# Patient Record
Sex: Male | Born: 1975
Health system: Southern US, Community
[De-identification: ages and names within clinical notes are randomized; demographics above are authoritative.]

## PROBLEM LIST (undated history)

## (undated) ENCOUNTER — Emergency Department (HOSPITAL_COMMUNITY): Admission: EM | Payer: Self-pay | Source: Home / Self Care

## (undated) DIAGNOSIS — J111 Influenza due to unidentified influenza virus with other respiratory manifestations: Secondary | ICD-10-CM

## (undated) DIAGNOSIS — I5022 Chronic systolic (congestive) heart failure: Secondary | ICD-10-CM

## (undated) DIAGNOSIS — I509 Heart failure, unspecified: Secondary | ICD-10-CM

## (undated) DIAGNOSIS — B20 Human immunodeficiency virus [HIV] disease: Secondary | ICD-10-CM

## (undated) DIAGNOSIS — E119 Type 2 diabetes mellitus without complications: Secondary | ICD-10-CM

## (undated) DIAGNOSIS — I10 Essential (primary) hypertension: Secondary | ICD-10-CM

## (undated) HISTORY — PX: ANKLE SURGERY: SHX546

---

## 1999-05-13 ENCOUNTER — Emergency Department (HOSPITAL_COMMUNITY): Admission: EM | Admit: 1999-05-13 | Discharge: 1999-05-13 | Payer: Self-pay | Admitting: Emergency Medicine

## 1999-05-14 ENCOUNTER — Encounter: Payer: Self-pay | Admitting: Emergency Medicine

## 2013-12-06 ENCOUNTER — Emergency Department (HOSPITAL_COMMUNITY): Payer: Self-pay

## 2013-12-06 ENCOUNTER — Encounter (HOSPITAL_COMMUNITY): Payer: Self-pay | Admitting: Emergency Medicine

## 2013-12-06 ENCOUNTER — Inpatient Hospital Stay (HOSPITAL_COMMUNITY)
Admission: EM | Admit: 2013-12-06 | Discharge: 2013-12-10 | DRG: 292 | Disposition: A | Discharge status: Home / Self Care | Payer: Self-pay | Attending: Family Medicine | Admitting: Family Medicine

## 2013-12-06 DIAGNOSIS — E1169 Type 2 diabetes mellitus with other specified complication: Secondary | ICD-10-CM

## 2013-12-06 DIAGNOSIS — R0902 Hypoxemia: Secondary | ICD-10-CM | POA: Diagnosis present

## 2013-12-06 DIAGNOSIS — I5023 Acute on chronic systolic (congestive) heart failure: Principal | ICD-10-CM

## 2013-12-06 DIAGNOSIS — Z21 Asymptomatic human immunodeficiency virus [HIV] infection status: Secondary | ICD-10-CM | POA: Diagnosis present

## 2013-12-06 DIAGNOSIS — Z5900 Homelessness unspecified: Secondary | ICD-10-CM

## 2013-12-06 DIAGNOSIS — IMO0001 Reserved for inherently not codable concepts without codable children: Secondary | ICD-10-CM | POA: Diagnosis present

## 2013-12-06 DIAGNOSIS — Z7982 Long term (current) use of aspirin: Secondary | ICD-10-CM

## 2013-12-06 DIAGNOSIS — J40 Bronchitis, not specified as acute or chronic: Secondary | ICD-10-CM | POA: Diagnosis present

## 2013-12-06 DIAGNOSIS — Z8679 Personal history of other diseases of the circulatory system: Secondary | ICD-10-CM

## 2013-12-06 DIAGNOSIS — E1165 Type 2 diabetes mellitus with hyperglycemia: Secondary | ICD-10-CM

## 2013-12-06 DIAGNOSIS — I517 Cardiomegaly: Secondary | ICD-10-CM | POA: Diagnosis present

## 2013-12-06 DIAGNOSIS — B20 Human immunodeficiency virus [HIV] disease: Secondary | ICD-10-CM | POA: Diagnosis present

## 2013-12-06 DIAGNOSIS — J984 Other disorders of lung: Secondary | ICD-10-CM | POA: Diagnosis present

## 2013-12-06 DIAGNOSIS — J189 Pneumonia, unspecified organism: Secondary | ICD-10-CM | POA: Diagnosis present

## 2013-12-06 DIAGNOSIS — R0603 Acute respiratory distress: Secondary | ICD-10-CM | POA: Diagnosis present

## 2013-12-06 DIAGNOSIS — I10 Essential (primary) hypertension: Secondary | ICD-10-CM | POA: Diagnosis present

## 2013-12-06 DIAGNOSIS — E119 Type 2 diabetes mellitus without complications: Secondary | ICD-10-CM | POA: Diagnosis present

## 2013-12-06 DIAGNOSIS — F1210 Cannabis abuse, uncomplicated: Secondary | ICD-10-CM | POA: Diagnosis present

## 2013-12-06 DIAGNOSIS — F17200 Nicotine dependence, unspecified, uncomplicated: Secondary | ICD-10-CM | POA: Diagnosis present

## 2013-12-06 DIAGNOSIS — I428 Other cardiomyopathies: Secondary | ICD-10-CM | POA: Diagnosis present

## 2013-12-06 DIAGNOSIS — I509 Heart failure, unspecified: Secondary | ICD-10-CM | POA: Diagnosis present

## 2013-12-06 HISTORY — DX: Essential (primary) hypertension: I10

## 2013-12-06 HISTORY — DX: Heart failure, unspecified: I50.9

## 2013-12-06 HISTORY — DX: Type 2 diabetes mellitus without complications: E11.9

## 2013-12-06 HISTORY — DX: Human immunodeficiency virus (HIV) disease: B20

## 2013-12-06 HISTORY — DX: Chronic systolic (congestive) heart failure: I50.22

## 2013-12-06 LAB — BLOOD GAS, ARTERIAL
ACID-BASE EXCESS: 0.6 mmol/L (ref 0.0–2.0)
Bicarbonate: 23.7 mEq/L (ref 20.0–24.0)
Drawn by: 40662
O2 Saturation: 99.6 %
PCO2 ART: 31.2 mmHg — AB (ref 35.0–45.0)
PO2 ART: 165 mmHg — AB (ref 80.0–100.0)
Patient temperature: 98.6
TCO2: 24.6 mmol/L (ref 0–100)
pH, Arterial: 7.492 — ABNORMAL HIGH (ref 7.350–7.450)

## 2013-12-06 LAB — GLUCOSE, CAPILLARY
Glucose-Capillary: 279 mg/dL — ABNORMAL HIGH (ref 70–99)
Glucose-Capillary: 170 mg/dL — ABNORMAL HIGH (ref 70–99)

## 2013-12-06 LAB — URINALYSIS, ROUTINE W REFLEX MICROSCOPIC
Bilirubin Urine: NEGATIVE
Hgb urine dipstick: NEGATIVE
Ketones, ur: 15 mg/dL — AB
Leukocytes, UA: NEGATIVE
Nitrite: NEGATIVE
SPECIFIC GRAVITY, URINE: 1.022 (ref 1.005–1.030)
Urobilinogen, UA: 1 mg/dL (ref 0.0–1.0)
pH: 7.5 (ref 5.0–8.0)

## 2013-12-06 LAB — TROPONIN I: Troponin I: <0.3 ng/mL (ref <0.30)

## 2013-12-06 LAB — COMPREHENSIVE METABOLIC PANEL
ALT: 47 U/L (ref 0–53)
AST: 48 U/L — ABNORMAL HIGH (ref 0–37)
Albumin: 3.3 g/dL — ABNORMAL LOW (ref 3.5–5.2)
Alkaline Phosphatase: 82 U/L (ref 39–117)
Anion gap: 14 (ref 5–15)
BUN: 7 mg/dL (ref 6–23)
CALCIUM: 8.7 mg/dL (ref 8.4–10.5)
CO2: 26 meq/L (ref 19–32)
Chloride: 96 mEq/L (ref 96–112)
Creatinine, Ser: 0.82 mg/dL (ref 0.50–1.35)
GFR calc non Af Amer: >90 mL/min (ref >90)
GLUCOSE: 202 mg/dL — AB (ref 70–99)
Potassium: 4.3 mEq/L (ref 3.7–5.3)
SODIUM: 136 meq/L — AB (ref 137–147)
Total Bilirubin: 0.5 mg/dL (ref 0.3–1.2)
Total Protein: 7.9 g/dL (ref 6.0–8.3)

## 2013-12-06 LAB — CBC WITH DIFFERENTIAL/PLATELET
Basophils Absolute: 0 10*3/uL (ref 0.0–0.1)
Basophils Relative: 0 % (ref 0–1)
EOS PCT: 0 % (ref 0–5)
Eosinophils Absolute: 0 10*3/uL (ref 0.0–0.7)
HEMATOCRIT: 38.7 % — AB (ref 39.0–52.0)
Hemoglobin: 13.5 g/dL (ref 13.0–17.0)
LYMPHS PCT: 13 % (ref 12–46)
Lymphs Abs: 0.7 10*3/uL (ref 0.7–4.0)
MCH: 33 pg (ref 26.0–34.0)
MCHC: 34.9 g/dL (ref 30.0–36.0)
MCV: 94.6 fL (ref 78.0–100.0)
MONO ABS: 0.6 10*3/uL (ref 0.1–1.0)
Monocytes Relative: 10 % (ref 3–12)
Neutro Abs: 4.4 10*3/uL (ref 1.7–7.7)
Neutrophils Relative %: 77 % (ref 43–77)
PLATELETS: 134 10*3/uL — AB (ref 150–400)
RBC: 4.09 MIL/uL — ABNORMAL LOW (ref 4.22–5.81)
RDW: 12.5 % (ref 11.5–15.5)
WBC: 5.7 10*3/uL (ref 4.0–10.5)

## 2013-12-06 LAB — URINE MICROSCOPIC-ADD ON

## 2013-12-06 LAB — INFLUENZA PANEL BY PCR (TYPE A & B)
H1N1 flu by pcr: NOT DETECTED
INFLAPCR: NEGATIVE
INFLBPCR: NEGATIVE

## 2013-12-06 LAB — PRO B NATRIURETIC PEPTIDE: Pro B Natriuretic peptide (BNP): 1289 pg/mL — ABNORMAL HIGH (ref 0–125)

## 2013-12-06 LAB — STREP PNEUMONIAE URINARY ANTIGEN: STREP PNEUMO URINARY ANTIGEN: NEGATIVE

## 2013-12-06 LAB — APTT: aPTT: 31 seconds (ref 24–37)

## 2013-12-06 LAB — I-STAT TROPONIN, ED: TROPONIN I, POC: 0.02 ng/mL (ref 0.00–0.08)

## 2013-12-06 LAB — I-STAT CG4 LACTIC ACID, ED: Lactic Acid, Venous: 1.95 mmol/L (ref 0.5–2.2)

## 2013-12-06 LAB — EXPECTORATED SPUTUM ASSESSMENT W GRAM STAIN, RFLX TO RESP C

## 2013-12-06 LAB — MAGNESIUM: MAGNESIUM: 1.3 mg/dL — AB (ref 1.5–2.5)

## 2013-12-06 LAB — PROTIME-INR
INR: 1.05 (ref 0.00–1.49)
Prothrombin Time: 13.7 seconds (ref 11.6–15.2)

## 2013-12-06 MED ORDER — INSULIN ASPART 100 UNIT/ML ~~LOC~~ SOLN
0.0000 [IU] | Freq: Three times a day (TID) | SUBCUTANEOUS | Status: DC
  Administered 2013-12-06: 8 [IU] via SUBCUTANEOUS
  Administered 2013-12-07 (×2): 5 [IU] via SUBCUTANEOUS
  Administered 2013-12-07: 8 [IU] via SUBCUTANEOUS
  Administered 2013-12-08: 5 [IU] via SUBCUTANEOUS
  Administered 2013-12-08: 8 [IU] via SUBCUTANEOUS
  Administered 2013-12-08: 5 [IU] via SUBCUTANEOUS
  Administered 2013-12-09: 3 [IU] via SUBCUTANEOUS
  Administered 2013-12-09: 5 [IU] via SUBCUTANEOUS
  Administered 2013-12-09: 11 [IU] via SUBCUTANEOUS
  Administered 2013-12-10: 3 [IU] via SUBCUTANEOUS
  Administered 2013-12-10: 5 [IU] via SUBCUTANEOUS

## 2013-12-06 MED ORDER — GUAIFENESIN ER 600 MG PO TB12
1200.0000 mg | ORAL_TABLET | Freq: Two times a day (BID) | ORAL | Status: DC
  Administered 2013-12-06 – 2013-12-10 (×8): 1200 mg via ORAL
  Filled 2013-12-06 (×10): qty 2

## 2013-12-06 MED ORDER — SODIUM CHLORIDE 0.9 % IV SOLN
INTRAVENOUS | Status: AC
  Administered 2013-12-06: 13:00:00 via INTRAVENOUS
  Administered 2013-12-07: 1000 mL via INTRAVENOUS

## 2013-12-06 MED ORDER — SODIUM CHLORIDE 0.9 % IV SOLN: Freq: Once | INTRAVENOUS | Status: DC

## 2013-12-06 MED ORDER — SODIUM CHLORIDE 0.9 % IV SOLN
Freq: Once | INTRAVENOUS | Status: AC
  Administered 2013-12-06: 12:00:00 via INTRAVENOUS

## 2013-12-06 MED ORDER — DEXTROSE 5 % IV SOLN
1.0000 g | Freq: Once | INTRAVENOUS | Status: AC
  Administered 2013-12-06: 1 g via INTRAVENOUS
  Filled 2013-12-06: qty 10

## 2013-12-06 MED ORDER — DEXTROSE 5 % IV SOLN
500.0000 mg | INTRAVENOUS | Status: DC
  Administered 2013-12-07: 500 mg via INTRAVENOUS
  Filled 2013-12-06: qty 500

## 2013-12-06 MED ORDER — LABETALOL HCL 5 MG/ML IV SOLN
20.0000 mg | Freq: Once | INTRAVENOUS | Status: AC
  Administered 2013-12-06: 20 mg via INTRAVENOUS
  Filled 2013-12-06: qty 4

## 2013-12-06 MED ORDER — HYDROCHLOROTHIAZIDE 25 MG PO TABS
25.0000 mg | ORAL_TABLET | Freq: Every day | ORAL | Status: DC
  Administered 2013-12-06: 25 mg via ORAL
  Filled 2013-12-06 (×2): qty 1

## 2013-12-06 MED ORDER — DEXTROSE 5 % IV SOLN
500.0000 mg | Freq: Once | INTRAVENOUS | Status: AC
  Administered 2013-12-06: 500 mg via INTRAVENOUS
  Filled 2013-12-06: qty 500

## 2013-12-06 MED ORDER — SODIUM CHLORIDE 0.9 % IV BOLUS (SEPSIS): 500.0000 mL | Freq: Once | INTRAVENOUS | Status: DC

## 2013-12-06 MED ORDER — IPRATROPIUM-ALBUTEROL 0.5-2.5 (3) MG/3ML IN SOLN: 3.0000 mL | Freq: Four times a day (QID) | RESPIRATORY_TRACT | Status: DC

## 2013-12-06 MED ORDER — DEXTROSE 5 % IV SOLN
1.0000 g | INTRAVENOUS | Status: DC
  Administered 2013-12-07: 1 g via INTRAVENOUS
  Filled 2013-12-06: qty 10

## 2013-12-06 MED ORDER — CETYLPYRIDINIUM CHLORIDE 0.05 % MT LIQD
7.0000 mL | Freq: Two times a day (BID) | OROMUCOSAL | Status: DC
  Administered 2013-12-06 – 2013-12-09 (×6): 7 mL via OROMUCOSAL

## 2013-12-06 MED ORDER — ACETAMINOPHEN 325 MG PO TABS: 650.0000 mg | ORAL_TABLET | Freq: Four times a day (QID) | ORAL | Status: DC | PRN

## 2013-12-06 MED ORDER — FAMOTIDINE 20 MG PO TABS
20.0000 mg | ORAL_TABLET | Freq: Every day | ORAL | Status: DC
  Administered 2013-12-06 – 2013-12-10 (×5): 20 mg via ORAL
  Filled 2013-12-06 (×5): qty 1

## 2013-12-06 MED ORDER — ONDANSETRON HCL 4 MG/2ML IJ SOLN: 4.0000 mg | Freq: Four times a day (QID) | INTRAMUSCULAR | Status: DC | PRN

## 2013-12-06 MED ORDER — GI COCKTAIL ~~LOC~~
30.0000 mL | Freq: Three times a day (TID) | ORAL | Status: DC | PRN
  Filled 2013-12-06: qty 30

## 2013-12-06 MED ORDER — IPRATROPIUM-ALBUTEROL 0.5-2.5 (3) MG/3ML IN SOLN
3.0000 mL | RESPIRATORY_TRACT | Status: DC | PRN
  Administered 2013-12-06: 3 mL via RESPIRATORY_TRACT
  Filled 2013-12-06: qty 3

## 2013-12-06 MED ORDER — HYDRALAZINE HCL 20 MG/ML IJ SOLN
10.0000 mg | Freq: Four times a day (QID) | INTRAMUSCULAR | Status: DC | PRN
  Administered 2013-12-06: 10 mg via INTRAVENOUS
  Filled 2013-12-06: qty 1

## 2013-12-06 MED ORDER — SULFAMETHOXAZOLE-TRIMETHOPRIM 400-80 MG/5ML IV SOLN
480.0000 mg | Freq: Three times a day (TID) | INTRAVENOUS | Status: DC
  Administered 2013-12-06 – 2013-12-07 (×3): 480 mg via INTRAVENOUS
  Filled 2013-12-06 (×7): qty 30

## 2013-12-06 MED ORDER — ENOXAPARIN SODIUM 40 MG/0.4ML ~~LOC~~ SOLN
40.0000 mg | SUBCUTANEOUS | Status: DC
  Administered 2013-12-06 – 2013-12-09 (×4): 40 mg via SUBCUTANEOUS
  Filled 2013-12-06 (×5): qty 0.4

## 2013-12-06 MED ORDER — MAGNESIUM SULFATE 4000MG/100ML IJ SOLN
4.0000 g | Freq: Once | INTRAMUSCULAR | Status: AC
  Administered 2013-12-06: 4 g via INTRAVENOUS
  Filled 2013-12-06: qty 100

## 2013-12-06 MED ORDER — SODIUM CHLORIDE 0.9 % IV BOLUS (SEPSIS)
500.0000 mL | Freq: Once | INTRAVENOUS | Status: AC
  Administered 2013-12-06: 500 mL via INTRAVENOUS

## 2013-12-06 MED ORDER — METOPROLOL TARTRATE 25 MG PO TABS
25.0000 mg | ORAL_TABLET | Freq: Two times a day (BID) | ORAL | Status: DC
  Administered 2013-12-06 – 2013-12-09 (×6): 25 mg via ORAL
  Filled 2013-12-06 (×7): qty 1

## 2013-12-06 MED ORDER — PREDNISONE 20 MG PO TABS
40.0000 mg | ORAL_TABLET | Freq: Two times a day (BID) | ORAL | Status: DC
  Administered 2013-12-06 – 2013-12-07 (×3): 40 mg via ORAL
  Filled 2013-12-06 (×6): qty 2

## 2013-12-06 NOTE — Progress Notes (Signed)
9935 In no acute distress. Dozing . Denied SOB

## 2013-12-06 NOTE — ED Provider Notes (Signed)
CSN: [id number]     Arrival date & time 12/06/13  1751 History   First MD Initiated Contact with Patient 12/06/13 [phone]     Chief Complaint  Patient presents with  . Cough  . Chest Pain      HPI  Patient presents for evaluation of difficulty breathing. Has a history that he recently moved here from [place]. He is homeless. Has a friend that accompanies him here. Staying in a shelter. He's had a cough productive of some bloody sputum for 4-5 days. More short of breath today. States he always has "a little bit" of edema in his legs. History of being HIV positive. States he's never been treated or medicated for this. Also history of hypertension and denies any current medications for this as well.   Past Medical History  Diagnosis Date  . HIV (human immunodeficiency virus infection)   . Diabetes mellitus without complication   . Hypertension   . Chronic systolic heart failure   . DM2 (diabetes mellitus, type 2)     a. EF 15-20%, grade II DD, LA mild/mod dilated   Past Surgical History  Procedure Laterality Date  . Ankle surgery Right    Family History  Problem Relation Age of Onset  . Other Father    History  Substance Use Topics  . Smoking status: Current Every Day Smoker -- 0.25 packs/day    Types: Cigarettes  . Smokeless tobacco: Not on file  . Alcohol Use: Yes     Comment: socially about 12 pack a weekend    Review of Systems  Constitutional: Negative for fever, chills, diaphoresis, appetite change and fatigue.  HENT: Negative for mouth sores, sore throat and trouble swallowing.   Eyes: Negative for visual disturbance.  Respiratory: Positive for cough and shortness of breath. Negative for chest tightness and wheezing.        Hemoptysis  Cardiovascular: Positive for leg swelling. Negative for chest pain.  Gastrointestinal: Negative for nausea, vomiting, abdominal pain, diarrhea and abdominal distention.  Endocrine: Negative for polydipsia, polyphagia and polyuria.   Genitourinary: Negative for dysuria, frequency and hematuria.  Musculoskeletal: Negative for gait problem.  Skin: Negative for color change, pallor and rash.  Neurological: Negative for dizziness, syncope, light-headedness and headaches.  Hematological: Does not bruise/bleed easily.  Psychiatric/Behavioral: Negative for behavioral problems and confusion.      Allergies  Review of patient's allergies indicates no known allergies.  Home Medications   Prior to Admission medications   Medication Sig Start Date End Date Taking? Authorizing Provider  aspirin 325 MG tablet Take 650 mg by mouth every 6 (six) hours as needed for mild pain.   Yes Historical Provider, MD  carvedilol (COREG) 6.25 MG tablet Take 1 tablet (6.25 mg total) by mouth 2 (two) times daily with a meal. 12/10/13   Velvet Bathe, MD  dolutegravir (TIVICAY) 50 MG tablet Take 1 tablet (50 mg total) by mouth daily. 12/10/13   Velvet Bathe, MD  emtricitabine-tenofovir (TRUVADA) 200-300 MG per tablet Take 1 tablet by mouth daily. 12/10/13   Velvet Bathe, MD  hydrALAZINE (APRESOLINE) 25 MG tablet Take 1 tablet (25 mg total) by mouth every 8 (eight) hours. 12/10/13   Velvet Bathe, MD  isosorbide mononitrate (IMDUR) 30 MG 24 hr tablet Take 1 tablet (30 mg total) by mouth daily. 12/10/13   Velvet Bathe, MD  lisinopril (PRINIVIL,ZESTRIL) 10 MG tablet Take 1 tablet (10 mg total) by mouth daily. 12/10/13   Velvet Bathe, MD  metFORMIN (GLUCOPHAGE) 500  MG tablet Take 1 tablet (500 mg total) by mouth 2 (two) times daily with a meal. 12/10/13   Velvet Bathe, MD   BP 140/94  Pulse 90  Temp(Src) 98.2 F (36.8 C) (Oral)  Resp 18  Ht _0  (1.651 m)  Wt 197 lb 1.6 oz (89.404 kg)  BMI 32.80 kg/m2  SpO2 100% Physical Exam  Constitutional:  Is tachypneic. He appears ill. He is awake alert and mentating well.  Cardiovascular:  Sinus tachycardia.  Pulmonary/Chest:  Use rhonchorous breath sounds. Slight tachypnea. Crackles diffusely to the right lung  from the scapula and inferiorly.    ED Course  Procedures (including critical care time) Labs Review Labs Reviewed  CBC WITH DIFFERENTIAL - Abnormal; Notable for the following:    RBC 4.09 (*)    HCT 38.7 (*)    Platelets 134 (*)    All other components within normal limits  COMPREHENSIVE METABOLIC PANEL - Abnormal; Notable for the following:    Sodium 136 (*)    Glucose, Bld 202 (*)    Albumin 3.3 (*)    AST 48 (*)    All other components within normal limits  PRO B NATRIURETIC PEPTIDE - Abnormal; Notable for the following:    Pro B Natriuretic peptide (BNP) 1289.0 (*)    All other components within normal limits  URINALYSIS, ROUTINE W REFLEX MICROSCOPIC - Abnormal; Notable for the following:    Glucose, UA >1000 (*)    Ketones, ur 15 (*)    Protein, ur >300 (*)    All other components within normal limits  MAGNESIUM - Abnormal; Notable for the following:    Magnesium 1.3 (*)    All other components within normal limits  HIV-1 RNA ULTRAQUANT REFLEX TO GENTYP+ - Abnormal; Notable for the following:    HIV 1 RNA Quant 10387 (*)    HIV1 RNA Quant, Log 4.02 (*)    All other components within normal limits  T-HELPER CELLS (CD4) COUNT - Abnormal; Notable for the following:    CD4 T Cell Abs 270 (*)    CD4 % Helper T Cell 29 (*)    All other components within normal limits  HEMOGLOBIN A1C - Abnormal; Notable for the following:    Hemoglobin A1C 9.7 (*)    Mean Plasma Glucose 232 (*)    All other components within normal limits  GLUCOSE, CAPILLARY - Abnormal; Notable for the following:    Glucose-Capillary 279 (*)    All other components within normal limits  BLOOD GAS, ARTERIAL - Abnormal; Notable for the following:    pH, Arterial 7.492 (*)    pCO2 arterial 31.2 (*)    pO2, Arterial 165.0 (*)    All other components within normal limits  COMPREHENSIVE METABOLIC PANEL - Abnormal; Notable for the following:    Sodium 127 (*)    Chloride 90 (*)    Glucose, Bld 266 (*)     Albumin 2.9 (*)    All other components within normal limits  CBC WITH DIFFERENTIAL - Abnormal; Notable for the following:    RBC 4.13 (*)    HCT 38.9 (*)    Platelets 131 (*)    Neutrophils Relative % 80 (*)    All other components within normal limits  GLUCOSE, CAPILLARY - Abnormal; Notable for the following:    Glucose-Capillary 170 (*)    All other components within normal limits  GLUCOSE, CAPILLARY - Abnormal; Notable for the following:    Glucose-Capillary 233 (*)  All other components within normal limits  GLUCOSE, CAPILLARY - Abnormal; Notable for the following:    Glucose-Capillary 212 (*)    All other components within normal limits  GLUCOSE, CAPILLARY - Abnormal; Notable for the following:    Glucose-Capillary 261 (*)    All other components within normal limits  BASIC METABOLIC PANEL - Abnormal; Notable for the following:    Sodium 130 (*)    Chloride 95 (*)    Glucose, Bld 214 (*)    GFR calc non Af Amer 84 (*)    All other components within normal limits  GLUCOSE, CAPILLARY - Abnormal; Notable for the following:    Glucose-Capillary 369 (*)    All other components within normal limits  GLUCOSE, CAPILLARY - Abnormal; Notable for the following:    Glucose-Capillary 248 (*)    All other components within normal limits  GLUCOSE, CAPILLARY - Abnormal; Notable for the following:    Glucose-Capillary 231 (*)    All other components within normal limits  BASIC METABOLIC PANEL - Abnormal; Notable for the following:    Sodium 132 (*)    Glucose, Bld 229 (*)    GFR calc non Af Amer 88 (*)    All other components within normal limits  GLUCOSE, CAPILLARY - Abnormal; Notable for the following:    Glucose-Capillary 296 (*)    All other components within normal limits  CBC - Abnormal; Notable for the following:    RBC 4.11 (*)    HCT 38.8 (*)    Platelets 122 (*)    All other components within normal limits  GLUCOSE, CAPILLARY - Abnormal; Notable for the following:     Glucose-Capillary 187 (*)    All other components within normal limits  GLUCOSE, CAPILLARY - Abnormal; Notable for the following:    Glucose-Capillary 198 (*)    All other components within normal limits  GLUCOSE, CAPILLARY - Abnormal; Notable for the following:    Glucose-Capillary 235 (*)    All other components within normal limits  GLUCOSE, CAPILLARY - Abnormal; Notable for the following:    Glucose-Capillary 318 (*)    All other components within normal limits  BASIC METABOLIC PANEL - Abnormal; Notable for the following:    Sodium 134 (*)    Glucose, Bld 233 (*)    GFR calc non Af Amer 87 (*)    All other components within normal limits  GLUCOSE, CAPILLARY - Abnormal; Notable for the following:    Glucose-Capillary 288 (*)    All other components within normal limits  GLUCOSE, CAPILLARY - Abnormal; Notable for the following:    Glucose-Capillary 210 (*)    All other components within normal limits  GLUCOSE, CAPILLARY - Abnormal; Notable for the following:    Glucose-Capillary 198 (*)    All other components within normal limits  CULTURE, BLOOD (ROUTINE X 2)  CULTURE, BLOOD (ROUTINE X 2)  URINE CULTURE  CULTURE, EXPECTORATED SPUTUM-ASSESSMENT  GRAM STAIN  PNEUMOCYSTIS JIROVECI SMEAR BY DFA  INFLUENZA PANEL BY PCR (TYPE A & B, H1N1)  LEGIONELLA ANTIGEN, URINE  STREP PNEUMONIAE URINARY ANTIGEN  PROTIME-INR  APTT  TROPONIN I  TROPONIN I  TROPONIN I  URINE MICROSCOPIC-ADD ON  MAGNESIUM  RPR  HEPATITIS PANEL, ACUTE  HLA B*5701  HIV-1 INTEGRASE GENOTYPE  HLA B*5701  I-STAT CG4 LACTIC ACID, ED  I-STAT TROPOININ, ED    Imaging Review No results found.   EKG Interpretation   Date/Time:  Saturday December 06 2013 08:35:59 EDT  Ventricular Rate:  104 PR Interval:  157 QRS Duration: 110 QT Interval:  408 QTC Calculation: 537 R Axis:   -12 Text Interpretation:  Sinus tachycardia Ventricular premature complex LVH  with secondary repolarization abnormality Prolonged  QT interval Confirmed  by Jeneen Rinks  MD, [place] ([id number]) on 12/06/2013 9:12:29 AM      MDM   Final diagnoses:  Acute respiratory distress  CAP (community acquired pneumonia)  Malignant hypertension  HIV (human immunodeficiency virus infection)  Type 2 diabetes mellitus with hyperglycemia  Type 2 diabetes mellitus without complication  H/O CHF  Homeless  Cardiomegaly  Type 2 diabetes mellitus with other specified complication  Acute on chronic systolic heart failure  Cardiomegaly: per cxr 12/06/13    X-rays subtle but present right lower lobe infiltrate. On exam he has crackles at the right base. Tachypneic but only a mild hypoxemia at 92-93% on room air. WBC 5.7. Elevated BNP at 1289. But no CHF on chest x-ray.  Plan will be inpatient treatment antibiotics for a require pneumonia.   Tanna Furry, MD 12/10/13 [phone]

## 2013-12-06 NOTE — ED Notes (Signed)
Attempted report 

## 2013-12-06 NOTE — Progress Notes (Signed)
ABG ordered in ED was not collected. Original ABG order cancelled, then ABG re-ordered when pt arrived to floor per protocol. RT will continue to monitor.

## 2013-12-06 NOTE — Progress Notes (Signed)
ANTIBIOTIC CONSULT NOTE - INITIAL  Pharmacy Consult for Septra Indication: Pneumocystis pneumonia  No Known Allergies  Patient Measurements: Height: 5\' 5"  (165.1 cm) Weight: 210 lb (95.255 kg) IBW/kg (Calculated) : 61.5   Vital Signs: Temp: 98.6 F (37 C) (08/29 0839) BP: 149/121 mmHg (08/29 1330) Pulse Rate: 100 (08/29 1330) Intake/Output from previous day:   Intake/Output from this shift:    Labs:  Recent Labs  12/06/13 0920  WBC 5.7  HGB 13.5  PLT 134*  CREATININE 0.82   Estimated Creatinine Clearance: 132.1 ml/min (by C-G formula based on Cr of 0.82). No results found for this basename: VANCOTROUGH, VANCOPEAK, VANCORANDOM, GENTTROUGH, GENTPEAK, GENTRANDOM, TOBRATROUGH, TOBRAPEAK, TOBRARND, AMIKACINPEAK, AMIKACINTROU, AMIKACIN,  in the last 72 hours   Microbiology: No results found for this or any previous visit (from the past 720 hour(s)).  Medical History: Past Medical History  Diagnosis Date  . HIV (human immunodeficiency virus infection)   . Diabetes mellitus without complication   . Hypertension   . CHF (congestive heart failure)   . DM2 (diabetes mellitus, type 2) 12/06/2013  . H/O CHF 12/06/2013    Medications:  Aspirin prn Assessment: 38 year old man with a history of HIV not currently on HAART to start on Septra IV for possible pneumocystis pneumonia.  Renal function is normal.  Goal of Therapy:  treat pneumonia  Plan:  Septra 480mg  (5mg /kg based on trimethoprim component) IV q8h Follow clinical progress and ability to take oral meds  Mickeal Skinner 12/06/2013,1:44 PM

## 2013-12-06 NOTE — ED Notes (Addendum)
Pt via EMS.  Just moved here from [place] and is living in a shelter.   C/o sob on exertion, productive cough with sputum.  Hypertensive 210/110, hr 104.  CBG 214.  Pt has not taken any of his medications x 3 months.

## 2013-12-06 NOTE — H&P (Signed)
[facility] History and Physical  Lelan Cush ZOX:[id number] DOB: October 15, 1975 DOA: 12/06/2013  Referring physician: Dr. Rolland Porter PCP: No primary provider on file.   Chief Complaint: Cough/shortness of breath  HPI: Shaun Brennan is a 38 y.o. male  With a history of HIV diagnosed approximately 10 years ago not been on any medication, diabetes mellitus, hypertension, history of CHF who states has been out of his medications for approximately a month recently moved from [place] to [place] one day prior to admission currently homeless, and presented to the emergency room with a one-month history of a productive cough occasionally clear sometimes yellowish and one episode of bloody, shortness of breath, chest pain associated with hacking cough. Patient stated that his symptoms worsened and subsequently presented to the ED. Patient denies any fevers, no chills, no abdominal pain, no diarrhea, no constipation, no dysuria, no melena, no hematemesis, no hematochezia. Patient does endorse congestion, nausea, generalized weakness. Patient denies any emesis. Patient was seen in the emergency room, comprehensive metabolic profile done at a sodium of 136 glucose of 202 albumin of 3.3 AST of 48 otherwise was within normal limits. Pro BNP was elevated at 1289. Lactic acid level is within normal limits at 1.95. Point-of-care cardiac marker was negative. CBC obtained had a platelet of 134 otherwise was unremarkable. Chest x-ray which was done, showed cardiomegaly with mild right base infiltrate which cannot be excluded. Patient was given a dose of IV Rocephin and IV azithromycin in the emergency room. We were called to admit the patient for further evaluation and management.   Review of Systems: As per history of present illness otherwise negative. Constitutional:  No weight loss, night sweats, Fevers, chills, fatigue.  HEENT:  No headaches, Difficulty swallowing,Tooth/dental problems,Sore throat,    No sneezing, itching, ear ache, nasal congestion, post nasal drip,  Cardio-vascular:  No chest pain, Orthopnea, PND, swelling in lower extremities, anasarca, dizziness, palpitations  GI:  No heartburn, indigestion, abdominal pain, nausea, vomiting, diarrhea, change in bowel habits, loss of appetite  Resp:  No shortness of breath with exertion or at rest. No excess mucus, no productive cough, No non-productive cough, No coughing up of blood.No change in color of mucus.No wheezing.No chest wall deformity  Skin:  no rash or lesions.  GU:  no dysuria, change in color of urine, no urgency or frequency. No flank pain.  Musculoskeletal:  No joint pain or swelling. No decreased range of motion. No back pain.  Psych:  No change in mood or affect. No depression or anxiety. No memory loss.   Past Medical History  Diagnosis Date  . HIV (human immunodeficiency virus infection)   . Diabetes mellitus without complication   . Hypertension   . CHF (congestive heart failure)   . DM2 (diabetes mellitus, type 2) 12/06/2013  . H/O CHF 12/06/2013   Past Surgical History  Procedure Laterality Date  . Ankle surgery Right    Social History:  reports that he has been smoking Cigarettes.  He has been smoking about 0.25 packs per day. He does not have any smokeless tobacco history on file. He reports that he drinks alcohol. He reports that he uses illicit drugs (Marijuana).  No Known Allergies  History reviewed. No pertinent family history.   Prior to Admission medications   Medication Sig Start Date End Date Taking? Authorizing Provider  aspirin 325 MG tablet Take 650 mg by mouth every 6 (six) hours as needed for mild pain.   Yes Historical Provider, MD   Physical  Exam: Filed Vitals:   12/06/13 1240 12/06/13 1245 12/06/13 1250 12/06/13 1255  BP: 159/111 165/115 165/112 161/109  Pulse: 93 96 94 96  Temp:      Resp: 18 22 18 16   SpO2: 97% 94% 96% 97%    Wt Readings from Last 3 Encounters:  No  data found for Wt    General:  Well-developed well-nourished in no acute cardiopulmonary distress. Speaking in full sentences. However noted to desat into the 80s on ambulation on room. Eyes: PERRLA, EOMI, normal lids, irises & conjunctiva ENT: grossly normal hearing, lips & tongue Neck: no LAD, masses or thyromegaly Cardiovascular: Tachycardia, no m/r/g. Trace bilateral LE edema right greater than left Telemetry: Normal sinus rhythm Respiratory: Some coarse breath sounds in the right base otherwise clear. No wheezing. Normal respiratory effort. Abdomen: soft, ntnd, +bs, no rebound, no guarding. Skin: no rash or induration seen on limited exam Musculoskeletal: 5/5 BUE strength, 4/5 BLE  Psychiatric: grossly normal mood and affect, speech fluent and appropriate Neurologic: Alert and oriented x3. Cranial nerves II through XII are grossly intact. Sensation is intact. Visual fields are intact. No focal deficits.           Labs on Admission:  Basic Metabolic Panel:  Recent Labs Lab 12/06/13 0920  NA 136*  K 4.3  CL 96  CO2 26  GLUCOSE 202*  BUN 7  CREATININE 0.82  CALCIUM 8.7   Liver Function Tests:  Recent Labs Lab 12/06/13 0920  AST 48*  ALT 47  ALKPHOS 82  BILITOT 0.5  PROT 7.9  ALBUMIN 3.3*   No results found for this basename: LIPASE, AMYLASE,  in the last 168 hours No results found for this basename: AMMONIA,  in the last 168 hours CBC:  Recent Labs Lab 12/06/13 0920  WBC 5.7  NEUTROABS 4.4  HGB 13.5  HCT 38.7*  MCV 94.6  PLT 134*   Cardiac Enzymes: No results found for this basename: CKTOTAL, CKMB, CKMBINDEX, TROPONINI,  in the last 168 hours  BNP (last 3 results)  Recent Labs  12/06/13 0920  PROBNP 1289.0*   CBG: No results found for this basename: GLUCAP,  in the last 168 hours  Radiological Exams on Admission: Dg Chest 2 View  12/06/2013   CLINICAL DATA:  Cough and chest pain.  EXAM: CHEST  2 VIEW  COMPARISON:  None.  FINDINGS:  Mediastinum and hilar structures are normal. Cardiomegaly. Pulmonary vascularity is normal. Mild right base infiltrate cannot be excluded. No pleural effusion or pneumothorax.  IMPRESSION: 1. Mild right base infiltrate cannot be excluded. 2. Cardiomegaly, no evidence of congestive heart failure.   Electronically Signed   By: Maisie Fus  Register   On: 12/06/2013 10:42    EKG: Independently reviewed. Sinus tachycardia. LVH. Nonspecific ST-T wave abnormalities.  Assessment/Plan Principal Problem:   Acute respiratory distress Active Problems:   CAP (community acquired pneumonia)   Malignant hypertension   DM2 (diabetes mellitus, type 2)   HIV (human immunodeficiency virus infection)   H/O CHF   Cardiomegaly: per cxr 12/06/13  #1 acute respiratory distress with hypoxia/ CAP  Likely secondary to community-acquired pneumonia noted on chest x-ray. Patient is immunocompromised with a history of HIV over the past 10 years and not on any current medications. Patient noted to be hypoxic with sats in the 80s on room air. Also concern for atypical infections. Will admit the patient to telemetry. Check a sputum Gram stain and culture. Check a urine Legionella antigen. Check a urine pneumococcus  antigen. Check a PCP DFA. Check for influenza PCR. Check blood cultures x2. Check ABG. Will place on oxygen, Mucinex, IV Rocephin, IV azithromycin, incentive spirometry, nebulizer treatments. Will also place on prednisone 40 mg twice a day and IV Bactrim. ID consultation. Follow.  #2 malignant hypertension Patient states was on antihypertensive medications of atenolol however can't remember the dose or any other medications. Patient states he ran out of medications over the past month we'll place patient on Lopressor 25 mg twice daily. We'll also place on HCTZ 25 mg daily. IV hydralazine as needed.  #3 type 2 diabetes Check a hemoglobin A1c. Place on sliding scale insulin for now. Patient did state was on metformin in the  past.  #4 history of CHF/cardiomegaly Will check a 2-D echo.  #5 history of HIV x10 years per patient Check a CD4 count. Check a viral load. Will consult with ID for further evaluation and management.  #6 prophylaxis Pepcid for GI prophylaxis. Lovenox for DVT prophylaxis.   Code Status: Full DVT Prophylaxis: Lovenox Family Communication: Updated patient no family present. Disposition Plan: Admit to telemetry.  Time spent: 70 mins  [facility] MD [facility] Pager [phone]  **Disclaimer: This note may have been dictated with voice recognition software. Similar sounding words can inadvertently be transcribed and this note may contain transcription errors which may not have been corrected upon publication of note.**

## 2013-12-07 ENCOUNTER — Inpatient Hospital Stay (HOSPITAL_COMMUNITY): Payer: Self-pay

## 2013-12-07 ENCOUNTER — Encounter (HOSPITAL_COMMUNITY): Payer: Self-pay | Admitting: Radiology

## 2013-12-07 DIAGNOSIS — R05 Cough: Secondary | ICD-10-CM

## 2013-12-07 DIAGNOSIS — IMO0001 Reserved for inherently not codable concepts without codable children: Secondary | ICD-10-CM

## 2013-12-07 DIAGNOSIS — R0989 Other specified symptoms and signs involving the circulatory and respiratory systems: Secondary | ICD-10-CM

## 2013-12-07 DIAGNOSIS — I10 Essential (primary) hypertension: Secondary | ICD-10-CM

## 2013-12-07 DIAGNOSIS — I509 Heart failure, unspecified: Secondary | ICD-10-CM

## 2013-12-07 DIAGNOSIS — R059 Cough, unspecified: Secondary | ICD-10-CM

## 2013-12-07 DIAGNOSIS — R0902 Hypoxemia: Secondary | ICD-10-CM

## 2013-12-07 DIAGNOSIS — J9600 Acute respiratory failure, unspecified whether with hypoxia or hypercapnia: Secondary | ICD-10-CM

## 2013-12-07 DIAGNOSIS — E1165 Type 2 diabetes mellitus with hyperglycemia: Secondary | ICD-10-CM

## 2013-12-07 DIAGNOSIS — R0609 Other forms of dyspnea: Secondary | ICD-10-CM

## 2013-12-07 LAB — COMPREHENSIVE METABOLIC PANEL
ALK PHOS: 78 U/L (ref 39–117)
ALT: 35 U/L (ref 0–53)
AST: 35 U/L (ref 0–37)
Albumin: 2.9 g/dL — ABNORMAL LOW (ref 3.5–5.2)
Anion gap: 15 (ref 5–15)
BUN: 9 mg/dL (ref 6–23)
CO2: 22 meq/L (ref 19–32)
Calcium: 8.8 mg/dL (ref 8.4–10.5)
Chloride: 90 mEq/L — ABNORMAL LOW (ref 96–112)
Creatinine, Ser: 0.86 mg/dL (ref 0.50–1.35)
GFR calc Af Amer: >90 mL/min (ref >90)
GFR calc non Af Amer: >90 mL/min (ref >90)
GLUCOSE: 266 mg/dL — AB (ref 70–99)
POTASSIUM: 4.4 meq/L (ref 3.7–5.3)
SODIUM: 127 meq/L — AB (ref 137–147)
TOTAL PROTEIN: 7.9 g/dL (ref 6.0–8.3)
Total Bilirubin: 0.4 mg/dL (ref 0.3–1.2)

## 2013-12-07 LAB — LEGIONELLA ANTIGEN, URINE: LEGIONELLA ANTIGEN, URINE: NEGATIVE

## 2013-12-07 LAB — GLUCOSE, CAPILLARY
GLUCOSE-CAPILLARY: 212 mg/dL — AB (ref 70–99)
GLUCOSE-CAPILLARY: 261 mg/dL — AB (ref 70–99)
Glucose-Capillary: 233 mg/dL — ABNORMAL HIGH (ref 70–99)
Glucose-Capillary: 369 mg/dL — ABNORMAL HIGH (ref 70–99)

## 2013-12-07 LAB — CBC WITH DIFFERENTIAL/PLATELET
BASOS PCT: 0 % (ref 0–1)
Basophils Absolute: 0 10*3/uL (ref 0.0–0.1)
EOS ABS: 0 10*3/uL (ref 0.0–0.7)
EOS PCT: 0 % (ref 0–5)
HCT: 38.9 % — ABNORMAL LOW (ref 39.0–52.0)
HEMOGLOBIN: 13.6 g/dL (ref 13.0–17.0)
LYMPHS ABS: 0.8 10*3/uL (ref 0.7–4.0)
Lymphocytes Relative: 14 % (ref 12–46)
MCH: 32.9 pg (ref 26.0–34.0)
MCHC: 35 g/dL (ref 30.0–36.0)
MCV: 94.2 fL (ref 78.0–100.0)
MONO ABS: 0.4 10*3/uL (ref 0.1–1.0)
MONOS PCT: 7 % (ref 3–12)
NEUTROS PCT: 80 % — AB (ref 43–77)
Neutro Abs: 4.7 10*3/uL (ref 1.7–7.7)
Platelets: 131 10*3/uL — ABNORMAL LOW (ref 150–400)
RBC: 4.13 MIL/uL — ABNORMAL LOW (ref 4.22–5.81)
RDW: 12.6 % (ref 11.5–15.5)
WBC: 5.8 10*3/uL (ref 4.0–10.5)

## 2013-12-07 LAB — URINE CULTURE

## 2013-12-07 LAB — TROPONIN I

## 2013-12-07 LAB — MAGNESIUM: Magnesium: 1.9 mg/dL (ref 1.5–2.5)

## 2013-12-07 LAB — HEMOGLOBIN A1C
Hgb A1c MFr Bld: 9.7 % — ABNORMAL HIGH (ref <5.7)
MEAN PLASMA GLUCOSE: 232 mg/dL — AB (ref <117)

## 2013-12-07 MED ORDER — IOHEXOL 350 MG/ML SOLN
80.0000 mL | Freq: Once | INTRAVENOUS | Status: AC | PRN
  Administered 2013-12-07: 80 mL via INTRAVENOUS

## 2013-12-07 MED ORDER — SODIUM CHLORIDE 0.9 % IV BOLUS (SEPSIS)
500.0000 mL | Freq: Once | INTRAVENOUS | Status: AC
  Administered 2013-12-07: 500 mL via INTRAVENOUS

## 2013-12-07 MED ORDER — DOLUTEGRAVIR SODIUM 50 MG PO TABS
50.0000 mg | ORAL_TABLET | Freq: Every day | ORAL | Status: DC
  Administered 2013-12-07 – 2013-12-10 (×4): 50 mg via ORAL
  Filled 2013-12-07 (×4): qty 1

## 2013-12-07 MED ORDER — EMTRICITABINE-TENOFOVIR DF 200-300 MG PO TABS
1.0000 | ORAL_TABLET | Freq: Every day | ORAL | Status: DC
  Administered 2013-12-07 – 2013-12-10 (×4): 1 via ORAL
  Filled 2013-12-07 (×4): qty 1

## 2013-12-07 NOTE — Progress Notes (Signed)
Patient has stat CT Angio chest ordered at 0935- Spoke with RN at 1010- Patient needs new IV access minimum 20g in the forearm, above the wrist. RN acknowledges, and will notify us when placed.  Patient ok to have clear liquids only until after CT completed

## 2013-12-07 NOTE — Progress Notes (Signed)
[facility] PROGRESS NOTE  Shaun Brennan XTA:[id number] DOB: 1976/03/22 DOA: 12/06/2013 PCP: No primary provider on file.  Assessment/Plan: #1 Community acquired pneumonia, bronchitis -mildly hypoxic -continue Rocephin/Zirthomax -continue bactrim IV, FU PCP DFA -mucinex, nebs -ID to see  -FU sputum Cx -urine legionella and Pneumo Ag negative   #2 malignant hypertension  -improved, continue metoprolol and hydralazine PRN  #3 DM 2/uncontrolled AiC 9.7, SSI for now, oral meds at DC  #4 history of CHF/cardiomegaly  -FU 2-D echo.   #5 history of HIV x10 years per patient  -reports never being on hAART -FU CD4 count and viral load -FU with ID  #6 Homelessness -CSW consult   DVT prooph: Lovenox   Code Status:Full Code Family Communication: none at bedside Disposition Plan: homeless, CSW consult  Consultants:  ID  HPI/Subjective: Still coughing a lot, congestion, chest discomfort  Objective: Filed Vitals:   12/07/13 0957  BP: 131/86  Pulse: 94  Temp:   Resp:     Intake/Output Summary (Last 24 hours) at 12/07/13 1053 Last data filed at 12/07/13 0832  Gross per 24 hour  Intake    924 ml  Output   2625 ml  Net  -1701 ml   Filed Weights   12/06/13 1313 12/06/13 1453 12/07/13 0428  Weight: 95.255 kg (210 lb) 91.264 kg (201 lb 3.2 oz) 90.674 kg (199 lb 14.4 oz)    Exam:   General:  AAOx3, ill appearing  Cardiovascular: S1S2/RRR  Respiratory: scattered ronchi  Abdomen: soft, Nt, SB present  Musculoskeletal:some chronic R ankle swelling  Data Reviewed: Basic Metabolic Panel:  Recent Labs Lab 12/06/13 0920 12/06/13 1405 12/07/13 0445  NA 136*  --  127*  K 4.3  --  4.4  CL 96  --  90*  CO2 26  --  22  GLUCOSE 202*  --  266*  BUN 7  --  9  CREATININE 0.82  --  0.86  CALCIUM 8.7  --  8.8  MG  --  1.3* 1.9   Liver Function Tests:  Recent Labs Lab 12/06/13 0920 12/07/13 0445  AST 48* 35  ALT 47 35  ALKPHOS 82 78  BILITOT 0.5  0.4  PROT 7.9 7.9  ALBUMIN 3.3* 2.9*   No results found for this basename: LIPASE, AMYLASE,  in the last 168 hours No results found for this basename: AMMONIA,  in the last 168 hours CBC:  Recent Labs Lab 12/06/13 0920 12/07/13 0445  WBC 5.7 5.8  NEUTROABS 4.4 4.7  HGB 13.5 13.6  HCT 38.7* 38.9*  MCV 94.6 94.2  PLT 134* 131*   Cardiac Enzymes:  Recent Labs Lab 12/06/13 1615 12/06/13 2200 12/07/13 0414  TROPONINI <0.30 <0.30 <0.30   BNP (last 3 results)  Recent Labs  12/06/13 0920  PROBNP 1289.0*   CBG:  Recent Labs Lab 12/06/13 1439 12/06/13 2226 12/07/13 0610  GLUCAP 279* 170* 233*    Recent Results (from the past 240 hour(s))  CULTURE, EXPECTORATED SPUTUM-ASSESSMENT     Status: None   Collection Time    12/06/13 10:35 PM      Result Value Ref Range Status   Specimen Description SPUTUM   Final   Special Requests NONE   Final   Sputum evaluation     Final   Value: MICROSCOPIC FINDINGS SUGGEST THAT THIS SPECIMEN IS NOT REPRESENTATIVE OF LOWER RESPIRATORY SECRETIONS. PLEASE RECOLLECT.     Results Called toNorton Pastel [phone] WilderK   Report Status 12/06/2013 FINAL  Final     Studies: Dg Chest 2 View  12/06/2013   CLINICAL DATA:  Cough and chest pain.  EXAM: CHEST  2 VIEW  COMPARISON:  None.  FINDINGS: Mediastinum and hilar structures are normal. Cardiomegaly. Pulmonary vascularity is normal. Mild right base infiltrate cannot be excluded. No pleural effusion or pneumothorax.  IMPRESSION: 1. Mild right base infiltrate cannot be excluded. 2. Cardiomegaly, no evidence of congestive heart failure.   Electronically Signed   By: Maisie Fus  Register   On: 12/06/2013 10:42    Scheduled Meds: . antiseptic oral rinse  7 mL Mouth Rinse BID  . azithromycin  500 mg Intravenous Q24H  . cefTRIAXone (ROCEPHIN)  IV  1 g Intravenous Q24H  . enoxaparin (LOVENOX) injection  40 mg Subcutaneous Q24H  . famotidine  20 mg Oral Daily  . guaiFENesin  1,200 mg Oral BID  .  insulin aspart  0-15 Units Subcutaneous TID WC  . metoprolol tartrate  25 mg Oral BID  . predniSONE  40 mg Oral BID WC  . sodium chloride  500 mL Intravenous Once  . sulfamethoxazole-trimethoprim  480 mg of trimethoprim Intravenous Q8H   Continuous Infusions: . sodium chloride 1,000 mL (12/07/13 0805)   Antibiotics Given (last 72 hours)   Date/Time Action Medication Dose Rate   12/06/13 1718 Given   sulfamethoxazole-trimethoprim (BACTRIM) 480 mg of trimethoprim in dextrose 5 % 500 mL IVPB 480 mg of trimethoprim 353.3 mL/hr   12/07/13 0100 Given   sulfamethoxazole-trimethoprim (BACTRIM) 480 mg of trimethoprim in dextrose 5 % 500 mL IVPB 480 mg of trimethoprim 353.3 mL/hr   12/07/13 0946 Given  [non on floor at scheduled time]   sulfamethoxazole-trimethoprim (BACTRIM) 480 mg of trimethoprim in dextrose 5 % 500 mL IVPB 480 mg of trimethoprim 353.3 mL/hr      Principal Problem:   Acute respiratory distress Active Problems:   CAP (community acquired pneumonia)   Malignant hypertension   DM2 (diabetes mellitus, type 2)   HIV (human immunodeficiency virus infection)   H/O CHF   Cardiomegaly: per cxr 12/06/13    Time spent:    [facility]  [facility] Pager [phone]. If 7PM-7AM, please contact night-coverage at [email or web address], password [facility] 12/07/2013, 10:53 AM  LOS: 1 day

## 2013-12-07 NOTE — Progress Notes (Signed)
Patient's RN called to advise patient has peripheral IV for CT @ 1230  Placed job in transport stat

## 2013-12-07 NOTE — Evaluation (Signed)
Occupational Therapy Evaluation Patient Details Name: Shaun Brennan MRN: [id number] DOB: 04/17/1975 Today's Date: 12/07/2013    History of Present Illness  (Pt. admitted with PNA. PMH:HTN, HIV 0 meds, DM, CHF.)   Clinical Impression   Pt. Is currently homeless and was I with mobility and with ADLs prior to admit. Pt. Is able to perform ADLs and mobility without A. Pt. Was having allover body tremors when he first stood and states this is normal for him. Pt. Tremors lasted a few seconds and then pt. Able to perform mobility tasks.Pt. Vision and B UE are WFL.  Pt. Does not want and does not require further skilled OT services.      Follow Up Recommendations  No OT follow up    Equipment Recommendations  None recommended by OT    Recommendations for Other Services       Precautions / Restrictions Precautions Precautions: None Restrictions Weight Bearing Restrictions: No      Mobility Bed Mobility Overal bed mobility: Independent                Transfers Overall transfer level: Independent                    Balance                                            ADL Overall ADL's : At baseline                                       General ADL Comments:  (no deficits.)     Vision                     Perception     Praxis      Pertinent Vitals/Pain Pain Assessment: No/denies pain     Hand Dominance     Extremity/Trunk Assessment Upper Extremity Assessment Upper Extremity Assessment: Overall WFL for tasks assessed           Communication Communication Communication: No difficulties   Cognition Arousal/Alertness: Awake/alert Behavior During Therapy: WFL for tasks assessed/performed Overall Cognitive Status: Within Functional Limits for tasks assessed                     General Comments       Exercises       Shoulder Instructions      Home Living Family/patient expects to be  discharged to:: Shelter/Homeless                                        Prior Functioning/Environment Level of Independence: Independent             OT Diagnosis:     OT Problem List:     OT Treatment/Interventions:      OT Goals(Current goals can be found in the care plan section) Acute Rehab OT Goals Patient Stated Goal:  (get out of hospital.)  OT Frequency:     Barriers to D/C:            Co-evaluation              End of Session  Activity Tolerance: Patient tolerated treatment well Patient left: in bed;with call bell/phone within reach   Time: 1035-1102 OT Time Calculation (min): 27 min Charges:  OT General Charges $OT Visit: 1 Procedure OT Evaluation $Initial OT Evaluation Tier I: 1 Procedure OT Treatments $Self Care/Home Management : 8-22 mins G-Codes:    Prisila Dlouhy Dec 11, 2013, 10:58 AM

## 2013-12-07 NOTE — Evaluation (Signed)
Physical Therapy Evaluation Patient Details Name: Shaun Brennan MRN: [id number] DOB: [birth year] Today's Date: 12/07/2013   History of Present Illness  With a history of HIV diagnosed approximately 10 years ago not been on any medication, diabetes mellitus, hypertension, history of CHF who states has been out of his medications for approximately a month recently moved from [place] to [place] one day prior to admission currently homeless, and presented to the emergency room with a one-month history of a productive cough occasionally clear sometimes yellowish and one episode of bloody, shortness of breath, chest pain associated with hacking cough.  Clinical Impression  Pt adm due to above. Evaluation limited due to transport arrival to take pt to CT scan. Pt able to ambulate in room, but required min guard (A) due to antalgic and unsteady gt. Pt c/o pain in rt ankle and reports he had surgery in ankle back in December. Will plan to follow pt per POC and maximize functional mobility prior to returning to [employer]. Plan to address gt stability with cane at next visit.     Follow Up Recommendations No PT follow up;Supervision - Intermittent    Equipment Recommendations  Cane    Recommendations for Other Services       Precautions / Restrictions Precautions Precaution Comments: droplet  Restrictions Weight Bearing Restrictions: No      Mobility  Bed Mobility Overal bed mobility: Independent                Transfers Overall transfer level: Needs assistance Equipment used: None Transfers: Sit to/from Stand Sit to Stand: Supervision         General transfer comment: ataxic like movement with inital standing; supervision for safety; no LOB noted  Ambulation/Gait Ambulation/Gait assistance: Min guard Ambulation Distance (Feet): 20 Feet Assistive device: 1 person hand held assist Gait Pattern/deviations: Step-through pattern;Decreased dorsiflexion - right;Decreased  stride length;Antalgic;Wide base of support Gait velocity: decreased Gait velocity interpretation: Below normal speed for age/gender General Gait Details: pt with antalgic gt due to chronic Rt ankle pain; is unsteady with gt and required HHA for safety and to balance; will recommend use of cane or RW to steady gt  Stairs            Wheelchair Mobility    Modified Rankin (Stroke Patients Only)       Balance Overall balance assessment: Needs assistance Sitting-balance support: Feet supported Sitting balance-Leahy Scale: Good     Standing balance support: During functional activity;No upper extremity supported Standing balance-Leahy Scale: Fair Standing balance comment: +sway; ataxic like; no LOB noted; denied any dizziness                             Pertinent Vitals/Pain Pain Assessment: 0-10 Pain Score: 8  Pain Location: Rt ankle Pain Descriptors / Indicators: Constant;Numbness;Throbbing Pain Intervention(s): Limited activity within patient's tolerance;Premedicated before session;Repositioned;Monitored during session    Home Living Family/patient expects to be discharged to:: Shelter/Homeless                 Additional Comments: staying at [facility] [place]     Prior Function Level of Independence: Independent               Hand Dominance        Extremity/Trunk Assessment   Upper Extremity Assessment: Defer to OT evaluation           Lower Extremity Assessment: RLE deficits/detail RLE Deficits / Details:  Rt ankle edema noted; c/o Rt ankle surgery in December     Cervical / Trunk Assessment: Normal  Communication   Communication: No difficulties  Cognition Arousal/Alertness: Awake/alert Behavior During Therapy: Anxious Overall Cognitive Status: Within Functional Limits for tasks assessed                      General Comments General comments (skin integrity, edema, etc.): pt plans [facility]; agreeable to attempt ambulation with cane    Exercises        Assessment/Plan    PT Assessment Patient needs continued PT services  PT Diagnosis Abnormality of gait;Acute pain;Generalized weakness   PT Problem List Decreased range of motion;Decreased activity tolerance;Decreased balance;Decreased mobility;Pain;Decreased knowledge of use of DME  PT Treatment Interventions DME instruction;Gait training;Functional mobility training;Therapeutic activities;Therapeutic exercise;Balance training;Neuromuscular re-education;Patient/family education   PT Goals (Current goals can be found in the Care Plan section) Acute Rehab PT Goals Patient Stated Goal: to get this test done  PT Goal Formulation: With patient Time For Goal Achievement: 12/10/13 Potential to Achieve Goals: Good    Frequency Min 3X/week   Barriers to discharge Decreased caregiver support      Co-evaluation               End of Session Equipment Utilized During Treatment: Gait belt Activity Tolerance: Patient limited by pain Patient left: in bed;with call bell/phone within reach;Other (comment) (transport in room for CT scan) Nurse Communication: Mobility status         Time: 1610-9604 PT Time Calculation (min): 14 min   Charges:   PT Evaluation $Initial PT Evaluation Tier I: 1 Procedure PT Treatments $Gait Training: 8-22 mins   PT G CodesDonell Sievert, [place]  [phone] 12/07/2013, 2:31 PM

## 2013-12-07 NOTE — Progress Notes (Signed)
Utilization Review Completed.   Kimya Mccahill, RN, BSN Nurse Case Manager  

## 2013-12-07 NOTE — Progress Notes (Signed)
Upgraded job to "0" status and paged [place] with transport to expedite exam

## 2013-12-07 NOTE — Consult Note (Addendum)
[facility]    Date of Admission:  12/06/2013  Date of Consult:  12/07/2013  Reason for Consult: HIV, ? CAP, PCP Referring Physician: Dr. Grandville Silos   HPI: [name] is an 38 y.o. male  With history of HIV diagnosed approximately 10 years ago not been on any medication, diabetes mellitus, hypertension, history of CHF who states has been out of his medications for approximately a month recently moved from [place] to [place] one day prior to admission currently homeless, and presented to the emergency room with a one-month history of a productive cough occasionally clear sometimes yellowish and one episode of bloody, shortness of breath, chest pain associated with hacking cough. Patient stated that his symptoms worsened and subsequently presented to the ED. Patient denies any fevers, no chills  BNp was 1289 in ED> CXR read as "cannot exclude right basilar infiltrate." He has been admitted to [facility] and started on therapy for CAP and possible PCP.  Patient states he last saw HIV provider a year ago and has never been on meds. He is currently living in a shelter. He has no job x > 1-2 years and should qualify for HIV housing assistance. He denies substance abuse.    Past Medical History  Diagnosis Date  . HIV (human immunodeficiency virus infection)   . Diabetes mellitus without complication   . Hypertension   . CHF (congestive heart failure)   . DM2 (diabetes mellitus, type 2) 12/06/2013  . H/O CHF 12/06/2013    Past Surgical History  Procedure Laterality Date  . Ankle surgery Right   ergies:   No Known Allergies   Medications: I have reviewed patients current medications as documented in Epic Anti-infectives   Start     Dose/Rate Route Frequency Ordered Stop   12/07/13 1300  azithromycin (ZITHROMAX) 500 mg in dextrose 5 % 250 mL IVPB     500 mg 250 mL/hr over 60 Minutes Intravenous Every 24 hours 12/06/13 1439 12/14/13 1259   12/07/13 1200   cefTRIAXone (ROCEPHIN) 1 g in dextrose 5 % 50 mL IVPB     1 g 100 mL/hr over 30 Minutes Intravenous Every 24 hours 12/06/13 1439 12/14/13 1159   12/06/13 1600  sulfamethoxazole-trimethoprim (BACTRIM) 480 mg of trimethoprim in dextrose 5 % 500 mL IVPB     480 mg of trimethoprim 353.3 mL/hr over 90 Minutes Intravenous Every 8 hours 12/06/13 1439     12/06/13 1115  azithromycin (ZITHROMAX) 500 mg in dextrose 5 % 250 mL IVPB     500 mg 250 mL/hr over 60 Minutes Intravenous  Once 12/06/13 1105 12/06/13 1411   12/06/13 1115  cefTRIAXone (ROCEPHIN) 1 g in dextrose 5 % 50 mL IVPB     1 g 100 mL/hr over 30 Minutes Intravenous  Once 12/06/13 1105 12/06/13 1257      Social History:  reports that he has been smoking Cigarettes.  He has been smoking about 0.25 packs per day. He does not have any smokeless tobacco history on file. He reports that he drinks alcohol. He reports that he uses illicit drugs (Marijuana).  Family History  Problem Relation Age of Onset  . Other Father     As in HPI and primary teams notes otherwise 12 point review of systems is negative  Blood pressure 131/86, pulse 94, temperature 98 F (36.7 C), temperature source Oral, resp. rate 18, height _0  (1.651 m), weight 199 lb 14.4 oz (90.674 kg), SpO2 96.00%. General: Alert  and awake, oriented x3, anxious HEENT: anicteric sclera, EOMI, oropharynx clear and without exudate CVS regular rate, normal r,  no murmur rubs or gallops Chest: fairly clear to auscultation bilaterally, no wheezing, rales or rhonchi Abdomen: soft nontender, nondistended, normal bowel sounds, Extremities: no  clubbing or edema noted bilaterally Skin: no rashes Neuro: nonfocal, strength and sensation intact   Results for orders placed during the hospital encounter of 12/06/13 (from the past 48 hour(s))  CBC WITH DIFFERENTIAL     Status: Abnormal   Collection Time    12/06/13  9:20 AM      Result Value Ref Range   WBC 5.7  4.0 - 10.5 K/uL   RBC  4.09 (*) 4.22 - 5.81 MIL/uL   Hemoglobin 13.5  13.0 - 17.0 g/dL   HCT 38.7 (*) 39.0 - 52.0 %   MCV 94.6  78.0 - 100.0 fL   MCH 33.0  26.0 - 34.0 pg   MCHC 34.9  30.0 - 36.0 g/dL   RDW 12.5  11.5 - 15.5 %   Platelets 134 (*) 150 - 400 K/uL   Neutrophils Relative % 77  43 - 77 %   Neutro Abs 4.4  1.7 - 7.7 K/uL   Lymphocytes Relative 13  12 - 46 %   Lymphs Abs 0.7  0.7 - 4.0 K/uL   Monocytes Relative 10  3 - 12 %   Monocytes Absolute 0.6  0.1 - 1.0 K/uL   Eosinophils Relative 0  0 - 5 %   Eosinophils Absolute 0.0  0.0 - 0.7 K/uL   Basophils Relative 0  0 - 1 %   Basophils Absolute 0.0  0.0 - 0.1 K/uL  COMPREHENSIVE METABOLIC PANEL     Status: Abnormal   Collection Time    12/06/13  9:20 AM      Result Value Ref Range   Sodium 136 (*) 137 - 147 mEq/L   Potassium 4.3  3.7 - 5.3 mEq/L   Chloride 96  96 - 112 mEq/L   CO2 26  19 - 32 mEq/L   Glucose, Bld 202 (*) 70 - 99 mg/dL   BUN 7  6 - 23 mg/dL   Creatinine, Ser 0.82  0.50 - 1.35 mg/dL   Calcium 8.7  8.4 - 10.5 mg/dL   Total Protein 7.9  6.0 - 8.3 g/dL   Albumin 3.3 (*) 3.5 - 5.2 g/dL   AST 48 (*) 0 - 37 U/L   ALT 47  0 - 53 U/L   Alkaline Phosphatase 82  39 - 117 U/L   Total Bilirubin 0.5  0.3 - 1.2 mg/dL   GFR calc non Af Amer >90  >90 mL/min   GFR calc Af Amer >90  >90 mL/min   Comment: (NOTE)     The eGFR has been calculated using the CKD EPI equation.     This calculation has not been validated in all clinical situations.     eGFR's persistently <90 mL/min signify possible Chronic Kidney     Disease.   Anion gap 14  5 - 15  PRO B NATRIURETIC PEPTIDE     Status: Abnormal   Collection Time    12/06/13  9:20 AM      Result Value Ref Range   Pro B Natriuretic peptide (BNP) 1289.0 (*) 0 - 125 pg/mL  Randolm Idol, ED     Status: None   Collection Time    12/06/13  9:43 AM  Result Value Ref Range   Troponin i, poc 0.02  0.00 - 0.08 ng/mL   Comment 3            Comment: Due to the release kinetics of cTnI,      a negative result within the first hours     of the onset of symptoms does not rule out     myocardial infarction with certainty.     If myocardial infarction is still suspected,     repeat the test at appropriate intervals.  I-STAT CG4 LACTIC ACID, ED     Status: None   Collection Time    12/06/13  9:45 AM      Result Value Ref Range   Lactic Acid, Venous 1.95  0.5 - 2.2 mmol/L  URINALYSIS, ROUTINE W REFLEX MICROSCOPIC     Status: Abnormal   Collection Time    12/06/13 12:10 PM      Result Value Ref Range   Color, Urine YELLOW  YELLOW   APPearance CLEAR  CLEAR   Specific Gravity, Urine 1.022  1.005 - 1.030   pH 7.5  5.0 - 8.0   Glucose, UA >1000 (*) NEGATIVE mg/dL   Hgb urine dipstick NEGATIVE  NEGATIVE   Bilirubin Urine NEGATIVE  NEGATIVE   Ketones, ur 15 (*) NEGATIVE mg/dL   Protein, ur >300 (*) NEGATIVE mg/dL   Urobilinogen, UA 1.0  0.0 - 1.0 mg/dL   Nitrite NEGATIVE  NEGATIVE   Leukocytes, UA NEGATIVE  NEGATIVE  STREP PNEUMONIAE URINARY ANTIGEN     Status: None   Collection Time    12/06/13 12:10 PM      Result Value Ref Range   Strep Pneumo Urinary Antigen NEGATIVE  NEGATIVE   Comment:            Infection due to S. pneumoniae     cannot be absolutely ruled out     since the antigen present     may be below the detection limit     of the test.  URINE MICROSCOPIC-ADD ON     Status: None   Collection Time    12/06/13 12:10 PM      Result Value Ref Range   Squamous Epithelial / LPF RARE  RARE   WBC, UA 0-2  <3 WBC/hpf  MAGNESIUM     Status: Abnormal   Collection Time    12/06/13  2:05 PM      Result Value Ref Range   Magnesium 1.3 (*) 1.5 - 2.5 mg/dL  PROTIME-INR     Status: None   Collection Time    12/06/13  2:05 PM      Result Value Ref Range   Prothrombin Time 13.7  11.6 - 15.2 seconds   INR 1.05  0.00 - 1.49  APTT     Status: None   Collection Time    12/06/13  2:05 PM      Result Value Ref Range   aPTT 31  24 - 37 seconds  GLUCOSE, CAPILLARY      Status: Abnormal   Collection Time    12/06/13  2:39 PM      Result Value Ref Range   Glucose-Capillary 279 (*) 70 - 99 mg/dL   Comment 1 Documented in Chart     Comment 2 Notify RN    INFLUENZA PANEL BY PCR (TYPE A & B, H1N1)     Status: None   Collection Time    12/06/13  2:48 PM  Result Value Ref Range   Influenza A By PCR NEGATIVE  NEGATIVE   Influenza B By PCR NEGATIVE  NEGATIVE   H1N1 flu by pcr NOT DETECTED  NOT DETECTED   Comment:            The Xpert Flu assay (FDA approved for     nasal aspirates or washes and     nasopharyngeal swab specimens), is     intended as an aid in the diagnosis of     influenza and should not be used as     a sole basis for treatment.  TROPONIN I     Status: None   Collection Time    12/06/13  4:15 PM      Result Value Ref Range   Troponin I <0.30  <0.30 ng/mL   Comment:            Due to the release kinetics of cTnI,     a negative result within the first hours     of the onset of symptoms does not rule out     myocardial infarction with certainty.     If myocardial infarction is still suspected,     repeat the test at appropriate intervals.  HEMOGLOBIN A1C     Status: Abnormal   Collection Time    12/06/13  4:15 PM      Result Value Ref Range   Hemoglobin A1C 9.7 (*) <5.7 %   Comment: (NOTE)                                                                               According to the ADA Clinical Practice Recommendations for 2011, when     HbA1c is used as a screening test:      >=6.5%   Diagnostic of Diabetes Mellitus               (if abnormal result is confirmed)     5.7-6.4%   Increased risk of developing Diabetes Mellitus     References:Diagnosis and Classification of Diabetes Mellitus,Diabetes     DHRC,1638,45(XMIWO 1):S62-S69 and Standards of Medical Care in             Diabetes - 2011,Diabetes Care,2011,34 (Suppl 1):S11-S61.   Mean Plasma Glucose 232 (*) <117 mg/dL   Comment: Performed at [facility], ARTERIAL     Status: Abnormal   Collection Time    12/06/13  4:15 PM      Result Value Ref Range   Delivery systems NASAL CANNULA     pH, Arterial 7.492 (*) 7.350 - 7.450   pCO2 arterial 31.2 (*) 35.0 - 45.0 mmHg   pO2, Arterial 165.0 (*) 80.0 - 100.0 mmHg   Bicarbonate 23.7  20.0 - 24.0 mEq/L   TCO2 24.6  0 - 100 mmol/L   Acid-Base Excess 0.6  0.0 - 2.0 mmol/L   O2 Saturation 99.6     Patient temperature 98.6     Collection site RIGHT RADIAL     Drawn by [id number]     Sample type ARTERIAL DRAW     Allens test (pass/fail) PASS  PASS  TROPONIN I  Status: None   Collection Time    12/06/13 10:00 PM      Result Value Ref Range   Troponin I <0.30  <0.30 ng/mL   Comment:            Due to the release kinetics of cTnI,     a negative result within the first hours     of the onset of symptoms does not rule out     myocardial infarction with certainty.     If myocardial infarction is still suspected,     repeat the test at appropriate intervals.  GLUCOSE, CAPILLARY     Status: Abnormal   Collection Time    12/06/13 10:26 PM      Result Value Ref Range   Glucose-Capillary 170 (*) 70 - 99 mg/dL  CULTURE, EXPECTORATED SPUTUM-ASSESSMENT     Status: None   Collection Time    12/06/13 10:35 PM      Result Value Ref Range   Specimen Description SPUTUM     Special Requests NONE     Sputum evaluation       Value: MICROSCOPIC FINDINGS SUGGEST THAT THIS SPECIMEN IS NOT REPRESENTATIVE OF LOWER RESPIRATORY SECRETIONS. PLEASE RECOLLECT.     Results Called to: Mack [place] [id number] [place]   Report Status 12/06/2013 FINAL    TROPONIN I     Status: None   Collection Time    12/07/13  4:14 AM      Result Value Ref Range   Troponin I <0.30  <0.30 ng/mL   Comment:            Due to the release kinetics of cTnI,     a negative result within the first hours     of the onset of symptoms does not rule out     myocardial infarction with certainty.     If myocardial infarction is still  suspected,     repeat the test at appropriate intervals.  COMPREHENSIVE METABOLIC PANEL     Status: Abnormal   Collection Time    12/07/13  4:45 AM      Result Value Ref Range   Sodium 127 (*) 137 - 147 mEq/L   Comment: DELTA CHECK NOTED     SLIGHT HEMOLYSIS   Potassium 4.4  3.7 - 5.3 mEq/L   Comment: HEMOLYSIS AT THIS LEVEL MAY AFFECT RESULT   Chloride 90 (*) 96 - 112 mEq/L   CO2 22  19 - 32 mEq/L   Glucose, Bld 266 (*) 70 - 99 mg/dL   BUN 9  6 - 23 mg/dL   Creatinine, Ser 0.86  0.50 - 1.35 mg/dL   Calcium 8.8  8.4 - 10.5 mg/dL   Total Protein 7.9  6.0 - 8.3 g/dL   Albumin 2.9 (*) 3.5 - 5.2 g/dL   AST 35  0 - 37 U/L   Comment: HEMOLYSIS AT THIS LEVEL MAY AFFECT RESULT   ALT 35  0 - 53 U/L   Comment: HEMOLYSIS AT THIS LEVEL MAY AFFECT RESULT   Alkaline Phosphatase 78  39 - 117 U/L   Total Bilirubin 0.4  0.3 - 1.2 mg/dL   GFR calc non Af Amer >90  >90 mL/min   GFR calc Af Amer >90  >90 mL/min   Comment: (NOTE)     The eGFR has been calculated using the CKD EPI equation.     This calculation has not been validated in all clinical situations.     eGFR's  persistently <90 mL/min signify possible Chronic Kidney     Disease.   Anion gap 15  5 - 15  CBC WITH DIFFERENTIAL     Status: Abnormal   Collection Time    12/07/13  4:45 AM      Result Value Ref Range   WBC 5.8  4.0 - 10.5 K/uL   RBC 4.13 (*) 4.22 - 5.81 MIL/uL   Hemoglobin 13.6  13.0 - 17.0 g/dL   HCT 38.9 (*) 39.0 - 52.0 %   MCV 94.2  78.0 - 100.0 fL   MCH 32.9  26.0 - 34.0 pg   MCHC 35.0  30.0 - 36.0 g/dL   RDW 12.6  11.5 - 15.5 %   Platelets 131 (*) 150 - 400 K/uL   Neutrophils Relative % 80 (*) 43 - 77 %   Neutro Abs 4.7  1.7 - 7.7 K/uL   Lymphocytes Relative 14  12 - 46 %   Lymphs Abs 0.8  0.7 - 4.0 K/uL   Monocytes Relative 7  3 - 12 %   Monocytes Absolute 0.4  0.1 - 1.0 K/uL   Eosinophils Relative 0  0 - 5 %   Eosinophils Absolute 0.0  0.0 - 0.7 K/uL   Basophils Relative 0  0 - 1 %   Basophils Absolute 0.0   0.0 - 0.1 K/uL  MAGNESIUM     Status: None   Collection Time    12/07/13  4:45 AM      Result Value Ref Range   Magnesium 1.9  1.5 - 2.5 mg/dL  GLUCOSE, CAPILLARY     Status: Abnormal   Collection Time    12/07/13  6:10 AM      Result Value Ref Range   Glucose-Capillary 233 (*) 70 - 99 mg/dL   _0 (sdes,specrequest,cult,reptstatus)   ) Recent Results (from the past 720 hour(s))  CULTURE, EXPECTORATED SPUTUM-ASSESSMENT     Status: None   Collection Time    12/06/13 10:35 PM      Result Value Ref Range Status   Specimen Description SPUTUM   Final   Special Requests NONE   Final   Sputum evaluation     Final   Value: MICROSCOPIC FINDINGS SUGGEST THAT THIS SPECIMEN IS NOT REPRESENTATIVE OF LOWER RESPIRATORY SECRETIONS. PLEASE RECOLLECT.     Results Called to: Mack [place] [id number] [place]   Report Status 12/06/2013 FINAL   Final     Impression/Recommendation  Principal Problem:   Acute respiratory distress Active Problems:   CAP (community acquired pneumonia)   Malignant hypertension   DM2 (diabetes mellitus, type 2)   HIV (human immunodeficiency virus infection)   H/O CHF   Cardiomegaly: per cxr 12/06/13   Brennen Corbit is a 38 y.o. male with  Cough, dyspnea, productive with one episode of blood tinged sputum, CXR without clear cut PNA and currently on CAP and PCP abx  #1 Cough, hypoxia, Dyspnea: I am not convinced he has pneumonia. I will get a CT Angiogram to ensure no pulmonary embolism, and this will better elucidate lungs. I will stop his HCTZ to minimize nephrotoxic risk of contrast nephropathy and give bolus of NS  #2 HIV: --HIV RNA, genotype, and will add HIV integrase genotype --After further discussion patient would prefer low side effects and simplicity of dosing so will go with Tivicay and Truvada  --he will need ADAP application and would consider urgent application given that he was hospitalized . However we have to be judicious in  use of this  "emergency application" would not ever use EFV with this man who says he "almost committed suicide when I found out I had HIV.  He states he as seen at clinic "[facility]". I am thinking perhaps he was seen by [facility] ID at [facility] Dept. With no insurance he would not have been seen by [facility] ID  We should get records from clinic in [place] with re to vaccines etc if possible   I spent greater than 60 minutes with the patient including greater than 50% of time in face to face counsel of the patient and in coordination of their care.   Dr Linus Salmons will be back tomorrow.   12/07/2013, 11:17 AM   Thank you so much for this interesting consult  [place] [facility] [place] [phone] (pager) [phone] (office) 12/07/2013, 11:17 AM  Rhina Brackett [place] 12/07/2013, 11:17 AM    PATIENTS CT CHEST SHOWED NO PULMONARY EMBOLISM OR PULMONARY INFILTRATES THEREFORE THIS IS NOT CAP OR PCP I AM DCING ABX  HE HAS MASSIVE CARDIOMEGALY I SUSPECT HIS SSX ARE DUE TO CHF  HE DOES NEED TREATMENT OF HIS HIV, WITH ADAP AND HELP WITH HOUSING

## 2013-12-08 DIAGNOSIS — I517 Cardiomegaly: Secondary | ICD-10-CM

## 2013-12-08 DIAGNOSIS — E1169 Type 2 diabetes mellitus with other specified complication: Secondary | ICD-10-CM

## 2013-12-08 DIAGNOSIS — I428 Other cardiomyopathies: Secondary | ICD-10-CM

## 2013-12-08 LAB — BASIC METABOLIC PANEL WITH GFR
Anion gap: 12 (ref 5–15)
BUN: 14 mg/dL (ref 6–23)
CO2: 23 meq/L (ref 19–32)
Calcium: 8.9 mg/dL (ref 8.4–10.5)
Chloride: 95 meq/L — ABNORMAL LOW (ref 96–112)
Creatinine, Ser: 1.11 mg/dL (ref 0.50–1.35)
GFR calc Af Amer: >90 mL/min (ref >90)
GFR calc non Af Amer: 84 mL/min — ABNORMAL LOW (ref >90)
Glucose, Bld: 214 mg/dL — ABNORMAL HIGH (ref 70–99)
Potassium: 4.8 meq/L (ref 3.7–5.3)
Sodium: 130 meq/L — ABNORMAL LOW (ref 137–147)

## 2013-12-08 LAB — GLUCOSE, CAPILLARY
Glucose-Capillary: 248 mg/dL — ABNORMAL HIGH (ref 70–99)
Glucose-Capillary: 231 mg/dL — ABNORMAL HIGH (ref 70–99)
Glucose-Capillary: 296 mg/dL — ABNORMAL HIGH (ref 70–99)
Glucose-Capillary: 187 mg/dL — ABNORMAL HIGH (ref 70–99)

## 2013-12-08 LAB — HIV-1 RNA ULTRAQUANT REFLEX TO GENTYP+
HIV 1 RNA Quant: 10387 copies/mL — ABNORMAL HIGH (ref <20)
HIV-1 RNA Quant, Log: 4.02 {Log} — ABNORMAL HIGH (ref <1.30)

## 2013-12-08 LAB — HEPATITIS PANEL, ACUTE
HCV Ab: NEGATIVE
Hep A IgM: NONREACTIVE
Hep B C IgM: NONREACTIVE
Hepatitis B Surface Ag: NEGATIVE

## 2013-12-08 LAB — SYPHILIS: RPR W/REFLEX TO RPR TITER AND TREPONEMAL ANTIBODIES, TRADITIONAL SCREENING AND DIAGNOSIS ALGORITHM

## 2013-12-08 LAB — T-HELPER CELLS (CD4) COUNT (NOT AT ARMC)
CD4 T CELL ABS: 270 /uL — AB (ref 400–2700)
CD4 T CELL HELPER: 29 % — AB (ref 33–55)

## 2013-12-08 MED ORDER — AMLODIPINE BESYLATE 10 MG PO TABS
10.0000 mg | ORAL_TABLET | Freq: Every day | ORAL | Status: DC
  Administered 2013-12-08 – 2013-12-09 (×2): 10 mg via ORAL
  Filled 2013-12-08 (×2): qty 1

## 2013-12-08 MED ORDER — PREDNISONE 20 MG PO TABS
20.0000 mg | ORAL_TABLET | Freq: Two times a day (BID) | ORAL | Status: AC
  Administered 2013-12-08 – 2013-12-09 (×3): 20 mg via ORAL
  Filled 2013-12-08 (×3): qty 1

## 2013-12-08 NOTE — Progress Notes (Signed)
[facility]  Date of Admission:  12/06/2013  Antibiotics: tivicay and truvada  Subjective: No complaints  Objective: Temp:  [98.1 F (36.7 C)-98.8 F (37.1 C)] 98.2 F (36.8 C) (08/31 0450) Pulse Rate:  [86-100] 100 (08/31 0450) Resp:  [18] 18 (08/31 0450) BP: (125-159)/(77-110) 125/77 mmHg (08/31 0944) SpO2:  [100 %] 100 % (08/31 0944) Weight:  [200 lb 11.2 oz (91.037 kg)] 200 lb 11.2 oz (91.037 kg) (08/31 0450)  General: awake, alert, nad Skin: no rashes Lungs: CTA B Cor: RRR Abdomen: soft, nt, nd Ext: no edema  Lab Results Lab Results  Component Value Date   WBC 5.8 12/07/2013   HGB 13.6 12/07/2013   HCT 38.9* 12/07/2013   MCV 94.2 12/07/2013   PLT 131* 12/07/2013    Lab Results  Component Value Date   CREATININE 1.11 12/08/2013   BUN 14 12/08/2013   NA 130* 12/08/2013   K 4.8 12/08/2013   CL 95* 12/08/2013   CO2 23 12/08/2013    Lab Results  Component Value Date   ALT 35 12/07/2013   AST 35 12/07/2013   ALKPHOS 78 12/07/2013   BILITOT 0.4 12/07/2013      Microbiology: Recent Results (from the past 240 hour(s))  CULTURE, BLOOD (ROUTINE X 2)     Status: None   Collection Time    12/06/13  9:20 AM      Result Value Ref Range Status   Specimen Description BLOOD RIGHT ARM   Final   Special Requests BOTTLES DRAWN AEROBIC AND ANAEROBIC   Final   Culture  Setup Time     Final   Value: 12/06/2013 18:14     Performed at [facility]   Culture     Final   Value:        BLOOD CULTURE RECEIVED NO GROWTH TO DATE CULTURE WILL BE HELD FOR 5 DAYS BEFORE ISSUING A FINAL NEGATIVE REPORT     Performed at [facility]   Report Status PENDING   Incomplete  CULTURE, BLOOD (ROUTINE X 2)     Status: None   Collection Time    12/06/13  9:30 AM      Result Value Ref Range Status   Specimen Description BLOOD LEFT HAND   Final   Special Requests BOTTLES DRAWN AEROBIC AND ANAEROBIC 5CC   Final   Culture  Setup Time     Final   Value: 12/06/2013 18:14     Performed at [facility]   Culture     Final   Value:        BLOOD CULTURE RECEIVED NO GROWTH TO DATE CULTURE WILL BE HELD FOR 5 DAYS BEFORE ISSUING A FINAL NEGATIVE REPORT     Performed at [facility]   Report Status PENDING   Incomplete  URINE CULTURE     Status: None   Collection Time    12/06/13 12:58 PM      Result Value Ref Range Status   Specimen Description URINE, RANDOM   Final   Special Requests NONE   Final   Culture  Setup Time     Final   Value: 12/06/2013 22:03     Performed at [facility] Count     Final   Value: 1,000 COLONIES/ML     Performed at [facility]   Culture     Final   Value: INSIGNIFICANT GROWTH  Performed at [facility]   Report Status 12/07/2013 FINAL   Final  CULTURE, EXPECTORATED SPUTUM-ASSESSMENT     Status: None   Collection Time    12/06/13 10:35 PM      Result Value Ref Range Status   Specimen Description SPUTUM   Final   Special Requests NONE   Final   Sputum evaluation     Final   Value: MICROSCOPIC FINDINGS SUGGEST THAT THIS SPECIMEN IS NOT REPRESENTATIVE OF LOWER RESPIRATORY SECRETIONS. PLEASE RECOLLECT.     Results Called toNorton Pastel [id number]   Report Status 12/06/2013 FINAL   Final    Studies/Results: Ct Angio Chest Pe W/cm &/or Wo Cm  12/07/2013   CLINICAL DATA:  Cough.  Hypoxia.  EXAM: CT ANGIOGRAPHY CHEST WITH CONTRAST  TECHNIQUE: Multidetector CT imaging of the chest was performed using the standard protocol during bolus administration of intravenous contrast. Multiplanar CT image reconstructions and MIPs were obtained to evaluate the vascular anatomy.  CONTRAST:  80mL OMNIPAQUE IOHEXOL 350 MG/ML SOLN  COMPARISON:  Chest x-ray 12/28/2013.  FINDINGS: Pulmonary arteries are normal. No pulmonary embolus. Thoracic aorta normal caliber. Severe cardiomegaly. Mild pericardial thickening. Tiny pericardial effusion cannot be excluded. Coronary  artery disease.  Shotty mediastinal lymph nodes. Small sliding hiatal hernia. Small amount of fluid noted in the distal thoracic esophagus. Gastroesophageal reflux may be present.  Large airways are patent. No focal pulmonary alveolar infiltrates are noted. No pleural effusion or pneumothorax. Multiple tiny subcentimeter nodules are noted throughout both lungs, the majority of which are calcified. These findings are consistent with prior granulomas disease.  Visualized upper abdominal viscera unremarkable.  Thyroid normal size. No significant axillary adenopathy. No acute bony abnormality.  Review of the MIP images confirms the above findings.  IMPRESSION: 1. No evidence of pulmonary embolus. 2. Severe cardiomegaly. Tiny pericardial effusion cannot be excluded. Coronary artery disease. 3. Evidence of prior granulomatous disease. 4. Tiny sliding hiatal hernia. Fluid in the distal thoracic esophagus. Gastroesophageal reflux could present this fashion.   Electronically Signed   By: Maisie Fus  Register   On: 12/07/2013 14:03    Assessment/Plan: 1) HIV - CD4 270 with vl of 10,387.  Just started on appropriate therapy.  Will need a 30 day supply at discharge and need to get ADAP/drug assistance program.  He also will need [facility] case management due to unstable housing.    2) cardiomyopathy - echo pending.    Dr. Ninetta Lights here tomorrow.   Staci Righter, MD [facility] [email or web address] [phone] pager   [phone] cell 12/08/2013, 1:10 PM

## 2013-12-08 NOTE — Progress Notes (Addendum)
[facility] PROGRESS NOTE  Shaun Brennan ZOX:[id number] DOB: 05/17/1975 DOA: 12/06/2013 PCP: No primary provider on file.  Assessment/Plan: #1 Bronchitis, cough, hypoxia -was on Rocephin/Zirthomax and bactrim IV-all stopped per ID based on CT  -FU PCP DFA -mucinex, nebs, supportive care -cut down prednisone -Sputum Cx c/w oral flora, urine legionella and Pneumo Ag negative   #2 malignant hypertension  -improved, but still up, add amlodipine, continue metoprolol and hydralazine PRN  #3 DM 2/uncontrolled AiC 9.7, SSI for now, -also on prednisone for 1 - oral meds at DC  #4 history of CHF/cardiomegaly  -FU 2-D echo.   #5 history of HIV x10 years per patient  -reports never being on hAART -FU CD4 count, viral load 10K -started on HAART, needs ADAP  #6 Homelessness -CSW consulted   DVT prooph: Lovenox   Code Status:Full Code Family Communication: none at bedside Disposition Plan: homeless, CSW consult  Consultants:  ID  HPI/Subjective: Still coughing and with congestion, chest discomfort, but improving  Objective: Filed Vitals:   12/08/13 0450  BP: 149/110  Pulse: 100  Temp: 98.2 F (36.8 C)  Resp: 18    Intake/Output Summary (Last 24 hours) at 12/08/13 0936 Last data filed at 12/08/13 0743  Gross per 24 hour  Intake   1280 ml  Output   3600 ml  Net  -2320 ml   Filed Weights   12/06/13 1453 12/07/13 0428 12/08/13 0450  Weight: 91.264 kg (201 lb 3.2 oz) 90.674 kg (199 lb 14.4 oz) 91.037 kg (200 lb 11.2 oz)    Exam:   General:  AAOx3, ill appearing  Cardiovascular: S1S2/RRR  Respiratory: scattered ronchi  Abdomen: soft, Nt, SB present  Musculoskeletal:some chronic R ankle swelling  Data Reviewed: Basic Metabolic Panel:  Recent Labs Lab 12/06/13 0920 12/06/13 1405 12/07/13 0445 12/08/13 0713  NA 136*  --  127* 130*  K 4.3  --  4.4 4.8  CL 96  --  90* 95*  CO2 26  --  22 23  GLUCOSE 202*  --  266* 214*  BUN 7  --  9 14   CREATININE 0.82  --  0.86 1.11  CALCIUM 8.7  --  8.8 8.9  MG  --  1.3* 1.9  --    Liver Function Tests:  Recent Labs Lab 12/06/13 0920 12/07/13 0445  AST 48* 35  ALT 47 35  ALKPHOS 82 78  BILITOT 0.5 0.4  PROT 7.9 7.9  ALBUMIN 3.3* 2.9*   No results found for this basename: LIPASE, AMYLASE,  in the last 168 hours No results found for this basename: AMMONIA,  in the last 168 hours CBC:  Recent Labs Lab 12/06/13 0920 12/07/13 0445  WBC 5.7 5.8  NEUTROABS 4.4 4.7  HGB 13.5 13.6  HCT 38.7* 38.9*  MCV 94.6 94.2  PLT 134* 131*   Cardiac Enzymes:  Recent Labs Lab 12/06/13 1615 12/06/13 2200 12/07/13 0414  TROPONINI <0.30 <0.30 <0.30   BNP (last 3 results)  Recent Labs  12/06/13 0920  PROBNP 1289.0*   CBG:  Recent Labs Lab 12/07/13 0610 12/07/13 1130 12/07/13 1631 12/07/13 2041 12/08/13 0554  GLUCAP 233* 212* 261* 369* 248*    Recent Results (from the past 240 hour(s))  CULTURE, BLOOD (ROUTINE X 2)     Status: None   Collection Time    12/06/13  9:20 AM      Result Value Ref Range Status   Specimen Description BLOOD RIGHT ARM   Final   Special  Requests BOTTLES DRAWN AEROBIC AND ANAEROBIC   Final   Culture  Setup Time     Final   Value: 12/06/2013 18:14     Performed at [facility]   Culture     Final   Value:        BLOOD CULTURE RECEIVED NO GROWTH TO DATE CULTURE WILL BE HELD FOR 5 DAYS BEFORE ISSUING A FINAL NEGATIVE REPORT     Performed at [facility]   Report Status PENDING   Incomplete  CULTURE, BLOOD (ROUTINE X 2)     Status: None   Collection Time    12/06/13  9:30 AM      Result Value Ref Range Status   Specimen Description BLOOD LEFT HAND   Final   Special Requests BOTTLES DRAWN AEROBIC AND ANAEROBIC 5CC   Final   Culture  Setup Time     Final   Value: 12/06/2013 18:14     Performed at [facility]   Culture     Final   Value:        BLOOD CULTURE RECEIVED NO GROWTH TO DATE CULTURE WILL BE HELD FOR 5  DAYS BEFORE ISSUING A FINAL NEGATIVE REPORT     Performed at [facility]   Report Status PENDING   Incomplete  URINE CULTURE     Status: None   Collection Time    12/06/13 12:58 PM      Result Value Ref Range Status   Specimen Description URINE, RANDOM   Final   Special Requests NONE   Final   Culture  Setup Time     Final   Value: 12/06/2013 22:03     Performed at [facility] Count     Final   Value: 1,000 COLONIES/ML     Performed at [facility]   Culture     Final   Value: INSIGNIFICANT GROWTH     Performed at [facility]   Report Status 12/07/2013 FINAL   Final  CULTURE, EXPECTORATED SPUTUM-ASSESSMENT     Status: None   Collection Time    12/06/13 10:35 PM      Result Value Ref Range Status   Specimen Description SPUTUM   Final   Special Requests NONE   Final   Sputum evaluation     Final   Value: MICROSCOPIC FINDINGS SUGGEST THAT THIS SPECIMEN IS NOT REPRESENTATIVE OF LOWER RESPIRATORY SECRETIONS. PLEASE RECOLLECT.     Results Called toNorton Pastel [id number] WilderK   Report Status 12/06/2013 FINAL   Final     Studies: Dg Chest 2 View  12/06/2013   CLINICAL DATA:  Cough and chest pain.  EXAM: CHEST  2 VIEW  COMPARISON:  None.  FINDINGS: Mediastinum and hilar structures are normal. Cardiomegaly. Pulmonary vascularity is normal. Mild right base infiltrate cannot be excluded. No pleural effusion or pneumothorax.  IMPRESSION: 1. Mild right base infiltrate cannot be excluded. 2. Cardiomegaly, no evidence of congestive heart failure.   Electronically Signed   By: Maisie Fus  Register   On: 12/06/2013 10:42   Ct Angio Chest Pe W/cm &/or Wo Cm  12/07/2013   CLINICAL DATA:  Cough.  Hypoxia.  EXAM: CT ANGIOGRAPHY CHEST WITH CONTRAST  TECHNIQUE: Multidetector CT imaging of the chest was performed using the standard protocol during bolus administration of intravenous contrast. Multiplanar CT image reconstructions and MIPs were obtained to  evaluate the vascular anatomy.  CONTRAST:  62mL OMNIPAQUE  IOHEXOL 350 MG/ML SOLN  COMPARISON:  Chest x-ray 12/28/2013.  FINDINGS: Pulmonary arteries are normal. No pulmonary embolus. Thoracic aorta normal caliber. Severe cardiomegaly. Mild pericardial thickening. Tiny pericardial effusion cannot be excluded. Coronary artery disease.  Shotty mediastinal lymph nodes. Small sliding hiatal hernia. Small amount of fluid noted in the distal thoracic esophagus. Gastroesophageal reflux may be present.  Large airways are patent. No focal pulmonary alveolar infiltrates are noted. No pleural effusion or pneumothorax. Multiple tiny subcentimeter nodules are noted throughout both lungs, the majority of which are calcified. These findings are consistent with prior granulomas disease.  Visualized upper abdominal viscera unremarkable.  Thyroid normal size. No significant axillary adenopathy. No acute bony abnormality.  Review of the MIP images confirms the above findings.  IMPRESSION: 1. No evidence of pulmonary embolus. 2. Severe cardiomegaly. Tiny pericardial effusion cannot be excluded. Coronary artery disease. 3. Evidence of prior granulomatous disease. 4. Tiny sliding hiatal hernia. Fluid in the distal thoracic esophagus. Gastroesophageal reflux could present this fashion.   Electronically Signed   By: Thomas  Register   On: 12/07/2013 14:03    Scheduled Meds: . antiseptic oral rinse  7 mL Mouth Rinse BID  . dolutegravir  50 mg Oral Daily  . emtricitabine-tenofovir  1 tablet Oral Daily  . enoxaparin (LOVENOX) injection  40 mg Subcutaneous Q24H  . famotidine  20 mg Oral Daily  . guaiFENesin  1,200 mg Oral BID  . insulin aspart  0-15 Units Subcutaneous TID WC  . metoprolol tartrate  25 mg Oral BID  . predniSONE  20 mg Oral BID WC   Continuous Infusions:   Antibiotics Given (last 72 hours)   Date/Time Action Medication Dose Rate   12/06/13 1718 Given   sulfamethoxazole-trimethoprim (BACTRIM) 480 mg of  trimethoprim in dextrose 5 % 500 mL IVPB 480 mg of trimethoprim 353.3 mL/hr   12/07/13 0100 Given   sulfamethoxazole-trimethoprim (BACTRIM) 480 mg of trimethoprim in dextrose 5 % 500 mL IVPB 480 mg of trimethoprim 353.3 mL/hr   12/07/13 0946 Given  [non on floor at scheduled time]   sulfamethoxazole-trimethoprim (BACTRIM) 480 mg of trimethoprim in dextrose 5 % 500 mL IVPB 480 mg of trimethoprim 353.3 mL/hr   12/07/13 1501 Given   cefTRIAXone (ROCEPHIN) 1 g in dextrose 5 % 50 mL IVPB 1 g 100 mL/hr   12/07/13 1633 Given   azithromycin (ZITHROMAX) 500 mg in dextrose 5 % 250 mL IVPB 500 mg 250 mL/hr   12/07/13 2045 Given   dolutegravir (TIVICAY) tablet 50 mg 50 mg    12/07/13 2045 Given   emtricitabine-tenofovir (TRUVADA) 200-300 MG per tablet 1 tablet 1 tablet       Principal Problem:   Acute respiratory distress Active Problems:   CAP (community acquired pneumonia)   Malignant hypertension   DM2 (diabetes mellitus, type 2)   HIV (human immunodeficiency virus infection)   H/O CHF   Cardiomegaly: per cxr 12/06/13    Time spent: <MEASUREMWynelle ClevRenae FicklelWiregrass MediMarland MarKentuckyGrand Strand [id number]<MEASUREMENWynelle ClevRenae FicklelGreene CountMarland [id number]<MEASUREMENWynelle ClevRenae FicklelMile Bluff Medical Marland MarKentuckySt. David'S [id number] M<MEASUREMENWynelle ClevRenae FicklelSelect Specialty Hospital Marland MarKentuckySurgery Center [id number]<MEASUREMENWynelle ClevRenae FicklelSurgery CentMarland [id number] Ma<MEASUREMENWynelle ClevRenae FicklelMercy Walworth Hospital & MediMarland [id number]<MEASUREMENWynelle ClevRenae FicklelCraiMarland MarKentuckySelect Speciality [id number] M<MEASUREMENWynelle ClevRenae FicklelRumforMarland MarKentuckyMs [id number] New <MEASUREMENWynelle ClevRenae FicklelEvans MemoriaMarland MarKentuckyMission H0011060 Thom<MEASUREMENWynelle ClevRenae FicklelStat SpecialtMarland MarKentuckyFlorence [id number] <MEASUREMENWynelle ClevRenae FickMarland001<MEASUREMENWynelle ClevRenae FicklelKindred HospitalMarland MarKentuckyLake [id number] Wo<MEASUREMENWynelle ClevRenae FicklelSpaulding Rehabilitation HospitaMarland [id number] Studeb<MEASUREMENWynelle ClevRenae FicklelArlington DMarland [id number] H<MEASUREMENWynelle ClevRenae FicklelRevision Advanced Surgery Marland MarKentuckyNorthern New Jersey Center For [id number] Ri<MEASUREMENWynelle ClevRenae FicklelValley Endoscopy Marland MarKentuckyMercy Medica0011094<MEASUREMENWynelle ClevRenae FicklelKindred HosMarland [id number] W. T<MEASUREMENWynelle ClevRenae FicklelEllis Hospital Bellevue Woman'S Care CenteMarland MarKentuckyProvidence Little Company Of Mary [id number] <MEASUREMENWynelle ClevRenae FicklelBlount MemoriaMarland MarKentuckyMarian Regional Medical C0011041 W. Be<MEASUREMENWynelle ClevRenae FicklelThomas Eye Surgery Marland MarKentuckyCornerstone [id number] South <MEASUREMENWynelle ClevRenae FicklelOrlando SurMarland MarKentuckyMercy [id number]<MEASUREMENWynelle ClevRenae FicklelWest Las Vegas Surgery Center LLC Dba Valley View SurgMarland [id number] East South<MEASUREMENWynelle ClevRenae FicklelThe Surgery Center At Self Memorial HoMarland MarKentuckyRus001101 Alton Drivey HospitalA  [facility] Pager [phone]. If 7PM-7AM, please contact night-coverage at [email or web address], password [email or web address] 12/08/2013, 9:36 AM  LOS: 2 days

## 2013-12-08 NOTE — Progress Notes (Signed)
  Echocardiogram 2D Echocardiogram has been performed.  Shaun Brennan 12/08/2013, 9:07 AM

## 2013-12-08 NOTE — Care Management Note (Addendum)
Page 2 of 4   12/10/2013     8:11:25 PM CARE MANAGEMENT NOTE 12/10/2013  Patient:  Shaun Brennan, Shaun Brennan   Account Number:  [id number]  Date Initiated:  12/08/2013  Documentation initiated by:  Donato Schultz  Subjective/Objective Assessment:   Acute Respiratory distress: CAP v Bronchitis.     Action/Plan:   CM to follow for disposition needs   Anticipated DC Date:  12/11/2013   Anticipated DC Plan:  HOME/SELF CARE  In-house referral  Clinical Social Worker  Systems developer  PCP / Management consultant      DC Planning Services  CM consult  CM consult  Follow-up appt scheduled  GCCN / P4HM (established/new)  HF Clinic  Indigent Health Clinic  MATCH Program  Medication Assistance  Other      [facility] Choice  NA   Choice offered to / List presented to:     DME arranged  [facility]      DME agency  [facility]        Status of service:  Completed, signed off Medicare Important Message given?   (If response is "NO", the following Medicare IM given date fields will be blank) Date Medicare IM given:   Medicare IM given by:   Date Additional Medicare IM given:   Additional Medicare IM given by:    Discharge Disposition:  HOME/SELF CARE  Per UR Regulation:  Reviewed for med. necessity/level of care/duration of stay  If discussed at Long Length of Stay Meetings, dates discussed:   12/11/2013    Comments:  Donato Schultz RN, BSN, MSHL, CCM  Nurse - Case Manager,  (Unit [facility])  [phone]  12/10/2013 IM:  N/a - patient is self pay Social:  Patient from [place] and plans to move to [place].  States he has friends in [place] he can live with/Shelter.  Hx/o has not been taking any medications in over one year and all the past meds patient has been on previously came from a pharmacy on [address], [place], [place] or through the ER Department. Patient states he needs to get connected with HIV Clinic in [place] and will need medication assistance. DME:  Gilmer Mor  provided prior to discharge  by [facility] / indigent services Self Pay status:  [name] Financial Advisor:  spoke with patient at bedside to iinitiate MCD application.  Hx/o previously applied and was denied; patient did not appeal thus new application has to start over again. [facility]  [address] [place], [place] [address]  [phone]  Fax [phone] Contact:  [name] CM spoke via TC  with bridge counselor [name], Social Worker, [facility] [employer] [phone] who provided bedside visit, discussed services, left business card.  [name] has initiated ADAP Program paper work which has to be mailed in and takes 3 weeks for processing. [name] indicated patient must be actively engaged and has concerns he will not stay engaged.   CM addressed this concern with patient and encouraged compliance with taking calls, returning calls, keeping appt's in order to receive needed services. Patient overwhelmed with multiple insturctions; CM has added instructionsl for clarity on [facility] and provided patient with folder to stay more organized. CM instructed to save this sheet  which has all appointments and contact names / numbers. CM made referral to Chaplain/Beverly as patient requested to see chaplain again Med Match Letter with override for specialty meds and co-pay override by [name] Disposition:  Hotel arrangements establised via [employer] IP SW, [name]  (See SW note)  Follow-up Information   Follow up with [name], NP On 12/17/2013. (At 1:45pm -- in the [facility]--Please bring all medications code 0050)   Specialty: Nurse Practitioner   Contact information:   [address] [place] [address] [phone]     Follow up with [facility] . (Patient to call contact [name] to request initial assessment appointment; SW Bridge for housing and Medication Assistance)   Contact information:   [address] [place], [place]  [address] [phone]     Please follow up. [name] MATCH Letter to cover 2 specialty meds at the [facility] )   Contact information:   [facility] [address] [place], [place]    Follow up with [facility] . (Patient to set up today - Walk in appointment to establish Primary Care appointment and medication asssitance program )   Contact information:   [address] [place] [address] [phone]    Follow up with [name], MD On 01/13/2014. (Infectious Disease MD appointment 01/13/2014 at 2:00 pm )   Specialty: Infectious Diseases   Contact information:   [address] [place] [address] [phone]     Please follow up. (Very Important to maintain communications with [name], take calls, return calls, keep appointments  in order for her to provide assistance to you.  You must stay actively engaged with her. [name] provided you with her business card. )   Contact information:   [name], Social Worker [facility] [phone]     Donato Schultz RN, BSN, MSHL, CCM  Nurse - Case Manager,  (Unit [place])  [phone]  12/08/2013 Medication Review: HIV - hx/o CD4 270 with vl of 10,387.  Just started on appropriate therapy.  Will need a 30 day supply at discharge and need to get ADAP/drug assistance program.  He also will need [facility] case management due to unstable housing. CM contacted [facility]/[name] [phone] and confirmed service initiation can be started via the IP ID Consult. [facility] will also provide  bridge counselor, housing assistance (Patient currenly living in shelter with his friend) CM confirmed [facility] will provide services to uninsured and also connect to the [employer] [facility] through the [employer] [place] Dispo Plan:  In progress     Luverta Korte RN, BSN, MSHL, CCM  Nurse - Case Manager,  (Unit [place])  [phone]  12/08/2013 IM:  N/a - patient is self pay Social:   Patient from [place] and plans to move to [place].  States he has friends in [place] he can live with. Patient states he has not been taking any medications in over one year and all the past meds he has been on previously came from a pharmacy on [address], [place], [place] or through the ER Department. Patient states he needs to get contacted with HIV Clinic in [place] and will need medication assistance.  CM Research: [facility]  [address] [place], [place] [address]  [phone]  Fax [phone]  [facility]  [address] [place], [place] [address]  [phone]  Fax [phone]  Disposition Plan:  In Progress

## 2013-12-08 NOTE — Progress Notes (Addendum)
Inpatient Diabetes Program Recommendations  AACE/ADA: New Consensus Statement on Inpatient Glycemic Control (2013)  Target Ranges:  Prepandial:   less than 140 mg/dL      Peak postprandial:   less than 180 mg/dL (1-2 hours)      Critically ill patients:  140 - 180 mg/dL   Reason for Visit: Diabetes Coordinator Consult regarding diabetes  Diabetes history: Type 2 Outpatient Diabetes medications: None in the last 1 1/2 month Current orders for Inpatient glycemic control: Novolog moderate correction tid  Results for Shaun Brennan, Shaun Brennan (MRN [id number]) as of 12/08/2013 10:53  Ref. Range 12/07/2013 06:10 12/07/2013 11:30 12/07/2013 16:31 12/07/2013 20:41 12/08/2013 05:54  Glucose-Capillary Latest Range: 70-99 mg/dL 735 (H) 329 (H) 924 (H) 369 (H) 248 (H)   Note:  Assessed patient at bedside.  As I talked with patient, he seemed to become impatient/restless with my questions.  Seemed to be frustrated in trying to remember past treatments, "I don't know all the names of the medicines I took-- I just took them."  Shortened visit and tried to keep questions at a minimum.  Patient has been on insulin in the past "for a short time" -- ? Name or type, but took every morning with breakfast and at bedtime.  Wasn't cloudy-- so suspect it was either Lantus or Levemir.  Most recent medication for diabetes was Metformin-- but hasn't taken for at least 1 1/2 month.  "I just want to get set up with a doctor and take care of these medical problems so I don't have to go through all this again.  I'll do and take whatever I need to for the diabetes.  Of course, I would rather not take insulin but will do it if I have too."  Gave patient information regarding a generic meter from [employer].  Instructed patient in accessing the diabetes videos on the Patient Education Channel.  Note Care Manager to see and address discharge planning issues-- medications, shelter, food sources, etc.  At discharge, request MD consider at  least restarting patient on Metformin.  Not sure regarding whether or not prednisone will be continued at discharge.  If steroids to continue, MD may want to consider either some very low dose 70/30 BID at breakfast and supper, or very low dose NPH BID morning and HS at least temporarily-- but may be too risky if patient does not have a consistent source of food.  MD will need to weigh potential benefits of insulin against risk of hypoglycemia. Thank you.  Nekeya Briski S. Shaun Lincoln, RN, CNS, CDE Inpatient Diabetes Program, team pager [phone]  Addendum:  Patient states he would like to see the "walking doctor" (PT) again.  Has decided that a walking cane would be beneficial after all.

## 2013-12-09 ENCOUNTER — Encounter (HOSPITAL_COMMUNITY): Payer: Self-pay | Admitting: Anesthesiology

## 2013-12-09 DIAGNOSIS — I5023 Acute on chronic systolic (congestive) heart failure: Principal | ICD-10-CM

## 2013-12-09 LAB — GLUCOSE, CAPILLARY
GLUCOSE-CAPILLARY: 198 mg/dL — AB (ref 70–99)
GLUCOSE-CAPILLARY: 288 mg/dL — AB (ref 70–99)
Glucose-Capillary: 235 mg/dL — ABNORMAL HIGH (ref 70–99)
Glucose-Capillary: 318 mg/dL — ABNORMAL HIGH (ref 70–99)

## 2013-12-09 LAB — BASIC METABOLIC PANEL
ANION GAP: 11 (ref 5–15)
BUN: 19 mg/dL (ref 6–23)
CHLORIDE: 96 meq/L (ref 96–112)
CO2: 25 mEq/L (ref 19–32)
CREATININE: 1.07 mg/dL (ref 0.50–1.35)
Calcium: 9.3 mg/dL (ref 8.4–10.5)
GFR calc non Af Amer: 88 mL/min — ABNORMAL LOW (ref >90)
Glucose, Bld: 229 mg/dL — ABNORMAL HIGH (ref 70–99)
POTASSIUM: 5 meq/L (ref 3.7–5.3)
Sodium: 132 mEq/L — ABNORMAL LOW (ref 137–147)

## 2013-12-09 LAB — CBC
HCT: 38.8 % — ABNORMAL LOW (ref 39.0–52.0)
Hemoglobin: 13.5 g/dL (ref 13.0–17.0)
MCH: 32.8 pg (ref 26.0–34.0)
MCHC: 34.8 g/dL (ref 30.0–36.0)
MCV: 94.4 fL (ref 78.0–100.0)
Platelets: 122 10*3/uL — ABNORMAL LOW (ref 150–400)
RBC: 4.11 MIL/uL — AB (ref 4.22–5.81)
RDW: 12.7 % (ref 11.5–15.5)
WBC: 6.8 10*3/uL (ref 4.0–10.5)

## 2013-12-09 MED ORDER — LISINOPRIL 10 MG PO TABS
10.0000 mg | ORAL_TABLET | Freq: Every day | ORAL | Status: DC
  Administered 2013-12-09 – 2013-12-10 (×2): 10 mg via ORAL
  Filled 2013-12-09 (×2): qty 1

## 2013-12-09 MED ORDER — HYDRALAZINE HCL 25 MG PO TABS
25.0000 mg | ORAL_TABLET | Freq: Three times a day (TID) | ORAL | Status: DC
  Administered 2013-12-09 – 2013-12-10 (×4): 25 mg via ORAL
  Filled 2013-12-09 (×6): qty 1

## 2013-12-09 MED ORDER — ISOSORBIDE MONONITRATE ER 30 MG PO TB24
30.0000 mg | ORAL_TABLET | Freq: Every day | ORAL | Status: DC
  Administered 2013-12-09 – 2013-12-10 (×2): 30 mg via ORAL
  Filled 2013-12-09 (×2): qty 1

## 2013-12-09 MED ORDER — CARVEDILOL 6.25 MG PO TABS
6.2500 mg | ORAL_TABLET | Freq: Two times a day (BID) | ORAL | Status: DC
  Administered 2013-12-09 – 2013-12-10 (×2): 6.25 mg via ORAL
  Filled 2013-12-09 (×4): qty 1

## 2013-12-09 NOTE — Progress Notes (Addendum)
Heart Failure Navigator Consult Note  Presentation: Shaun Brennan is a 38 y.o. male with a history of HIV diagnosed approximately 10 years ago not been on any medication, diabetes mellitus, hypertension, history of CHF who states has been out of his medications for approximately a month recently moved from [place] to [place] one day prior to admission currently homeless, and presented to the emergency room with a one-month history of a productive cough occasionally clear sometimes yellowish and one episode of bloody, shortness of breath, chest pain associated with hacking cough. Patient stated that his symptoms worsened and subsequently presented to the ED. Patient denies any fevers, no chills, no abdominal pain, no diarrhea, no constipation, no dysuria, no melena, no hematemesis, no hematochezia. Patient does endorse congestion, nausea, generalized weakness. Patient denies any emesis.  Patient was seen in the emergency room, comprehensive metabolic profile done at a sodium of 136 glucose of 202 albumin of 3.3 AST of 48 otherwise was within normal limits. Pro BNP was elevated at 1289. Lactic acid level is within normal limits at 1.95. Point-of-care cardiac marker was negative. CBC obtained had a platelet of 134 otherwise was unremarkable. Chest x-ray which was done, showed cardiomegaly with mild right base infiltrate which cannot be excluded. Patient was given a dose of IV Rocephin and IV azithromycin in the emergency room.  We were called to admit the patient for further evaluation and management.    Past Medical History  Diagnosis Date  . HIV (human immunodeficiency virus infection)   . Diabetes mellitus without complication   . Hypertension   . Chronic systolic heart failure   . DM2 (diabetes mellitus, type 2)     a. EF 15-20%, grade II DD, LA mild/mod dilated    History   Social History  . Marital Status: Single    Spouse Name: N/A    Number of Children: N/A  . Years of Education:  N/A   Social History Main Topics  . Smoking status: Current Every Day Smoker -- 0.25 packs/day    Types: Cigarettes  . Smokeless tobacco: None  . Alcohol Use: Yes     Comment: socially about 12 pack a weekend  . Drug Use: Yes    Special: Marijuana  . Sexual Activity: Yes   Other Topics Concern  . None   Social History Narrative   Currently homeless    ECHO:Study Conclusions--12/08/13  - Left ventricle: The cavity size was severely dilated. There was mild concentric hypertrophy. Systolic function was severely reduced. The estimated ejection fraction was in the range of 15-20%. Wall motion was normal; there were no regional wall motion abnormalities. Features are consistent with a pseudonormal left ventricular filling pattern, with concomitant abnormal relaxation and increased filling pressure (grade 2 diastolic dysfunction). Doppler parameters are consistent with elevated ventricular end-diastolic filling pressure. - Aortic valve: Trileaflet; normal thickness leaflets. There was no regurgitation. - Aortic root: The aortic root was normal in size. - Mitral valve: There was no regurgitation. - Left atrium: The atrium was mildly to moderately dilated. - Right ventricle: Systolic function was normal. - Right atrium: The atrium was normal in size. - Tricuspid valve: There was no regurgitation. - Pulmonary arteries: Systolic pressure can&'t be assessed on current study. - Pericardium, extracardiac: There was no pericardial effusion.  Impressions:  - Severely dilated left ventricle with severely decreased systolic function and grade 2 diastolic dysfunction with elevated filling pressures. Normal right ventricualr size and function.  Transthoracic echocardiography. M-mode, complete 2D, spectral Doppler, and color  Doppler. Birthdate: Patient birthdate: January 06, 1976. Age: Patient is 38 yr old. Sex: Gender: male. BMI: 33.3 kg/m^2. Blood pressure: 149/110 Patient  status: Inpatient. Study date: Study date: 12/08/2013. Study time: 08:18 AM. Location: Bedside.   BNP    Component Value Date/Time   PROBNP 1289.0* 12/06/2013 0920    Education Assessment and Provision:  Detailed education and instructions provided on heart failure disease management including the following:  Signs and symptoms of Heart Failure When to call the physician Importance of daily weights Low sodium diet Fluid restriction Medication management Anticipated future follow-up appointments  Patient education given on each of the above topics.  Patient acknowledges understanding and acceptance of all instructions.  Mr. Tribby and I spent some time discussing his HF diagnosis.  He is relocating to [place] from [place].  His partner is searching for housing as they are currently living in shelter.  He has not been taking medications secondary to finances.  He also admits to not weighing daily and does not own a scale. He says that he did realize the association between his weights and his HF.  He seems to now understand --I reinforced the importance of daily weights, HF symptoms and when to call the physician.  He would like for me to hold his scale until he can find stable housing secondary to the fact that he is concerned about someone stealing it at the shelter.  He mentions concerns regarding a low sodium diet because they are now forced to eat "whatever they can get."  I asked him to make better choices as it is possible.   Education Materials:  "Living Better With Heart Failure" Booklet, Daily Weight Tracker Tool .   High Risk Criteria for Readmission and/or Poor Patient Outcomes:  (Recommend Follow-up with [facility])--yes outpatient    EF <30%-  Yes-15-20% and Grade 2 dias dys.  2 or more admissions in 6 months- No--recently relocated  Difficult social situation- Yes--currently homeless with no income  Demonstrates medication noncompliance- yes  secondary to finances    Barriers of Care:  Knowledge, compliance, finances  Discharge Planning:   Plans to discharge to shelter until he can find other housing--He will benefit from outpatient SW, ongoing education and compliance reinforcement.

## 2013-12-09 NOTE — Progress Notes (Addendum)
[facility] PROGRESS NOTE  Shaun Brennan ZOX:[id number] DOB: 1976/02/24 DOA: 12/06/2013 PCP: No primary provider on file.  Brief Narrative: Shaun Brennan is a 38 y.o. male with a history of HIV diagnosed approximately 10 years ago not been on any medication, diabetes mellitus, hypertension, history of CHF who states has been out of his medications for approximately a month recently moved from [place] to [place] [employer] one day prior to admission currently homeless, and presented to the emergency room with a one-month history of a productive cough occasionally clear sometimes yellowish and one episode of bloody, shortness of breath, chest pain associated with hacking cough. Patient stated that his symptoms worsened and subsequently presented to the ED. -Treated for Bronchitis, hypoxia and found to have EF of 15%  -Cards and ID following  Assessment/Plan: #1 Bronchitis, cough, hypoxia -was on Rocephin/Zirthomax and bactrim IV-all stopped per ID based on CT , FU PCP DFA -mucinex, nebs, supportive care -improved weaned off O2 -wean off prednisone -Sputum Cx c/w oral flora, urine legionella and Pneumo Ag negative   #2 malignant hypertension  -improved, but still up, continue metoprolol, add lisinopril  -hydralazine PRN  #3 DM 2/uncontrolled - AiC 9.7, SSI for now, -also on prednisone for 1, last dose completed - oral meds at DC  #4 history of CHF/cardiomegaly  -2-D echo with EF of 15-20% and grade 2DD -volume status even, add lisinopril -most likely NICM, he denies having any workup namely stress test or cath ever, known CHF, was admitted at [facility] in past will request records -will ask Cards to eval, needs to be plugged in to CHF service too -add on BNP  #5 history of HIV x10 years per patient  -reports never being on hAART -CD4 count 270, viral load 10K -started on HAART, needs ADAP, case management notified  #6 Homelessness -CSW consulted   DVT prooph: Lovenox   Code  Status:Full Code Family Communication: none at bedside Disposition Plan: homeless, CSW consult  Consultants:  ID  HPI/Subjective: Cough, congestion and breathing better  Objective: Filed Vitals:   12/09/13 0545  BP: 146/105  Pulse: 87  Temp: 97.9 F (36.6 C)  Resp: 18    Intake/Output Summary (Last 24 hours) at 12/09/13 0944 Last data filed at 12/09/13 0851  Gross per 24 hour  Intake    960 ml  Output   1875 ml  Net   -915 ml   Filed Weights   12/07/13 0428 12/08/13 0450 12/09/13 0545  Weight: 90.674 kg (199 lb 14.4 oz) 91.037 kg (200 lb 11.2 oz) 90.1 kg (198 lb 10.2 oz)    Exam:   General:  AAOx3, ill appearing  Cardiovascular: S1S2/RRR  Respiratory: lungs clear today  Abdomen: soft, Nt, SB present  Musculoskeletal:some chronic R ankle swelling  Data Reviewed: Basic Metabolic Panel:  Recent Labs Lab 12/06/13 0920 12/06/13 1405 12/07/13 0445 12/08/13 0713 12/09/13 0409  NA 136*  --  127* 130* 132*  K 4.3  --  4.4 4.8 5.0  CL 96  --  90* 95* 96  CO2 26  --  GLUCOSE 202*  --  266* 214* 229*  BUN 7  --  CREATININE 0.82  --  0.86 1.11 1.07  CALCIUM 8.7  --  8.8 8.9 9.3  MG  --  1.3* 1.9  --   --    Liver Function Tests:  Recent Labs Lab 12/06/13 0920 12/07/13 0445  AST 48* 35  ALT 47 35  ALKPHOS 82 78  BILITOT 0.5 0.4  PROT 7.9 7.9  ALBUMIN 3.3* 2.9*   No results found for this basename: LIPASE, AMYLASE,  in the last 168 hours No results found for this basename: AMMONIA,  in the last 168 hours CBC:  Recent Labs Lab 12/06/13 0920 12/07/13 0445 12/09/13 0409  WBC 5.7 5.8 6.8  NEUTROABS 4.4 4.7  --   HGB 13.5 13.6 13.5  HCT 38.7* 38.9* 38.8*  MCV 94.6 94.2 94.4  PLT 134* 131* 122*   Cardiac Enzymes:  Recent Labs Lab 12/06/13 1615 12/06/13 2200 12/07/13 0414  TROPONINI <0.30 <0.30 <0.30   BNP (last 3 results)  Recent Labs  12/06/13 0920  PROBNP 1289.0*   CBG:  Recent Labs Lab 12/08/13 0554  12/08/13 1105 12/08/13 1630 12/08/13 2101 12/09/13 0556  GLUCAP 248* 231* 296* 187* 198*    Recent Results (from the past 240 hour(s))  CULTURE, BLOOD (ROUTINE X 2)     Status: None   Collection Time    12/06/13  9:20 AM      Result Value Ref Range Status   Specimen Description BLOOD RIGHT ARM   Final   Special Requests BOTTLES DRAWN AEROBIC AND ANAEROBIC   Final   Culture  Setup Time     Final   Value: 12/06/2013 18:14     Performed at [facility]   Culture     Final   Value:        BLOOD CULTURE RECEIVED NO GROWTH TO DATE CULTURE WILL BE HELD FOR 5 DAYS BEFORE ISSUING A FINAL NEGATIVE REPORT     Performed at [facility]   Report Status PENDING   Incomplete  CULTURE, BLOOD (ROUTINE X 2)     Status: None   Collection Time    12/06/13  9:30 AM      Result Value Ref Range Status   Specimen Description BLOOD LEFT HAND   Final   Special Requests BOTTLES DRAWN AEROBIC AND ANAEROBIC 5CC   Final   Culture  Setup Time     Final   Value: 12/06/2013 18:14     Performed at [facility]   Culture     Final   Value:        BLOOD CULTURE RECEIVED NO GROWTH TO DATE CULTURE WILL BE HELD FOR 5 DAYS BEFORE ISSUING A FINAL NEGATIVE REPORT     Performed at [facility]   Report Status PENDING   Incomplete  URINE CULTURE     Status: None   Collection Time    12/06/13 12:58 PM      Result Value Ref Range Status   Specimen Description URINE, RANDOM   Final   Special Requests NONE   Final   Culture  Setup Time     Final   Value: 12/06/2013 22:03     Performed at [facility] Count     Final   Value: 1,000 COLONIES/ML     Performed at [facility]   Culture     Final   Value: INSIGNIFICANT GROWTH     Performed at [facility]   Report Status 12/07/2013 FINAL   Final  CULTURE, EXPECTORATED SPUTUM-ASSESSMENT     Status: None   Collection Time    12/06/13 10:35 PM      Result Value Ref Range Status   Specimen  Description SPUTUM   Final   Special Requests NONE   Final  Sputum evaluation     Final   Value: MICROSCOPIC FINDINGS SUGGEST THAT THIS SPECIMEN IS NOT REPRESENTATIVE OF LOWER RESPIRATORY SECRETIONS. PLEASE RECOLLECT.     Results Called toNorton Pastel [id number] WilderK   Report Status 12/06/2013 FINAL   Final     Studies: Ct Angio Chest Pe W/cm &/or Wo Cm  12/07/2013   CLINICAL DATA:  Cough.  Hypoxia.  EXAM: CT ANGIOGRAPHY CHEST WITH CONTRAST  TECHNIQUE: Multidetector CT imaging of the chest was performed using the standard protocol during bolus administration of intravenous contrast. Multiplanar CT image reconstructions and MIPs were obtained to evaluate the vascular anatomy.  CONTRAST:  80mL OMNIPAQUE IOHEXOL 350 MG/ML SOLN  COMPARISON:  Chest x-ray 12/28/2013.  FINDINGS: Pulmonary arteries are normal. No pulmonary embolus. Thoracic aorta normal caliber. Severe cardiomegaly. Mild pericardial thickening. Tiny pericardial effusion cannot be excluded. Coronary artery disease.  Shotty mediastinal lymph nodes. Small sliding hiatal hernia. Small amount of fluid noted in the distal thoracic esophagus. Gastroesophageal reflux may be present.  Large airways are patent. No focal pulmonary alveolar infiltrates are noted. No pleural effusion or pneumothorax. Multiple tiny subcentimeter nodules are noted throughout both lungs, the majority of which are calcified. These findings are consistent with prior granulomas disease.  Visualized upper abdominal viscera unremarkable.  Thyroid normal size. No significant axillary adenopathy. No acute bony abnormality.  Review of the MIP images confirms the above findings.  IMPRESSION: 1. No evidence of pulmonary embolus. 2. Severe cardiomegaly. Tiny pericardial effusion cannot be excluded. Coronary artery disease. 3. Evidence of prior granulomatous disease. 4. Tiny sliding hiatal hernia. Fluid in the distal thoracic esophagus. Gastroesophageal reflux could present this  fashion.   Electronically Signed   By: Maisie Fus  Register   On: 12/07/2013 14:03    Scheduled Meds: . amLODipine  10 mg Oral Daily  . antiseptic oral rinse  7 mL Mouth Rinse BID  . dolutegravir  50 mg Oral Daily  . emtricitabine-tenofovir  1 tablet Oral Daily  . enoxaparin (LOVENOX) injection  40 mg Subcutaneous Q24H  . famotidine  20 mg Oral Daily  . guaiFENesin  1,200 mg Oral BID  . insulin aspart  0-15 Units Subcutaneous TID WC  . metoprolol tartrate  25 mg Oral BID   Continuous Infusions:   Antibiotics Given (last 72 hours)   Date/Time Action Medication Dose Rate   12/06/13 1718 Given   sulfamethoxazole-trimethoprim (BACTRIM) 480 mg of trimethoprim in dextrose 5 % 500 mL IVPB 480 mg of trimethoprim 353.3 mL/hr   12/07/13 0100 Given   sulfamethoxazole-trimethoprim (BACTRIM) 480 mg of trimethoprim in dextrose 5 % 500 mL IVPB 480 mg of trimethoprim 353.3 mL/hr   12/07/13 0946 Given  [non on floor at scheduled time]   sulfamethoxazole-trimethoprim (BACTRIM) 480 mg of trimethoprim in dextrose 5 % 500 mL IVPB 480 mg of trimethoprim 353.3 mL/hr   12/07/13 1501 Given   cefTRIAXone (ROCEPHIN) 1 g in dextrose 5 % 50 mL IVPB 1 g 100 mL/hr   12/07/13 1633 Given   azithromycin (ZITHROMAX) 500 mg in dextrose 5 % 250 mL IVPB 500 mg 250 mL/hr   12/07/13 2045 Given   dolutegravir (TIVICAY) tablet 50 mg 50 mg    12/07/13 2045 Given   emtricitabine-tenofovir (TRUVADA) 200-300 MG per tablet 1 tablet 1 tablet    12/08/13 0943 Given   dolutegravir (TIVICAY) tablet 50 mg 50 mg    12/08/13 0943 Given   emtricitabine-tenofovir (TRUVADA) 200-300 MG per tablet 1 tablet 1 tablet  Principal Problem:   Acute respiratory distress Active Problems:   CAP (community acquired pneumonia)   Malignant hypertension   DM2 (diabetes mellitus, type 2)   HIV (human immunodeficiency virus infection)   H/O CHF   Cardiomegaly: per cxr 12/06/13    Time spent:    [facility]  [facility] Pager [phone]. If 7PM-7AM, please contact night-coverage at [email or web address], password [email or web address] 12/09/2013, 9:44 AM  LOS: 3 days

## 2013-12-09 NOTE — Consult Note (Signed)
Advanced Heart Failure Team Consult Note  Referring Physician: Dr. Jomarie Longs Primary Physician: N/A Primary Cardiologist:  N/A  Reason for Consultation: A/C HF  HPI:    Mr. Applegate is a 38 yo male with a history of HIV, DM2, HTN and chronic systolic heart failure. He is homeless and reports just moving here from [place]. Of note he has been out of his medications for about 3 months and he has never been treated for HIV.   Prior to admission reports that he he has had SOB, productive clear cough and LE edema that is worse than normal for about 1 month. Pertinent labs on admission were pro-BNP 1289, creatinine 0.82, Hgb 13.5, negative Troponin, >1000 on urinalysis with >300 protein, CD4 270, HIV 1 RNA Quant 10387, Mag 1.3, Hgb A1c 9.3. Hypoxic on admission with sats in the 80s. SBP 210/110  Negative Hep B surface antigen, HCV AB and RPR non-reactive.   ECHO: EF 15-20%, grade II DD, mild/mod LA, RV normal  Review of Systems: [y] = yes,  = no   General: Weight gain ; Weight loss ; Anorexia ; Fatigue [ Y]; Fever ; Chills ; Weakness   Cardiac: Chest pain/pressure [ N]; Resting SOB ; Exertional SOB [ Y]; Orthopnea ; Pedal Edema [Y ]; Palpitations ; Syncope ; Presyncope ; Paroxysmal nocturnal dyspnea[ ]   Pulmonary: Cough [Y ]; Wheezing[ ] ; Hemoptysis[ ] ; Sputum ; Snoring   GI: Vomiting[ ] ; Dysphagia[ ] ; Melena[ ] ; Hematochezia ; Heartburn[ ] ; Abdominal pain ; Constipation ; Diarrhea ; BRBPR   GU: Hematuria[ ] ; Dysuria ; Nocturia[ ]   Vascular: Pain in legs with walking ; Pain in feet with lying flat ; Non-healing sores ; Stroke ; TIA ; Slurred speech ;  Neuro: Headaches[ ] ; Vertigo[ ] ; Seizures[ ] ; Paresthesias[ ] ;Blurred vision ; Diplopia ; Vision changes   Ortho/Skin: Arthritis [ Y]; Joint pain ; Muscle pain ; Joint swelling ; Back Pain ; Rash   Psych: Depression[ ] ; Anxiety[ ]   Heme: Bleeding  problems ; Clotting disorders ; Anemia   Endocrine: Diabetes ; Thyroid dysfunction[ ]   Home Medications Prior to Admission medications   Medication Sig Start Date End Date Taking? Authorizing Provider  aspirin 325 MG tablet Take 650 mg by mouth every 6 (six) hours as needed for mild pain.   Yes Historical Provider, MD    Past Medical History: Past Medical History  Diagnosis Date  . HIV (human immunodeficiency virus infection)   . Diabetes mellitus without complication   . Hypertension   . Chronic systolic heart failure   . DM2 (diabetes mellitus, type 2)     a. EF 15-20%, grade II DD, LA mild/mod dilated    Past Surgical History: Past Surgical History  Procedure Laterality Date  . Ankle surgery Right     Family History: Family History  Problem Relation Age of Onset  . Other Father     Social History: History   Social History  . Marital Status: Single    Spouse Name: N/A    Number of Children: N/A  . Years of Education: N/A   Social History Main Topics  . Smoking status: Current Every Day Smoker -- 0.25 packs/day    Types: Cigarettes  . Smokeless tobacco: None  . Alcohol Use: Yes  Comment: socially about 12 pack a weekend  . Drug Use: Yes    Special: Marijuana  . Sexual Activity: Yes   Other Topics Concern  . None   Social History Narrative   Currently homeless    Allergies:  No Known Allergies  Objective:    Vital Signs:   Temp:  [97.8 F (36.6 C)-98 F (36.7 C)] 97.9 F (36.6 C) (09/01 0545) Pulse Rate:  [87-102] 87 (09/01 0545) Resp:  [18] 18 (09/01 0545) BP: (146-154)/(90-107) 146/105 mmHg (09/01 0545) SpO2:  [98 %-100 %] 100 % (09/01 0545) Weight:  [198 lb 10.2 oz (90.1 kg)] 198 lb 10.2 oz (90.1 kg) (09/01 0545) Last BM Date: 12/07/13  Weight change: Filed Weights   12/07/13 0428 12/08/13 0450 12/09/13 0545  Weight: 199 lb 14.4 oz (90.674 kg) 200 lb 11.2 oz (91.037 kg) 198 lb 10.2 oz (90.1 kg)     Intake/Output:   Intake/Output Summary (Last 24 hours) at 12/09/13 1339 Last data filed at 12/09/13 1322  Gross per 24 hour  Intake   1400 ml  Output   2325 ml  Net   -925 ml     Physical Exam: General:  Well appearing. No resp difficulty, sitting on side of bed HEENT: normal Neck: supple. JVP 7. Carotids 2+ bilat; no bruits. No lymphadenopathy or thryomegaly appreciated. Cor: PMI nondisplaced. Regular rate & rhythm. No rubs, gallops or murmurs. Lungs: clear Abdomen: soft, nontender, nondistended. No hepatosplenomegaly. No bruits or masses. Good bowel sounds. Extremities: no cyanosis, clubbing, rash, edema Neuro: alert & orientedx3, cranial nerves grossly intact. moves all 4 extremities w/o difficulty. Affect pleasant  Telemetry:   Labs: Basic Metabolic Panel:  Recent Labs Lab 12/06/13 0920 12/06/13 1405 12/07/13 0445 12/08/13 0713 12/09/13 0409  NA 136*  --  127* 130* 132*  K 4.3  --  4.4 4.8 5.0  CL 96  --  90* 95* 96  CO2 26  --  GLUCOSE 202*  --  266* 214* 229*  BUN 7  --  CREATININE 0.82  --  0.86 1.11 1.07  CALCIUM 8.7  --  8.8 8.9 9.3  MG  --  1.3* 1.9  --   --     Liver Function Tests:  Recent Labs Lab 12/06/13 0920 12/07/13 0445  AST 48* 35  ALT 47 35  ALKPHOS 82 78  BILITOT 0.5 0.4  PROT 7.9 7.9  ALBUMIN 3.3* 2.9*   No results found for this basename: LIPASE, AMYLASE,  in the last 168 hours No results found for this basename: AMMONIA,  in the last 168 hours  CBC:  Recent Labs Lab 12/06/13 0920 12/07/13 0445 12/09/13 0409  WBC 5.7 5.8 6.8  NEUTROABS 4.4 4.7  --   HGB 13.5 13.6 13.5  HCT 38.7* 38.9* 38.8*  MCV 94.6 94.2 94.4  PLT 134* 131* 122*    Cardiac Enzymes:  Recent Labs Lab 12/06/13 1615 12/06/13 2200 12/07/13 0414  TROPONINI <0.30 <0.30 <0.30    BNP: BNP (last 3 results)  Recent Labs  12/06/13 0920  PROBNP 1289.0*    CBG:  Recent Labs Lab 12/08/13 1105 12/08/13 1630  12/08/13 2101 12/09/13 0556 12/09/13 1057  GLUCAP 231* 296* 187* 198* 235*    Coagulation Studies:  Recent Labs  12/06/13 1405  LABPROT 13.7  INR 1.05    Other results: EKG: ST 104 biatrial enlargement  Imaging: Ct Angio Chest Pe W/cm &/or Wo Cm  12/07/2013  CLINICAL DATA:  Cough.  Hypoxia.  EXAM: CT ANGIOGRAPHY CHEST WITH CONTRAST  TECHNIQUE: Multidetector CT imaging of the chest was performed using the standard protocol during bolus administration of intravenous contrast. Multiplanar CT image reconstructions and MIPs were obtained to evaluate the vascular anatomy.  CONTRAST:  80mL OMNIPAQUE IOHEXOL 350 MG/ML SOLN  COMPARISON:  Chest x-ray 12/28/2013.  FINDINGS: Pulmonary arteries are normal. No pulmonary embolus. Thoracic aorta normal caliber. Severe cardiomegaly. Mild pericardial thickening. Tiny pericardial effusion cannot be excluded. Coronary artery disease.  Shotty mediastinal lymph nodes. Small sliding hiatal hernia. Small amount of fluid noted in the distal thoracic esophagus. Gastroesophageal reflux may be present.  Large airways are patent. No focal pulmonary alveolar infiltrates are noted. No pleural effusion or pneumothorax. Multiple tiny subcentimeter nodules are noted throughout both lungs, the majority of which are calcified. These findings are consistent with prior granulomas disease.  Visualized upper abdominal viscera unremarkable.  Thyroid normal size. No significant axillary adenopathy. No acute bony abnormality.  Review of the MIP images confirms the above findings.  IMPRESSION: 1. No evidence of pulmonary embolus. 2. Severe cardiomegaly. Tiny pericardial effusion cannot be excluded. Coronary artery disease. 3. Evidence of prior granulomatous disease. 4. Tiny sliding hiatal hernia. Fluid in the distal thoracic esophagus. Gastroesophageal reflux could present this fashion.   Electronically Signed   By: Maisie Fus  Register   On: 12/07/2013 14:03     Medications:      Current Medications: . antiseptic oral rinse  7 mL Mouth Rinse BID  . carvedilol  6.25 mg Oral BID WC  . dolutegravir  50 mg Oral Daily  . emtricitabine-tenofovir  1 tablet Oral Daily  . enoxaparin (LOVENOX) injection  40 mg Subcutaneous Q24H  . famotidine  20 mg Oral Daily  . guaiFENesin  1,200 mg Oral BID  . hydrALAZINE  25 mg Oral 3 times per day  . insulin aspart  0-15 Units Subcutaneous TID WC  . isosorbide mononitrate  30 mg Oral Daily  . lisinopril  10 mg Oral Daily    Infusions:     Assessment:   1) A/C Systolic HF - EF 78-29% 2) HTN 3) HIV - CD4 count 270, never been treated 4) DM2 5) Social issues, homeless 6) Hypoxia  Plan/Discussion:    Mr. Dreibelbis is a very pleasant 38 yo male with a history of chronic systolic HF (never had a cath), HTN, HIV and DM2.   He presented to the hospital with A/C HF after being out of his medications for a couple of months. His volume status is stable. Before going home would provide him with lasix 40 mg PRN for weight gain more than 3 lbs in a day or 5 lbs in week and will provide a scale.  Would switch from metoprolol to coreg 6.25 mg BID. Stop norvasc and start hydralazine 25 mg TID with Imdur 30 mg daily. Will not start spiro with K+ being 5.0. Continue lisinopril 10 mg daily. Will follow up in the HF clinic and will provide his HF medications. Will set up for CSW to meet on OP side.    Hopefully home tomorrow.  Length of Stay: 3 [name] NP-C 12/09/2013, 1:39 PM  Advanced Heart Failure Team Pager [phone] (M-F; 7a - 4p)  Please contact [facility] for night-coverage after hours (4p -7a ) and weekends on [email or web address]  Patient seen and examined with Ulla Potash, NP. We discussed all aspects of the encounter. I agree with the assessment and plan as  stated above.   Suspect NICM due to HTN +/- HIV. Currently well compensated. Has been unable to get meds. Will switch metoprolol to carvedilol.  Continue lisinopril  (dose limited by hyperK+). Stop amlodipine. Switch to hydralazine and nitrates.   HF team will follow. Hopefully we can get him home tomorrow.   Addysin Porco,MD 1:41 PM

## 2013-12-09 NOTE — Progress Notes (Signed)
Pt requested chaplain services because of major life changes. Pt is homeless and has not been taking care of himself. Pt felt overwhelmed and depressed. Pt has a friend who is trying to locate housing while he is in the hospital. Chaplain provided a listening ear and prayer and spoke with family friend.  Cindie Crumbly, Chaplain  12/09/13 1300  Clinical Encounter Type  Visited With Patient  Visit Type Social support;Spiritual support  Referral From Nurse  Consult/Referral To Chaplain  Spiritual Encounters  Spiritual Needs Prayer;Emotional  Stress Factors  Patient Stress Factors Financial concerns;Health changes;Major life changes  Family Stress Factors None identified

## 2013-12-09 NOTE — Progress Notes (Signed)
INFECTIOUS DISEASE PROGRESS NOTE  ID: Shaun Brennan is a 38 y.o. male with  Principal Problem:   Acute respiratory distress Active Problems:   CAP (community acquired pneumonia)   Malignant hypertension   DM2 (diabetes mellitus, type 2)   HIV (human immunodeficiency virus infection)   H/O CHF   Cardiomegaly: per cxr 12/06/13  Subjective: Without complaints, no SOB  Abtx:  Anti-infectives   Start     Dose/Rate Route Frequency Ordered Stop   12/07/13 2100  dolutegravir (TIVICAY) tablet 50 mg     50 mg Oral Daily 12/07/13 2008     12/07/13 2100  emtricitabine-tenofovir (TRUVADA) 200-300 MG per tablet 1 tablet     1 tablet Oral Daily 12/07/13 2008     12/07/13 1300  azithromycin (ZITHROMAX) 500 mg in dextrose 5 % 250 mL IVPB  Status:  Discontinued     500 mg 250 mL/hr over 60 Minutes Intravenous Every 24 hours 12/06/13 1439 12/07/13 1738   12/07/13 1200  cefTRIAXone (ROCEPHIN) 1 g in dextrose 5 % 50 mL IVPB  Status:  Discontinued     1 g 100 mL/hr over 30 Minutes Intravenous Every 24 hours 12/06/13 1439 12/07/13 1738   12/06/13 1600  sulfamethoxazole-trimethoprim (BACTRIM) 480 mg of trimethoprim in dextrose 5 % 500 mL IVPB  Status:  Discontinued     480 mg of trimethoprim 353.3 mL/hr over 90 Minutes Intravenous Every 8 hours 12/06/13 1439 12/07/13 1738   12/06/13 1115  azithromycin (ZITHROMAX) 500 mg in dextrose 5 % 250 mL IVPB     500 mg 250 mL/hr over 60 Minutes Intravenous  Once 12/06/13 1105 12/06/13 1411   12/06/13 1115  cefTRIAXone (ROCEPHIN) 1 g in dextrose 5 % 50 mL IVPB     1 g 100 mL/hr over 30 Minutes Intravenous  Once 12/06/13 1105 12/06/13 1257      Medications:  Scheduled: . antiseptic oral rinse  7 mL Mouth Rinse BID  . carvedilol  6.25 mg Oral BID WC  . dolutegravir  50 mg Oral Daily  . emtricitabine-tenofovir  1 tablet Oral Daily  . enoxaparin (LOVENOX) injection  40 mg Subcutaneous Q24H  . famotidine  20 mg Oral Daily  . guaiFENesin  1,200 mg Oral  BID  . hydrALAZINE  25 mg Oral 3 times per day  . insulin aspart  0-15 Units Subcutaneous TID WC  . isosorbide mononitrate  30 mg Oral Daily  . lisinopril  10 mg Oral Daily    Objective: Vital signs in last 24 hours: Temp:  [97.8 F (36.6 C)-98 F (36.7 C)] 97.9 F (36.6 C) (09/01 0545) Pulse Rate:  [87-102] 87 (09/01 0545) Resp:  [18] 18 (09/01 0545) BP: (146-154)/(90-107) 146/105 mmHg (09/01 0545) SpO2:  [98 %-100 %] 100 % (09/01 0545) Weight:  [90.1 kg (198 lb 10.2 oz)] 90.1 kg (198 lb 10.2 oz) (09/01 0545)   General appearance: alert, cooperative and no distress Throat: normal findings: oropharynx pink & moist without lesions or evidence of thrush Resp: clear to auscultation bilaterally Cardio: regular rate and rhythm GI: normal findings: bowel sounds normal and soft, non-tender Extremities: edema none  Lab Results  Recent Labs  12/07/13 0445 12/08/13 0713 12/09/13 0409  WBC 5.8  --  6.8  HGB 13.6  --  13.5  HCT 38.9*  --  38.8*  NA 127* 130* 132*  K 4.4 4.8 5.0  CL 90* 95* 96  CO2 $Re'22 23 25  'qXg$ BUN $R'9 14 19  'Dm$ CREATININE 0.86  1.11 1.07   Liver Panel  Recent Labs  12/07/13 0445  PROT 7.9  ALBUMIN 2.9*  AST 35  ALT 35  ALKPHOS 78  BILITOT 0.4   Sedimentation Rate No results found for this basename: ESRSEDRATE,  in the last 72 hours C-Reactive Protein No results found for this basename: CRP,  in the last 72 hours  Microbiology: Recent Results (from the past 240 hour(s))  CULTURE, BLOOD (ROUTINE X 2)     Status: None   Collection Time    12/06/13  9:20 AM      Result Value Ref Range Status   Specimen Description BLOOD RIGHT ARM   Final   Special Requests BOTTLES DRAWN AEROBIC AND ANAEROBIC 10ML   Final   Culture  Setup Time     Final   Value: 12/06/2013 18:14     Performed at [facility]   Culture     Final   Value:        BLOOD CULTURE RECEIVED NO GROWTH TO DATE CULTURE WILL BE HELD FOR 5 DAYS BEFORE ISSUING A FINAL NEGATIVE REPORT      Performed at [facility]   Report Status PENDING   Incomplete  CULTURE, BLOOD (ROUTINE X 2)     Status: None   Collection Time    12/06/13  9:30 AM      Result Value Ref Range Status   Specimen Description BLOOD LEFT HAND   Final   Special Requests BOTTLES DRAWN AEROBIC AND ANAEROBIC 5CC   Final   Culture  Setup Time     Final   Value: 12/06/2013 18:14     Performed at [facility]   Culture     Final   Value:        BLOOD CULTURE RECEIVED NO GROWTH TO DATE CULTURE WILL BE HELD FOR 5 DAYS BEFORE ISSUING A FINAL NEGATIVE REPORT     Performed at [facility]   Report Status PENDING   Incomplete  URINE CULTURE     Status: None   Collection Time    12/06/13 12:58 PM      Result Value Ref Range Status   Specimen Description URINE, RANDOM   Final   Special Requests NONE   Final   Culture  Setup Time     Final   Value: 12/06/2013 22:03     Performed at [facility]     Final   Value: 1,000 COLONIES/ML     Performed at [facility]   Culture     Final   Value: INSIGNIFICANT GROWTH     Performed at [facility]   Report Status 12/07/2013 FINAL   Final  CULTURE, EXPECTORATED SPUTUM-ASSESSMENT     Status: None   Collection Time    12/06/13 10:35 PM      Result Value Ref Range Status   Specimen Description SPUTUM   Final   Special Requests NONE   Final   Sputum evaluation     Final   Value: MICROSCOPIC FINDINGS SUGGEST THAT THIS SPECIMEN IS NOT REPRESENTATIVE OF LOWER RESPIRATORY SECRETIONS. PLEASE RECOLLECT.     Results Called toMack Hook [id number] [place]   Report Status 12/06/2013 FINAL   Final    Studies/Results: Ct Angio Chest Pe W/cm &/or Wo Cm  12/07/2013   CLINICAL DATA:  Cough.  Hypoxia.  EXAM: CT ANGIOGRAPHY CHEST WITH CONTRAST  TECHNIQUE: Multidetector CT imaging of the chest  was performed using the standard protocol during bolus administration of intravenous contrast. Multiplanar CT image reconstructions  and MIPs were obtained to evaluate the vascular anatomy.  CONTRAST:  33mL OMNIPAQUE IOHEXOL 350 MG/ML SOLN  COMPARISON:  Chest x-ray 12/28/2013.  FINDINGS: Pulmonary arteries are normal. No pulmonary embolus. Thoracic aorta normal caliber. Severe cardiomegaly. Mild pericardial thickening. Tiny pericardial effusion cannot be excluded. Coronary artery disease.  Shotty mediastinal lymph nodes. Small sliding hiatal hernia. Small amount of fluid noted in the distal thoracic esophagus. Gastroesophageal reflux may be present.  Large airways are patent. No focal pulmonary alveolar infiltrates are noted. No pleural effusion or pneumothorax. Multiple tiny subcentimeter nodules are noted throughout both lungs, the majority of which are calcified. These findings are consistent with prior granulomas disease.  Visualized upper abdominal viscera unremarkable.  Thyroid normal size. No significant axillary adenopathy. No acute bony abnormality.  Review of the MIP images confirms the above findings.  IMPRESSION: 1. No evidence of pulmonary embolus. 2. Severe cardiomegaly. Tiny pericardial effusion cannot be excluded. Coronary artery disease. 3. Evidence of prior granulomatous disease. 4. Tiny sliding hiatal hernia. Fluid in the distal thoracic esophagus. Gastroesophageal reflux could present this fashion.   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 Similar sounding words can inadvertently be transcribed and this note may contain transcription errors which may not have been corrected upon publication of note.**

## 2013-12-10 LAB — GLUCOSE, CAPILLARY
GLUCOSE-CAPILLARY: 210 mg/dL — AB (ref 70–99)
Glucose-Capillary: 198 mg/dL — ABNORMAL HIGH (ref 70–99)

## 2013-12-10 LAB — BASIC METABOLIC PANEL
Anion gap: 9 (ref 5–15)
BUN: 19 mg/dL (ref 6–23)
CO2: 27 meq/L (ref 19–32)
Calcium: 9.4 mg/dL (ref 8.4–10.5)
Chloride: 98 mEq/L (ref 96–112)
Creatinine, Ser: 1.08 mg/dL (ref 0.50–1.35)
GFR calc Af Amer: >90 mL/min (ref >90)
GFR, EST NON AFRICAN AMERICAN: 87 mL/min — AB (ref >90)
GLUCOSE: 233 mg/dL — AB (ref 70–99)
POTASSIUM: 4.8 meq/L (ref 3.7–5.3)
SODIUM: 134 meq/L — AB (ref 137–147)

## 2013-12-10 MED ORDER — CARVEDILOL 6.25 MG PO TABS: 6.2500 mg | ORAL_TABLET | Freq: Two times a day (BID) | ORAL | Status: DC

## 2013-12-10 MED ORDER — DOLUTEGRAVIR SODIUM 50 MG PO TABS: 50.0000 mg | ORAL_TABLET | Freq: Every day | ORAL | Status: DC

## 2013-12-10 MED ORDER — HYDRALAZINE HCL 25 MG PO TABS: 25.0000 mg | ORAL_TABLET | Freq: Three times a day (TID) | ORAL | Status: DC

## 2013-12-10 MED ORDER — ISOSORBIDE MONONITRATE ER 30 MG PO TB24: 30.0000 mg | ORAL_TABLET | Freq: Every day | ORAL | Status: DC

## 2013-12-10 MED ORDER — LISINOPRIL 10 MG PO TABS: 10.0000 mg | ORAL_TABLET | Freq: Every day | ORAL | Status: DC

## 2013-12-10 MED ORDER — CARVEDILOL 6.25 MG PO TABS
9.3750 mg | ORAL_TABLET | Freq: Two times a day (BID) | ORAL | Status: DC
  Filled 2013-12-10 (×2): qty 1

## 2013-12-10 MED ORDER — EMTRICITABINE-TENOFOVIR DF 200-300 MG PO TABS: 1.0000 | ORAL_TABLET | Freq: Every day | ORAL | Status: DC

## 2013-12-10 MED ORDER — METFORMIN HCL 500 MG PO TABS: 500.0000 mg | ORAL_TABLET | Freq: Two times a day (BID) | ORAL | Status: DC

## 2013-12-10 NOTE — Progress Notes (Signed)
UR completed Gregrey Bloyd K. Danaiya Steadman, RN, BSN, MSHL, CCM  12/10/2013 8:16 PM

## 2013-12-10 NOTE — Discharge Summary (Addendum)
Physician Discharge Summary  Gil Ingwersen DSK:[id number] DOB: [birth year] DOA: 12/06/2013  PCP: No primary provider on file.  Admit date: 12/06/2013 Discharge date: 12/10/2013  Time spent: > 35 minutes  Recommendations for Outpatient Follow-up:  1. Continue to encourage compliance with medications.  Discharge Diagnoses:  Please see list below.   Discharge Condition: stable  Diet recommendation: heart healthy/diabetic diet.  Filed Weights   12/08/13 0450 12/09/13 0545 12/10/13 0715  Weight: 91.037 kg (200 lb 11.2 oz) 90.1 kg (198 lb 10.2 oz) 89.404 kg (197 lb 1.6 oz)    History of present illness:  From original HPI: Shaun Brennan is a 38 y.o. male  With a history of HIV diagnosed approximately 10 years ago not been on any medication, diabetes mellitus, hypertension, history of CHF who states has been out of his medications for approximately a month recently moved from [place] to [place] one day prior to admission currently homeless, and presented to the emergency room with a one-month history of a productive cough occasionally clear sometimes yellowish and one episode of bloody, shortness of breath, chest pain associated with hacking cough. Patient stated that his symptoms worsened and subsequently presented to the ED   Hospital Course:  1) Acute systolic HF - Cardiology consulted and patient currently compensated. 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 Currently well compensated. Has been unable to get meds. Will switch metoprolol to carvedilol. Continue lisinopril (dose limited by hyperK+). Stop amlodipine. Switch to hydralazine and nitrates.   2) HIV - ID following patient to f/u with ID after discharge - Will provide scripts for antiviral medications on d/c - ID checked HLA to see if he can be changed to triumeq  3) malignant htn - Please see d/c antihypertensive regimen listed below.  4) homelessness - d/c team and Education officer, museum. Pt will  have a Education officer, museum for assistance on d/c  5) DM - Discharge on metformin - Pt can f/u with pcp to decide whether or not to change his regimen. - Pt to continue diabetic diet.  Procedures:  Echo: Ef of 20-30%  Consultations:  CArdiology  ID  Discharge Exam: Filed Vitals:   12/10/13 1009  BP: 140/94  Pulse: 90  Temp: 98.2 F (36.8 C)  Resp:     General: Pt in nad, alert and awake Cardiovascular: rrr, no mrg Respiratory: cta bl, no wheezes  Discharge Instructions You were cared for by a hospitalist during your hospital stay. If you have any questions about your discharge medications or the care you received while you were in the hospital after you are discharged, you can call the unit and asked to speak with the hospitalist on call if the hospitalist that took care of you is not available. Once you are discharged, your primary care physician will handle any further medical issues. Please note that NO REFILLS for any discharge medications will be authorized once you are discharged, as it is imperative that you return to your primary care physician (or establish a relationship with a primary care physician if you do not have one) for your aftercare needs so that they can reassess your need for medications and monitor your lab values.      Discharge Instructions   Call MD for:  difficulty breathing, headache or visual disturbances    Complete by:  As directed      Call MD for:  persistant nausea and vomiting    Complete by:  As directed  Call MD for:  temperature >100.4    Complete by:  As directed      Diet - low sodium heart healthy    Complete by:  As directed      Discharge instructions    Complete by:  As directed   Please be sure to schedule appointment to follow up with your cardiologist and you infectious disease doctor.     Increase activity slowly    Complete by:  As directed             Medication List    STOP taking these medications       aspirin  325 MG tablet      TAKE these medications       carvedilol 6.25 MG tablet  Commonly known as:  COREG  Take 1 tablet (6.25 mg total) by mouth 2 (two) times daily with a meal.     dolutegravir 50 MG tablet  Commonly known as:  TIVICAY  Take 1 tablet (50 mg total) by mouth daily.     emtricitabine-tenofovir 200-300 MG per tablet  Commonly known as:  TRUVADA  Take 1 tablet by mouth daily.     hydrALAZINE 25 MG tablet  Commonly known as:  APRESOLINE  Take 1 tablet (25 mg total) by mouth every 8 (eight) hours.     isosorbide mononitrate 30 MG 24 hr tablet  Commonly known as:  IMDUR  Take 1 tablet (30 mg total) by mouth daily.     lisinopril 10 MG tablet  Commonly known as:  PRINIVIL,ZESTRIL  Take 1 tablet (10 mg total) by mouth daily.     metFORMIN 500 MG tablet  Commonly known as:  GLUCOPHAGE  Take 1 tablet (500 mg total) by mouth 2 (two) times daily with a meal.       No Known Allergies Follow-up Information   Follow up with Rande Brunt, NP On 12/17/2013. (At 1:45pm -- in the Advanced [facility] Failure Clinic--Please bring all medications code [id number])    Specialty:  Nurse Practitioner   Contact information:   [address] [place] [address] [phone]       Follow up with [facility] . (Patient to call contact [name] to request initial assessment appointment; [facility] for housing and Medication Assistance)    Contact information:   [address]  [place], [place]  [address] [phone]      Please follow up. Gers[facility] M[facility] MATCH Letter to cover 2 specialty meds at the [facility] )    Contact information:   [place]  [address] [place], [place]       Follow up with [facility]    . (Patient to set up today - Walk in appointment to establish Primary Care appointment and medication asssitance program )    Contact information:   [place]  [address] [phone]       The results of significant diagnostics from this hospitalization (including imaging, microbiology, ancillary and laboratory) are listed below for reference.    Significant Diagnostic Studies: Dg Chest 2 View  12/06/2013   CLINICAL DATA:  Cough and chest pain.  EXAM: CHEST  2 VIEW  COMPARISON:  None.  FINDINGS: Mediastinum and hilar structures are normal. Cardiomegaly. Pulmonary vascularity is normal. Mild right base infiltrate cannot be excluded. No pleural effusion or pneumothorax.  IMPRESSION: 1. Mild right base infiltrate cannot be excluded. 2. Cardiomegaly,  no evidence of congestive heart failure.   Electronically Signed   By: [place]   On: 12/06/2013 10:42   Ct Angio Chest Pe W/cm &/or Wo Cm  12/07/2013   CLINICAL DATA:  Cough.  Hypoxia.  EXAM: CT ANGIOGRAPHY CHEST WITH CONTRAST  TECHNIQUE: Multidetector CT imaging of the chest was performed using the standard protocol during bolus administration of intravenous contrast. Multiplanar CT image reconstructions and MIPs were obtained to evaluate the vascular anatomy.  CONTRAST:  21mL OMNIPAQUE IOHEXOL 350 MG/ML SOLN  COMPARISON:  Chest x-ray 12/28/2013.  FINDINGS: Pulmonary arteries are normal. No pulmonary embolus. Thoracic aorta normal caliber. Severe cardiomegaly. Mild pericardial thickening. Tiny pericardial effusion cannot be excluded. Coronary artery disease.  Shotty mediastinal lymph nodes. Small sliding hiatal hernia. Small amount of fluid noted in the distal thoracic esophagus. Gastroesophageal reflux may be present.  Large airways are patent. No focal pulmonary alveolar infiltrates are noted. No pleural effusion or pneumothorax. Multiple tiny subcentimeter nodules are noted throughout both lungs, the majority of which are calcified. These findings are consistent with prior granulomas disease.  Visualized upper abdominal viscera unremarkable.  Thyroid normal size. No significant axillary adenopathy. No  acute bony abnormality.  Review of the MIP images confirms the above findings.  IMPRESSION: 1. No evidence of pulmonary embolus. 2. Severe cardiomegaly. Tiny pericardial effusion cannot be excluded. Coronary artery disease. 3. Evidence of prior granulomatous disease. 4. Tiny sliding hiatal hernia. Fluid in the distal thoracic esophagus. Gastroesophageal reflux could present this fashion.   Electronically Signed   By: Marcello Moores  Register   On: 12/07/2013 14:03    Microbiology: Recent Results (from the past 240 hour(s))  CULTURE, BLOOD (ROUTINE X 2)     Status: None   Collection Time    12/06/13  9:20 AM      Result Value Ref Range Status   Specimen Description BLOOD RIGHT ARM   Final   Special Requests BOTTLES DRAWN AEROBIC AND ANAEROBIC 10ML   Final   Culture  Setup Time     Final   Value: 12/06/2013 18:14     Performed at [facility]   Culture     Final   Value:        BLOOD CULTURE RECEIVED NO GROWTH TO DATE CULTURE WILL BE HELD FOR 5 DAYS BEFORE ISSUING A FINAL NEGATIVE REPORT     Performed at [facility]   Report Status PENDING   Incomplete  CULTURE, BLOOD (ROUTINE X 2)     Status: None   Collection Time    12/06/13  9:30 AM      Result Value Ref Range Status   Specimen Description BLOOD LEFT HAND   Final   Special Requests BOTTLES DRAWN AEROBIC AND ANAEROBIC 5CC   Final   Culture  Setup Time     Final   Value: 12/06/2013 18:14     Performed at [facility]   Culture     Final   Value:        BLOOD CULTURE RECEIVED NO GROWTH TO DATE CULTURE WILL BE HELD FOR 5 DAYS BEFORE ISSUING A FINAL NEGATIVE REPORT     Performed at [facility]   Report Status PENDING   Incomplete  URINE CULTURE     Status: None   Collection Time    12/06/13 12:58 PM      Result Value Ref Range Status   Specimen Description URINE, RANDOM   Final   Special Requests NONE  Final   Culture  Setup Time     Final   Value: 12/06/2013 22:03     Performed at [facility]     Final   Value: 1,000 COLONIES/ML     Performed at [facility]   Culture     Final   Value: INSIGNIFICANT GROWTH     Performed at [facility]   Report Status 12/07/2013 FINAL   Final  CULTURE, EXPECTORATED SPUTUM-ASSESSMENT     Status: None   Collection Time    12/06/13 10:35 PM      Result Value Ref Range Status   Specimen Description SPUTUM   Final   Special Requests NONE   Final   Sputum evaluation     Final   Value: MICROSCOPIC FINDINGS SUGGEST THAT THIS SPECIMEN IS NOT REPRESENTATIVE OF LOWER RESPIRATORY SECRETIONS. PLEASE RECOLLECT.     Results Called toMack Hook [id number] [place]   Report Status 12/06/2013 FINAL   Final     Labs: Basic Metabolic Panel:  Recent Labs Lab 12/06/13 0920 12/06/13 1405 12/07/13 0445 12/08/13 0713 12/09/13 0409 12/10/13 0729  NA 136*  --  127* 130* 132* 134*  K 4.3  --  4.4 4.8 5.0 4.8  CL 96  --  90* 95* 96 98  CO2 26  --  $R'22 23 25 27  'Dy$ GLUCOSE 202*  --  266* 214* 229* 233*  BUN 7  --  $R'9 14 19 19  'LG$ CREATININE 0.82  --  0.86 1.11 1.07 1.08  CALCIUM 8.7  --  8.8 8.9 9.3 9.4  MG  --  1.3* 1.9  --   --   --    Liver Function Tests:  Recent Labs Lab 12/06/13 0920 12/07/13 0445  AST 48* 35  ALT 47 35  ALKPHOS 82 78  BILITOT 0.5 0.4  PROT 7.9 7.9  ALBUMIN 3.3* 2.9*   No results found for this basename: LIPASE, AMYLASE,  in the last 168 hours No results found for this basename: AMMONIA,  in the last 168 hours CBC:  Recent Labs Lab 12/06/13 0920 12/07/13 0445 12/09/13 0409  WBC 5.7 5.8 6.8  NEUTROABS 4.4 4.7  --   HGB 13.5 13.6 13.5  HCT 38.7* 38.9* 38.8*  MCV 94.6 94.2 94.4  PLT 134* 131* 122*   Cardiac Enzymes:  Recent Labs Lab 12/06/13 1615 12/06/13 2200 12/07/13 0414  TROPONINI <0.30 <0.30 <0.30   BNP: BNP (last 3 results)  Recent Labs  12/06/13 0920  PROBNP 1289.0*   CBG:  Recent Labs Lab 12/09/13 1057 12/09/13 1647 12/09/13 2123 12/10/13 0658  12/10/13 1233  GLUCAP 235* 318* 288* 210* 198*       Signed:  Velvet Bathe  [facility] 12/10/2013, 2:02 PM

## 2013-12-10 NOTE — Progress Notes (Addendum)
Advanced Heart Failure Rounding Note  Referring Physician: Dr. Jomarie Longs  Primary Physician: N/A  Primary Cardiologist: N/A  Reason for Consultation: A/C HF  Subjective:    Shaun Brennan is a 38 yo male with a history of HIV, DM2, HTN and chronic systolic heart failure. He is homeless and reports just moving here from [place]. Of note he has been out of his medications for about 3 months and he has never been treated for HIV.  Prior to admission reports that he he has had SOB, productive clear cough and LE edema that is worse than normal for about 1 month. Pertinent labs on admission were pro-BNP 1289, creatinine 0.82, Hgb 13.5, negative Troponin, >1000 on urinalysis with >300 protein, CD4 270, HIV 1 RNA Quant 10387, Mag 1.3, Hgb A1c 9.3. Hypoxic on admission with sats in the 80s. SBP 210/110   Negative Hep B surface antigen, HCV AB and RPR non-reactive.   ECHO: EF 15-20%, grade II DD, mild/mod LA, RV normal  Stable overnight. SBP 130-140s. Denies SOB, orthopnea or SOB. Weight stable. 24 hr I/O -1.9 liters.   Objective:   Weight Range:  Vital Signs:   Temp:  [97.8 F (36.6 C)-98.3 F (36.8 C)] 98.2 F (36.8 C) (09/02 1009) Pulse Rate:  [88-91] 90 (09/02 1009) Resp:  [18-20] 18 (09/02 0715) BP: (130-149)/(76-95) 140/94 mmHg (09/02 1009) SpO2:  [98 %-100 %] 100 % (09/02 1009) Weight:  [197 lb 1.6 oz (89.404 kg)] 197 lb 1.6 oz (89.404 kg) (09/02 0715) Last BM Date: 12/09/13  Weight change: Filed Weights   12/08/13 0450 12/09/13 0545 12/10/13 0715  Weight: 200 lb 11.2 oz (91.037 kg) 198 lb 10.2 oz (90.1 kg) 197 lb 1.6 oz (89.404 kg)    Intake/Output:   Intake/Output Summary (Last 24 hours) at 12/10/13 1119 Last data filed at 12/10/13 1000  Gross per 24 hour  Intake    720 ml  Output   2950 ml  Net  -2230 ml     Physical Exam: General: Well appearing. No resp difficulty, sitting on side of bed  HEENT: normal  Neck: supple. JVP 7. Carotids 2+ bilat; no bruits. No  lymphadenopathy or thryomegaly appreciated.  Cor: PMI nondisplaced. Regular rate & rhythm. No rubs, gallops or murmurs.  Lungs: clear  Abdomen: soft, nontender, nondistended. No hepatosplenomegaly. No bruits or masses. Good bowel sounds.  Extremities: no cyanosis, clubbing, rash, edema  Neuro: alert & orientedx3, cranial nerves grossly intact. moves all 4 extremities w/o difficulty. Affect pleasant  Labs: Basic Metabolic Panel:  Recent Labs Lab 12/06/13 0920 12/06/13 1405 12/07/13 0445 12/08/13 0713 12/09/13 0409 12/10/13 0729  NA 136*  --  127* 130* 132* 134*  K 4.3  --  4.4 4.8 5.0 4.8  CL 96  --  90* 95* 96 98  CO2 26  --  22 23 25 27   GLUCOSE 202*  --  266* 214* 229* 233*  BUN 7  --  9 14 19 19   CREATININE 0.82  --  0.86 1.11 1.07 1.08  CALCIUM 8.7  --  8.8 8.9 9.3 9.4  MG  --  1.3* 1.9  --   --   --     Liver Function Tests:  Recent Labs Lab 12/06/13 0920 12/07/13 0445  AST 48* 35  ALT 47 35  ALKPHOS 82 78  BILITOT 0.5 0.4  PROT 7.9 7.9  ALBUMIN 3.3* 2.9*   No results found for this basename: LIPASE, AMYLASE,  in the last 168 hours No  results found for this basename: AMMONIA,  in the last 168 hours  CBC:  Recent Labs Lab 12/06/13 0920 12/07/13 0445 12/09/13 0409  WBC 5.7 5.8 6.8  NEUTROABS 4.4 4.7  --   HGB 13.5 13.6 13.5  HCT 38.7* 38.9* 38.8*  MCV 94.6 94.2 94.4  PLT 134* 131* 122*    Cardiac Enzymes:  Recent Labs Lab 12/06/13 1615 12/06/13 2200 12/07/13 0414  TROPONINI <0.30 <0.30 <0.30    BNP: BNP (last 3 results)  Recent Labs  12/06/13 0920  PROBNP 1289.0*     Other results:  EKG:   Imaging:  No results found.   Medications:     Scheduled Medications: . antiseptic oral rinse  7 mL Mouth Rinse BID  . carvedilol  6.25 mg Oral BID WC  . dolutegravir  50 mg Oral Daily  . emtricitabine-tenofovir  1 tablet Oral Daily  . enoxaparin (LOVENOX) injection  40 mg Subcutaneous Q24H  . famotidine  20 mg Oral Daily  .  guaiFENesin  1,200 mg Oral BID  . hydrALAZINE  25 mg Oral 3 times per day  . insulin aspart  0-15 Units Subcutaneous TID WC  . isosorbide mononitrate  30 mg Oral Daily  . lisinopril  10 mg Oral Daily     Infusions:     PRN Medications:  acetaminophen, gi cocktail, hydrALAZINE, ipratropium-albuterol, ondansetron   Assessment:   1) A/C Systolic HF  - EF 20-25%  2) HTN  3) HIV  - CD4 count 270, never been treated  4) DM2  5) Social issues, homeless  6) Hypoxia   Plan/Discussion:    Shaun Brennan is a very pleasant 38 yo male with a history of chronic systolic HF (never had a cath), HTN, HIV and DM2.  Presented in A/C HF. SBP remains 130-140s. Will increase coreg to 9.375 mg BID. Continue hydralazine 25 mg TID, Imdur 30 mg daily and lisinopril 10 mg daly. Will get medications for HF from HF fund. Will send home with lasix 40 mg on Mondays and Fridays.   Will set up for CSW on OP side to help with finding PCP and Medicaid.   Home today with follow up in HF clinic next week.   Length of Stay: 4 [name] NP-C 12/10/2013, 11:19 AM  Advanced Heart Failure Team Pager [phone] (M-F; 7a - 4p)  Please contact [facility] for night-coverage after hours (4p -7a ) and weekends on [email or web address]  Patient seen and examined with Ulla Potash, NP. We discussed all aspects of the encounter. I agree with the assessment and plan as stated above.   He is much improved. Agree with above medical regimen. Will attempt to follow closely in HF Clinic. Extensive amount of time spent on medication education.   Sharif Rendell,MD 11:35 AM

## 2013-12-10 NOTE — Progress Notes (Signed)
INFECTIOUS DISEASE PROGRESS NOTE  ID: Shaun Brennan is a 38 y.o. male with  Principal Problem:   Acute respiratory distress Active Problems:   CAP (community acquired pneumonia)   Malignant hypertension   DM2 (diabetes mellitus, type 2)   HIV (human immunodeficiency virus infection)   H/O CHF   Cardiomegaly: per cxr 12/06/13   Acute on chronic systolic heart failure  Subjective: Without complaints  Abtx:  Anti-infectives   Start     Dose/Rate Brennan Frequency Ordered Stop   12/10/13 0000  dolutegravir (TIVICAY) 50 MG tablet     50 mg Oral Daily 12/10/13 0941     12/10/13 0000  emtricitabine-tenofovir (TRUVADA) 200-300 MG per tablet     1 tablet Oral Daily 12/10/13 0941     12/07/13 2100  dolutegravir (TIVICAY) tablet 50 mg     50 mg Oral Daily 12/07/13 2008     12/07/13 2100  emtricitabine-tenofovir (TRUVADA) 200-300 MG per tablet 1 tablet     1 tablet Oral Daily 12/07/13 2008     12/07/13 1300  azithromycin (ZITHROMAX) 500 mg in dextrose 5 % 250 mL IVPB  Status:  Discontinued     500 mg 250 mL/hr over 60 Minutes Intravenous Every 24 hours 12/06/13 1439 12/07/13 1738   12/07/13 1200  cefTRIAXone (ROCEPHIN) 1 g in dextrose 5 % 50 mL IVPB  Status:  Discontinued     1 g 100 mL/hr over 30 Minutes Intravenous Every 24 hours 12/06/13 1439 12/07/13 1738   12/06/13 1600  sulfamethoxazole-trimethoprim (BACTRIM) 480 mg of trimethoprim in dextrose 5 % 500 mL IVPB  Status:  Discontinued     480 mg of trimethoprim 353.3 mL/hr over 90 Minutes Intravenous Every 8 hours 12/06/13 1439 12/07/13 1738   12/06/13 1115  azithromycin (ZITHROMAX) 500 mg in dextrose 5 % 250 mL IVPB     500 mg 250 mL/hr over 60 Minutes Intravenous  Once 12/06/13 1105 12/06/13 1411   12/06/13 1115  cefTRIAXone (ROCEPHIN) 1 g in dextrose 5 % 50 mL IVPB     1 g 100 mL/hr over 30 Minutes Intravenous  Once 12/06/13 1105 12/06/13 1257      Medications:  Scheduled: . antiseptic oral rinse  7 mL Mouth Rinse BID  .  carvedilol  9.375 mg Oral BID WC  . dolutegravir  50 mg Oral Daily  . emtricitabine-tenofovir  1 tablet Oral Daily  . enoxaparin (LOVENOX) injection  40 mg Subcutaneous Q24H  . famotidine  20 mg Oral Daily  . guaiFENesin  1,200 mg Oral BID  . hydrALAZINE  25 mg Oral 3 times per day  . insulin aspart  0-15 Units Subcutaneous TID WC  . isosorbide mononitrate  30 mg Oral Daily  . lisinopril  10 mg Oral Daily    Objective: Vital signs in last 24 hours: Temp:  [97.8 F (36.6 C)-98.3 F (36.8 C)] 98.2 F (36.8 C) (09/02 1009) Pulse Rate:  [88-91] 90 (09/02 1009) Resp:  [18-20] 18 (09/02 0715) BP: (130-149)/(76-95) 140/94 mmHg (09/02 1009) SpO2:  [98 %-100 %] 100 % (09/02 1009) Weight:  [89.404 kg (197 lb 1.6 oz)] 89.404 kg (197 lb 1.6 oz) (09/02 0715)   General appearance: alert, cooperative and no distress  Lab Results  Recent Labs  12/09/13 0409 12/10/13 0729  WBC 6.8  --   HGB 13.5  --   HCT 38.8*  --   NA 132* 134*  K 5.0 4.8  CL 96 98  CO2 25 27  BUN 19 19  CREATININE 1.07 1.08   Liver Panel No results found for this basename: PROT, ALBUMIN, AST, ALT, ALKPHOS, BILITOT, BILIDIR, IBILI,  in the last 72 hours Sedimentation Rate No results found for this basename: ESRSEDRATE,  in the last 72 hours C-Reactive Protein No results found for this basename: CRP,  in the last 72 hours  Microbiology: Recent Results (from the past 240 hour(s))  CULTURE, BLOOD (ROUTINE X 2)     Status: None   Collection Time    12/06/13  9:20 AM      Result Value Ref Range Status   Specimen Description BLOOD RIGHT ARM   Final   Special Requests BOTTLES DRAWN AEROBIC AND ANAEROBIC 10ML   Final   Culture  Setup Time     Final   Value: 12/06/2013 18:14     Performed at [facility]   Culture     Final   Value:        BLOOD CULTURE RECEIVED NO GROWTH TO DATE CULTURE WILL BE HELD FOR 5 DAYS BEFORE ISSUING A FINAL NEGATIVE REPORT     Performed at [facility]   Report Status  PENDING   Incomplete  CULTURE, BLOOD (ROUTINE X 2)     Status: None   Collection Time    12/06/13  9:30 AM      Result Value Ref Range Status   Specimen Description BLOOD LEFT HAND   Final   Special Requests BOTTLES DRAWN AEROBIC AND ANAEROBIC 5CC   Final   Culture  Setup Time     Final   Value: 12/06/2013 18:14     Performed at [facility]   Culture     Final   Value:        BLOOD CULTURE RECEIVED NO GROWTH TO DATE CULTURE WILL BE HELD FOR 5 DAYS BEFORE ISSUING A FINAL NEGATIVE REPORT     Performed at [facility]   Report Status PENDING   Incomplete  URINE CULTURE     Status: None   Collection Time    12/06/13 12:58 PM      Result Value Ref Range Status   Specimen Description URINE, RANDOM   Final   Special Requests NONE   Final   Culture  Setup Time     Final   Value: 12/06/2013 22:03     Performed at [place]     Final   Value: 1,000 COLONIES/ML     Performed at [facility]   Culture     Final   Value: INSIGNIFICANT GROWTH     Performed at [facility]   Report Status 12/07/2013 FINAL   Final  CULTURE, EXPECTORATED SPUTUM-ASSESSMENT     Status: None   Collection Time    12/06/13 10:35 PM      Result Value Ref Range Status   Specimen Description SPUTUM   Final   Special Requests NONE   Final   Sputum evaluation     Final   Value: MICROSCOPIC FINDINGS SUGGEST THAT THIS SPECIMEN IS NOT REPRESENTATIVE OF LOWER RESPIRATORY SECRETIONS. PLEASE RECOLLECT.     Results Called toMack Hook [id number] [address] [facility]   Report Status 12/06/2013 FINAL   Final    Studies/Results: No results found.   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  Will get his first 30 days of ART at [facility]  pharmacy.  He is given ID clinic appt with me on 01-13-14 at 2pm.  HLA pending         Bobby Rumpf Infectious Diseases (pager) [phone] [email or web address] 12/10/2013, 2:16  PM  LOS: 4 days

## 2013-12-12 ENCOUNTER — Encounter: Payer: Self-pay | Admitting: Internal Medicine

## 2013-12-12 ENCOUNTER — Ambulatory Visit: Payer: Self-pay | Attending: Internal Medicine | Admitting: Internal Medicine

## 2013-12-12 VITALS — BP 117/82 | HR 87 | Temp 98.7°F | Resp 16 | Ht 65.0 in | Wt 198.0 lb

## 2013-12-12 DIAGNOSIS — I10 Essential (primary) hypertension: Secondary | ICD-10-CM | POA: Insufficient documentation

## 2013-12-12 DIAGNOSIS — IMO0001 Reserved for inherently not codable concepts without codable children: Secondary | ICD-10-CM | POA: Insufficient documentation

## 2013-12-12 DIAGNOSIS — J189 Pneumonia, unspecified organism: Secondary | ICD-10-CM

## 2013-12-12 DIAGNOSIS — E139 Other specified diabetes mellitus without complications: Secondary | ICD-10-CM

## 2013-12-12 DIAGNOSIS — F17200 Nicotine dependence, unspecified, uncomplicated: Secondary | ICD-10-CM

## 2013-12-12 DIAGNOSIS — E089 Diabetes mellitus due to underlying condition without complications: Secondary | ICD-10-CM | POA: Insufficient documentation

## 2013-12-12 DIAGNOSIS — I509 Heart failure, unspecified: Secondary | ICD-10-CM | POA: Insufficient documentation

## 2013-12-12 DIAGNOSIS — Z139 Encounter for screening, unspecified: Secondary | ICD-10-CM

## 2013-12-12 DIAGNOSIS — Z21 Asymptomatic human immunodeficiency virus [HIV] infection status: Secondary | ICD-10-CM

## 2013-12-12 DIAGNOSIS — Z79899 Other long term (current) drug therapy: Secondary | ICD-10-CM | POA: Insufficient documentation

## 2013-12-12 DIAGNOSIS — I5023 Acute on chronic systolic (congestive) heart failure: Secondary | ICD-10-CM

## 2013-12-12 DIAGNOSIS — B20 Human immunodeficiency virus [HIV] disease: Secondary | ICD-10-CM | POA: Insufficient documentation

## 2013-12-12 DIAGNOSIS — E119 Type 2 diabetes mellitus without complications: Secondary | ICD-10-CM | POA: Insufficient documentation

## 2013-12-12 DIAGNOSIS — Z8679 Personal history of other diseases of the circulatory system: Secondary | ICD-10-CM

## 2013-12-12 DIAGNOSIS — Z23 Encounter for immunization: Secondary | ICD-10-CM

## 2013-12-12 LAB — CULTURE, BLOOD (ROUTINE X 2)
Culture: NO GROWTH
Culture: NO GROWTH

## 2013-12-12 LAB — HLA B*5701: HLA B 5701: NEGATIVE

## 2013-12-12 MED ORDER — FREESTYLE SYSTEM KIT: 1.0000 | PACK | Freq: Three times a day (TID) | Status: DC

## 2013-12-12 NOTE — Progress Notes (Signed)
Pt here per HFU- Chronic systolic failure Pt is compliant with taking medications daily Requesting flu vaccine Denies sob or swelling,chest pain

## 2013-12-12 NOTE — Patient Instructions (Signed)
DASH Eating Plan °DASH stands for "Dietary Approaches to Stop Hypertension." The DASH eating plan is a healthy eating plan that has been shown to reduce high blood pressure (hypertension). Additional health benefits may include reducing the risk of type 2 diabetes mellitus, heart disease, and stroke. The DASH eating plan may also help with weight loss. °WHAT DO I NEED TO KNOW ABOUT THE DASH EATING PLAN? °For the DASH eating plan, you will follow these general guidelines: °· Choose foods with a percent daily value for sodium of less than 5% (as listed on the food label). °· Use salt-free seasonings or herbs instead of table salt or sea salt. °· Check with your health care provider or pharmacist before using salt substitutes. °· Eat lower-sodium products, often labeled as "lower sodium" or "no salt added." °· Eat fresh foods. °· Eat more vegetables, fruits, and low-fat dairy products. °· Choose whole grains. Look for the word "whole" as the first word in the ingredient list. °· Choose fish and skinless chicken or turkey more often than red meat. Limit fish, poultry, and meat to 6 oz (170 g) each day. °· Limit sweets, desserts, sugars, and sugary drinks. °· Choose heart-healthy fats. °· Limit cheese to 1 oz (28 g) per day. °· Eat more home-cooked food and less restaurant, buffet, and fast food. °· Limit fried foods. °· Cook foods using methods other than frying. °· Limit canned vegetables. If you do use them, rinse them well to decrease the sodium. °· When eating at a restaurant, ask that your food be prepared with less salt, or no salt if possible. °WHAT FOODS CAN I EAT? °Seek help from a dietitian for individual calorie needs. °Grains °Whole grain or whole wheat bread. Brown rice. Whole grain or whole wheat pasta. Quinoa, bulgur, and whole grain cereals. Low-sodium cereals. Corn or whole wheat flour tortillas. Whole grain cornbread. Whole grain crackers. Low-sodium crackers. °Vegetables °Fresh or frozen vegetables  (raw, steamed, roasted, or grilled). Low-sodium or reduced-sodium tomato and vegetable juices. Low-sodium or reduced-sodium tomato sauce and paste. Low-sodium or reduced-sodium canned vegetables.  °Fruits °All fresh, canned (in natural juice), or frozen fruits. °Meat and Other Protein Products °Ground beef (85% or leaner), grass-fed beef, or beef trimmed of fat. Skinless chicken or turkey. Ground chicken or turkey. Pork trimmed of fat. All fish and seafood. Eggs. Dried beans, peas, or lentils. Unsalted nuts and seeds. Unsalted canned beans. °Dairy °Low-fat dairy products, such as skim or 1% milk, 2% or reduced-fat cheeses, low-fat ricotta or cottage cheese, or plain low-fat yogurt. Low-sodium or reduced-sodium cheeses. °Fats and Oils °Tub margarines without trans fats. Light or reduced-fat mayonnaise and salad dressings (reduced sodium). Avocado. Safflower, olive, or canola oils. Natural peanut or almond butter. °Other °Unsalted popcorn and pretzels. °The items listed above may not be a complete list of recommended foods or beverages. Contact your dietitian for more options. °WHAT FOODS ARE NOT RECOMMENDED? °Grains °White bread. White pasta. White rice. Refined cornbread. Bagels and croissants. Crackers that contain trans fat. °Vegetables °Creamed or fried vegetables. Vegetables in a cheese sauce. Regular canned vegetables. Regular canned tomato sauce and paste. Regular tomato and vegetable juices. °Fruits °Dried fruits. Canned fruit in light or heavy syrup. Fruit juice. °Meat and Other Protein Products °Fatty cuts of meat. Ribs, chicken wings, bacon, sausage, bologna, salami, chitterlings, fatback, hot dogs, bratwurst, and packaged luncheon meats. Salted nuts and seeds. Canned beans with salt. °Dairy °Whole or 2% milk, cream, half-and-half, and cream cheese. Whole-fat or sweetened yogurt. Full-fat   cheeses or blue cheese. Nondairy creamers and whipped toppings. Processed cheese, cheese spreads, or cheese  curds. °Condiments °Onion and garlic salt, seasoned salt, table salt, and sea salt. Canned and packaged gravies. Worcestershire sauce. Tartar sauce. Barbecue sauce. Teriyaki sauce. Soy sauce, including reduced sodium. Steak sauce. Fish sauce. Oyster sauce. Cocktail sauce. Horseradish. Ketchup and mustard. Meat flavorings and tenderizers. Bouillon cubes. Hot sauce. Tabasco sauce. Marinades. Taco seasonings. Relishes. °Fats and Oils °Butter, stick margarine, lard, shortening, ghee, and bacon fat. Coconut, palm kernel, or palm oils. Regular salad dressings. °Other °Pickles and olives. Salted popcorn and pretzels. °The items listed above may not be a complete list of foods and beverages to avoid. Contact your dietitian for more information. °WHERE CAN I FIND MORE INFORMATION? °National Heart, Lung, and Blood Institute: www.nhlbi.nih.gov/health/health-topics/topics/dash/ °Document Released: 03/16/2011 Document Revised: 08/11/2013 Document Reviewed: 01/29/2013 °ExitCare® Patient Information ©2015 [employer]. This information is not intended to replace advice given to you by your health care provider. Make sure you discuss any questions you have with your health care provider. ° °

## 2013-12-12 NOTE — Progress Notes (Signed)
Patient Demographics  Shaun Brennan, is a 38 y.o. male  QQP:[id number]  ZTI:[id number]  DOB - [birth year]  CC:  Chief Complaint  Patient presents with  . Hospitalization Follow-up  . Congestive Heart Failure  . Pneumonia       HPI: Shaun Brennan is a 38 y.o. male here today to establish medical care.History of HIV for the last 10 years was not on any medications history of diabetes hypertension history of CHF, patient recently hospitalized her with symptoms of productive cough shortness of breath chest and, EMR reviewed her patient was diagnosed with acute on chronic CHF, cardiology on board, his medications were optimized was started on Coreg lisinopril hydralazine and nitrates, for HIV he was and started on antiretroviral medications, history of diabetes as per patient he used to be on oral hypoglycemics and  patient was discharged on metformin, he had echocardiogram done which reported 20-30% EF, patient symptomatically improved and was advised to follow with cardiology heart failure clinic as well as infectious disease. Currently patient denies any acute symptoms. Patient has been compliant in taking his medications his blood sugar is slightly elevated as per patient he is already eaten today. Patient has No headache, No chest pain, No abdominal pain - No Nausea, No new weakness tingling or numbness, No Cough - SOB.  No Known Allergies Past Medical History  Diagnosis Date  . HIV (human immunodeficiency virus infection)   . Diabetes mellitus without complication   . Hypertension   . Chronic systolic heart failure   . DM2 (diabetes mellitus, type 2)     a. EF 15-20%, grade II DD, LA mild/mod dilated   Current Outpatient Prescriptions on File Prior to Visit  Medication Sig Dispense Refill  . carvedilol (COREG) 6.25 MG tablet Take 1 tablet (6.25 mg total) by mouth 2 (two) times daily with a meal.  60 tablet  0  . dolutegravir (TIVICAY) 50 MG tablet Take 1 tablet (50 mg total)  by mouth daily.  30 tablet  0  . emtricitabine-tenofovir (TRUVADA) 200-300 MG per tablet Take 1 tablet by mouth daily.  30 tablet  0  . hydrALAZINE (APRESOLINE) 25 MG tablet Take 1 tablet (25 mg total) by mouth every 8 (eight) hours.  90 tablet  0  . isosorbide mononitrate (IMDUR) 30 MG 24 hr tablet Take 1 tablet (30 mg total) by mouth daily.  30 tablet  0  . lisinopril (PRINIVIL,ZESTRIL) 10 MG tablet Take 1 tablet (10 mg total) by mouth daily.  30 tablet  0  . metFORMIN (GLUCOPHAGE) 500 MG tablet Take 1 tablet (500 mg total) by mouth 2 (two) times daily with a meal.  60 tablet  0   No current facility-administered medications on file prior to visit.   Family History  Problem Relation Age of Onset  . Other Father   . Diabetes Mother   . Stroke Paternal Grandfather    History   Social History  . Marital Status: Single    Spouse Name: N/A    Number of Children: N/A  . Years of Education: N/A   Occupational History  . Not on file.   Social History Main Topics  . Smoking status: Current Every Day Smoker -- 0.25 packs/day    Types: Cigarettes  . Smokeless tobacco: Not on file     Comment: pt admits to cutting down to 5 cigarettes/day  . Alcohol Use: Yes     Comment: socially about 12 pack a weekend  . Drug  Use: Yes    Special: Marijuana  . Sexual Activity: Yes   Other Topics Concern  . Not on file   Social History Narrative   Currently homeless    Review of Systems: Constitutional: Negative for fever, chills, diaphoresis, activity change, appetite change and fatigue. HENT: Negative for ear pain, nosebleeds, congestion, facial swelling, rhinorrhea, neck pain, neck stiffness and ear discharge.  Eyes: Negative for pain, discharge, redness, itching and visual disturbance. Respiratory: Negative for cough, choking, chest tightness, shortness of breath, wheezing and stridor.  Cardiovascular: Negative for chest pain, palpitations and leg swelling. Gastrointestinal: Negative for  abdominal distention. Genitourinary: Negative for dysuria, urgency, frequency, hematuria, flank pain, decreased urine volume, difficulty urinating and dyspareunia.  Musculoskeletal: Negative for back pain, joint swelling, arthralgia and gait problem. Neurological: Negative for dizziness, tremors, seizures, syncope, facial asymmetry, speech difficulty, weakness, light-headedness, numbness and headaches.  Hematological: Negative for adenopathy. Does not bruise/bleed easily. Psychiatric/Behavioral: Negative for hallucinations, behavioral problems, confusion, dysphoric mood, decreased concentration and agitation.    Objective:   Filed Vitals:   12/12/13 1003  BP: 117/82  Pulse: 87  Temp: 98.7 F (37.1 C)  Resp: 16    Physical Exam: Constitutional: Patient appears well-developed and well-nourished. No distress. HENT: Normocephalic, atraumatic, External right and left ear normal. Oropharynx is clear and moist.  Eyes: Conjunctivae and EOM are normal. PERRLA, no scleral icterus. Neck: Normal ROM. Neck supple. No JVD. No tracheal deviation. No thyromegaly. CVS: RRR, S1/S2 +, no murmurs, no gallops, no carotid bruit.  Pulmonary: Effort and breath sounds normal, no stridor, rhonchi, wheezes, rales.  Abdominal: Soft. BS +, no distension, tenderness, rebound or guarding.  Musculoskeletal: Normal range of motion. No edema and no tenderness. 1+ pedal edema.  Neuro: Alert. Normal reflexes, muscle tone coordination. No cranial nerve deficit. Skin: Skin is warm and dry. No rash noted. Not diaphoretic. No erythema. No pallor. Psychiatric: Normal mood and affect. Behavior, judgment, thought content normal.  Lab Results  Component Value Date   WBC 6.8 12/09/2013   HGB 13.5 12/09/2013   HCT 38.8* 12/09/2013   MCV 94.4 12/09/2013   PLT 122* 12/09/2013   Lab Results  Component Value Date   CREATININE 1.08 12/10/2013   BUN 19 12/10/2013   NA 134* 12/10/2013   K 4.8 12/10/2013   CL 98 12/10/2013   CO2 27 12/10/2013      Lab Results  Component Value Date   HGBA1C 9.7* 12/06/2013   Lipid Panel  No results found for this basename: chol, trig, hdl, cholhdl, vldl, ldlcalc       Assessment and plan:   1. HIV (human immunodeficiency virus infection) Currently patient is on antiretroviral medication and is scheduled to follow with ID.   3.H/O CHF/ Acute on chronic systolic heart failure Currently patient is on Coreg imdur ACE inhibitor Lasix and is scheduled to follow with cardiology  4. CAP (community acquired pneumonia) During hospitalization treated with IV antibiotics.  5. Diabetes mellitus due to underlying condition without complications Patient has recently been started on metformin, advise for diabetes planning, prescribed glucometer to check blood sugar at home and keep the fingerstick log, will repeat A1c in 3 months. - glucose monitoring kit (FREESTYLE) monitoring kit; 1 each by Does not apply route 4 (four) times daily - after meals and at bedtime. 1 month Diabetic Testing Supplies for QAC-QHS accuchecks.  Dispense: 1 each; Refill: 1 - Lipid panel; Future  6. Smoking Advised patient to quit smoking.  7. Screening Will check  baseline blood work. - TSH; Future - Vit D  25 hydroxy (rtn osteoporosis monitoring); Future   Return in about 3 months (around 03/13/2014) for diabetes, hypertension,CHF.  Lorayne Marek, MD

## 2013-12-13 LAB — HLA B*5701: HLA B 5701: NEGATIVE

## 2013-12-16 LAB — HIV-1 INTEGRASE GENOTYPE: Date Viral Load Collected: NO GROWTH

## 2013-12-16 NOTE — Progress Notes (Addendum)
Patient ID: Shaun Brennan, male   DOB: 08/07/1975, 38 y.o.   MRN: [id number] PCP: Dr. Annitta Needs [facility]) Primary Cardiologist: Dr. Haroldine Laws  HPI:  Shaun Brennan is a 38 yo male with a history of HIV, DM2, HTN, tobacco abuse and chronic systolic heart failure.   Negative Hep B surface antigen and negative HCV AB; RPR non-reactive.   ECHO (12/08/2013): EF 15-20%, grade II DD, mild/mod LA, RV normal  [facility] Follow up: Doing well since leaving hospital. Is currently living in a hotel [facility]) through [employer]. Has been taking coreg incorrectly was taking 3.125 mg BID instead of 9.375 mg BID. Denies SOB, CP, PND or orthopnea. Can't weigh currently d/t not having scale. Brought all medications to appointment. Can walk about 2-3 blocks before stopping.    ROS: All systems negative except as listed in HPI, PMH and Problem List.  SH:  History   Social History  . Marital Status: Single    Spouse Name: N/A    Number of Children: N/A  . Years of Education: N/A   Occupational History  . Not on file.   Social History Main Topics  . Smoking status: Current Every Day Smoker -- 0.25 packs/day    Types: Cigarettes  . Smokeless tobacco: Not on file     Comment: pt admits to cutting down to 5 cigarettes/day  . Alcohol Use: Yes     Comment: socially about 12 pack a weekend  . Drug Use: Yes    Special: Marijuana  . Sexual Activity: Yes   Other Topics Concern  . Not on file   Social History Narrative   Currently homeless    FH:  Family History  Problem Relation Age of Onset  . Other Father   . Diabetes Mother   . Stroke Paternal Grandfather     Past Medical History  Diagnosis Date  . HIV (human immunodeficiency virus infection)   . Diabetes mellitus without complication   . Hypertension   . Chronic systolic heart failure     a. EF 15-20%, grade II DD, LA mild/mod dilated  . DM2 (diabetes mellitus, type 2)     Current  Outpatient Prescriptions  Medication Sig Dispense Refill  . carvedilol (COREG) 6.25 MG tablet Take 3.125 mg by mouth 2 (two) times daily with a meal.      . dolutegravir (TIVICAY) 50 MG tablet Take 1 tablet (50 mg total) by mouth daily.  30 tablet  0  . emtricitabine-tenofovir (TRUVADA) 200-300 MG per tablet Take 1 tablet by mouth daily.  30 tablet  0  . furosemide (LASIX) 40 MG tablet Take 40 mg by mouth. Take only on Mondays and Fridays      . glucose monitoring kit (FREESTYLE) monitoring kit 1 each by Does not apply route 4 (four) times daily - after meals and at bedtime. 1 month Diabetic Testing Supplies for QAC-QHS accuchecks.  1 each  1  . hydrALAZINE (APRESOLINE) 25 MG tablet Take 1 tablet (25 mg total) by mouth every 8 (eight) hours.  90 tablet  0  . isosorbide mononitrate (IMDUR) 30 MG 24 hr tablet Take 1 tablet (30 mg total) by mouth daily.  30 tablet  0  . lisinopril (PRINIVIL,ZESTRIL) 10 MG tablet Take 1 tablet (10 mg total) by mouth daily.  30 tablet  0  . metFORMIN (GLUCOPHAGE) 500 MG tablet Take 1 tablet (500 mg total) by mouth 2 (two) times daily with a meal.  60  tablet  0   No current facility-administered medications for this encounter.    Filed Vitals:   12/17/13 1334  BP: 122/80  Pulse: 99  Weight: 200 lb 6.4 oz (90.901 kg)  SpO2: 98%    PHYSICAL EXAM:  General:  Well appearing. No resp difficulty HEENT: normal Neck: supple. JVP flat. Carotids 2+ bilaterally; no bruits. No lymphadenopathy or thryomegaly appreciated. Cor: PMI normal. Regular rate & rhythm. No rubs, gallops or murmurs. Lungs: clear Abdomen: soft, nontender, nondistended. No hepatosplenomegaly. No bruits or masses. Good bowel sounds. Extremities: no cyanosis, clubbing, rash, edema Neuro: alert & orientedx3, cranial nerves grossly intact. Moves all 4 extremities w/o difficulty. Affect pleasant.   ASSESSMENT & PLAN:  1) Chronic systolic HF: NICM?, EF 54-36%, grade II DD (11/2013) - Recently  admitted to the hospital for A/C HF. Patient currently is homeless and recently moved here from [place]. - NYHA II symptoms and volume status stable. Will continue lasix 40 mg on Mondays and Fridays. Will provide patient with scale and have him start tracking weight. If starts trending up may need to change lasix to daily. Check BMET today. - Will increase coreg to 6.25 mg BID.  - Continue lisinopril 10 mg daily.  - Continue hydralazine 25 mg TID and Imdur 30 mg daily. - Will hold off on Spironolactone currently until we get K+. - End of November/early December will need to repeat ECHO and if EF remains less than 35% will need EP referral for ICD. QRS narrow, no CRT-D  - Discussed the importance of the HF clinic and the need to weigh daily, follow a low sodium diet, drink less than 2L a day and follow up in the clinic.  2) HTN - Stable. As above will increase lisinopril and coreg. 3) HIV - Following with ID clinic 4) Social Issues - Met with our social worker today to try and help with resources. He is in the process of getting Medicaid. Has a CSW with ID who will contact him tomorrow and have asked her to contact our CSW so resources are not duplicated.  5) Tobacco abuse: - Reports smoking less but continues to smoke about 2-5 cigarettes a day. Encouraged to try and abstain completely.  6) Alcohol abuse: - Discussed trying to abstain completely. He has cut down and is now drinking only about 6 pack per week.   Follow up 2 weeks Shaun Brennan 1:53 PM

## 2013-12-17 ENCOUNTER — Ambulatory Visit: Payer: Self-pay | Attending: Internal Medicine

## 2013-12-17 ENCOUNTER — Encounter: Payer: Self-pay | Admitting: Licensed Clinical Social Worker

## 2013-12-17 ENCOUNTER — Encounter (HOSPITAL_COMMUNITY): Payer: Self-pay

## 2013-12-17 ENCOUNTER — Ambulatory Visit (HOSPITAL_COMMUNITY)
Admit: 2013-12-17 | Discharge: 2013-12-17 | Disposition: A | Discharge status: Home / Self Care | Payer: Self-pay | Source: Ambulatory Visit | Attending: Cardiology | Admitting: Cardiology

## 2013-12-17 VITALS — BP 122/80 | HR 99 | Wt 200.4 lb

## 2013-12-17 DIAGNOSIS — Z1329 Encounter for screening for other suspected endocrine disorder: Secondary | ICD-10-CM | POA: Insufficient documentation

## 2013-12-17 DIAGNOSIS — E089 Diabetes mellitus due to underlying condition without complications: Secondary | ICD-10-CM

## 2013-12-17 DIAGNOSIS — Z1389 Encounter for screening for other disorder: Secondary | ICD-10-CM | POA: Insufficient documentation

## 2013-12-17 DIAGNOSIS — E139 Other specified diabetes mellitus without complications: Secondary | ICD-10-CM | POA: Insufficient documentation

## 2013-12-17 DIAGNOSIS — I10 Essential (primary) hypertension: Secondary | ICD-10-CM | POA: Insufficient documentation

## 2013-12-17 DIAGNOSIS — F17200 Nicotine dependence, unspecified, uncomplicated: Secondary | ICD-10-CM | POA: Insufficient documentation

## 2013-12-17 DIAGNOSIS — Z79899 Other long term (current) drug therapy: Secondary | ICD-10-CM | POA: Insufficient documentation

## 2013-12-17 DIAGNOSIS — B20 Human immunodeficiency virus [HIV] disease: Secondary | ICD-10-CM | POA: Insufficient documentation

## 2013-12-17 DIAGNOSIS — Z139 Encounter for screening, unspecified: Secondary | ICD-10-CM

## 2013-12-17 DIAGNOSIS — R7989 Other specified abnormal findings of blood chemistry: Secondary | ICD-10-CM

## 2013-12-17 DIAGNOSIS — F1010 Alcohol abuse, uncomplicated: Secondary | ICD-10-CM | POA: Insufficient documentation

## 2013-12-17 DIAGNOSIS — Z21 Asymptomatic human immunodeficiency virus [HIV] infection status: Secondary | ICD-10-CM

## 2013-12-17 DIAGNOSIS — I5022 Chronic systolic (congestive) heart failure: Secondary | ICD-10-CM | POA: Insufficient documentation

## 2013-12-17 LAB — BASIC METABOLIC PANEL
ANION GAP: 14 (ref 5–15)
BUN: 12 mg/dL (ref 6–23)
CALCIUM: 9.5 mg/dL (ref 8.4–10.5)
CO2: 24 meq/L (ref 19–32)
Chloride: 96 mEq/L (ref 96–112)
Creatinine, Ser: 0.87 mg/dL (ref 0.50–1.35)
GFR calc non Af Amer: >90 mL/min (ref >90)
Glucose, Bld: 248 mg/dL — ABNORMAL HIGH (ref 70–99)
Potassium: 4.3 mEq/L (ref 3.7–5.3)
SODIUM: 134 meq/L — AB (ref 137–147)

## 2013-12-17 MED ORDER — CARVEDILOL 6.25 MG PO TABS: 6.2500 mg | ORAL_TABLET | Freq: Two times a day (BID) | ORAL | Status: DC

## 2013-12-17 NOTE — Progress Notes (Signed)
CSW referred to assist patient with resources as he recently moved here from [place] and is homeless. Patient is currently residing in a hotel [facility]) with his partner Broadus John. He reports that they met in July/August and moved here just prior to his hospitalization. Patient has no income and no insurance. He was referred to [facility] and has a Therapist, sports and SW visiting him and assisting with community referrals. He is unsure their names or numbers but reports they plan to visit 2x weekly. Patient reports he previously had a disability application but not sure if it was ever processed. He last worked in 2010 and reports "I couldn't get a job since". He reports that the SW will make a home visit tomorrow and will have her call this CSW to coordinate care needs. Patient was seen at [facility] and wellness for primary care but unsure if an application for the [facility] card was taken. CSW will follow up post phone contact with community social worker already involved to determine further intervention. Raquel Sarna, [place]

## 2013-12-17 NOTE — Patient Instructions (Signed)
Doing great.  Will increase your coreg to 6.25 mg (1 tablet) in the morning and 6.25 mg (1 tablet) in the evening.  Follow up in 2 weeks.  Do the following things EVERYDAY: 1) Weigh yourself in the morning before breakfast. Write it down and keep it in a log. 2) Take your medicines as prescribed 3) Eat low salt foods-Limit salt (sodium) to 2000 mg per day.  4) Stay as active as you can everyday 5) Limit all fluids for the day to less than 2 liters 6)

## 2013-12-18 ENCOUNTER — Telehealth: Payer: Self-pay | Admitting: Emergency Medicine

## 2013-12-18 LAB — LIPID PANEL
Cholesterol: 184 mg/dL (ref 0–200)
HDL: 58 mg/dL (ref >39)
LDL CALC: 113 mg/dL — AB (ref 0–99)
Total CHOL/HDL Ratio: 3.2 Ratio
Triglycerides: 65 mg/dL (ref <150)
VLDL: 13 mg/dL (ref 0–40)

## 2013-12-18 LAB — HIV-1 GENOTYPR PLUS

## 2013-12-18 LAB — VITAMIN D 25 HYDROXY (VIT D DEFICIENCY, FRACTURES): Vit D, 25-Hydroxy: 33 ng/mL (ref 30–89)

## 2013-12-18 LAB — TSH: TSH: 0.913 u[IU]/mL (ref 0.350–4.500)

## 2013-12-18 NOTE — Telephone Encounter (Signed)
Message copied by Darlis Loan on Thu Dec 18, 2013  2:30 PM ------      Message from: Doris Cheadle      Created: Thu Dec 18, 2013  9:43 AM       Call and let the Patient know that blood work is normal.       ------

## 2013-12-18 NOTE — Telephone Encounter (Signed)
Left message for pt to return call for lab results.

## 2013-12-20 ENCOUNTER — Emergency Department (HOSPITAL_COMMUNITY): Payer: Self-pay

## 2013-12-20 ENCOUNTER — Encounter (HOSPITAL_COMMUNITY): Payer: Self-pay | Admitting: Emergency Medicine

## 2013-12-20 ENCOUNTER — Emergency Department (HOSPITAL_COMMUNITY)
Admission: EM | Admit: 2013-12-20 | Discharge: 2013-12-23 | Disposition: A | Discharge status: Home / Self Care | Payer: Self-pay | Attending: Emergency Medicine | Admitting: Emergency Medicine

## 2013-12-20 DIAGNOSIS — Z79899 Other long term (current) drug therapy: Secondary | ICD-10-CM | POA: Insufficient documentation

## 2013-12-20 DIAGNOSIS — E119 Type 2 diabetes mellitus without complications: Secondary | ICD-10-CM | POA: Insufficient documentation

## 2013-12-20 DIAGNOSIS — Z21 Asymptomatic human immunodeficiency virus [HIV] infection status: Secondary | ICD-10-CM | POA: Insufficient documentation

## 2013-12-20 DIAGNOSIS — G479 Sleep disorder, unspecified: Secondary | ICD-10-CM | POA: Insufficient documentation

## 2013-12-20 DIAGNOSIS — Z5900 Homelessness unspecified: Secondary | ICD-10-CM | POA: Insufficient documentation

## 2013-12-20 DIAGNOSIS — F329 Major depressive disorder, single episode, unspecified: Secondary | ICD-10-CM | POA: Insufficient documentation

## 2013-12-20 DIAGNOSIS — I10 Essential (primary) hypertension: Secondary | ICD-10-CM | POA: Insufficient documentation

## 2013-12-20 DIAGNOSIS — I5022 Chronic systolic (congestive) heart failure: Secondary | ICD-10-CM | POA: Insufficient documentation

## 2013-12-20 DIAGNOSIS — F411 Generalized anxiety disorder: Secondary | ICD-10-CM | POA: Insufficient documentation

## 2013-12-20 DIAGNOSIS — F32A Depression, unspecified: Secondary | ICD-10-CM

## 2013-12-20 DIAGNOSIS — F17200 Nicotine dependence, unspecified, uncomplicated: Secondary | ICD-10-CM | POA: Insufficient documentation

## 2013-12-20 DIAGNOSIS — F3289 Other specified depressive episodes: Secondary | ICD-10-CM | POA: Insufficient documentation

## 2013-12-20 DIAGNOSIS — R45851 Suicidal ideations: Secondary | ICD-10-CM | POA: Insufficient documentation

## 2013-12-20 LAB — RAPID URINE DRUG SCREEN, HOSP PERFORMED
Amphetamines: NOT DETECTED
BENZODIAZEPINES: NOT DETECTED
Barbiturates: NOT DETECTED
COCAINE: NOT DETECTED
Opiates: NOT DETECTED
Tetrahydrocannabinol: POSITIVE — AB

## 2013-12-20 LAB — CBC WITH DIFFERENTIAL/PLATELET
BASOS ABS: 0 10*3/uL (ref 0.0–0.1)
Basophils Relative: 0 % (ref 0–1)
Eosinophils Absolute: 0.1 10*3/uL (ref 0.0–0.7)
Eosinophils Relative: 1 % (ref 0–5)
HCT: 40.4 % (ref 39.0–52.0)
HEMOGLOBIN: 14.2 g/dL (ref 13.0–17.0)
Lymphocytes Relative: 35 % (ref 12–46)
Lymphs Abs: 2.1 10*3/uL (ref 0.7–4.0)
MCH: 32.9 pg (ref 26.0–34.0)
MCHC: 35.1 g/dL (ref 30.0–36.0)
MCV: 93.7 fL (ref 78.0–100.0)
Monocytes Absolute: 0.6 10*3/uL (ref 0.1–1.0)
Monocytes Relative: 10 % (ref 3–12)
NEUTROS ABS: 3.3 10*3/uL (ref 1.7–7.7)
NEUTROS PCT: 54 % (ref 43–77)
PLATELETS: 175 10*3/uL (ref 150–400)
RBC: 4.31 MIL/uL (ref 4.22–5.81)
RDW: 13.1 % (ref 11.5–15.5)
WBC: 6 10*3/uL (ref 4.0–10.5)

## 2013-12-20 LAB — URINALYSIS, ROUTINE W REFLEX MICROSCOPIC
BILIRUBIN URINE: NEGATIVE
GLUCOSE, UA: 500 mg/dL — AB
Hgb urine dipstick: NEGATIVE
KETONES UR: NEGATIVE mg/dL
Leukocytes, UA: NEGATIVE
NITRITE: NEGATIVE
Protein, ur: 30 mg/dL — AB
Specific Gravity, Urine: 1.018 (ref 1.005–1.030)
Urobilinogen, UA: 0.2 mg/dL (ref 0.0–1.0)
pH: 5 (ref 5.0–8.0)

## 2013-12-20 LAB — COMPREHENSIVE METABOLIC PANEL
ALK PHOS: 80 U/L (ref 39–117)
ALT: 49 U/L (ref 0–53)
AST: 43 U/L — ABNORMAL HIGH (ref 0–37)
Albumin: 3.7 g/dL (ref 3.5–5.2)
Anion gap: 15 (ref 5–15)
BILIRUBIN TOTAL: 0.2 mg/dL — AB (ref 0.3–1.2)
BUN: 12 mg/dL (ref 6–23)
CHLORIDE: 95 meq/L — AB (ref 96–112)
CO2: 23 mEq/L (ref 19–32)
Calcium: 9.5 mg/dL (ref 8.4–10.5)
Creatinine, Ser: 1.23 mg/dL (ref 0.50–1.35)
GFR calc Af Amer: 86 mL/min — ABNORMAL LOW (ref >90)
GFR calc non Af Amer: 74 mL/min — ABNORMAL LOW (ref >90)
Glucose, Bld: 168 mg/dL — ABNORMAL HIGH (ref 70–99)
Potassium: 4.3 mEq/L (ref 3.7–5.3)
SODIUM: 133 meq/L — AB (ref 137–147)
Total Protein: 8.5 g/dL — ABNORMAL HIGH (ref 6.0–8.3)

## 2013-12-20 LAB — ACETAMINOPHEN LEVEL: Acetaminophen (Tylenol), Serum: <15 ug/mL (ref 10–30)

## 2013-12-20 LAB — SALICYLATE LEVEL

## 2013-12-20 LAB — URINE MICROSCOPIC-ADD ON

## 2013-12-20 LAB — CBG MONITORING, ED: Glucose-Capillary: 185 mg/dL — ABNORMAL HIGH (ref 70–99)

## 2013-12-20 LAB — ETHANOL: Alcohol, Ethyl (B): 165 mg/dL — ABNORMAL HIGH (ref 0–11)

## 2013-12-20 LAB — PRO B NATRIURETIC PEPTIDE: Pro B Natriuretic peptide (BNP): 70.9 pg/mL (ref 0–125)

## 2013-12-20 MED ORDER — DOLUTEGRAVIR SODIUM 50 MG PO TABS
50.0000 mg | ORAL_TABLET | Freq: Every day | ORAL | Status: DC
  Administered 2013-12-20 – 2013-12-23 (×4): 50 mg via ORAL
  Filled 2013-12-20 (×4): qty 1

## 2013-12-20 MED ORDER — LORAZEPAM 1 MG PO TABS
1.0000 mg | ORAL_TABLET | Freq: Once | ORAL | Status: AC
  Administered 2013-12-20: 1 mg via ORAL
  Filled 2013-12-20: qty 1

## 2013-12-20 MED ORDER — ONDANSETRON HCL 4 MG PO TABS: 4.0000 mg | ORAL_TABLET | Freq: Three times a day (TID) | ORAL | Status: DC | PRN

## 2013-12-20 MED ORDER — METFORMIN HCL 500 MG PO TABS
500.0000 mg | ORAL_TABLET | Freq: Two times a day (BID) | ORAL | Status: DC
  Administered 2013-12-20 – 2013-12-23 (×6): 500 mg via ORAL
  Filled 2013-12-20 (×6): qty 1

## 2013-12-20 MED ORDER — LISINOPRIL 10 MG PO TABS
10.0000 mg | ORAL_TABLET | Freq: Every day | ORAL | Status: DC
  Administered 2013-12-20 – 2013-12-23 (×4): 10 mg via ORAL
  Filled 2013-12-20 (×4): qty 1

## 2013-12-20 MED ORDER — ISOSORBIDE MONONITRATE ER 30 MG PO TB24
30.0000 mg | ORAL_TABLET | Freq: Every day | ORAL | Status: DC
  Administered 2013-12-20 – 2013-12-23 (×4): 30 mg via ORAL
  Filled 2013-12-20 (×4): qty 1

## 2013-12-20 MED ORDER — HYDRALAZINE HCL 25 MG PO TABS
25.0000 mg | ORAL_TABLET | Freq: Three times a day (TID) | ORAL | Status: DC
  Administered 2013-12-20 – 2013-12-23 (×10): 25 mg via ORAL
  Filled 2013-12-20 (×12): qty 1

## 2013-12-20 MED ORDER — INSULIN ASPART 100 UNIT/ML ~~LOC~~ SOLN
0.0000 [IU] | Freq: Three times a day (TID) | SUBCUTANEOUS | Status: DC
  Administered 2013-12-20: 2 [IU] via SUBCUTANEOUS
  Administered 2013-12-21: 3 [IU] via SUBCUTANEOUS
  Administered 2013-12-21: 2 [IU] via SUBCUTANEOUS
  Administered 2013-12-21 – 2013-12-22 (×2): 3 [IU] via SUBCUTANEOUS
  Administered 2013-12-22: 5 [IU] via SUBCUTANEOUS
  Administered 2013-12-22: 2 [IU] via SUBCUTANEOUS
  Administered 2013-12-23: 3 [IU] via SUBCUTANEOUS
  Administered 2013-12-23: 2 [IU] via SUBCUTANEOUS
  Filled 2013-12-20 (×8): qty 1

## 2013-12-20 MED ORDER — CARVEDILOL 6.25 MG PO TABS
6.2500 mg | ORAL_TABLET | Freq: Two times a day (BID) | ORAL | Status: DC
  Administered 2013-12-20 – 2013-12-23 (×6): 6.25 mg via ORAL
  Filled 2013-12-20 (×9): qty 1

## 2013-12-20 MED ORDER — ACETAMINOPHEN 325 MG PO TABS
650.0000 mg | ORAL_TABLET | ORAL | Status: DC | PRN
  Administered 2013-12-21 – 2013-12-22 (×2): 650 mg via ORAL
  Filled 2013-12-20 (×3): qty 2

## 2013-12-20 MED ORDER — EMTRICITABINE-TENOFOVIR DF 200-300 MG PO TABS
1.0000 | ORAL_TABLET | Freq: Every day | ORAL | Status: DC
  Administered 2013-12-20 – 2013-12-23 (×4): 1 via ORAL
  Filled 2013-12-20 (×4): qty 1

## 2013-12-20 MED ORDER — FUROSEMIDE 20 MG PO TABS: 40.0000 mg | ORAL_TABLET | ORAL | Status: DC

## 2013-12-20 NOTE — ED Notes (Signed)
Faxed IVC paperwork 

## 2013-12-20 NOTE — ED Notes (Signed)
Pt c/o suicidal ideation x 2 days. Pt got upset that his phone is not working. Pt reports, "I could just sit in the middle of the street and get hit by car." "If you would just let me leave, I shouldn't be here bothering you guys."

## 2013-12-20 NOTE — ED Notes (Signed)
Pt has "religous" necklace.  Security offered to place necklace in safe.  Pt declined.  Pt wanded by security.

## 2013-12-20 NOTE — Progress Notes (Signed)
The following facilities have been contacted regarding bed availability for inptx:    [facility]- per [place] currently at capacity but will call if anything should change  [facility]- per Baird Lyons at [facility]- per [facility] available, referral faxed  [facility]- per Misty Stanley a few adult beds available, referral faxed [facility]- per Dorathy D[place] at capacity  [facility]- per [facility] beds available, referral faxed [facility]- per Bradd Burner can fax, referral faxed  [facility]- per Joyce Gross can fax for review, referral faxed  [facility]- referral faxed for review [facility]- per Rosey Bath at [employer] [facility]- per [place] at [facility]- per [place] at capacity and already have "an extensive wait list"  [facility]- per [place] at [facility]- per [place] at capacity  [facility]- per Dannielle Huh at [facility] [facility]- per [place], may have a few d/c's and can fax, referral faxed  [facility]- per Porfirio Mylar no longer accept referrals  [facility]- per Dahlia Client at capacity  [facility]- per De Hollings[place] at capacity  [facility]- per Victorino Dike at [employer]- per [facility] requested referral be faxed, referral faxed     Tomi Bamberger Disposition [facility]

## 2013-12-20 NOTE — BH Assessment (Signed)
[facility] Assessment Progress Note   Called, gained clinical information from EDP Rancour @ 1335, as a tele assessment requested for the pt.  Tele assessment scheduled with this clinician for 1345.  Casimer Lanius, MS, [facility] Licensed Professional Counselor Triage Specialist

## 2013-12-20 NOTE — ED Notes (Signed)
Pt. Medicated per orders to help calm pt. Down.  Emotional support given.  Pt. Very tearful and crying. Sitter at the bedside.

## 2013-12-20 NOTE — ED Notes (Signed)
Pt given happy meal.  Pt continues to get up and states he is leaving.  Security called and are at bedside.  EDP Rancour advised pt that he cannot leave until he is evaluated by psych.

## 2013-12-20 NOTE — ED Notes (Signed)
Chest x-ray completed, ECG completed.  Pt. Woke up to take his medications.  Resting comfortablly. Vitals wnl

## 2013-12-20 NOTE — ED Notes (Signed)
This RN asked ED tech in triage to stand by while I go get scrubs to have pt change into. When I came back pt walked out.

## 2013-12-20 NOTE — BH Assessment (Signed)
Tele Assessment Note   Shaun Brennan is an 38 y.o. male that was assessed this day via tele assessment.  Pt presents to [facility] reporting SI with plan to sit in the road in traffic or overdose on medications.  Pt reported he has several stressors in his life stating, "I am African American, homosexual, I have HIV, Diabetes, I am a piece of shit.  I am done.  I am fucking done."  Pt sobbing uncontrollably and only able to answer a few assessment questions.  He stated he has tried to commit suicide in the past "years ago" by overdosing on medications.  Pt would not elaborate on this, just stated he did it because "I found out my life was over."  Pt stated he had a break up last night and is homeless.  He is from [place].  He was just admitted recently to [facility] for medical issues.  Pt denies HI or psychosis.  Pt stated he has no support, that his mother is in [place].  Pt did admit to having a court date that has passed for open container in [place], [place], where pt is from.  He recently came [facility] to [facility].  Pt oriented x 4, had depressed, sad, mood, fair eye contact, logical/coherent thought processes, pressured speech.  Pt began asking this clinician and pt's nurse in room to "please kill me!  Please kill me!"  No other information or history could be gathered on the pt at this time.  Called EDP Rancour who stated will order medication to calm the patient as he is inconsolable currently.  Consulted with EDP Rancour and Dahlia Byes, NP @ [phone] and pt accepted to [facility] for inpatient psychiatric hospitalization.  However, no beds at [facility] currently, so TTS to seek placement elsewhere.  Updated ED and TTS staff.  Axis I: 296.33 Major Depressive Disorder, Recurrent, Severe Axis II: Deferred Axis III:  Past Medical History  Diagnosis Date  . HIV (human immunodeficiency virus infection)   . Diabetes mellitus without complication   . Hypertension   . Chronic systolic heart failure     a. EF  15-20%, grade II DD, LA mild/mod dilated  . DM2 (diabetes mellitus, type 2)    Axis IV: economic problems, housing problems, other psychosocial or environmental problems, problems related to legal system/crime, problems related to social environment, problems with access to health care services and problems with primary support group Axis V: 11-20 some danger of hurting self or others possible OR occasionally fails to maintain minimal personal hygiene OR gross impairment in communication  Past Medical History:  Past Medical History  Diagnosis Date  . HIV (human immunodeficiency virus infection)   . Diabetes mellitus without complication   . Hypertension   . Chronic systolic heart failure     a. EF 15-20%, grade II DD, LA mild/mod dilated  . DM2 (diabetes mellitus, type 2)     Past Surgical History  Procedure Laterality Date  . Ankle surgery Right     Family History:  Family History  Problem Relation Age of Onset  . Other Father   . Diabetes Mother   . Stroke Paternal Grandfather     Social History:  reports that he has been smoking Cigarettes.  He has been smoking about 0.25 packs per day. He does not have any smokeless tobacco history on file. He reports that he drinks alcohol. He reports that he uses illicit drugs (Marijuana).  Additional Social History:  Alcohol / Drug Use  Pain Medications: see med list Prescriptions: see med list Over the Counter: see med list History of alcohol / drug use?:  (UTA) Longest period of sobriety (when/how long):  (UTA) Negative Consequences of Use:  (UTA) Withdrawal Symptoms:  (na)  CIWA: CIWA-Ar BP: 122/59 mmHg Pulse Rate: 96 COWS:    PATIENT STRENGTHS: (choose at least two) Capable of independent living General fund of knowledge  Allergies: No Known Allergies  Home Medications:  (Not in a hospital admission)  OB/GYN Status:  No LMP for male patient.  General Assessment Data Location of Assessment: [facility] ED Is this a Tele or  Face-to-Face Assessment?: Tele Assessment Is this an Initial Assessment or a Re-assessment for this encounter?: Initial Assessment Living Arrangements: Other (Comment) (Pt is homeless) Can pt return to current living arrangement?: Yes Admission Status: Involuntary Is patient capable of signing voluntary admission?: Yes Transfer from: Acute Hospital Referral Source: Self/Family/Friend     [facility] Crisis Care Plan Living Arrangements: Other (Comment) (Pt is homeless) Name of Psychiatrist: UTA Name of Therapist: UTA  Education Status Is patient currently in school?: No  Risk to self with the past 6 months Suicidal Ideation: Yes-Currently Present Suicidal Intent: Yes-Currently Present Is patient at risk for suicide?: Yes Suicidal Plan?: Yes-Currently Present Specify Current Suicidal Plan: to overdose or sit in traffic Access to Means: Yes Specify Access to Suicidal Means: can access medications and walk into traffic What has been your use of drugs/alcohol within the last 12 months?: UTA Previous Attempts/Gestures: Yes How many times?: 1 ("years ago" - pt overdosed on pills by report) Other Self Harm Risks: pt denies Triggers for Past Attempts: Other (Comment) ("Because my life was over") Intentional Self Injurious Behavior:  (UTA) Family Suicide History: No Recent stressful life event(s): Conflict (Comment);Loss (Comment);Financial Problems;Legal Issues;Recent negative physical changes;Turmoil (Comment);Other (Comment) (Recent move, breakup, medical, legal, no support, SI w/plan) Persecutory voices/beliefs?:  (UTA) Depression: Yes Depression Symptoms: Despondent;Tearfulness;Feeling worthless/self pity Substance abuse history and/or treatment for substance abuse?:  (UTA) Suicide prevention information given to non-admitted patients: Not applicable  Risk to Others within the past 6 months Homicidal Ideation: No Thoughts of Harm to Others: No Current Homicidal Intent: No Current  Homicidal Plan: No Access to Homicidal Means: No Identified Victim: na - pt denies History of harm to others?:  (UTA) Assessment of Violence: None Noted Violent Behavior Description: pt cooperative during assessment Does patient have access to weapons?: No Criminal Charges Pending?: Yes Describe Pending Criminal Charges: Open Container Does patient have a court date: Yes Court Date:  (Reports he missed his court date)  Psychosis Hallucinations: None noted Delusions: None noted  Mental Status Report Appear/Hygiene: Disheveled Eye Contact: Fair Motor Activity: Freedom of movement;Unremarkable Speech: Logical/coherent;Pressured Level of Consciousness: Alert;Drowsy Mood: Depressed;Despair;Sad;Worthless, low self-esteem Affect: Appropriate to circumstance Anxiety Level: Moderate Thought Processes: Coherent;Relevant Judgement: Impaired Orientation: Person;Place;Time;Situation Obsessive Compulsive Thoughts/Behaviors: Unable to Assess  Cognitive Functioning Concentration: Unable to Assess Memory: Unable to Assess IQ: Average Insight: Poor Impulse Control: Unable to Assess Appetite:  (UTA) Weight Loss:  (Unknown) Weight Gain:  (Unknown) Sleep: Unable to Assess Vegetative Symptoms: Unable to Assess  ADLScreening [facility] Assessment Services) Patient's cognitive ability adequate to safely complete daily activities?: Yes Patient able to express need for assistance with ADLs?: Yes Independently performs ADLs?: Yes (appropriate for developmental age)  Prior Inpatient Therapy Prior Inpatient Therapy:  (Unknown) Prior Therapy Dates: unk Prior Therapy Facilty/Provider(s): unk Reason for Treatment: unk  Prior Outpatient Therapy Prior Outpatient Therapy:  (Unknown) Prior Therapy Dates: unk  Prior Therapy Facilty/Provider(s): unk Reason for Treatment: unk  ADL Screening (condition at time of admission) Patient's cognitive ability adequate to safely complete daily activities?:  Yes Is the patient deaf or have difficulty hearing?: No Does the patient have difficulty seeing, even when wearing glasses/contacts?: No Does the patient have difficulty concentrating, remembering, or making decisions?: No Patient able to express need for assistance with ADLs?: Yes Does the patient have difficulty dressing or bathing?: No Independently performs ADLs?: Yes (appropriate for developmental age) Does the patient have difficulty walking or climbing stairs?: No  Home Assistive Devices/Equipment Home Assistive Devices/Equipment: None        Consults Spiritual Care Consult Needed: No Social Work Consult Needed: No Merchant navy officer (For Healthcare) Does patient have an advance directive?:  (UTA)    Additional Information 1:1 In Past 12 Months?: No CIRT Risk: No Elopement Risk: No Does patient have medical clearance?: Yes     Disposition:  Disposition Initial Assessment Completed for this Encounter: Yes Disposition of Patient: Referred to;Inpatient treatment program Type of inpatient treatment program: Adult  Casimer Lanius, MS, [facility] Licensed Professional Counselor Triage Specialist  12/20/2013 2:49 PM

## 2013-12-20 NOTE — ED Provider Notes (Signed)
CSN: [id number]     Arrival date & time 12/20/13  1052 History   First MD Initiated Contact with Patient 12/20/13 1206     Chief Complaint  Patient presents with  . Suicidal     (Consider location/radiation/quality/duration/timing/severity/associated sxs/prior Treatment) HPI Comments: Patient presents with suicidal ideation for the past several days. He states this is going on for several months but became worse today. He does not take any medications for depression. He wants to sit in the street and get run over. He denies any homicidal thoughts or hallucinations. He was discharged from hospital 10 days ago after being admitted for CHF and possible pneumonia. States compliance with medications. Denies any chest pain or shortness of breath. Denies any cough. He is now back on his HIV medications.  The history is provided by the patient.    Past Medical History  Diagnosis Date  . HIV (human immunodeficiency virus infection)   . Diabetes mellitus without complication   . Hypertension   . Chronic systolic heart failure     a. EF 15-20%, grade II DD, LA mild/mod dilated  . DM2 (diabetes mellitus, type 2)    Past Surgical History  Procedure Laterality Date  . Ankle surgery Right    Family History  Problem Relation Age of Onset  . Other Father   . Diabetes Mother   . Stroke Paternal Grandfather    History  Substance Use Topics  . Smoking status: Current Every Day Smoker -- 0.25 packs/day    Types: Cigarettes  . Smokeless tobacco: Not on file     Comment: about 5 cigarettes everyday  . Alcohol Use: Yes     Comment: socially about 6 pack per week     Review of Systems  Constitutional: Negative for fever, activity change and appetite change.  HENT: Negative for congestion, hearing loss and rhinorrhea.   Eyes: Negative for visual disturbance.  Respiratory: Negative for cough, chest tightness and shortness of breath.   Cardiovascular: Negative for chest pain.  Gastrointestinal:  Negative for nausea, vomiting and abdominal pain.  Genitourinary: Negative for dysuria, urgency and hematuria.  Musculoskeletal: Negative for arthralgias and myalgias.  Skin: Negative for rash.  Neurological: Negative for dizziness, weakness and headaches.  Psychiatric/Behavioral: Positive for suicidal ideas, sleep disturbance and self-injury. The patient is nervous/anxious.   A complete 10 system review of systems was obtained and all systems are negative except as noted in the HPI and PMH.      Allergies  Review of patient's allergies indicates no known allergies.  Home Medications   Prior to Admission medications   Medication Sig Start Date End Date Taking? Authorizing Provider  carvedilol (COREG) 6.25 MG tablet Take 1 tablet (6.25 mg total) by mouth 2 (two) times daily with a meal. 12/17/13   Rande Brunt, NP  dolutegravir (TIVICAY) 50 MG tablet Take 1 tablet (50 mg total) by mouth daily. 12/10/13   Velvet Bathe, MD  emtricitabine-tenofovir (TRUVADA) 200-300 MG per tablet Take 1 tablet by mouth daily. 12/10/13   Velvet Bathe, MD  furosemide (LASIX) 40 MG tablet Take 40 mg by mouth. Take only on Mondays and Fridays    Historical Provider, MD  glucose monitoring kit (FREESTYLE) monitoring kit 1 each by Does not apply route 4 (four) times daily - after meals and at bedtime. 1 month Diabetic Testing Supplies for QAC-QHS accuchecks. 12/12/13   Lorayne Marek, MD  hydrALAZINE (APRESOLINE) 25 MG tablet Take 1 tablet (25 mg total) by mouth every  8 (eight) hours. 12/10/13   Velvet Bathe, MD  isosorbide mononitrate (IMDUR) 30 MG 24 hr tablet Take 1 tablet (30 mg total) by mouth daily. 12/10/13   Velvet Bathe, MD  lisinopril (PRINIVIL,ZESTRIL) 10 MG tablet Take 1 tablet (10 mg total) by mouth daily. 12/10/13   Velvet Bathe, MD  metFORMIN (GLUCOPHAGE) 500 MG tablet Take 1 tablet (500 mg total) by mouth 2 (two) times daily with a meal. 12/10/13   Velvet Bathe, MD   BP 153/79  Pulse 93  Temp(Src) 97.8 F (36.6  C) (Oral)  Resp 18  Ht _0  (1.651 m)  Wt 200 lb (90.719 kg)  BMI 33.28 kg/m2  SpO2 100% Physical Exam  Nursing note and vitals reviewed. Constitutional: He is oriented to person, place, and time. He appears well-developed and well-nourished. No distress.  Tearful and anxious  HENT:  Head: Normocephalic and atraumatic.  Mouth/Throat: Oropharynx is clear and moist. No oropharyngeal exudate.  Eyes: Conjunctivae and EOM are normal. Pupils are equal, round, and reactive to light.  Neck: Normal range of motion. Neck supple.  No meningismus.  Cardiovascular: Normal rate, regular rhythm, normal heart sounds and intact distal pulses.   No murmur heard. Pulmonary/Chest: Effort normal and breath sounds normal. No respiratory distress.  Abdominal: Soft. There is no tenderness. There is no rebound and no guarding.  Musculoskeletal: Normal range of motion. He exhibits no edema and no tenderness.  Neurological: He is alert and oriented to person, place, and time. No cranial nerve deficit. He exhibits normal muscle tone. Coordination normal.  No ataxia on finger to nose bilaterally. No pronator drift. 5/5 strength throughout. CN 2-12 intact. Negative Romberg. Equal grip strength. Sensation intact. Gait is normal.   Skin: Skin is warm.  Psychiatric: He has a normal mood and affect. His behavior is normal.    ED Course  Procedures (including critical care time) Labs Review Labs Reviewed  COMPREHENSIVE METABOLIC PANEL - Abnormal; Notable for the following:    Sodium 133 (*)    Chloride 95 (*)    Glucose, Bld 168 (*)    Total Protein 8.5 (*)    AST 43 (*)    Total Bilirubin 0.2 (*)    GFR calc non Af Amer 74 (*)    GFR calc Af Amer 86 (*)    All other components within normal limits  ETHANOL - Abnormal; Notable for the following:    Alcohol, Ethyl (B) 165 (*)    All other components within normal limits  URINE RAPID DRUG SCREEN (HOSP PERFORMED) - Abnormal; Notable for the following:     Tetrahydrocannabinol POSITIVE (*)    All other components within normal limits  URINALYSIS, ROUTINE W REFLEX MICROSCOPIC - Abnormal; Notable for the following:    Glucose, UA 500 (*)    Protein, ur 30 (*)    All other components within normal limits  CBC WITH DIFFERENTIAL  PRO B NATRIURETIC PEPTIDE  URINE MICROSCOPIC-ADD ON  ACETAMINOPHEN LEVEL  SALICYLATE LEVEL    Imaging Review Dg Chest 2 View  12/20/2013   CLINICAL DATA:  Suicidal.  EXAM: CHEST  2 VIEW  COMPARISON:  PE study 12/07/2013.  Chest radiograph 12/06/2013.  FINDINGS: Cardiomegaly appears similar to prior exam. Calcified right peritracheal lymph node unchanged. Calcified granuloma in the right lung apex and a calcified granuloma in the left lung apex are stable. Lung volumes slightly low. Pulmonary vascularity normal. No airspace disease, effusion, or pneumothorax. The bones are unremarkable.  IMPRESSION: Cardiomegaly.  No  acute superimposed abnormality identified.  Evidence of prior granulomatous disease with calcified apical pulmonary nodules and calcified right paratracheal lymph node.   Electronically Signed   By: Curlene Dolphin M.D.   On: 12/20/2013 16:32     EKG Interpretation None      MDM   Final diagnoses:  Suicidal ideation   Suicidal thoughts for the past 2 days with plans traffic. Patient states "maybe I should just leave you guys alone". IVC paperwork completed to prevent him leaving as he does appear to be a threat to himself.  Screening labs obtained. Labs appear to be at baseline. No apparent evidence of CHF exacerbation. CXR clear.  D/w TTS. Who will evaluate.  Holding orders placed.    Ezequiel Essex, MD 12/20/13 [phone]

## 2013-12-20 NOTE — ED Notes (Signed)
Pt out of the bed stating he's leaving.  This RN and Felipa Eth, RN at pt bedside.  Security called.  Sitter at bedside.  Terrence, RN able to talk with pt and get him back in bed.  [employer] at bedside.

## 2013-12-20 NOTE — Progress Notes (Signed)
Pt has been declined at [facility] per Leah d/t acuity.   Tomi Bamberger Disposition MHT

## 2013-12-20 NOTE — ED Notes (Signed)
Pt returned back to triage area, pt given scrubs and plan of care discussed with pt. Charge RN made aware. Security made aware.

## 2013-12-21 LAB — CBG MONITORING, ED
GLUCOSE-CAPILLARY: 224 mg/dL — AB (ref 70–99)
Glucose-Capillary: 185 mg/dL — ABNORMAL HIGH (ref 70–99)
Glucose-Capillary: 207 mg/dL — ABNORMAL HIGH (ref 70–99)

## 2013-12-21 NOTE — BH Assessment (Signed)
Binnie Rail, [facility], confirmed adult unit is at capacity. Contacted the following facilities for placement:   BED AVAILABLE, CLINICAL INFORMATION HAS BEEN RECEIVED:  [facility], per [facility], per [employer]   AT CAPACITY:  [facility], per [facility], per Earl Lites, per [facility], per [facility], per [facility], per [facility], per [facility] per [facility], per [facility], per [facility], per [facility], per [facility], per [employer]  [facility], per [facility], per [facility], per [facility]   MULTIPLE ATTEMPTS TO CONTACT WITH NO RESPONSE:  [facility]  [facility]   PT DECLINED: [facility]   [address] [place], [place], [facility]  Triage Specialist  [phone]

## 2013-12-21 NOTE — ED Notes (Signed)
PT speaking to his Child psychotherapist on the phone.
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
## 2013-12-21 NOTE — BH Assessment (Signed)
[facility] Assessment Progress Note   Pt seen this day for reassessment @ 1125 via tele assessment by this clinician.  Pt continues to endorse SI with plan to overdose or sit in traffic.  Pt denies HI or psychosis.  Pt began crying, stating that he wouldn't be allowed to go back to [facility] and he was worried about a place to live, but that it wasn't his fault.  CSW met with pt and encouraged him to call back to plead his case there.  Pt denies HI or psychosis.  Pt stated he slept most of the day and night after being given medication yesterday.  Pt tearful, had depressed mood, was oriented x 4, had logical/coherent thought processes, normal speech and good eye contact.  Pt is pending several inpatient facilities for stabilization.  TTS will continue to seek placement for the pt.  Updated ED and TTS staff.  Shaune Pascal, MS, [facility], [place] Licensed Professional Counselor Triage Specialist

## 2013-12-21 NOTE — ED Notes (Signed)
PT very upset d/t news received from social work.  Attempting to call his Child psychotherapist.

## 2013-12-21 NOTE — Progress Notes (Signed)
Follow-up calls have been placed to the following facilities regarding inptx bed availability: [facility]- per [name] at capacity [facility]- per Trey Paula have one bed available and going through referrals now [facility]- per Arline Asp, still have beds available, will call once review of referral complete [facility]- per Maralyn Sago at [employer]- per Anselm Jungling, still going through referrals [facility]- per [name] at capacity, not accepting referrals [facility]- per Tammy at capacity  The following facilities have also been called and are at capacity: [facility]- per Kathaleen Bury- per [facility]- per [facility]- per [facility]- per [facility]- per [facility]- per Myrene Buddy [facility]- per [facility]- no longer accepting outside referrals [facility]- per [facility]- per Asencion Islam Disposition MHT

## 2013-12-21 NOTE — ED Notes (Signed)
Called [facility] RE pt belongings.  They stated that they have locked pt belongings in storage and that pt has 90 days to claim belongings from Art therapist.  Pt notified.

## 2013-12-22 LAB — CBG MONITORING, ED
GLUCOSE-CAPILLARY: 164 mg/dL — AB (ref 70–99)
GLUCOSE-CAPILLARY: 265 mg/dL — AB (ref 70–99)
Glucose-Capillary: 237 mg/dL — ABNORMAL HIGH (ref 70–99)
Glucose-Capillary: 181 mg/dL — ABNORMAL HIGH (ref 70–99)

## 2013-12-22 MED ORDER — LORAZEPAM 2 MG/ML IJ SOLN: 1.0000 mg | Freq: Four times a day (QID) | INTRAMUSCULAR | Status: DC | PRN

## 2013-12-22 MED ORDER — FOLIC ACID 1 MG PO TABS
1.0000 mg | ORAL_TABLET | Freq: Every day | ORAL | Status: DC
  Administered 2013-12-22 – 2013-12-23 (×2): 1 mg via ORAL
  Filled 2013-12-22 (×2): qty 1

## 2013-12-22 MED ORDER — LORAZEPAM 1 MG PO TABS: 0.0000 mg | ORAL_TABLET | Freq: Two times a day (BID) | ORAL | Status: DC

## 2013-12-22 MED ORDER — LORAZEPAM 1 MG PO TABS: 0.0000 mg | ORAL_TABLET | Freq: Four times a day (QID) | ORAL | Status: DC

## 2013-12-22 MED ORDER — ADULT MULTIVITAMIN W/MINERALS CH
1.0000 | ORAL_TABLET | Freq: Every day | ORAL | Status: DC
  Administered 2013-12-22 – 2013-12-23 (×2): 1 via ORAL
  Filled 2013-12-22 (×2): qty 1

## 2013-12-22 MED ORDER — THIAMINE HCL 100 MG/ML IJ SOLN: 100.0000 mg | Freq: Every day | INTRAMUSCULAR | Status: DC

## 2013-12-22 MED ORDER — LORAZEPAM 1 MG PO TABS: 1.0000 mg | ORAL_TABLET | Freq: Four times a day (QID) | ORAL | Status: DC | PRN

## 2013-12-22 MED ORDER — VITAMIN B-1 100 MG PO TABS
100.0000 mg | ORAL_TABLET | Freq: Every day | ORAL | Status: DC
  Administered 2013-12-22 – 2013-12-23 (×2): 100 mg via ORAL
  Filled 2013-12-22 (×2): qty 1

## 2013-12-22 NOTE — Progress Notes (Signed)
  CARE MANAGEMENT ED NOTE 12/22/2013  Patient:  Shaun Brennan, Shaun Brennan   Account Number:  [id number]  Date Initiated:  12/22/2013  Documentation initiated by:  Ferdinand Cava  Subjective/Objective Assessment:   38 yo homeless male presenting to the ED with c/o SI     Subjective/Objective Assessment Detail:     Action/Plan:   Patient provided list of local shelters for discharge planning   Action/Plan Detail:   Anticipated DC Date:       Status Recommendation to Physician:   Result of Recommendation:  Agreed    DC Planning Services  CM consult  Other    Choice offered to / List presented to:            Status of service:  Completed, signed off  ED Comments:   ED Comments Detail:  This CM spoke with the patient regarding his ED visit. The patient explained that due to loss of recent housing he has wanted to hurt himself and he confirmed that if he could be assisted with housing he would not want to hurt himself. This CM then contacted [name], at [facility], Shari Prows bridge counselor with [facility] at [phone], and ED LCSW. It was confirmed that the patient is no longer eligible for [facility] and will have to look into local shelters upon discharge status. This CM communicated this information to the patient and he verbalized understanding and received list of local shelters. This Cm encouraged the patient to continue working with Tad Moore as a contact to assist with resources that he is eligible for. The patient has the contact information and verbalizes understanding. No further CM needs identified at this time.

## 2013-12-22 NOTE — BH Assessment (Signed)
[facility] Assessment Progress Note  At the request of Mordecai Rasmussen, LCSW, this writer referred pt to [employer].  At 17:40 they confirmed receipt of referral, but a decision is pending as of this writing.  Doylene Canning, MA Triage Specialist 12/22/2013 @ 17:51

## 2013-12-22 NOTE — Progress Notes (Signed)
Patient still reporting SI per RN at [facility].  Seeking inpatient at this time for SI.   Once patient is stabilized with inpatient psych, patient plans to DC to a shelter since he cannot return to [facility] at this time.   Johnsie Cancel NP and [facility] to review patient in need of inpatient and possible reassessment.  Patient placed under IVC at this time and cannot come to obs.  [facility] to review as well, but no beds available.   Called for review of Beds:   [facility]:  Patient does not meet criteria for inpatient at this time, denied.  [facility]:  Still reviewing referrals but reporting that they cannot find his referral. Will resend referral.   Ashley Jacobs, MSW, LCSW

## 2013-12-23 DIAGNOSIS — F332 Major depressive disorder, recurrent severe without psychotic features: Secondary | ICD-10-CM

## 2013-12-23 LAB — CBG MONITORING, ED
GLUCOSE-CAPILLARY: 243 mg/dL — AB (ref 70–99)
Glucose-Capillary: 153 mg/dL — ABNORMAL HIGH (ref 70–99)

## 2013-12-23 NOTE — Progress Notes (Signed)
Currently:  Patient was reviewed at [facility] for admission and [facility]. He was denied at [facility] and there are no beds at [facility]  Follow-up calls have been placed to the following facilities regarding inptx bed availability:  [facility]- per [name] at capacity  [facility]- Denied  [facility]- per Maralyn Sago at capacity  [facility]- per [name] at capacity, not accepting referrals  [facility]- per Tammy at capacity  The following facilities have also been called and are at capacity:  [facility]- per Kathaleen Bury- per [facility]- per [facility]- per [facility] Rain[facility]- per [facility]- per [facility]- per Myrene Buddy  [facility]- per [facility]- no longer accepting outside referrals  [facility]- per [facility]- per Dahlia Client   [facility] will review referral for  patient for possible admission.  Patient was also discussed in LOS meeting this morning with intent to have MD review patient again for disposition. CSW will speak with Tanna Savoy and NP Renata Caprice to have patient tele-assessed for disposition.  Mordecai Rasmussen, [place], MSW Clinical Social Worker: TTS Dispositions [phone]

## 2013-12-23 NOTE — Discharge Instructions (Signed)
Please follow up as per your referrals.

## 2013-12-23 NOTE — Consult Note (Signed)
Telepsych Consultation   Reason for Consult:  Suicidal Ideation Referring Physician:  EDP Shaun Brennan is an 38 y.o. male.  Assessment: AXIS I:  Major Depression, Recurrent severe AXIS II:  Deferred AXIS III:   Past Medical History  Diagnosis Date  . HIV (human immunodeficiency virus infection)   . Diabetes mellitus without complication   . Hypertension   . Chronic systolic heart failure     a. EF 15-20%, grade II DD, LA mild/mod dilated  . DM2 (diabetes mellitus, type 2)    AXIS IV:  economic problems, housing problems, occupational problems, other psychosocial or environmental problems, problems related to social environment, problems with access to health care services and problems with primary support group AXIS V:  61-70 mild symptoms  Plan:  No evidence of imminent risk to self or others at present.   Patient does not meet criteria for psychiatric inpatient admission. Supportive therapy provided about ongoing stressors. Refer to IOP. Discussed crisis plan, support from social network, calling 911, coming to the Emergency Department, and calling Suicide Hotline.  Subjective:   Shaun Brennan is a 38 y.o. male patient admitted with reports of suicidal ideation with plan to OD or sit in traffic. Pt affirmed these plans on 09/13. This NP spoke to pt on the 14th and he stated that he would "not be suicidal if I had a place to stay; I'm homeless". Today, during this telepsych assessment, pt denies suicidal thoughts or plan. Pt reports that he is depressed because he does not have a place to live. Pt will be given referrals for local shelter resources as well as resources for outpatient therapy and psychiatry.   HPI:  Pt presented to ED with plan to sit in traffic or to OD on his medications. Has longstanding hx of HIV, homelessness, CHF, and DM2.  HPI Elements:   Location:  Psychiatric. Quality:  Stable, improving. Severity:  Severe. Timing:  Constant. Duration:  Chronic x  years. Context:  Exacerbation of underlying chronic suicidality secondary to lack of proper housing as stated by pt.  Past Psychiatric History: Past Medical History  Diagnosis Date  . HIV (human immunodeficiency virus infection)   . Diabetes mellitus without complication   . Hypertension   . Chronic systolic heart failure     a. EF 15-20%, grade II DD, LA mild/mod dilated  . DM2 (diabetes mellitus, type 2)     reports that he has been smoking Cigarettes.  He has been smoking about 0.25 packs per day. He does not have any smokeless tobacco history on file. He reports that he drinks alcohol. He reports that he uses illicit drugs (Marijuana). Family History  Problem Relation Age of Onset  . Other Father   . Diabetes Mother   . Stroke Paternal Grandfather    Family History Substance Abuse:  (UTA) Family Supports: No Living Arrangements: Other (Comment) (Pt is homeless) Can pt return to current living arrangement?: Yes Allergies:  No Known Allergies  ACT Assessment Complete:  Yes:    Educational Status    Risk to Self: Risk to self with the past 6 months Suicidal Ideation: Yes-Currently Present Suicidal Intent: Yes-Currently Present Is patient at risk for suicide?: Yes Suicidal Plan?: Yes-Currently Present Specify Current Suicidal Plan: to overdose or sit in traffic Access to Means: Yes Specify Access to Suicidal Means: can access medications and walk into traffic What has been your use of drugs/alcohol within the last 12 months?: UTA Previous Attempts/Gestures: Yes How many times?: 1 ("years  ago" - pt overdosed on pills by report) Other Self Harm Risks: pt denies Triggers for Past Attempts: Other (Comment) ("Because my life was over") Intentional Self Injurious Behavior:  (UTA) Family Suicide History: No Recent stressful life event(s): Conflict (Comment);Loss (Comment);Financial Problems;Legal Issues;Recent negative physical changes;Turmoil (Comment);Other (Comment) (Recent  move, breakup, medical, legal, no support, SI w/plan) Persecutory voices/beliefs?:  (UTA) Depression: Yes Depression Symptoms: Despondent;Tearfulness;Feeling worthless/self pity Substance abuse history and/or treatment for substance abuse?: Yes Suicide prevention information given to non-admitted patients: Not applicable  Risk to Others: Risk to Others within the past 6 months Homicidal Ideation: No Thoughts of Harm to Others: No Current Homicidal Intent: No Current Homicidal Plan: No Access to Homicidal Means: No Identified Victim: na - pt denies History of harm to others?:  (UTA) Assessment of Violence: None Noted Violent Behavior Description: pt cooperative during assessment Does patient have access to weapons?: No Criminal Charges Pending?: Yes Describe Pending Criminal Charges: Open Container Does patient have a court date: Yes Court Date:  (Reports he missed his court date)  Abuse:    Prior Inpatient Therapy: Prior Inpatient Therapy Prior Inpatient Therapy:  (Unknown) Prior Therapy Dates: unk Prior Therapy Facilty/Provider(s): unk Reason for Treatment: unk  Prior Outpatient Therapy: Prior Outpatient Therapy Prior Outpatient Therapy:  (Unknown) Prior Therapy Dates: unk Prior Therapy Facilty/Provider(s): unk Reason for Treatment: unk  Additional Information: Additional Information 1:1 In Past 12 Months?: No CIRT Risk: No Elopement Risk: No Does patient have medical clearance?: Yes                  Objective: Blood pressure 129/82, pulse 73, temperature 98.9 F (37.2 C), temperature source Oral, resp. rate 18, height $RemoveBe'5\' 5"'UMwQPUTBQ$  (1.651 m), weight 90.719 kg (200 lb), SpO2 100.00%.Body mass index is 33.28 kg/(m^2). Results for orders placed during the hospital encounter of 12/20/13 (from the past 72 hour(s))  CBG MONITORING, ED     Status: Abnormal   Collection Time    12/21/13  7:03 AM      Result Value Ref Range   Glucose-Capillary 185 (*) 70 - 99 mg/dL  CBG  MONITORING, ED     Status: Abnormal   Collection Time    12/21/13  2:24 PM      Result Value Ref Range   Glucose-Capillary 224 (*) 70 - 99 mg/dL  CBG MONITORING, ED     Status: Abnormal   Collection Time    12/21/13  5:37 PM      Result Value Ref Range   Glucose-Capillary 207 (*) 70 - 99 mg/dL  CBG MONITORING, ED     Status: Abnormal   Collection Time    12/22/13  8:02 AM      Result Value Ref Range   Glucose-Capillary 237 (*) 70 - 99 mg/dL  CBG MONITORING, ED     Status: Abnormal   Collection Time    12/22/13 12:13 PM      Result Value Ref Range   Glucose-Capillary 164 (*) 70 - 99 mg/dL  CBG MONITORING, ED     Status: Abnormal   Collection Time    12/22/13  4:53 PM      Result Value Ref Range   Glucose-Capillary 265 (*) 70 - 99 mg/dL  CBG MONITORING, ED     Status: Abnormal   Collection Time    12/22/13 10:34 PM      Result Value Ref Range   Glucose-Capillary 181 (*) 70 - 99 mg/dL  CBG MONITORING, ED  Status: Abnormal   Collection Time    12/23/13  8:30 AM      Result Value Ref Range   Glucose-Capillary 243 (*) 70 - 99 mg/dL  CBG MONITORING, ED     Status: Abnormal   Collection Time    12/23/13  1:07 PM      Result Value Ref Range   Glucose-Capillary 153 (*) 70 - 99 mg/dL   Labs are reviewed and are pertinent for Glucose 153.   Current Facility-Administered Medications  Medication Dose Route Frequency Provider Last Rate Last Dose  . acetaminophen (TYLENOL) tablet 650 mg  650 mg Oral Q4H PRN Ezequiel Essex, MD   650 mg at 12/22/13 2101  . carvedilol (COREG) tablet 6.25 mg  6.25 mg Oral BID WC Ezequiel Essex, MD   6.25 mg at 12/23/13 0844  . dolutegravir (TIVICAY) tablet 50 mg  50 mg Oral Daily Ezequiel Essex, MD   50 mg at 12/23/13 1038  . emtricitabine-tenofovir (TRUVADA) 200-300 MG per tablet 1 tablet  1 tablet Oral Daily Ezequiel Essex, MD   1 tablet at 12/23/13 1038  . folic acid (FOLVITE) tablet 1 mg  1 mg Oral Daily Ezequiel Essex, MD   1 mg at 12/23/13  1038  . furosemide (LASIX) tablet 40 mg  40 mg Oral See admin instructions Ezequiel Essex, MD      . hydrALAZINE (APRESOLINE) tablet 25 mg  25 mg Oral 3 times per day Ezequiel Essex, MD   25 mg at 12/23/13 1445  . insulin aspart (novoLOG) injection 0-9 Units  0-9 Units Subcutaneous TID WC Ezequiel Essex, MD   2 Units at 12/23/13 1401  . isosorbide mononitrate (IMDUR) 24 hr tablet 30 mg  30 mg Oral Daily Ezequiel Essex, MD   30 mg at 12/23/13 1038  . lisinopril (PRINIVIL,ZESTRIL) tablet 10 mg  10 mg Oral Daily Ezequiel Essex, MD   10 mg at 12/23/13 1038  . metFORMIN (GLUCOPHAGE) tablet 500 mg  500 mg Oral BID WC Ezequiel Essex, MD   500 mg at 12/23/13 0844  . multivitamin with minerals tablet 1 tablet  1 tablet Oral Daily Ezequiel Essex, MD   1 tablet at 12/23/13 1038  . ondansetron (ZOFRAN) tablet 4 mg  4 mg Oral Q8H PRN Ezequiel Essex, MD      . thiamine (VITAMIN B-1) tablet 100 mg  100 mg Oral Daily Ezequiel Essex, MD   100 mg at 12/23/13 1038   Current Outpatient Prescriptions  Medication Sig Dispense Refill  . carvedilol (COREG) 6.25 MG tablet Take 1 tablet (6.25 mg total) by mouth 2 (two) times daily with a meal.  60 tablet  3  . dolutegravir (TIVICAY) 50 MG tablet Take 1 tablet (50 mg total) by mouth daily.  30 tablet  0  . emtricitabine-tenofovir (TRUVADA) 200-300 MG per tablet Take 1 tablet by mouth daily.  30 tablet  0  . furosemide (LASIX) 40 MG tablet Take 40 mg by mouth. Take only on Mondays and Fridays      . hydrALAZINE (APRESOLINE) 25 MG tablet Take 1 tablet (25 mg total) by mouth every 8 (eight) hours.  90 tablet  0  . isosorbide mononitrate (IMDUR) 30 MG 24 hr tablet Take 1 tablet (30 mg total) by mouth daily.  30 tablet  0  . lisinopril (PRINIVIL,ZESTRIL) 10 MG tablet Take 1 tablet (10 mg total) by mouth daily.  30 tablet  0  . metFORMIN (GLUCOPHAGE) 500 MG tablet Take 1 tablet (500 mg total)  by mouth 2 (two) times daily with a meal.  60 tablet  0  . glucose monitoring  kit (FREESTYLE) monitoring kit 1 each by Does not apply route 4 (four) times daily - after meals and at bedtime. 1 month Diabetic Testing Supplies for QAC-QHS accuchecks.  1 each  1    Psychiatric Specialty Exam:     Blood pressure 129/82, pulse 73, temperature 98.9 F (37.2 C), temperature source Oral, resp. rate 18, height $RemoveBe'5\' 5"'ImFfEvBpU$  (1.651 m), weight 90.719 kg (200 lb), SpO2 100.00%.Body mass index is 33.28 kg/(m^2).  General Appearance: Fairly Groomed  Engineer, water::  Good  Speech:  Clear and Coherent  Volume:  Normal  Mood:  Anxious and Depressed  Affect:  Appropriate  Thought Process:  Coherent  Orientation:  Full (Time, Place, and Person)  Thought Content:  WDL  Suicidal Thoughts:  No  Homicidal Thoughts:  No  Memory:  Immediate;   Fair Recent;   Fair Remote;   Fair  Judgement:  Fair  Insight:  Fair  Psychomotor Activity:  Normal  Concentration:  Good  Recall:  Fair  Akathisia:  No  Handed:  Right  AIMS (if indicated):     Assets:  Desire for Improvement Resilience  Sleep:      Treatment Plan Summary: -Continue home meds (no scripts necessary) -Rescind IVC Discharge home with outpatient resources for followup for shelters, psychiatry, and therapy.   *Case reviewed with Dr. Dwyane Dee  Disposition: See above  Benjamine Mola, FNP-BC 12/23/2013 5:09 PM

## 2013-12-23 NOTE — ED Notes (Signed)
PT HAS COMPLETED PSYCH ASSESSMENT. AWAIT DISPO

## 2013-12-23 NOTE — ED Notes (Signed)
IN TO TALK WITH PATIENT, ASKED HIM ABOUT HOW HE IS FEELING, THAT WE HAVE BEEN HEARING HIM LAUGHING AND TALKING WHILE WATCHING TELEVISION. HE STATES "I GUESS I AM FEELING SOME BETTER" "IF YALL ARE READY FOR ME TO GO THEN  I WILL GO".

## 2013-12-23 NOTE — ED Provider Notes (Signed)
38 year old male who presented is to go with reports of suicidal ideation. He has been seen and assessed by psychiatry and has reported that he is no longer suicidal. Psychiatric evaluator has asked me to recent and the IVC. I've seen and evaluated the patient and he is in agreement with plan and continues to voice and he is no longer suicidal.  I reviewed the psychiatric note.  IVC rescinded.    Hilario Quarry, MD 12/23/13 [phone]

## 2013-12-23 NOTE — ED Notes (Signed)
PATIENT CAN BE HEARD OUTSIDE HIS ROOM TALKING AND LAUGHING AT THE TELEVISION

## 2013-12-25 NOTE — Consult Note (Signed)
Case discussed, and agree with plan 

## 2013-12-31 ENCOUNTER — Ambulatory Visit (HOSPITAL_COMMUNITY)
Admission: RE | Admit: 2013-12-31 | Discharge: 2013-12-31 | Disposition: A | Discharge status: Home / Self Care | Payer: Self-pay | Source: Ambulatory Visit | Attending: Internal Medicine | Admitting: Internal Medicine

## 2013-12-31 ENCOUNTER — Encounter (HOSPITAL_COMMUNITY): Payer: Self-pay

## 2013-12-31 VITALS — BP 130/84 | HR 93 | Resp 18 | Wt 203.4 lb

## 2013-12-31 DIAGNOSIS — Z5900 Homelessness unspecified: Secondary | ICD-10-CM | POA: Insufficient documentation

## 2013-12-31 DIAGNOSIS — F17200 Nicotine dependence, unspecified, uncomplicated: Secondary | ICD-10-CM | POA: Insufficient documentation

## 2013-12-31 DIAGNOSIS — E119 Type 2 diabetes mellitus without complications: Secondary | ICD-10-CM | POA: Insufficient documentation

## 2013-12-31 DIAGNOSIS — I5022 Chronic systolic (congestive) heart failure: Secondary | ICD-10-CM | POA: Insufficient documentation

## 2013-12-31 DIAGNOSIS — I509 Heart failure, unspecified: Secondary | ICD-10-CM | POA: Insufficient documentation

## 2013-12-31 DIAGNOSIS — F1210 Cannabis abuse, uncomplicated: Secondary | ICD-10-CM | POA: Insufficient documentation

## 2013-12-31 DIAGNOSIS — B20 Human immunodeficiency virus [HIV] disease: Secondary | ICD-10-CM | POA: Insufficient documentation

## 2013-12-31 DIAGNOSIS — Z79899 Other long term (current) drug therapy: Secondary | ICD-10-CM | POA: Insufficient documentation

## 2013-12-31 DIAGNOSIS — F1010 Alcohol abuse, uncomplicated: Secondary | ICD-10-CM | POA: Insufficient documentation

## 2013-12-31 DIAGNOSIS — I10 Essential (primary) hypertension: Secondary | ICD-10-CM | POA: Insufficient documentation

## 2013-12-31 DIAGNOSIS — Z833 Family history of diabetes mellitus: Secondary | ICD-10-CM | POA: Insufficient documentation

## 2013-12-31 LAB — BASIC METABOLIC PANEL
ANION GAP: 13 (ref 5–15)
BUN: 12 mg/dL (ref 6–23)
CALCIUM: 9 mg/dL (ref 8.4–10.5)
CO2: 24 mEq/L (ref 19–32)
CREATININE: 1.04 mg/dL (ref 0.50–1.35)
Chloride: 100 mEq/L (ref 96–112)
Glucose, Bld: 204 mg/dL — ABNORMAL HIGH (ref 70–99)
Potassium: 4.8 mEq/L (ref 3.7–5.3)
Sodium: 137 mEq/L (ref 137–147)

## 2013-12-31 MED ORDER — LISINOPRIL 10 MG PO TABS: 10.0000 mg | ORAL_TABLET | Freq: Every day | ORAL | Status: DC

## 2013-12-31 MED ORDER — CARVEDILOL 6.25 MG PO TABS: 9.3750 mg | ORAL_TABLET | Freq: Two times a day (BID) | ORAL | Status: DC

## 2013-12-31 MED ORDER — ISOSORBIDE MONONITRATE ER 30 MG PO TB24: 30.0000 mg | ORAL_TABLET | Freq: Every day | ORAL | Status: DC

## 2013-12-31 MED ORDER — HYDRALAZINE HCL 25 MG PO TABS: 25.0000 mg | ORAL_TABLET | Freq: Three times a day (TID) | ORAL | Status: DC

## 2013-12-31 NOTE — Clinical Social Work Psychosocial (Addendum)
Clinical Social Work Department BRIEF PSYCHOSOCIAL ASSESSMENT 12/10/2013  Patient:  Shaun Brennan, Shaun Brennan     Account Number:  [id number]     Admit date:  12/06/2013  Clinical Social Worker:  Elam Dutch  Date/Time:  12/10/2013 02:45 PM  Referred by:  Physician  Date Referred:  12/08/2013 Referred for  Homelessness   Other Referral:   Interview type:  Other - See comment Other interview type:   Patient and friend Gearlean Alf    PSYCHOSOCIAL DATA Living Status:  FRIEND(S) Admitted from facility:   Level of care:   Primary support name:  Gearlean Alf   (c)  [phone] Primary support relationship to patient:  FRIEND Degree of support available:   Good support per patient    CURRENT CONCERNS Current Concerns  Other - See comment   Other Concerns:   Homelessness    SOCIAL WORK ASSESSMENT / PLAN 38 year old male- referred to CSW due to homelessness.  CSW met with patient and his friend Broadus John this afternoon. They stated that they are working on getting a place and currently plan to stay in a hotel until they can make some other arrangements. Prior to this- they were staying at the [facility]- but do not wish to return there. CSW arranged for [employer] taxi to transport patient and Broadus John to their hotel.  Patient noted that have many belongings at this hospital.  CSW is speaking with [place]- Director of Clinical Social Work to determine if it is possible to seek the [employer] for patient and Broadus John.  CSW discussed briefly with them and Broadus John left his cell phone number for follow up. Patient verbalized that he has been very upset lately about his homelessness; Broadus John stated that he feels patient needs to see a counselor. He is currently not set up with any mental health services   Assessment/plan status:  Psychosocial Support/Ongoing Assessment of Needs Other assessment/ plan:   Information/referral to community resources:   Out patient Psychiatric  support services provided to patient. Patient verbalized that he would follow up and make appointment to see a counselor once he got settled at the hotel.    PATIENT'S/FAMILY'S RESPONSE TO PLAN OF CARE: Patient was noted to be alert, oriented and anxious to leave today. He was cooperatie with this CSW and verbalized his appreciation for the services provided by the hospital, staff and this CSW. He stated that he doesn't like being homeless and would be thankful for any help possible.  CSW again discussed the [employer] and stated that follow up would be made once a determination had been made. Patient and Broadus John verbalized an understand of this.  No further CSW needs identified at this time. CSW signing off.

## 2013-12-31 NOTE — Progress Notes (Signed)
Patient ID: Shaun Brennan, male   DOB: [birth year], 38 y.o.   MRN: [id number]  PCP: Dr. Orpah Cobb [facility]) Primary Cardiologist: Dr. Gala Romney  HPI:  Shaun Brennan is a 38 yo male with a history of HIV, DM2, HTN, tobacco abuse and chronic systolic heart failure.   Negative Hep B surface antigen and negative HCV AB; RPR non-reactive.   ECHO (12/08/2013): EF 15-20%, grade II DD, mild/mod LA, RV normal  Follow up for Heart Failure: Last visit increased coreg to 6.25 mg BID which he tolerated. Patient was living in hotel through [employer] and got kicked out of hotel because he says the neighbors said they were too loud. Came to ED and said he was going to commit suicide. Remains completely homeless now. Taking medications as prescribed, but is out of his lasix he thinks he left at the hotel. Denies SOB, orthopnea, PND or CP. Walking with a cane. Able to walk about -2 blocks before stopping. Can't weigh because he does not have scale. Trying to follow a low salt diet but he is staying at shelters currently and is drinking less than 2L a day.  ROS: All systems negative except as listed in HPI, PMH and Problem List.  SH:  History   Social History  . Marital Status: Single    Spouse Name: N/A    Number of Children: N/A  . Years of Education: N/A   Occupational History  . Not on file.   Social History Main Topics  . Smoking status: Current Every Day Smoker -- 0.25 packs/day    Types: Cigarettes  . Smokeless tobacco: Not on file     Comment: about 10 cigarettes a week  . Alcohol Use: Yes     Comment: socially about 6 pack per week   . Drug Use: Yes    Special: Marijuana  . Sexual Activity: Yes   Other Topics Concern  . Not on file   Social History Narrative   Currently homeless    FH:  Family History  Problem Relation Age of Onset  . Other Father   . Diabetes Mother   . Stroke Paternal Grandfather     Past Medical History  Diagnosis Date  . HIV (human  immunodeficiency virus infection)   . Diabetes mellitus without complication   . Hypertension   . Chronic systolic heart failure     a. EF 15-20%, grade II DD, LA mild/mod dilated  . DM2 (diabetes mellitus, type 2)     Current Outpatient Prescriptions  Medication Sig Dispense Refill  . carvedilol (COREG) 6.25 MG tablet Take 1 tablet (6.25 mg total) by mouth 2 (two) times daily with a meal.  60 tablet  3  . dolutegravir (TIVICAY) 50 MG tablet Take 1 tablet (50 mg total) by mouth daily.  30 tablet  0  . emtricitabine-tenofovir (TRUVADA) 200-300 MG per tablet Take 1 tablet by mouth daily.  30 tablet  0  . furosemide (LASIX) 40 MG tablet Take 40 mg by mouth. Take only on Mondays and Fridays      . glucose monitoring kit (FREESTYLE) monitoring kit 1 each by Does not apply route 4 (four) times daily - after meals and at bedtime. 1 month Diabetic Testing Supplies for QAC-QHS accuchecks.  1 each  1  . hydrALAZINE (APRESOLINE) 25 MG tablet Take 1 tablet (25 mg total) by mouth every 8 (eight) hours.  90 tablet  0  . isosorbide mononitrate (IMDUR) 30 MG 24 hr tablet  Take 1 tablet (30 mg total) by mouth daily.  30 tablet  0  . lisinopril (PRINIVIL,ZESTRIL) 10 MG tablet Take 1 tablet (10 mg total) by mouth daily.  30 tablet  0  . metFORMIN (GLUCOPHAGE) 500 MG tablet Take 1 tablet (500 mg total) by mouth 2 (two) times daily with a meal.  60 tablet  0   No current facility-administered medications for this encounter.    Filed Vitals:   12/31/13 1437  BP: 130/84  Pulse: 93  Resp: 18  Weight: 203 lb 6 oz (92.25 kg)  SpO2: 100%    PHYSICAL EXAM: General:  Well appearing. No resp difficulty HEENT: normal Neck: supple. JVP flat. Carotids 2+ bilaterally; no bruits. No lymphadenopathy or thryomegaly appreciated. Cor: PMI normal. Regular rate & rhythm. No rubs, gallops or murmurs. Lungs: clear Abdomen: soft, nontender, nondistended. No hepatosplenomegaly. No bruits or masses. Good bowel  sounds. Extremities: no cyanosis, clubbing, rash, edema Neuro: alert & orientedx3, cranial nerves grossly intact. Moves all 4 extremities w/o difficulty. Affect pleasant.   ASSESSMENT & PLAN:  1) Chronic systolic HF: NICM?, EF 17-79%, grade II DD (11/2013) - NYHA II symptoms and volume status stable. Will continue lasix 40 mg on Mondays and Fridays. Check BMET and pro-BNP today.  - Will increase coreg to 9.375 mg BID. Instructed to call any dizziness or fatigue.  - Continue lisinopril 10 mg daily.  - Continue hydralazine 25 mg TID and Imdur 30 mg daily. - Next visit try to add Spironolactone if he continues to follow up.  - End of November/early December will need to repeat ECHO and if EF remains less than 35% will need EP referral for ICD. QRS narrow, no CRT-D  - Discussed the importance of the HF clinic and the need to weigh daily, follow a low sodium diet, drink less than 2L a day and follow up in the clinic.  2) HTN - Stable. As above will increase coreg for LV dysfunction.  3) HIV - Following with ID clinic 4) Social Issues - Was living in hotel with [place], however got kicked out and ended up at ED a week ago with thoughts of trying to commit suicide. He is now back to being homeless. Will see if CSW can help patient with any resources next visit.  5) Tobacco abuse: - Reports smoking about 10 cigarettes a week. Encouraged to try and abstain completely.  6) Alcohol abuse: - Discussed trying to abstain completely. He has cut down and is now drinking only about 6 pack per week.   Follow up 1 month [name] B NP-C 2:43 PM

## 2013-12-31 NOTE — Patient Instructions (Signed)
Increase your coreg to 9.375 mg (1 1/2 tablets) in the morning and 9.375 mg (1 1/2 tablets) in the evening.  Start taking your lasix 40 mg (1 tablet) again on Mondays and Fridays.  When you get close to running out of your HIV medications call your social worker.  Your diabetes medications come from [facility].  All other medications come from our Clinic.  Call any issues [phone]  Follow up in 1 month  Do the following things EVERYDAY: 1) Weigh yourself in the morning before breakfast. Write it down and keep it in a log. 2) Take your medicines as prescribed 3) Eat low salt foods-Limit salt (sodium) to 2000 mg per day.  4) Stay as active as you can everyday 5) Limit all fluids for the day to less than 2 liters 6)

## 2014-01-06 ENCOUNTER — Telehealth: Payer: Self-pay | Admitting: *Deleted

## 2014-01-06 ENCOUNTER — Telehealth (HOSPITAL_COMMUNITY): Payer: Self-pay | Admitting: Vascular Surgery

## 2014-01-06 MED ORDER — DOLUTEGRAVIR SODIUM 50 MG PO TABS: 50.0000 mg | ORAL_TABLET | Freq: Every day | ORAL | Status: DC

## 2014-01-06 MED ORDER — EMTRICITABINE-TENOFOVIR DF 200-300 MG PO TABS: 1.0000 | ORAL_TABLET | Freq: Every day | ORAL | Status: DC

## 2014-01-06 NOTE — Telephone Encounter (Signed)
Shaun Potash, NP spoke with patient and advised pt to contact ID clinic (Dr.Comers' office) for Truvada refills Contact information [phone], given to patient

## 2014-01-06 NOTE — Telephone Encounter (Signed)
Shaun Brennan Pt need a refill Truvada.[place] [facility] call pt he would like to talk to you asap.. Please advise

## 2014-01-06 NOTE — Telephone Encounter (Signed)
Patient called stating he was "almost out of that blue pill."  Patient filled out ADAP application, but it has not yet been approved.  Patient advised it can take a while for ADAP to be approved.  Rx has been sent to [facility] on [address] for when the patient's application is approved.  Andree Coss, RN

## 2014-01-13 ENCOUNTER — Other Ambulatory Visit: Payer: Self-pay | Admitting: Infectious Diseases

## 2014-01-13 ENCOUNTER — Encounter: Payer: Self-pay | Admitting: Infectious Diseases

## 2014-01-13 ENCOUNTER — Ambulatory Visit (INDEPENDENT_AMBULATORY_CARE_PROVIDER_SITE_OTHER): Payer: Self-pay | Admitting: Infectious Diseases

## 2014-01-13 ENCOUNTER — Other Ambulatory Visit: Payer: Self-pay | Admitting: *Deleted

## 2014-01-13 VITALS — BP 155/97 | HR 96 | Temp 98.2°F | Ht 65.0 in | Wt 198.0 lb

## 2014-01-13 DIAGNOSIS — I10 Essential (primary) hypertension: Secondary | ICD-10-CM

## 2014-01-13 DIAGNOSIS — Z23 Encounter for immunization: Secondary | ICD-10-CM

## 2014-01-13 DIAGNOSIS — B20 Human immunodeficiency virus [HIV] disease: Secondary | ICD-10-CM

## 2014-01-13 DIAGNOSIS — E118 Type 2 diabetes mellitus with unspecified complications: Secondary | ICD-10-CM

## 2014-01-13 DIAGNOSIS — Z21 Asymptomatic human immunodeficiency virus [HIV] infection status: Secondary | ICD-10-CM

## 2014-01-13 MED ORDER — DOLUTEGRAVIR SODIUM 50 MG PO TABS: 50.0000 mg | ORAL_TABLET | Freq: Every day | ORAL | Status: DC

## 2014-01-13 MED ORDER — EMTRICITABINE-TENOFOVIR DF 200-300 MG PO TABS: 1.0000 | ORAL_TABLET | Freq: Every day | ORAL | Status: DC

## 2014-01-13 NOTE — Assessment & Plan Note (Signed)
bp elevated today. Will f/u with CHF team.

## 2014-01-13 NOTE — Assessment & Plan Note (Addendum)
He has f/u at [facility].

## 2014-01-13 NOTE — Assessment & Plan Note (Addendum)
He has multiple needs today: Dental  ADAP Housing [employer]  Will work on getting these met for him. I have asked him to not take his HIV meds unless he has all of them.  He is being followed by [employer]  Will see him back in 6 weeks.  He gets PNVX. Condoms. Has gotten flu shot.

## 2014-01-13 NOTE — Telephone Encounter (Signed)
ADAP Application 

## 2014-01-13 NOTE — Progress Notes (Signed)
   Subjective:    Patient ID: Shaun Brennan, male    DOB: [birth year], 38 y.o.   MRN: [id number]  HPI 38 y.o. male with history of HIV diagnosed approximately 10 years ago not been on any medication (previoulsy followed in [place]), diabetes mellitus, hypertension, history of CHF who states has been out of his medications for approximately a month. He had never been on ART prior. He was homeless, and presented to the emergency room (8-29) with a one-month history of a productive cough occasionally yellowish, shortness of breath, chest pain associated with hacking cough. He was found to have EF 15-20% and was eval by CHF team. He was started on DTGV/TRV and obtained meds from the outpt pharmacy. He was d/c home on 9-2. He ran out of TRV 1 week ago and has been taking DTGV alone since then.  His breathing has been good since he left hospital. He has occas cough (states he has a little cold).  Has been to CHF clinic and has f/u clinic 10-23 with [facility]. Still working on housing. Has been going to shelters, getting food there.   HIV 1 RNA Quant (copies/mL)  Date Value  12/06/2013 10387*     CD4 T Cell Abs (/uL)  Date Value  12/06/2013 270*    Review of Systems  Constitutional: Negative for appetite change and unexpected weight change.  Respiratory: Negative for shortness of breath.   Gastrointestinal: Negative for diarrhea and constipation.  Genitourinary: Negative for difficulty urinating.       Objective:   Physical Exam  Constitutional: He appears well-developed and well-nourished.  HENT:  Mouth/Throat: No oropharyngeal exudate.  Eyes: EOM are normal. Pupils are equal, round, and reactive to light.  Neck: Neck supple.  Cardiovascular: Normal rate, regular rhythm and normal heart sounds.   Pulmonary/Chest: Effort normal and breath sounds normal.  Abdominal: Soft. Bowel sounds are normal. He exhibits no distension. There is no tenderness.  Musculoskeletal: He exhibits no edema.    Lymphadenopathy:    He has no cervical adenopathy.          Assessment & Plan:

## 2014-01-20 ENCOUNTER — Telehealth (HOSPITAL_COMMUNITY): Payer: Self-pay | Admitting: Anesthesiology

## 2014-01-23 NOTE — Telephone Encounter (Signed)
error 

## 2014-01-30 ENCOUNTER — Encounter (HOSPITAL_COMMUNITY): Payer: Self-pay

## 2014-02-04 ENCOUNTER — Other Ambulatory Visit: Payer: Self-pay | Admitting: Licensed Clinical Social Worker

## 2014-02-04 DIAGNOSIS — B20 Human immunodeficiency virus [HIV] disease: Secondary | ICD-10-CM

## 2014-02-04 MED ORDER — EMTRICITABINE-TENOFOVIR DF 200-300 MG PO TABS: 1.0000 | ORAL_TABLET | Freq: Every day | ORAL | Status: DC

## 2014-02-04 MED ORDER — DOLUTEGRAVIR SODIUM 50 MG PO TABS: 50.0000 mg | ORAL_TABLET | Freq: Every day | ORAL | Status: DC

## 2014-02-11 ENCOUNTER — Inpatient Hospital Stay (HOSPITAL_COMMUNITY): Admission: RE | Admit: 2014-02-11 | Payer: Self-pay | Source: Ambulatory Visit

## 2014-02-24 ENCOUNTER — Other Ambulatory Visit: Payer: Self-pay

## 2014-03-09 ENCOUNTER — Encounter: Payer: Self-pay | Admitting: Licensed Clinical Social Worker

## 2014-03-09 ENCOUNTER — Encounter (HOSPITAL_COMMUNITY): Payer: Self-pay

## 2014-03-09 ENCOUNTER — Ambulatory Visit (HOSPITAL_COMMUNITY)
Admission: RE | Admit: 2014-03-09 | Discharge: 2014-03-09 | Disposition: A | Discharge status: Home / Self Care | Payer: Self-pay | Source: Ambulatory Visit | Attending: Internal Medicine | Admitting: Internal Medicine

## 2014-03-09 VITALS — BP 170/120 | HR 99 | Wt 194.5 lb

## 2014-03-09 DIAGNOSIS — B20 Human immunodeficiency virus [HIV] disease: Secondary | ICD-10-CM

## 2014-03-09 DIAGNOSIS — F17210 Nicotine dependence, cigarettes, uncomplicated: Secondary | ICD-10-CM | POA: Insufficient documentation

## 2014-03-09 DIAGNOSIS — E119 Type 2 diabetes mellitus without complications: Secondary | ICD-10-CM | POA: Insufficient documentation

## 2014-03-09 DIAGNOSIS — F1010 Alcohol abuse, uncomplicated: Secondary | ICD-10-CM | POA: Insufficient documentation

## 2014-03-09 DIAGNOSIS — I10 Essential (primary) hypertension: Secondary | ICD-10-CM | POA: Insufficient documentation

## 2014-03-09 DIAGNOSIS — F17200 Nicotine dependence, unspecified, uncomplicated: Secondary | ICD-10-CM

## 2014-03-09 DIAGNOSIS — Z79899 Other long term (current) drug therapy: Secondary | ICD-10-CM | POA: Insufficient documentation

## 2014-03-09 DIAGNOSIS — Z720 Tobacco use: Secondary | ICD-10-CM | POA: Insufficient documentation

## 2014-03-09 DIAGNOSIS — Z21 Asymptomatic human immunodeficiency virus [HIV] infection status: Secondary | ICD-10-CM | POA: Insufficient documentation

## 2014-03-09 DIAGNOSIS — I5022 Chronic systolic (congestive) heart failure: Secondary | ICD-10-CM | POA: Insufficient documentation

## 2014-03-09 DIAGNOSIS — Z590 Homelessness: Secondary | ICD-10-CM | POA: Insufficient documentation

## 2014-03-09 LAB — PRO B NATRIURETIC PEPTIDE: Pro B Natriuretic peptide (BNP): 130.6 pg/mL — ABNORMAL HIGH (ref 0–125)

## 2014-03-09 LAB — BASIC METABOLIC PANEL
Anion gap: 13 (ref 5–15)
BUN: 10 mg/dL (ref 6–23)
CALCIUM: 9.6 mg/dL (ref 8.4–10.5)
CO2: 26 mEq/L (ref 19–32)
Chloride: 96 mEq/L (ref 96–112)
Creatinine, Ser: 0.85 mg/dL (ref 0.50–1.35)
GFR calc non Af Amer: >90 mL/min (ref >90)
GLUCOSE: 155 mg/dL — AB (ref 70–99)
POTASSIUM: 4.4 meq/L (ref 3.7–5.3)
Sodium: 135 mEq/L — ABNORMAL LOW (ref 137–147)

## 2014-03-09 MED ORDER — ISOSORBIDE MONONITRATE ER 30 MG PO TB24: 30.0000 mg | ORAL_TABLET | Freq: Every day | ORAL | Status: DC

## 2014-03-09 MED ORDER — HYDRALAZINE HCL 50 MG PO TABS: 50.0000 mg | ORAL_TABLET | Freq: Three times a day (TID) | ORAL | Status: DC

## 2014-03-09 MED ORDER — CARVEDILOL 12.5 MG PO TABS: 12.5000 mg | ORAL_TABLET | Freq: Two times a day (BID) | ORAL | Status: DC

## 2014-03-09 MED ORDER — LISINOPRIL 10 MG PO TABS: 10.0000 mg | ORAL_TABLET | Freq: Every day | ORAL | Status: DC

## 2014-03-09 NOTE — Progress Notes (Signed)
Patient ID: Shaun Brennan, male   DOB: 04/16/1975, 38 y.o.   MRN: [id number]  PCP: Dr. Annitta Needs [facility]) Primary Cardiologist: Dr. Haroldine Laws  HPI:  Mr. Vallee is a 38 yo male with a history of HIV, DM2, HTN, tobacco abuse and chronic systolic heart failure.   Negative Hep B surface antigen and negative HCV AB; RPR non-reactive.   ECHO (12/08/2013): EF 15-20%, grade II DD, mild/mod LA, RV normal  Follow up for Heart Failure: Last visit increased coreg to 9.375 mg BID, however patient did not increase and is only taking 6.25 mg BID. He has been out of Imdur for at least at a week. BP is up this morning and reports he just got in an argument with boyfriend. Denies headaches. Reports feeling good. Denies SOB, PND, orthopnea or CP. Walking with a cane and can walk about 2 blocks before having to stop d/t R foot pain and not SOB. Not weighing d/t he has no scale. Taking medications which he reports he took this morning. Missed a few appointments here. Trying to follow a low salt diet and drink less than 2L a day.  ROS: All systems negative except as listed in HPI, PMH and Problem List.  SH:  History   Social History  . Marital Status: Single    Spouse Name: N/A    Number of Children: N/A  . Years of Education: N/A   Occupational History  . Not on file.   Social History Main Topics  . Smoking status: Current Every Day Smoker -- 0.25 packs/day    Types: Cigarettes  . Smokeless tobacco: Never Used     Comment: about 10 cigarettes a week  . Alcohol Use: Yes     Comment: socially about 6 pack per week   . Drug Use: Yes    Special: Marijuana  . Sexual Activity: Yes     Comment: pt. given condoms   Other Topics Concern  . Not on file   Social History Narrative   Currently homeless    FH:  Family History  Problem Relation Age of Onset  . Other Father   . Diabetes Mother   . Stroke Paternal Grandfather     Past Medical History  Diagnosis Date  . HIV  (human immunodeficiency virus infection)   . Diabetes mellitus without complication   . Hypertension   . Chronic systolic heart failure     a. EF 15-20%, grade II DD, LA mild/mod dilated  . DM2 (diabetes mellitus, type 2)     Current Outpatient Prescriptions  Medication Sig Dispense Refill  . carvedilol (COREG) 6.25 MG tablet Take 1.5 tablets (9.375 mg total) by mouth 2 (two) times daily with a meal. 180 tablet 3  . dolutegravir (TIVICAY) 50 MG tablet Take 1 tablet (50 mg total) by mouth daily. 30 tablet 5  . emtricitabine-tenofovir (TRUVADA) 200-300 MG per tablet Take 1 tablet by mouth daily. 30 tablet 5  . furosemide (LASIX) 40 MG tablet Take 40 mg by mouth. Take only on Mondays and Fridays    . glucose monitoring kit (FREESTYLE) monitoring kit 1 each by Does not apply route 4 (four) times daily - after meals and at bedtime. 1 month Diabetic Testing Supplies for QAC-QHS accuchecks. 1 each 1  . hydrALAZINE (APRESOLINE) 25 MG tablet Take 1 tablet (25 mg total) by mouth every 8 (eight) hours. 90 tablet 3  . isosorbide mononitrate (IMDUR) 30 MG 24 hr tablet Take 1 tablet (30 mg total)  by mouth daily. 30 tablet 3  . lisinopril (PRINIVIL,ZESTRIL) 10 MG tablet Take 1 tablet (10 mg total) by mouth daily. 30 tablet 3  . metFORMIN (GLUCOPHAGE) 500 MG tablet Take 1 tablet (500 mg total) by mouth 2 (two) times daily with a meal. 60 tablet 0   No current facility-administered medications for this encounter.    Filed Vitals:   03/09/14 1042  BP: 170/120  Pulse: 99  Weight: 194 lb 8 oz (88.225 kg)  SpO2: 99%    PHYSICAL EXAM: General:  Well appearing. No resp difficulty HEENT: normal Neck: supple. JVP 7; Carotids 2+ bilaterally; no bruits. No lymphadenopathy or thryomegaly appreciated. Cor: PMI normal. Regular rate & rhythm. No rubs, gallops or murmurs. Lungs: clear Abdomen: soft, nontender, nondistended. No hepatosplenomegaly. No bruits or masses. Good bowel sounds. Extremities: no  cyanosis, clubbing, rash, edema Neuro: alert & orientedx3, cranial nerves grossly intact. Moves all 4 extremities w/o difficulty. Affect pleasant.   ASSESSMENT & PLAN:  1) Chronic systolic HF: NICM?, EF 84-78%, grade II DD (11/2013) - NYHA II symptoms and volume status stable. Will continue lasix 40 mg on Mondays and Fridays. Check BMET and pro-BNP today.  - Increase coreg to 12.5 mg twice a day. Placed new label on current bottle and then sent new refill to pharmacy.  - Increase hydralazine to 50 mg TID and continue Imdur 30 mg daily. Placed new label on bottle and sent new refill for both medications to pharmacy.  - Continue lisinopril 10 mg daily.  - Will need repeat ECHO end of December and if EF remains less than 35% will need EP referral for ICD. QRS narrow, no CRT-D. Will work on getting orange card and Medicaid.  - Discussed the importance of the HF clinic and the need to weigh daily, follow a low sodium diet, drink less than 2L a day and follow up in the clinic.  2) HTN - Elevated. As above increase hydralazine and coreg.  3) HIV - Following with ID clinic 4) Social Issues - Currently homeless and living underneath an abandoned house. CSW met with patient today and got him appt for tomorrow to see CHW to see if he can get Medicaid/Orange card. Will provide transportation to and from appt.  5) Tobacco abuse: - Reports smoking about 4-5 cigarettes a week. Encouraged to try and abstain completely.  6) Alcohol abuse: - Discussed trying to abstain completely. Drinks about a 12pack on the weekends.    F/U 2 weeks.  [name] B NP-C 10:48 AM

## 2014-03-09 NOTE — Progress Notes (Signed)
CSW referred to assist with orange card application. Patient has pending disability application although currently has no insurance coverage. Patient has been seen at [facility] for primary care although has not applied for the orange card. CSW contacted financial counseling at [facility] and scheudled appointment for December 28 which was the first available appointment. Patient was also instructed to attempt application during walk in application times of Tuesdays after 2pm and Thursdays starting at 8:30am. Patient will cancel appointment if able to complete application during walk in times. Patient verbalizes understanding and will follow up with CSW if further needs arise. Lasandra Beech, LCSW [phone]

## 2014-03-09 NOTE — Patient Instructions (Signed)
Doing well.  Increase your Carvedilol (coreg) to 12.5 mg (2 tabs) twice a day. Finish your current bottle and then when you start taking the new bottle you will then take 12.5 mg (1 tablet) twice a day.  Increase your hydralazine to 50 mg (2 tablets) three times a day. Finish your current bottle and then when you start taking the new bottler you will then take 50 mg (1 tablet) three times a day.  Pick up your lisinopril and Imdur.  Wait for taxi at 1:00 tomorrow.  Follow up in 2 weeks. Call any issues.  Do the following things EVERYDAY: 1) Weigh yourself in the morning before breakfast. Write it down and keep it in a log. 2) Take your medicines as prescribed 3) Eat low salt foods-Limit salt (sodium) to 2000 mg per day.  4) Stay as active as you can everyday 5) Limit all fluids for the day to less than 2 liters 6)

## 2014-03-11 ENCOUNTER — Ambulatory Visit: Payer: Self-pay | Admitting: Infectious Diseases

## 2014-03-25 ENCOUNTER — Encounter (HOSPITAL_COMMUNITY): Payer: Self-pay

## 2014-03-25 ENCOUNTER — Ambulatory Visit (HOSPITAL_COMMUNITY)
Admission: RE | Admit: 2014-03-25 | Discharge: 2014-03-25 | Disposition: A | Discharge status: Home / Self Care | Payer: MEDICAID | Source: Ambulatory Visit | Attending: Cardiology | Admitting: Cardiology

## 2014-03-25 ENCOUNTER — Other Ambulatory Visit: Payer: Self-pay | Admitting: Internal Medicine

## 2014-03-25 VITALS — BP 148/88 | HR 98 | Resp 18 | Wt 205.0 lb

## 2014-03-25 DIAGNOSIS — Z79899 Other long term (current) drug therapy: Secondary | ICD-10-CM | POA: Insufficient documentation

## 2014-03-25 DIAGNOSIS — Z720 Tobacco use: Secondary | ICD-10-CM

## 2014-03-25 DIAGNOSIS — Z590 Homelessness: Secondary | ICD-10-CM | POA: Insufficient documentation

## 2014-03-25 DIAGNOSIS — F17200 Nicotine dependence, unspecified, uncomplicated: Secondary | ICD-10-CM

## 2014-03-25 DIAGNOSIS — B20 Human immunodeficiency virus [HIV] disease: Secondary | ICD-10-CM

## 2014-03-25 DIAGNOSIS — I5022 Chronic systolic (congestive) heart failure: Secondary | ICD-10-CM | POA: Insufficient documentation

## 2014-03-25 DIAGNOSIS — F17210 Nicotine dependence, cigarettes, uncomplicated: Secondary | ICD-10-CM | POA: Insufficient documentation

## 2014-03-25 DIAGNOSIS — Z21 Asymptomatic human immunodeficiency virus [HIV] infection status: Secondary | ICD-10-CM | POA: Insufficient documentation

## 2014-03-25 DIAGNOSIS — I10 Essential (primary) hypertension: Secondary | ICD-10-CM

## 2014-03-25 MED ORDER — HYDRALAZINE HCL 50 MG PO TABS: 50.0000 mg | ORAL_TABLET | Freq: Three times a day (TID) | ORAL | Status: DC

## 2014-03-25 MED ORDER — LOSARTAN POTASSIUM 25 MG PO TABS: 25.0000 mg | ORAL_TABLET | Freq: Every day | ORAL | Status: DC

## 2014-03-25 MED ORDER — FUROSEMIDE 40 MG PO TABS: ORAL_TABLET | ORAL | Status: DC

## 2014-03-25 MED ORDER — METFORMIN HCL 500 MG PO TABS: 500.0000 mg | ORAL_TABLET | Freq: Two times a day (BID) | ORAL | Status: DC

## 2014-03-25 MED ORDER — ISOSORBIDE MONONITRATE ER 30 MG PO TB24: 30.0000 mg | ORAL_TABLET | Freq: Every day | ORAL | Status: DC

## 2014-03-25 MED ORDER — CARVEDILOL 12.5 MG PO TABS: 12.5000 mg | ORAL_TABLET | Freq: Two times a day (BID) | ORAL | Status: DC

## 2014-03-25 NOTE — Progress Notes (Signed)
Patient ID: Shaun Brennan, male   DOB: [birth year], 38 y.o.   MRN: [id number]  PCP: Dr. Orpah Cobb [facility]) Primary Cardiologist: Dr. Gala Romney  HPI:  Shaun Brennan is a 38 yo male with a history of HIV, DM2, HTN, tobacco abuse and chronic systolic heart failure.   Negative Hep B surface antigen and negative HCV AB; RPR non-reactive.   ECHO (12/08/2013): EF 15-20%, grade II DD, mild/mod LA, RV normal  Follow up for Heart Failure: Last visit increased coreg 12.5 mg BID which he tolerated and increased hydralazine to 50 mg TID. Sometimes forgets some medications during the day. Denies SOB, orthopnea, PND or CP. In the morning having dry hacking cough for an hour. Able to walk about 2 blocks with cane before SOB. Not weighing daily, currently homeless. Trying to follow a low salt diet and drinking less than 2L a day.    ROS: All systems negative except as listed in HPI, PMH and Problem List.  SH:  History   Social History  . Marital Status: Single    Spouse Name: N/A    Number of Children: N/A  . Years of Education: N/A   Occupational History  . Not on file.   Social History Main Topics  . Smoking status: Current Every Day Smoker -- 0.25 packs/day    Types: Cigarettes  . Smokeless tobacco: Never Used     Comment: about 10 cigarettes a week  . Alcohol Use: Yes     Comment: socially about 6 pack per week   . Drug Use: Yes    Special: Marijuana  . Sexual Activity: Yes     Comment: pt. given condoms   Other Topics Concern  . Not on file   Social History Narrative   Currently homeless    FH:  Family History  Problem Relation Age of Onset  . Other Father   . Diabetes Mother   . Stroke Paternal Grandfather     Past Medical History  Diagnosis Date  . HIV (human immunodeficiency virus infection)   . Diabetes mellitus without complication   . Hypertension   . Chronic systolic heart failure     a. EF 15-20%, grade II DD, LA mild/mod dilated  . DM2  (diabetes mellitus, type 2)     Current Outpatient Prescriptions  Medication Sig Dispense Refill  . carvedilol (COREG) 12.5 MG tablet Take 1 tablet (12.5 mg total) by mouth 2 (two) times daily with a meal. 60 tablet 3  . dolutegravir (TIVICAY) 50 MG tablet Take 1 tablet (50 mg total) by mouth daily. 30 tablet 5  . emtricitabine-tenofovir (TRUVADA) 200-300 MG per tablet Take 1 tablet by mouth daily. 30 tablet 5  . furosemide (LASIX) 40 MG tablet Take 40 mg by mouth. Take only on Mondays and Fridays    . glucose monitoring kit (FREESTYLE) monitoring kit 1 each by Does not apply route 4 (four) times daily - after meals and at bedtime. 1 month Diabetic Testing Supplies for QAC-QHS accuchecks. 1 each 1  . hydrALAZINE (APRESOLINE) 50 MG tablet Take 1 tablet (50 mg total) by mouth every 8 (eight) hours. 90 tablet 3  . isosorbide mononitrate (IMDUR) 30 MG 24 hr tablet Take 1 tablet (30 mg total) by mouth daily. 30 tablet 3  . lisinopril (PRINIVIL,ZESTRIL) 10 MG tablet Take 1 tablet (10 mg total) by mouth daily. 30 tablet 3  . metFORMIN (GLUCOPHAGE) 500 MG tablet Take 1 tablet (500 mg total) by mouth 2 (two) times  daily with a meal. 60 tablet 0   No current facility-administered medications for this encounter.    Filed Vitals:   03/25/14 1150  BP: 148/88  Pulse: 98  Resp: 18  Weight: 205 lb (92.987 kg)  SpO2: 98%    PHYSICAL EXAM: General:  Well appearing. No resp difficulty HEENT: normal Neck: supple. JVP 7-8; Carotids 2+ bilaterally; no bruits. No lymphadenopathy or thryomegaly appreciated. Cor: PMI normal. Regular rate & rhythm. No rubs, gallops or murmurs. Lungs: clear Abdomen: soft, nontender, nondistended. No hepatosplenomegaly. No bruits or masses. Good bowel sounds. Extremities: no cyanosis, clubbing, rash, edema Neuro: alert & orientedx3, cranial nerves grossly intact. Moves all 4 extremities w/o difficulty. Affect pleasant.   ASSESSMENT & PLAN:  1) Chronic systolic HF: NICM?,  EF 23-55%, grade II DD (11/2013) - NYHA II symptoms and volume status stable. Will continue lasix 40 mg on Mondays and Fridays.  - Continue coreg 12.5 mg BID and placed new label on bottle. Will send new Rx for 12.5 mg tablets.  - Continue hydralazine and Imdur at current doses will not titrate currently because I do not think he has been truly taking everything correctly. Lengthy discussion with patient about medication compliance.  - Will stop lisinopril with continued cough and start losartan 50 mg daily.  - Check BMET next Tuesday.  - Will need ECHO next visit and if EF remains less than 35% will need EP referral for ICD. QRS narrow, no CRT-D. Will work on getting orange card and Medicaid will need this before referral.  - Discussed the importance of the HF clinic and the need to weigh daily, follow a low sodium diet, drink less than 2L a day and follow up in the clinic.  2) HTN - Elevated. As above not sure if patient taking medications consistently and did not yet take this am. Switch lisinopril for losartan and continue other medications.  3) HIV - Following with ID clinic 4) Social Issues - Currently homeless and living underneath an abandoned house. We are providing transportation to and from clinic appts. Going to [place] next week before getting labs to apply for orange card and hopefully Medicaid.  5) Tobacco abuse: - Reports smoking about 4-5 cigarettes a week. Encouraged to try and abstain completely.  6) Alcohol abuse: - Discussed trying to abstain completely. Still drinking about a 12 pack on the weekends.    F/U 1 month with Echo.  [name] B NP-C 12:00 PM

## 2014-03-25 NOTE — Patient Instructions (Signed)
Doing well.  Take medications as prescribed.   Start losartan 25 mg daily.  Follow up next Tuesday for labs.  Follow up 1 month with Echo.  Do the following things EVERYDAY: 1) Weigh yourself in the morning before breakfast. Write it down and keep it in a log. 2) Take your medicines as prescribed 3) Eat low salt foods-Limit salt (sodium) to 2000 mg per day.  4) Stay as active as you can everyday 5) Limit all fluids for the day to less than 2 liters 6)

## 2014-03-26 ENCOUNTER — Encounter (HOSPITAL_COMMUNITY): Payer: Self-pay

## 2014-03-31 ENCOUNTER — Ambulatory Visit (HOSPITAL_COMMUNITY)
Admission: RE | Admit: 2014-03-31 | Discharge: 2014-03-31 | Disposition: A | Discharge status: Home / Self Care | Payer: Self-pay | Source: Ambulatory Visit | Attending: Internal Medicine | Admitting: Internal Medicine

## 2014-03-31 DIAGNOSIS — I5022 Chronic systolic (congestive) heart failure: Secondary | ICD-10-CM

## 2014-03-31 LAB — BASIC METABOLIC PANEL
Anion gap: 9 (ref 5–15)
BUN: 8 mg/dL (ref 6–23)
CALCIUM: 9.4 mg/dL (ref 8.4–10.5)
CO2: 27 mmol/L (ref 19–32)
CREATININE: 1.1 mg/dL (ref 0.50–1.35)
Chloride: 98 mEq/L (ref 96–112)
GFR calc Af Amer: >90 mL/min (ref >90)
GFR, EST NON AFRICAN AMERICAN: 84 mL/min — AB (ref >90)
GLUCOSE: 238 mg/dL — AB (ref 70–99)
Potassium: 4.1 mmol/L (ref 3.5–5.1)
Sodium: 134 mmol/L — ABNORMAL LOW (ref 135–145)

## 2014-04-06 ENCOUNTER — Ambulatory Visit: Payer: Self-pay

## 2014-04-06 ENCOUNTER — Ambulatory Visit: Payer: Self-pay | Admitting: Internal Medicine

## 2014-04-27 ENCOUNTER — Ambulatory Visit (HOSPITAL_BASED_OUTPATIENT_CLINIC_OR_DEPARTMENT_OTHER)
Admission: RE | Admit: 2014-04-27 | Discharge: 2014-04-27 | Disposition: A | Discharge status: Home / Self Care | Payer: Self-pay | Source: Ambulatory Visit | Attending: Internal Medicine | Admitting: Internal Medicine

## 2014-04-27 ENCOUNTER — Encounter: Payer: Self-pay | Admitting: Licensed Clinical Social Worker

## 2014-04-27 ENCOUNTER — Ambulatory Visit (HOSPITAL_COMMUNITY)
Admission: RE | Admit: 2014-04-27 | Discharge: 2014-04-27 | Disposition: A | Discharge status: Home / Self Care | Payer: Self-pay | Source: Ambulatory Visit | Attending: Internal Medicine | Admitting: Internal Medicine

## 2014-04-27 VITALS — BP 112/68 | HR 68 | Wt 202.0 lb

## 2014-04-27 DIAGNOSIS — F1010 Alcohol abuse, uncomplicated: Secondary | ICD-10-CM | POA: Insufficient documentation

## 2014-04-27 DIAGNOSIS — I5022 Chronic systolic (congestive) heart failure: Secondary | ICD-10-CM | POA: Insufficient documentation

## 2014-04-27 DIAGNOSIS — E119 Type 2 diabetes mellitus without complications: Secondary | ICD-10-CM | POA: Insufficient documentation

## 2014-04-27 DIAGNOSIS — Z590 Homelessness: Secondary | ICD-10-CM | POA: Insufficient documentation

## 2014-04-27 DIAGNOSIS — I517 Cardiomegaly: Secondary | ICD-10-CM

## 2014-04-27 DIAGNOSIS — F17210 Nicotine dependence, cigarettes, uncomplicated: Secondary | ICD-10-CM | POA: Insufficient documentation

## 2014-04-27 DIAGNOSIS — Z79899 Other long term (current) drug therapy: Secondary | ICD-10-CM | POA: Insufficient documentation

## 2014-04-27 DIAGNOSIS — I10 Essential (primary) hypertension: Secondary | ICD-10-CM | POA: Insufficient documentation

## 2014-04-27 DIAGNOSIS — Z21 Asymptomatic human immunodeficiency virus [HIV] infection status: Secondary | ICD-10-CM | POA: Insufficient documentation

## 2014-04-27 MED ORDER — LOSARTAN POTASSIUM 50 MG PO TABS: 50.0000 mg | ORAL_TABLET | Freq: Every day | ORAL | Status: DC

## 2014-04-27 NOTE — Progress Notes (Signed)
Patient ID: Shaun Brennan, male   DOB: March 28, 1976, 39 y.o.   MRN: [id number]  PCP: Dr. Annitta Needs [facility]) Primary Cardiologist: Dr. Haroldine Laws  HPI: Shaun Brennan is a 38 yo male with a history of HIV, DM2, HTN, tobacco abuse and chronic systolic heart failure.   Negative Hep B surface antigen and negative HCV AB; RPR non-reactive.   ECHO (12/08/2013): EF 15-20%, grade II DD, mild/mod LA, RV normal ECHO 04/27/2014 EF 25-30% RV mildy HK. Not seen well.   Follow up for Heart Failure: Last visit switched to losartan 50 mg daily due to cough noted with ACE. Overall feeling pretty good. Denies SOB/PND/Orthopnea. Taking all medicines. Misses medications about 1-2 times per week. Drinking a 6 pack of beer on the weekend. Smoking 2-3 a day. Not weighing daily, currently homeless. Trying to follow a low salt diet and drinking less than 2L a day. Living in an abandoned house. Has bus passes to get to appointments.    Labs 03/31/2014 K 4.1 Creatinine 1.10   ROS: All systems negative except as listed in HPI, PMH and Problem List.  SH:  History   Social History  . Marital Status: Single    Spouse Name: N/A    Number of Children: N/A  . Years of Education: N/A   Occupational History  . Not on file.   Social History Main Topics  . Smoking status: Current Every Day Smoker -- 0.25 packs/day    Types: Cigarettes  . Smokeless tobacco: Never Used     Comment: about 10 cigarettes a week  . Alcohol Use: Yes     Comment: socially about 6 pack per week   . Drug Use: Yes    Special: Marijuana  . Sexual Activity: Yes     Comment: pt. given condoms   Other Topics Concern  . Not on file   Social History Narrative   Currently homeless    FH:  Family History  Problem Relation Age of Onset  . Other Father   . Diabetes Mother   . Stroke Paternal Grandfather     Past Medical History  Diagnosis Date  . HIV (human immunodeficiency virus infection)   . Diabetes mellitus  without complication   . Hypertension   . Chronic systolic heart failure     a. EF 15-20%, grade II DD, LA mild/mod dilated  . DM2 (diabetes mellitus, type 2)     Current Outpatient Prescriptions  Medication Sig Dispense Refill  . carvedilol (COREG) 12.5 MG tablet Take 1 tablet (12.5 mg total) by mouth 2 (two) times daily with a meal. 60 tablet 3  . dolutegravir (TIVICAY) 50 MG tablet Take 1 tablet (50 mg total) by mouth daily. 30 tablet 5  . emtricitabine-tenofovir (TRUVADA) 200-300 MG per tablet Take 1 tablet by mouth daily. 30 tablet 5  . furosemide (LASIX) 40 MG tablet Take only on Mondays and Fridays 12 tablet 2  . glucose monitoring kit (FREESTYLE) monitoring kit 1 each by Does not apply route 4 (four) times daily - after meals and at bedtime. 1 month Diabetic Testing Supplies for QAC-QHS accuchecks. 1 each 1  . hydrALAZINE (APRESOLINE) 50 MG tablet Take 1 tablet (50 mg total) by mouth every 8 (eight) hours. 90 tablet 3  . isosorbide mononitrate (IMDUR) 30 MG 24 hr tablet Take 1 tablet (30 mg total) by mouth daily. 30 tablet 3  . losartan (COZAAR) 25 MG tablet Take 1 tablet (25 mg total) by mouth daily. 30 tablet  2  . metFORMIN (GLUCOPHAGE) 500 MG tablet Take 1 tablet (500 mg total) by mouth 2 (two) times daily with a meal. 60 tablet 0   No current facility-administered medications for this encounter.    Filed Vitals:   04/27/14 1214  BP: 112/68  Pulse: 68  Weight: 202 lb (91.627 kg)  SpO2: 96%    PHYSICAL EXAM: General:  Well appearing. No resp difficulty HEENT: normal Neck: supple. JVP 5-6 ; Carotids 2+ bilaterally; no bruits. No lymphadenopathy or thryomegaly appreciated. Cor: PMI normal. Regular rate & rhythm. No rubs, gallops or murmurs. Lungs: clear Abdomen: soft, nontender, nondistended. No hepatosplenomegaly. No bruits or masses. Good bowel sounds. Extremities: no cyanosis, clubbing, rash, edema Neuro: alert & orientedx3, cranial nerves grossly intact. Moves all 4  extremities w/o difficulty. Affect pleasant.   ASSESSMENT & PLAN:  1) Chronic systolic HF: NICM?, EF 35-68%, grade II DD (11/2013). Today EF 25-30% RV mild HK. - NYHA II symptoms and volume status stable. Will continue lasix 40 mg on Mondays and Fridays.  - Continue coreg 12.5 mg BID  -  Continue hydralazine 50 mg tid and Imdur 30 mg daily.  - No Ace due to cough.  -Increase  losartan 50 mg twice a day.   - ECHO today EF getting better 25-30%. Has no insurance therefore can not get medicaid. Continue to titrate HF meds.  - Discussed the importance of the HF clinic and the need to weigh daily, follow a low sodium diet, drink less than 2L a day and follow up in the clinic.  2) HTN - OK. As above increase losartan.  3) HIV - Following with ID clinic 4) Social Issues - Currently homeless and living in an abandoned house.  5) Tobacco abuse: - Reports smoking about 4-5 cigarettes a week. Encouraged to try and abstain completely.  6) Alcohol abuse: - Discussed trying to abstain completely. Still drinking about a 6 pack on the weekends.    Follow up 4 weeks. Check BMET in 10 days.  Shaun Brennan,Shaun Brennan 12:35 PM   Patient seen and examined with Shaun Grinder, NP. We discussed all aspects of the encounter. I agree with the assessment and plan as stated above.   Echo reviewed personally EF 25-30%. Volume status looks ok. Agree with titrating losartan. Has no insurance - unable to afford ICD.   Daniel Bensimhon,MD 1:27 PM

## 2014-04-27 NOTE — Patient Instructions (Signed)
Increase Losartan to 50 mg daily  Your physician recommends that you schedule a follow-up appointment in: 2 weeks

## 2014-04-27 NOTE — Progress Notes (Signed)
CSW referred to assist patient with community resources. Patient states the "shelter is full but can get help when the cold nights hit". Patient reports he has a pending SSI and medicaid application and has assistance with process through [facility]) which also provides an address for applications. Patient reports he has not followed up with ID/HIV clinic to services. Patient reports that he has a Veterinary surgeon on [employer] and provided CSW with name and number. CSW will explore options and contact counselor for additional resources and return call to patient tomorrow. Lasandra Beech, LCSW [phone]

## 2014-04-27 NOTE — Progress Notes (Signed)
*  PRELIMINARY RESULTS* Echocardiogram 2D Echocardiogram has been performed.  Shaun Brennan 04/27/2014, 12:12 PM

## 2014-05-04 ENCOUNTER — Ambulatory Visit: Payer: Self-pay

## 2014-05-12 ENCOUNTER — Encounter (HOSPITAL_COMMUNITY): Payer: Self-pay

## 2014-07-01 ENCOUNTER — Telehealth: Payer: Self-pay | Admitting: Internal Medicine

## 2014-07-01 NOTE — Telephone Encounter (Signed)
Patient called to request a med refill for all of his current medications, patient would like to be contacted at [phone] and asked for nurse to LVM. Please f/u with pt.

## 2014-07-07 ENCOUNTER — Inpatient Hospital Stay (HOSPITAL_COMMUNITY)
Admission: EM | Admit: 2014-07-07 | Discharge: 2014-07-15 | DRG: 975 | Disposition: A | Discharge status: Home / Self Care | Payer: Self-pay | Attending: Internal Medicine | Admitting: Internal Medicine

## 2014-07-07 ENCOUNTER — Encounter (HOSPITAL_COMMUNITY): Payer: Self-pay | Admitting: *Deleted

## 2014-07-07 ENCOUNTER — Emergency Department (HOSPITAL_COMMUNITY): Payer: Self-pay

## 2014-07-07 DIAGNOSIS — R45851 Suicidal ideations: Secondary | ICD-10-CM

## 2014-07-07 DIAGNOSIS — E119 Type 2 diabetes mellitus without complications: Secondary | ICD-10-CM

## 2014-07-07 DIAGNOSIS — J180 Bronchopneumonia, unspecified organism: Secondary | ICD-10-CM

## 2014-07-07 DIAGNOSIS — I5020 Unspecified systolic (congestive) heart failure: Secondary | ICD-10-CM

## 2014-07-07 DIAGNOSIS — Z833 Family history of diabetes mellitus: Secondary | ICD-10-CM

## 2014-07-07 DIAGNOSIS — Z21 Asymptomatic human immunodeficiency virus [HIV] infection status: Secondary | ICD-10-CM

## 2014-07-07 DIAGNOSIS — Z590 Homelessness: Secondary | ICD-10-CM

## 2014-07-07 DIAGNOSIS — I472 Ventricular tachycardia: Secondary | ICD-10-CM | POA: Diagnosis not present

## 2014-07-07 DIAGNOSIS — I5022 Chronic systolic (congestive) heart failure: Secondary | ICD-10-CM | POA: Diagnosis present

## 2014-07-07 DIAGNOSIS — B20 Human immunodeficiency virus [HIV] disease: Secondary | ICD-10-CM | POA: Diagnosis present

## 2014-07-07 DIAGNOSIS — Z888 Allergy status to other drugs, medicaments and biological substances status: Secondary | ICD-10-CM

## 2014-07-07 DIAGNOSIS — J80 Acute respiratory distress syndrome: Secondary | ICD-10-CM

## 2014-07-07 DIAGNOSIS — F321 Major depressive disorder, single episode, moderate: Secondary | ICD-10-CM | POA: Diagnosis present

## 2014-07-07 DIAGNOSIS — F1290 Cannabis use, unspecified, uncomplicated: Secondary | ICD-10-CM | POA: Diagnosis present

## 2014-07-07 DIAGNOSIS — I420 Dilated cardiomyopathy: Secondary | ICD-10-CM

## 2014-07-07 DIAGNOSIS — M549 Dorsalgia, unspecified: Secondary | ICD-10-CM | POA: Diagnosis present

## 2014-07-07 DIAGNOSIS — Z79899 Other long term (current) drug therapy: Secondary | ICD-10-CM

## 2014-07-07 DIAGNOSIS — F329 Major depressive disorder, single episode, unspecified: Secondary | ICD-10-CM

## 2014-07-07 DIAGNOSIS — F1010 Alcohol abuse, uncomplicated: Secondary | ICD-10-CM | POA: Diagnosis present

## 2014-07-07 DIAGNOSIS — I10 Essential (primary) hypertension: Secondary | ICD-10-CM

## 2014-07-07 DIAGNOSIS — J181 Lobar pneumonia, unspecified organism: Principal | ICD-10-CM | POA: Diagnosis present

## 2014-07-07 DIAGNOSIS — J189 Pneumonia, unspecified organism: Secondary | ICD-10-CM | POA: Diagnosis present

## 2014-07-07 DIAGNOSIS — Z9114 Patient's other noncompliance with medication regimen: Secondary | ICD-10-CM | POA: Diagnosis present

## 2014-07-07 DIAGNOSIS — F17210 Nicotine dependence, cigarettes, uncomplicated: Secondary | ICD-10-CM | POA: Diagnosis present

## 2014-07-07 LAB — BASIC METABOLIC PANEL
ANION GAP: 8 (ref 5–15)
BUN: 9 mg/dL (ref 6–23)
CALCIUM: 8.5 mg/dL (ref 8.4–10.5)
CO2: 29 mmol/L (ref 19–32)
CREATININE: 1.08 mg/dL (ref 0.50–1.35)
Chloride: 92 mmol/L — ABNORMAL LOW (ref 96–112)
GFR calc non Af Amer: 86 mL/min — ABNORMAL LOW (ref >90)
GLUCOSE: 327 mg/dL — AB (ref 70–99)
POTASSIUM: 4.6 mmol/L (ref 3.5–5.1)
Sodium: 129 mmol/L — ABNORMAL LOW (ref 135–145)

## 2014-07-07 LAB — HEPATIC FUNCTION PANEL
ALBUMIN: 3.3 g/dL — AB (ref 3.5–5.2)
ALT: 46 U/L (ref 0–53)
AST: 46 U/L — ABNORMAL HIGH (ref 0–37)
Alkaline Phosphatase: 100 U/L (ref 39–117)
BILIRUBIN DIRECT: 0.1 mg/dL (ref 0.0–0.5)
Indirect Bilirubin: 0.6 mg/dL (ref 0.3–0.9)
TOTAL PROTEIN: 7.9 g/dL (ref 6.0–8.3)
Total Bilirubin: 0.7 mg/dL (ref 0.3–1.2)

## 2014-07-07 LAB — CBC
HCT: 43.4 % (ref 39.0–52.0)
HEMOGLOBIN: 15.1 g/dL (ref 13.0–17.0)
MCH: 33.6 pg (ref 26.0–34.0)
MCHC: 34.8 g/dL (ref 30.0–36.0)
MCV: 96.7 fL (ref 78.0–100.0)
Platelets: 124 10*3/uL — ABNORMAL LOW (ref 150–400)
RBC: 4.49 MIL/uL (ref 4.22–5.81)
RDW: 13.4 % (ref 11.5–15.5)
WBC: 8.7 10*3/uL (ref 4.0–10.5)

## 2014-07-07 LAB — I-STAT ARTERIAL BLOOD GAS, ED
ACID-BASE EXCESS: 3 mmol/L — AB (ref 0.0–2.0)
Bicarbonate: 26.4 mEq/L — ABNORMAL HIGH (ref 20.0–24.0)
O2 Saturation: 96 %
Patient temperature: 98.6
TCO2: 28 mmol/L (ref 0–100)
pCO2 arterial: 36.2 mmHg (ref 35.0–45.0)
pH, Arterial: 7.471 — ABNORMAL HIGH (ref 7.350–7.450)
pO2, Arterial: 76 mmHg — ABNORMAL LOW (ref 80.0–100.0)

## 2014-07-07 LAB — I-STAT CHEM 8, ED
BUN: 9 mg/dL (ref 6–23)
CALCIUM ION: 1.06 mmol/L — AB (ref 1.12–1.23)
CHLORIDE: 93 mmol/L — AB (ref 96–112)
Creatinine, Ser: 0.8 mg/dL (ref 0.50–1.35)
GLUCOSE: 299 mg/dL — AB (ref 70–99)
HCT: 46 % (ref 39.0–52.0)
Hemoglobin: 15.6 g/dL (ref 13.0–17.0)
Potassium: 5 mmol/L (ref 3.5–5.1)
Sodium: 130 mmol/L — ABNORMAL LOW (ref 135–145)
TCO2: 24 mmol/L (ref 0–100)

## 2014-07-07 LAB — LACTATE DEHYDROGENASE: LDH: 389 U/L — ABNORMAL HIGH (ref 94–250)

## 2014-07-07 LAB — BRAIN NATRIURETIC PEPTIDE: B Natriuretic Peptide: 224.4 pg/mL — ABNORMAL HIGH (ref 0.0–100.0)

## 2014-07-07 LAB — MRSA PCR SCREENING: MRSA BY PCR: NEGATIVE

## 2014-07-07 LAB — I-STAT TROPONIN, ED: Troponin i, poc: 0.03 ng/mL (ref 0.00–0.08)

## 2014-07-07 LAB — GLUCOSE, CAPILLARY: Glucose-Capillary: 332 mg/dL — ABNORMAL HIGH (ref 70–99)

## 2014-07-07 MED ORDER — INSULIN DETEMIR 100 UNIT/ML ~~LOC~~ SOLN
15.0000 [IU] | Freq: Every day | SUBCUTANEOUS | Status: DC
  Administered 2014-07-07 – 2014-07-08 (×2): 15 [IU] via SUBCUTANEOUS
  Filled 2014-07-07 (×3): qty 0.15

## 2014-07-07 MED ORDER — LOSARTAN POTASSIUM 50 MG PO TABS
50.0000 mg | ORAL_TABLET | Freq: Every day | ORAL | Status: DC
  Administered 2014-07-08: 50 mg via ORAL
  Filled 2014-07-07: qty 1

## 2014-07-07 MED ORDER — SODIUM CHLORIDE 0.9 % IV SOLN: INTRAVENOUS | Status: DC

## 2014-07-07 MED ORDER — CARVEDILOL 12.5 MG PO TABS
12.5000 mg | ORAL_TABLET | Freq: Two times a day (BID) | ORAL | Status: DC
  Filled 2014-07-07 (×2): qty 1

## 2014-07-07 MED ORDER — HYDRALAZINE HCL 50 MG PO TABS
50.0000 mg | ORAL_TABLET | Freq: Three times a day (TID) | ORAL | Status: DC
  Administered 2014-07-07 – 2014-07-15 (×24): 50 mg via ORAL
  Filled 2014-07-07 (×26): qty 1

## 2014-07-07 MED ORDER — DEXTROSE 5 % IV SOLN
500.0000 mg | INTRAVENOUS | Status: DC
  Administered 2014-07-07 – 2014-07-08 (×2): 500 mg via INTRAVENOUS
  Filled 2014-07-07 (×3): qty 500

## 2014-07-07 MED ORDER — ISOSORBIDE MONONITRATE ER 30 MG PO TB24
30.0000 mg | ORAL_TABLET | Freq: Every day | ORAL | Status: DC
  Administered 2014-07-08 – 2014-07-15 (×8): 30 mg via ORAL
  Filled 2014-07-07 (×8): qty 1

## 2014-07-07 MED ORDER — DOLUTEGRAVIR SODIUM 50 MG PO TABS
50.0000 mg | ORAL_TABLET | Freq: Every day | ORAL | Status: DC
  Administered 2014-07-08 – 2014-07-15 (×8): 50 mg via ORAL
  Filled 2014-07-07 (×8): qty 1

## 2014-07-07 MED ORDER — PIPERACILLIN-TAZOBACTAM 3.375 G IVPB 30 MIN
3.3750 g | Freq: Once | INTRAVENOUS | Status: AC
  Administered 2014-07-07: 3.375 g via INTRAVENOUS
  Filled 2014-07-07: qty 50

## 2014-07-07 MED ORDER — FUROSEMIDE 40 MG PO TABS
40.0000 mg | ORAL_TABLET | Freq: Every day | ORAL | Status: DC
  Administered 2014-07-07 – 2014-07-08 (×2): 40 mg via ORAL
  Filled 2014-07-07 (×2): qty 1

## 2014-07-07 MED ORDER — INSULIN ASPART 100 UNIT/ML ~~LOC~~ SOLN
0.0000 [IU] | Freq: Three times a day (TID) | SUBCUTANEOUS | Status: DC
  Administered 2014-07-08: 2 [IU] via SUBCUTANEOUS
  Administered 2014-07-08: 1 [IU] via SUBCUTANEOUS
  Administered 2014-07-08: 5 [IU] via SUBCUTANEOUS
  Administered 2014-07-09: 1 [IU] via SUBCUTANEOUS
  Administered 2014-07-09: 7 [IU] via SUBCUTANEOUS
  Administered 2014-07-09: 3 [IU] via SUBCUTANEOUS
  Administered 2014-07-10: 1 [IU] via SUBCUTANEOUS
  Administered 2014-07-10: 3 [IU] via SUBCUTANEOUS
  Administered 2014-07-10: 5 [IU] via SUBCUTANEOUS
  Administered 2014-07-11: 7 [IU] via SUBCUTANEOUS
  Administered 2014-07-11: 2 [IU] via SUBCUTANEOUS
  Administered 2014-07-11: 5 [IU] via SUBCUTANEOUS
  Administered 2014-07-12: 1 [IU] via SUBCUTANEOUS

## 2014-07-07 MED ORDER — VANCOMYCIN HCL IN DEXTROSE 1-5 GM/200ML-% IV SOLN
1000.0000 mg | Freq: Once | INTRAVENOUS | Status: AC
  Administered 2014-07-07: 1000 mg via INTRAVENOUS
  Filled 2014-07-07: qty 200

## 2014-07-07 MED ORDER — OXYCODONE HCL 5 MG PO TABS: 5.0000 mg | ORAL_TABLET | Freq: Once | ORAL | Status: DC

## 2014-07-07 MED ORDER — EMTRICITABINE-TENOFOVIR DF 200-300 MG PO TABS
1.0000 | ORAL_TABLET | Freq: Every day | ORAL | Status: DC
  Administered 2014-07-08 – 2014-07-15 (×8): 1 via ORAL
  Filled 2014-07-07 (×8): qty 1

## 2014-07-07 MED ORDER — ENOXAPARIN SODIUM 40 MG/0.4ML ~~LOC~~ SOLN
40.0000 mg | SUBCUTANEOUS | Status: DC
Start: 2014-07-07 — End: 2014-07-15
  Administered 2014-07-07 – 2014-07-14 (×8): 40 mg via SUBCUTANEOUS
  Filled 2014-07-07 (×10): qty 0.4

## 2014-07-07 MED ORDER — CEFTRIAXONE SODIUM IN DEXTROSE 20 MG/ML IV SOLN
1.0000 g | INTRAVENOUS | Status: DC
  Filled 2014-07-07: qty 50

## 2014-07-07 MED ORDER — CEFTRIAXONE SODIUM IN DEXTROSE 20 MG/ML IV SOLN
1.0000 g | INTRAVENOUS | Status: DC
  Administered 2014-07-07 – 2014-07-09 (×4): 1 g via INTRAVENOUS
  Filled 2014-07-07 (×5): qty 50

## 2014-07-07 MED ORDER — OXYCODONE HCL 5 MG PO TABS
5.0000 mg | ORAL_TABLET | Freq: Once | ORAL | Status: AC
Start: 2014-07-07 — End: 2014-07-07
  Administered 2014-07-07: 5 mg via ORAL
  Filled 2014-07-07: qty 1

## 2014-07-07 NOTE — ED Notes (Signed)
Meal tray ordered 

## 2014-07-07 NOTE — ED Notes (Signed)
Took all pt's belongings to [facility]

## 2014-07-07 NOTE — ED Notes (Signed)
Patient states he has been coughing up blood this am.  Patient states he started feeling bad last night.  When he lays down flat he, "I cannot catch my breath".

## 2014-07-07 NOTE — ED Notes (Signed)
Ordered regular dinner tray. Spoke with Eber Jones

## 2014-07-07 NOTE — ED Notes (Signed)
Pt monitored by pulse ox, bp cuff, and 5-lead. 

## 2014-07-07 NOTE — ED Notes (Signed)
Phlebotomy at bedside.

## 2014-07-07 NOTE — ED Notes (Signed)
Pt reports sob, recent productive cough and now mid chest pains. ekg done at triage. spo2 88%.

## 2014-07-07 NOTE — ED Notes (Signed)
Paged IN MED to [facility] at [phone]

## 2014-07-07 NOTE — ED Provider Notes (Signed)
CSN: [id number]     Arrival date & time 07/07/14  1214 History   First MD Initiated Contact with Patient 07/07/14 1313     Chief Complaint  Patient presents with  . Chest Pain  . Shortness of Breath     (Consider location/radiation/quality/duration/timing/severity/associated sxs/prior Treatment) Patient is a 39 y.o. male presenting with shortness of breath. The history is provided by the patient (pt complains of sob and cough).  Shortness of Breath Severity:  Moderate Onset quality:  Sudden Timing:  Constant Progression:  Worsening Chronicity:  New Context: activity   Associated symptoms: cough   Associated symptoms: no abdominal pain, no chest pain, no headaches and no rash     Past Medical History  Diagnosis Date  . HIV (human immunodeficiency virus infection)   . Diabetes mellitus without complication   . Hypertension   . Chronic systolic heart failure     a. EF 15-20%, grade II DD, LA mild/mod dilated  . DM2 (diabetes mellitus, type 2)    Past Surgical History  Procedure Laterality Date  . Ankle surgery Right    Family History  Problem Relation Age of Onset  . Other Father   . Diabetes Mother   . Stroke Paternal Grandfather    History  Substance Use Topics  . Smoking status: Current Every Day Smoker -- 0.25 packs/day    Types: Cigarettes  . Smokeless tobacco: Never Used     Comment: about 10 cigarettes a week  . Alcohol Use: Yes     Comment: socially about 6 pack per week     Review of Systems  Constitutional: Negative for appetite change and fatigue.  HENT: Negative for congestion, ear discharge and sinus pressure.   Eyes: Negative for discharge.  Respiratory: Positive for cough and shortness of breath.   Cardiovascular: Negative for chest pain.  Gastrointestinal: Negative for abdominal pain and diarrhea.  Genitourinary: Negative for frequency and hematuria.  Musculoskeletal: Negative for back pain.  Skin: Negative for rash.  Neurological: Negative  for seizures and headaches.  Psychiatric/Behavioral: Negative for hallucinations.      Allergies  Lisinopril  Home Medications   Prior to Admission medications   Medication Sig Start Date End Date Taking? Authorizing Provider  carvedilol (COREG) 12.5 MG tablet Take 1 tablet (12.5 mg total) by mouth 2 (two) times daily with a meal. 03/25/14  Yes Rande Brunt, NP  dolutegravir (TIVICAY) 50 MG tablet Take 1 tablet (50 mg total) by mouth daily. 02/04/14  Yes Thayer Headings, MD  emtricitabine-tenofovir (TRUVADA) 200-300 MG per tablet Take 1 tablet by mouth daily. 02/04/14  Yes Thayer Headings, MD  furosemide (LASIX) 40 MG tablet Take only on Mondays and Fridays 03/25/14  Yes Rande Brunt, NP  glucose monitoring kit (FREESTYLE) monitoring kit 1 each by Does not apply route 4 (four) times daily - after meals and at bedtime. 1 month Diabetic Testing Supplies for QAC-QHS accuchecks. 12/12/13  Yes Lorayne Marek, MD  hydrALAZINE (APRESOLINE) 50 MG tablet Take 1 tablet (50 mg total) by mouth every 8 (eight) hours. 03/25/14  Yes Rande Brunt, NP  isosorbide mononitrate (IMDUR) 30 MG 24 hr tablet Take 1 tablet (30 mg total) by mouth daily. 03/25/14  Yes Rande Brunt, NP  losartan (COZAAR) 50 MG tablet Take 1 tablet (50 mg total) by mouth daily. 04/27/14  Yes Jolaine Artist, MD  metFORMIN (GLUCOPHAGE) 500 MG tablet Take 1 tablet (500 mg total) by mouth 2 (two) times daily  with a meal. 03/25/14  Yes Deepak Advani, MD   BP 162/114 mmHg  Pulse 122  Temp(Src) 99.7 F (37.6 C) (Oral)  Resp 26  Ht $R'5\' 5"'ZI$  (1.651 m)  SpO2 93% Physical Exam  Constitutional: He is oriented to person, place, and time. He appears well-developed.  HENT:  Head: Normocephalic.  Eyes: Conjunctivae and EOM are normal. No scleral icterus.  Neck: Neck supple. No thyromegaly present.  Cardiovascular: Regular rhythm.  Exam reveals no gallop and no friction rub.   No murmur heard. tacycardia  Pulmonary/Chest: No stridor. He  has no wheezes. He has rales. He exhibits no tenderness.  Abdominal: He exhibits no distension. There is no tenderness. There is no rebound.  Musculoskeletal: Normal range of motion. He exhibits no edema.  Lymphadenopathy:    He has no cervical adenopathy.  Neurological: He is oriented to person, place, and time. He exhibits normal muscle tone. Coordination normal.  Skin: No rash noted. No erythema.  Psychiatric: He has a normal mood and affect. His behavior is normal.    ED Course  Procedures (including critical care time) Labs Review Labs Reviewed  CBC - Abnormal; Notable for the following:    Platelets 124 (*)    All other components within normal limits  BRAIN NATRIURETIC PEPTIDE - Abnormal; Notable for the following:    B Natriuretic Peptide 224.4 (*)    All other components within normal limits  HEPATIC FUNCTION PANEL - Abnormal; Notable for the following:    Albumin 3.3 (*)    AST 46 (*)    All other components within normal limits  CULTURE, BLOOD (ROUTINE X 2)  CULTURE, BLOOD (ROUTINE X 2)  BASIC METABOLIC PANEL  T-HELPER CELLS (CD4) COUNT  I-STAT TROPOININ, ED  I-STAT CHEM 8, ED    Imaging Review Dg Chest Port 1 View  07/07/2014   CLINICAL DATA:  39 year old male with chest pain and shortness of breath. Diabetic hypertensive smoker with HIV. Initial encounter.  EXAM: PORTABLE CHEST - 1 VIEW  COMPARISON:  12/20/2013.  FINDINGS: Significant consolidation left mid lower lobe suggestive of infectious infiltrate. Recommend followup until clearance.  Mild central pulmonary vascular prominence.  No pneumothorax.  Heart size top-normal.  IMPRESSION: Left mid to lower lobe prominent consolidation suggestive of infectious infiltrate. Recommend followup until clearance.  Mild central pulmonary vascular prominence.  These results were called by telephone at the time of interpretation on 07/07/2014 at 2:05 pm to Dr. Milton Ferguson , who verbally acknowledged these results.    Electronically Signed   By: Genia Del M.D.   On: 07/07/2014 14:05     EKG Interpretation   Date/Time:  Tuesday July 07 2014 12:18:57 EDT Ventricular Rate:  118 PR Interval:  140 QRS Duration: 110 QT Interval:  364 QTC Calculation: 510 R Axis:   -35 Text Interpretation:  Sinus tachycardia Biatrial enlargement Left axis  deviation Left ventricular hypertrophy with repolarization abnormality  Cannot rule out Septal infarct , age undetermined Abnormal ECG Confirmed  by Camaryn Lumbert  MD, Noeli Lavery [id number]) on 07/07/2014 1:23:52 PM     CRITICAL CARE Performed by: Tobenna Needs L Total critical care time: 40 Critical care time was exclusive of separately billable procedures and treating other patients. Critical care was necessary to treat or prevent imminent or life-threatening deterioration. Critical care was time spent personally by me on the following activities: development of treatment plan with patient and/or surrogate as well as nursing, discussions with consultants, evaluation of patient's response to treatment, examination of patient,  obtaining history from patient or surrogate, ordering and performing treatments and interventions, ordering and review of laboratory studies, ordering and review of radiographic studies, pulse oximetry and re-evaluation of patient's condition.  MDM   Final diagnoses:  Community acquired pneumonia    Admit pneumoia    Milton Ferguson, MD 07/07/14 1446

## 2014-07-07 NOTE — H&P (Signed)
Date: 07/07/2014               Patient Name:  Shaun Brennan MRN: [id number]  DOB: 1975-08-13 Age / Sex: 39 y.o., male   PCP: Lorayne Marek, MD              Medical Service: Internal Medicine Teaching Service              Attending Physician: Dr. Annia Belt, MD    First Contact: Dr. Marvel Plan Pager: [phone]  Second Contact: Dr. Denton Brick Pager: [phone]            After Hours (After 5p/  First Contact Pager: [phone]  weekends / holidays): Second Contact Pager: [phone]   Chief Complaint: Worsening shortness of breath  History of Present Illness: This is a 39 year old homeless gentleman with past medical history of systolic CHF, HIV infection, HTN and DM 2, who presents with worsening shortness of breath and cough for one week. Patient has been experiencing productive cough with yellow and blood stained sputum over the past several days, but last night his shortness of breath significantly worsened which prompted him to present to the ED. He endorses substernal chest pain which is worsened by cough. He also reports subjective fevers but has not checked his temperature at home. He generally feels very weak today. No nausea, vomiting. He has no recent contact with sick persons. He denies lower extremity swelling, PND, or orthopnea.   Review of Systems  HEENT: No URI symptoms. No neck pain or photophobia  Cardiovascular: reports palpitations. Gastrointestinal: No N/V or diarrhea. No abdominal pain. No hematochezia or melena.  Genitourinary: No dysuria, urgency, frequency, hematuria, flank pain and difficulty urinating.  Musculoskeletal: chronic back pain. No joint swelling, arthralgias and gait problem.  Skin: No rash and wound. No easy bruising. Neurological: No dizziness, seizures, syncope, weakness, LH, numbness and headaches.  Psychiatric/Behavioral: No SI, mood changes, confusion, nervousness, sleep disturbance and agitation  Meds:  Current Outpatient Prescriptions   Medication Sig Dispense Refill  . carvedilol (COREG) 12.5 MG tablet Take 1 tablet (12.5 mg total) by mouth 2 (two) times daily with a meal. 60 tablet 3  . dolutegravir (TIVICAY) 50 MG tablet Take 1 tablet (50 mg total) by mouth daily. 30 tablet 5  . emtricitabine-tenofovir (TRUVADA) 200-300 MG per tablet Take 1 tablet by mouth daily. 30 tablet 5  . furosemide (LASIX) 40 MG tablet Take only on Mondays and Fridays 12 tablet 2  . glucose monitoring kit (FREESTYLE) monitoring kit 1 each by Does not apply route 4 (four) times daily - after meals and at bedtime. 1 month Diabetic Testing Supplies for QAC-QHS accuchecks. 1 each 1  . hydrALAZINE (APRESOLINE) 50 MG tablet Take 1 tablet (50 mg total) by mouth every 8 (eight) hours. 90 tablet 3  . isosorbide mononitrate (IMDUR) 30 MG 24 hr tablet Take 1 tablet (30 mg total) by mouth daily. 30 tablet 3  . losartan (COZAAR) 50 MG tablet Take 1 tablet (50 mg total) by mouth daily. 30 tablet 3  . metFORMIN (GLUCOPHAGE) 500 MG tablet Take 1 tablet (500 mg total) by mouth 2 (two) times daily with a meal. 60 tablet 0    Allergies: Allergies as of 07/07/2014 - Review Complete 07/07/2014  Allergen Reaction Noted  . Lisinopril Cough 03/25/2014   Past Medical History  Diagnosis Date  . HIV (human immunodeficiency virus infection)   . Diabetes mellitus without complication   . Hypertension   . Chronic systolic heart  failure     a. EF 15-20%, grade II DD, LA mild/mod dilated  . DM2 (diabetes mellitus, type 2)    Past Surgical History  Procedure Laterality Date  . Ankle surgery Right    Family History  Problem Relation Age of Onset  . Other Father   . Diabetes Mother   . Stroke Paternal Grandfather    History   Social History  . Marital Status: Single    Spouse Name: N/A  . Number of Children: N/A  . Years of Education: N/A   Occupational History  . Not on file.   Social History Main Topics  . Smoking status: Current Every Day Smoker -- 0.25  packs/day    Types: Cigarettes  . Smokeless tobacco: Never Used     Comment: about 10 cigarettes a week  . Alcohol Use: Yes     Comment: socially about 6 pack per week   . Drug Use: Yes    Special: Marijuana  . Sexual Activity: Yes     Comment: pt. given condoms   Other Topics Concern  . Not on file   Social History Narrative   Currently homeless    Physical Exam: Blood pressure 168/98, pulse 115, temperature 100.3 F (37.9 C), temperature source Oral, resp. rate 21, height $RemoveBe'5\' 5"'gYNyYnFQJ$  (1.651 m), weight 196 lb (88.905 kg), SpO2 99 %.  General: well developed, well nourished. In moderate respiratory acute distress, cooperative with exam. Unable to speak in full sentences. HEENT: Neck is supple. Head is Atraumatic. Normal EOM. Pupils equal, round and reactive; anicteric. Nose/throat: oropharynx clear, moist mucous membranes, pink gums  Lungs/Chest wall: reduced air movement on the left side. Increased work of breathing  Heart: tachycardia, normal rhythm; no murmurs. No JVD Pulses: radial and dorsalis pedis pulses are 2+ and symmetric  Abdomen: Normal fullness, no rebound, guarding, or rigidity; nl BS; no palpable masses.  Skin: warm, dry, intact, normal turgor, no rashes  Extremities: no peripheral edema, clubbing, or cyanosis Neurologic: A&O X3, CN II - XII are grossly intact. Strength 5/5 in the all 4 extremities, Sensation grossly intact. Psych: Normal mood and affect. Normal speech and behavior. No agitation   Lab results: Basic Metabolic Panel:  Recent Labs  07/07/14 1224 07/07/14 1448  NA 129* 130*  K 4.6 5.0  CL 92* 93*  CO2 29  --   GLUCOSE 327* 299*  BUN 9 9  CREATININE 1.08 0.80  CALCIUM 8.5  --    Liver Function Tests:  Recent Labs  07/07/14 1224  AST 46*  ALT 46  ALKPHOS 100  BILITOT 0.7  PROT 7.9  ALBUMIN 3.3*   CBC:  Recent Labs  07/07/14 1224 07/07/14 1448  WBC 8.7  --   HGB 15.1 15.6  HCT 43.4 46.0  MCV 96.7  --   PLT 124*  --       Imaging results:  Dg Chest Port 1 View  07/07/2014   CLINICAL DATA:  39 year old male with chest pain and shortness of breath. Diabetic hypertensive smoker with HIV. Initial encounter.  EXAM: PORTABLE CHEST - 1 VIEW  COMPARISON:  12/20/2013.  FINDINGS: Significant consolidation left mid lower lobe suggestive of infectious infiltrate. Recommend followup until clearance.  Mild central pulmonary vascular prominence.  No pneumothorax.  Heart size top-normal.  IMPRESSION: Left mid to lower lobe prominent consolidation suggestive of infectious infiltrate. Recommend followup until clearance.  Mild central pulmonary vascular prominence.  These results were called by telephone at the time of interpretation  on 07/07/2014 at 2:05 pm to Dr. Milton Ferguson , who verbally acknowledged these results.   Electronically Signed   By: Genia Del M.D.   On: 07/07/2014 14:05    Other results: EKG: sinus bradycardia, TWI in lateral leads.  Assessment & Plan by Problem: Principal Problem:   Acute respiratory distress Active Problems:   DM2 (diabetes mellitus, type 2)   Chronic systolic heart failure   Community acquired pneumonia   CAP (community acquired pneumonia)  This is a 39 year old gentleman with past medical history of HIV infection, hypertension, CHF, and homelessness who presents with community-acquired pneumonia.  Community acquired pneumonia: In the setting of immune suppression due to HIV infection. Tachycardic and was hypoxic on presentation, but quickly corrected with supplemental oxygen. Chest x-ray revealing left mid lower lobar pneumonia. Possible causative organisms in this patient include strep pneumoniae and H influenza. Given chest x-ray findings, less likely to be PCP. Afebrile in the ED. No leukocytosis. He was started on IV Zosyn and IV vancomycin in the ED. Plan -Discontinue Zosyn and Vancomycin. No risk factors for pseudomonas or MRSA. -Start IV Rocephin and IV Azithromycin. -Stat  ABG. -Supplemental oxygen to maintain O2 >92%. Patient is at high risk of respiratory decompensation that may require mechanical ventilation. He will therefore be admitted to a SDU for close clinical monitoring -will consult pulmonary service if it continues to worsen. -Check urine Legionella and strep antigens. -Blood cultures pending. -Sputum culture and Gram stain. -Screen with flu panel  HIV infection: CD4 270, viral load 10,387 on 11/16/2013. On Truvada and Tivicay. He reports compliance but has a h/o of noncompliance per Dr Algis Downs note. He follows up with the ID clinic and last clinic visit was 01/13/14.  Plan  -Check viral load and CD4 count here -Continue with home antiretroviral regimen  Systolic CHF: ?NICM. ECHO (12/08/2013): EF 15-20%, grade II DD, mild/mod LA, RV normal. ECHO 04/27/2014 EF 25-30% RV mildy HK. On clinical exam he appears euvolemic. He follows up with cardiology by Dr. Stanford Breed. His home regimen includes furosemide 40 mg on Mondays and Fridays, hydralazine 50 mg every 8 hours, losartan 50 mg daily, and Imdur 30 mg daily Plan -Continue with furosemide 40 mg on Mon and Fri. -Hydralazine 50 mg 3 times a day -Losartan 50 mg daily. -Imdur 30 mg daily  HTN: BP is stable. Continue with above regimen in CHF.  Diabetes 2: Without apparent complications. A1c on 12/06/2013 9.7%. CBG 327 in ED. Takes metformin 500 mg twice a day. Plan -Lantus 15 mg daily at bedtime -Sliding scale insulin -Monitor CBGs.  Code Status: Full code   F/E/N: carb modified  VTE Ppx: subQ Lovenox  Family Communication: Discussed with the patient about his plan of care. Patient has no immediate family except his mom lives in [place], [place] and is currently homeless. He understands that he might need ventilatory support if his respiratory status worsens and is agreeable.  Dispo: Disposition is deferred at this time, awaiting improvement of current medical problems. Anticipated  discharge in approximately 3-4 day(s).   The patient does have a current PCP (Deepak Advani, MD), therefore is not require OPC follow-up after discharge.   The patient does have transportation limitations that hinder transportation to clinic appointments.   Signed:  Jessee Avers, MD PGY-3 Internal Medicine Teaching Service Pager [phone]  07/07/2014, 6:58 PM

## 2014-07-07 NOTE — Progress Notes (Signed)
Received patient from ED; oriented to room. Alert and oriented x 4. CHG bath given. Mrsa Swab collected.

## 2014-07-08 ENCOUNTER — Ambulatory Visit: Payer: Self-pay | Admitting: Internal Medicine

## 2014-07-08 DIAGNOSIS — Z794 Long term (current) use of insulin: Secondary | ICD-10-CM

## 2014-07-08 DIAGNOSIS — I5022 Chronic systolic (congestive) heart failure: Secondary | ICD-10-CM

## 2014-07-08 DIAGNOSIS — Z590 Homelessness: Secondary | ICD-10-CM

## 2014-07-08 DIAGNOSIS — J189 Pneumonia, unspecified organism: Secondary | ICD-10-CM

## 2014-07-08 DIAGNOSIS — D72829 Elevated white blood cell count, unspecified: Secondary | ICD-10-CM

## 2014-07-08 DIAGNOSIS — R509 Fever, unspecified: Secondary | ICD-10-CM

## 2014-07-08 LAB — GLUCOSE, CAPILLARY
GLUCOSE-CAPILLARY: 193 mg/dL — AB (ref 70–99)
GLUCOSE-CAPILLARY: 280 mg/dL — AB (ref 70–99)
Glucose-Capillary: 145 mg/dL — ABNORMAL HIGH (ref 70–99)
Glucose-Capillary: 294 mg/dL — ABNORMAL HIGH (ref 70–99)

## 2014-07-08 LAB — CD4/CD8 (T-HELPER/T-SUPPRESSOR CELL)
CD4 absolute: 470 /uL — ABNORMAL LOW (ref 500–1900)
CD4%: 30 % (ref 30.0–60.0)
CD8 T Cell Abs: 610 /uL (ref 230–1000)
CD8TOX: 40 % (ref 15.0–40.0)
RATIO: 0.76 — AB (ref 1.0–3.0)
Total lymphocyte count: 1540 /uL (ref 1000–4000)

## 2014-07-08 LAB — LACTATE DEHYDROGENASE: LDH: 251 U/L — ABNORMAL HIGH (ref 94–250)

## 2014-07-08 LAB — COMPREHENSIVE METABOLIC PANEL
ALBUMIN: 2.8 g/dL — AB (ref 3.5–5.2)
ALK PHOS: 77 U/L (ref 39–117)
ALT: 33 U/L (ref 0–53)
AST: 29 U/L (ref 0–37)
Anion gap: 8 (ref 5–15)
BILIRUBIN TOTAL: 0.7 mg/dL (ref 0.3–1.2)
BUN: 10 mg/dL (ref 6–23)
CHLORIDE: 96 mmol/L (ref 96–112)
CO2: 28 mmol/L (ref 19–32)
CREATININE: 1.01 mg/dL (ref 0.50–1.35)
Calcium: 8.5 mg/dL (ref 8.4–10.5)
GFR calc non Af Amer: >90 mL/min (ref >90)
GLUCOSE: 158 mg/dL — AB (ref 70–99)
POTASSIUM: 4 mmol/L (ref 3.5–5.1)
Sodium: 132 mmol/L — ABNORMAL LOW (ref 135–145)
Total Protein: 7.7 g/dL (ref 6.0–8.3)

## 2014-07-08 LAB — CBC
HCT: 39.3 % (ref 39.0–52.0)
Hemoglobin: 13.4 g/dL (ref 13.0–17.0)
MCH: 32.8 pg (ref 26.0–34.0)
MCHC: 34.1 g/dL (ref 30.0–36.0)
MCV: 96.3 fL (ref 78.0–100.0)
Platelets: 112 10*3/uL — ABNORMAL LOW (ref 150–400)
RBC: 4.08 MIL/uL — ABNORMAL LOW (ref 4.22–5.81)
RDW: 13.5 % (ref 11.5–15.5)
WBC: 12.4 10*3/uL — AB (ref 4.0–10.5)

## 2014-07-08 LAB — CBC WITH DIFFERENTIAL/PLATELET
Basophils Absolute: 0 10*3/uL (ref 0.0–0.1)
Basophils Relative: 0 % (ref 0–1)
EOS ABS: 0.1 10*3/uL (ref 0.0–0.7)
EOS PCT: 0 % (ref 0–5)
HCT: 40 % (ref 39.0–52.0)
Hemoglobin: 13.8 g/dL (ref 13.0–17.0)
LYMPHS PCT: 11 % — AB (ref 12–46)
Lymphs Abs: 1.3 10*3/uL (ref 0.7–4.0)
MCH: 33.4 pg (ref 26.0–34.0)
MCHC: 34.5 g/dL (ref 30.0–36.0)
MCV: 96.9 fL (ref 78.0–100.0)
MONOS PCT: 7 % (ref 3–12)
Monocytes Absolute: 0.8 10*3/uL (ref 0.1–1.0)
Neutro Abs: 9.9 10*3/uL — ABNORMAL HIGH (ref 1.7–7.7)
Neutrophils Relative %: 82 % — ABNORMAL HIGH (ref 43–77)
Platelets: 116 10*3/uL — ABNORMAL LOW (ref 150–400)
RBC: 4.13 MIL/uL — ABNORMAL LOW (ref 4.22–5.81)
RDW: 13.4 % (ref 11.5–15.5)
WBC: 12.1 10*3/uL — ABNORMAL HIGH (ref 4.0–10.5)

## 2014-07-08 LAB — STREP PNEUMONIAE URINARY ANTIGEN: Strep Pneumo Urinary Antigen: NEGATIVE

## 2014-07-08 MED ORDER — CARVEDILOL 12.5 MG PO TABS
12.5000 mg | ORAL_TABLET | Freq: Two times a day (BID) | ORAL | Status: DC
  Administered 2014-07-08 – 2014-07-13 (×11): 12.5 mg via ORAL
  Filled 2014-07-08 (×14): qty 1

## 2014-07-08 MED ORDER — OXYCODONE HCL 5 MG PO TABS
5.0000 mg | ORAL_TABLET | Freq: Once | ORAL | Status: AC
  Administered 2014-07-08: 5 mg via ORAL
  Filled 2014-07-08: qty 1

## 2014-07-08 MED ORDER — IBUPROFEN 200 MG PO TABS: 600.0000 mg | ORAL_TABLET | Freq: Four times a day (QID) | ORAL | Status: DC | PRN

## 2014-07-08 MED ORDER — LOSARTAN POTASSIUM 50 MG PO TABS
50.0000 mg | ORAL_TABLET | Freq: Two times a day (BID) | ORAL | Status: DC
  Administered 2014-07-08 – 2014-07-13 (×10): 50 mg via ORAL
  Filled 2014-07-08 (×11): qty 1

## 2014-07-08 MED ORDER — FUROSEMIDE 40 MG PO TABS
40.0000 mg | ORAL_TABLET | Freq: Once | ORAL | Status: AC
  Administered 2014-07-10: 40 mg via ORAL
  Filled 2014-07-08: qty 1

## 2014-07-08 MED ORDER — IPRATROPIUM-ALBUTEROL 0.5-2.5 (3) MG/3ML IN SOLN
3.0000 mL | RESPIRATORY_TRACT | Status: DC
  Administered 2014-07-08 – 2014-07-09 (×3): 3 mL via RESPIRATORY_TRACT
  Filled 2014-07-08 (×4): qty 3

## 2014-07-08 MED ORDER — ACETAMINOPHEN 325 MG PO TABS
650.0000 mg | ORAL_TABLET | Freq: Four times a day (QID) | ORAL | Status: DC | PRN
  Administered 2014-07-08 – 2014-07-09 (×2): 650 mg via ORAL
  Filled 2014-07-08 (×2): qty 2

## 2014-07-08 MED ORDER — GUAIFENESIN-DM 100-10 MG/5ML PO SYRP
5.0000 mL | ORAL_SOLUTION | ORAL | Status: DC | PRN
  Administered 2014-07-08 (×2): 5 mL via ORAL
  Filled 2014-07-08 (×4): qty 5

## 2014-07-08 NOTE — Progress Notes (Signed)
Utilization Review Completed.  

## 2014-07-08 NOTE — Progress Notes (Signed)
Pt complaining of cough and chest pain related to said cough. Pt also has temp of 100.8. No PRN's. MD notified. Orders received. Will continue to monitor pt.

## 2014-07-08 NOTE — Progress Notes (Signed)
Subjective: Shaun Brennan is a 39 y.o. male/male with PMHx of HIV, diabetes, HTN, HFrEF, and tobacco use who presented with SOB, hemoptysis, cough, hypoxia, and CXR with LLL consolidation.   Overnight, [name] became febrile with a Tmax of 38.2. He was weaned to room air with sats in the 90s. Denies subjective fevers, but complains of sweats, continued chest pain with cough. Received oxycodone IR 5 mg for pain overnight. He has good po fluid intake. Denies any nausea or vomiting.   Objective: Vital signs in last 24 hours: Filed Vitals:   07/08/14 0529 07/08/14 0700 07/08/14 0829 07/08/14 0900  BP: 166/115 172/96 160/101 156/91  Pulse:  104 113 103  Temp:   99.7 F (37.6 C)   TempSrc:   Oral   Resp:  Height:      Weight:      SpO2:  97% 9% 95%   Weight change:   Intake/Output Summary (Last 24 hours) at 07/08/14 1045 Last data filed at 07/08/14 0600  Gross per 24 hour  Intake   2710 ml  Output   3050 ml  Net   -340 ml   BP 156/91 mmHg  Pulse 103  Temp(Src) 99.7 F (37.6 C) (Oral)  Resp 27  Ht  (1.651 m)  Wt 86.3 kg (190 lb 4.1 oz)  BMI 31.66 kg/m2  SpO2 95%   General: Lying in bed in no acute distress.  Pulm: Improved work of breathing from yesterday, continued decreased breath sounds over left lung fields.  CV: Tachycardic, otherwise regular rhythm with no murmurs.   Lab Results: Basic Metabolic Panel:  Recent Labs Lab 07/07/14 1224 07/07/14 1448 07/08/14 0320  NA 129* 130* 132*  K 4.6 5.0 4.0  CL 92* 93* 96  CO2 29  --  28  GLUCOSE 327* 299* 158*  BUN CREATININE 1.08 0.80 1.01  CALCIUM 8.5  --  8.5   Liver Function Tests:  Recent Labs Lab 07/07/14 1224 07/08/14 0320  AST 46* 29  ALT 46 33  ALKPHOS 100 77  BILITOT 0.7 0.7  PROT 7.9 7.7  ALBUMIN 3.3* 2.8*   CBC:  Recent Labs Lab 07/07/14 1224 07/07/14 1448 07/08/14 0320  WBC 8.7  --  12.4*  HGB 15.1 15.6 13.4  HCT 43.4 46.0 39.3  MCV 96.7  --  96.3  PLT  124*  --  112*   CBG:  Recent Labs Lab 07/07/14 2138 07/08/14 0829  GLUCAP 332* 145*   Micro Results: Recent Results (from the past 240 hour(s))  Culture, blood (routine x 2)     Status: None (Preliminary result)   Collection Time: 07/07/14  2:24 PM  Result Value Ref Range Status   Specimen Description BLOOD LEFT ARM  Final   Special Requests BOTTLES DRAWN AEROBIC AND ANAEROBIC  Final   Culture   Final           BLOOD CULTURE RECEIVED NO GROWTH TO DATE CULTURE WILL BE HELD FOR 5 DAYS BEFORE ISSUING A FINAL NEGATIVE REPORT Performed at [facility]    Report Status PENDING  Incomplete  Culture, blood (routine x 2)     Status: None (Preliminary result)   Collection Time: 07/07/14  2:30 PM  Result Value Ref Range Status   Specimen Description BLOOD BLOOD LEFT FOREARM  Final   Special Requests BOTTLES DRAWN AEROBIC AND ANAEROBIC  Final   Culture   Final  BLOOD CULTURE RECEIVED NO GROWTH TO DATE CULTURE WILL BE HELD FOR 5 DAYS BEFORE ISSUING A FINAL NEGATIVE REPORT Performed at [facility]    Report Status PENDING  Incomplete  MRSA PCR Screening     Status: None   Collection Time: 07/07/14  6:52 PM  Result Value Ref Range Status   MRSA by PCR NEGATIVE NEGATIVE Final    Comment:        The GeneXpert MRSA Assay (FDA approved for NASAL specimens only), is one component of a comprehensive MRSA colonization surveillance program. It is not intended to diagnose MRSA infection nor to guide or monitor treatment for MRSA infections.    Studies/Results: Dg Chest Port 1 View  07/07/2014   CLINICAL DATA:  39 year old male with chest pain and shortness of breath. Diabetic hypertensive smoker with HIV. Initial encounter.  EXAM: PORTABLE CHEST - 1 VIEW  COMPARISON:  12/20/2013.  FINDINGS: Significant consolidation left mid lower lobe suggestive of infectious infiltrate. Recommend followup until clearance.  Mild central pulmonary vascular prominence.   No pneumothorax.  Heart size top-normal.  IMPRESSION: Left mid to lower lobe prominent consolidation suggestive of infectious infiltrate. Recommend followup until clearance.  Mild central pulmonary vascular prominence.  These results were called by telephone at the time of interpretation on 07/07/2014 at 2:05 pm to Dr. Bethann Berkshire , who verbally acknowledged these results.   Electronically Signed   By: Lacy Duverney M.D.   On: 07/07/2014 14:05   Medications:  Scheduled Meds: . azithromycin  500 mg Intravenous Q24H  . carvedilol  12.5 mg Oral BID WC  . cefTRIAXone (ROCEPHIN)  IV  1 g Intravenous Q24H  . dolutegravir  50 mg Oral Daily  . emtricitabine-tenofovir  1 tablet Oral Daily  . enoxaparin (LOVENOX) injection  40 mg Subcutaneous Q24H  . furosemide  40 mg Oral Daily  . hydrALAZINE  50 mg Oral 3 times per day  . insulin aspart  0-9 Units Subcutaneous TID WC  . insulin detemir  15 Units Subcutaneous QHS  . ipratropium-albuterol  3 mL Nebulization Q4H  . isosorbide mononitrate  30 mg Oral Daily  . losartan  50 mg Oral Daily  PRN Meds:.acetaminophen, guaiFENesin-dextromethorphan, ibuprofen   Assessment/Plan: [name] is a 39 yo male with a PMH significant for HIV, DM, HTN, and HFrEF who is admitted for further evaluation of SOB, hemoptysis, cough, hypoxia, and CXR with LLL consolidation c/w CAP.   Principal Problem:   Acute respiratory distress Active Problems:   DM2 (diabetes mellitus, type 2)   Chronic systolic heart failure   Community acquired pneumonia   CAP (community acquired pneumonia)  ** SOB, hemoptysis, pleuritic chest pain, hypoxia: Most likely etiology is CAP. With his history of immunosuppression 2/2 HIV, the differential for etiologies is broad. Most recent CD4 count was 270 in September '15, but with his intermittent use of his antiretrovirals since then, it is difficult to know his degree of immunosuppression currently. CD4 and HIV viral load are currently pending;  however, he may have a transiently decreased CD4 count in the setting of possible bacterial pneumonia, making this difficult to interpret. With elevated LDH to 389 and mildly elevated AST of 46 on admission, PCP is a consideration; however, we would expect his CXR to show more diffuse infiltrates. Decompensated HFrEF is likely not contributing given his clinically euvolemic appearance. His improvement in oxygenation overnight may reflect improvement with antibiotics vs having received his furosemide (unclear medication compliance prior to admission).  -- Will  continue IV ceftriaxone and azithromycin.  -- Follow up blood cultures with no growth x1 day -- Streptococcus pneumoniae urinary antigen negative -- Follow-up pending legionella urinary antigen, sputum culture with gram stain, respiratory viral panel, and sputum DFA. -- Will start guaifenisen-dextromethorphan and duonebs today for cough and SOB.  -- Will start tylenol and ibuprofen for fever and chest pain 2/2 cough.  -- Will titrate supplemental oxygen to maintain O2 >92%; currently on room air.   ** HIV Infection: Last CD4 of 270 and viral load 10,387 in August of 2015. Followed with Dr. Ninetta Lights with ID and on truvada and tivicay with uncertain medication compliance.  -- Continue home antiretrovirals -- Follow-up pending CD4 and HIV viral load.  ** HFrEF, HTN: History of malignant HTN and non-ischemic cardiomyopathy felt to be most likely due to HTN and HIV. Clinically euvolemic on exam, though BNP was elevated on admission. With home BP meds, BP has been elevated to 150s-170s/90s-100s.  -- Continue home heart failure and HTN meds including carvedilol, furosemide, hydralazine, imdur, and losartan.   ** Diabetes, type 2: Last A1c was August '15 at 9.7. On metformin at home. Glucoses in the 100-300s since admission.  -- Continue home metformin.  -- Continue insulin determir 15 units QHS and SSI -- Will recheck A1c during this admission   **  FEN/GI:  -- Modified carb diet   ** PPx:  -- subcu lovenox   ** Dispo:  Full code Currently on IV antibiotics, and close monitoring for worsening respiratory function; anticipated discharge in 1-2 days.   The patient does have a current PCP (Doris Cheadle, MD) and does not need an [facility] hospital follow-up appointment after discharge.  The patient does have transportation limitations, including homelessness that hinder transportation to clinic appointments.  .Services Needed at time of discharge: Y = Yes, Blank = No PT:   OT:   RN:   Equipment:   Other:     LOS: 1 day

## 2014-07-08 NOTE — Progress Notes (Signed)
Subjective:  Patient was seen and examined this morning. Patient continues to feel short of breath and complains of cough. Both have improved minimally overnight. He admits to diaphoresis as well. He also has a dull substernal chest pain when coughs that goes away. He denies any nausea, vomiting, diarrhea or abdominal pain. He has been tolerating a regular diet but requests meat.   Objective: Vital signs in last 24 hours: Filed Vitals:   07/08/14 0529 07/08/14 0700 07/08/14 0829 07/08/14 0900  BP: 166/115 172/96 160/101 156/91  Pulse:  104 113 103  Temp:   99.7 F (37.6 C)   TempSrc:   Oral   Resp:  $Remo'20 19 27  'fzsYD$ Height:      Weight:      SpO2:  97% 9% 95%   Weight change:   Intake/Output Summary (Last 24 hours) at 07/08/14 1128 Last data filed at 07/08/14 0600  Gross per 24 hour  Intake   2710 ml  Output   3050 ml  Net   -340 ml   General: Vital signs reviewed.  Patient is well-developed and well-nourished, in mild acute distress and cooperative with exam.  Cardiovascular: Tachycardic, regular rhythm. Pulmonary/Chest: Decreased breath sounds on the left. Diffuse mild rhonchi. No wheezing or rales. Increased tactile fremitus on lower left. Increased work of breathing as patient cannot speak in full sentences, but improved from yesterday. Abdominal: Soft, non-tender, non-distended, BS + Extremities: No lower extremity edema bilaterally Neurological: A&O x3 Skin: Warm, dry and intact. No rashes or erythema. Psychiatric: Normal mood and affect. speech and behavior is normal. Cognition and memory are normal.   Lab Results: Basic Metabolic Panel:  Recent Labs Lab 07/07/14 1224 07/07/14 1448 07/08/14 0320  NA 129* 130* 132*  K 4.6 5.0 4.0  CL 92* 93* 96  CO2 29  --  28  GLUCOSE 327* 299* 158*  BUN $Re'9 9 10  'RQS$ CREATININE 1.08 0.80 1.01  CALCIUM 8.5  --  8.5   Liver Function Tests:  Recent Labs Lab 07/07/14 1224 07/08/14 0320  AST 46* 29  ALT 46 33  ALKPHOS 100 77    BILITOT 0.7 0.7  PROT 7.9 7.7  ALBUMIN 3.3* 2.8*   CBC:  Recent Labs Lab 07/07/14 1224 07/07/14 1448 07/08/14 0320  WBC 8.7  --  12.4*  HGB 15.1 15.6 13.4  HCT 43.4 46.0 39.3  MCV 96.7  --  96.3  PLT 124*  --  112*   CBG:  Recent Labs Lab 07/07/14 2138 07/08/14 0829  GLUCAP 332* 145*   Urine Drug Screen: Drugs of Abuse     Component Value Date/Time   LABOPIA NONE DETECTED 12/20/2013 1321   COCAINSCRNUR NONE DETECTED 12/20/2013 1321   LABBENZ NONE DETECTED 12/20/2013 1321   AMPHETMU NONE DETECTED 12/20/2013 1321   THCU POSITIVE* 12/20/2013 1321   LABBARB NONE DETECTED 12/20/2013 1321    Micro Results: Recent Results (from the past 240 hour(s))  Culture, blood (routine x 2)     Status: None (Preliminary result)   Collection Time: 07/07/14  2:24 PM  Result Value Ref Range Status   Specimen Description BLOOD LEFT ARM  Final   Special Requests BOTTLES DRAWN AEROBIC AND ANAEROBIC 10MLS  Final   Culture   Final           BLOOD CULTURE RECEIVED NO GROWTH TO DATE CULTURE WILL BE HELD FOR 5 DAYS BEFORE ISSUING A FINAL NEGATIVE REPORT Performed at [facility]    Report Status  PENDING  Incomplete  Culture, blood (routine x 2)     Status: None (Preliminary result)   Collection Time: 07/07/14  2:30 PM  Result Value Ref Range Status   Specimen Description BLOOD BLOOD LEFT FOREARM  Final   Special Requests BOTTLES DRAWN AEROBIC AND ANAEROBIC 10MLS  Final   Culture   Final           BLOOD CULTURE RECEIVED NO GROWTH TO DATE CULTURE WILL BE HELD FOR 5 DAYS BEFORE ISSUING A FINAL NEGATIVE REPORT Performed at [facility]    Report Status PENDING  Incomplete  MRSA PCR Screening     Status: None   Collection Time: 07/07/14  6:52 PM  Result Value Ref Range Status   MRSA by PCR NEGATIVE NEGATIVE Final    Comment:        The GeneXpert MRSA Assay (FDA approved for NASAL specimens only), is one component of a comprehensive MRSA colonization surveillance  program. It is not intended to diagnose MRSA infection nor to guide or monitor treatment for MRSA infections.    Studies/Results: Dg Chest Port 1 View  07/07/2014   CLINICAL DATA:  39 year old male with chest pain and shortness of breath. Diabetic hypertensive smoker with HIV. Initial encounter.  EXAM: PORTABLE CHEST - 1 VIEW  COMPARISON:  12/20/2013.  FINDINGS: Significant consolidation left mid lower lobe suggestive of infectious infiltrate. Recommend followup until clearance.  Mild central pulmonary vascular prominence.  No pneumothorax.  Heart size top-normal.  IMPRESSION: Left mid to lower lobe prominent consolidation suggestive of infectious infiltrate. Recommend followup until clearance.  Mild central pulmonary vascular prominence.  These results were called by telephone at the time of interpretation on 07/07/2014 at 2:05 pm to Dr. Milton Ferguson , who verbally acknowledged these results.   Electronically Signed   By: Genia Del M.D.   On: 07/07/2014 14:05   Medications:  I have reviewed the patient's current medications. Prior to Admission:  Prescriptions prior to admission  Medication Sig Dispense Refill Last Dose  . carvedilol (COREG) 12.5 MG tablet Take 1 tablet (12.5 mg total) by mouth 2 (two) times daily with a meal. 60 tablet 3 07/06/2014 at 6p  . dolutegravir (TIVICAY) 50 MG tablet Take 1 tablet (50 mg total) by mouth daily. 30 tablet 5 07/07/2014 at Unknown time  . emtricitabine-tenofovir (TRUVADA) 200-300 MG per tablet Take 1 tablet by mouth daily. 30 tablet 5 07/07/2014 at Unknown time  . furosemide (LASIX) 40 MG tablet Take only on Mondays and Fridays 12 tablet 2 07/06/2014 at Unknown time  . glucose monitoring kit (FREESTYLE) monitoring kit 1 each by Does not apply route 4 (four) times daily - after meals and at bedtime. 1 month Diabetic Testing Supplies for QAC-QHS accuchecks. 1 each 1 Past Week at Unknown time  . hydrALAZINE (APRESOLINE) 50 MG tablet Take 1 tablet (50 mg  total) by mouth every 8 (eight) hours. 90 tablet 3 07/06/2014 at 7p  . isosorbide mononitrate (IMDUR) 30 MG 24 hr tablet Take 1 tablet (30 mg total) by mouth daily. 30 tablet 3 07/07/2014 at Unknown time  . losartan (COZAAR) 50 MG tablet Take 1 tablet (50 mg total) by mouth daily. 30 tablet 3 07/07/2014 at Unknown time  . metFORMIN (GLUCOPHAGE) 500 MG tablet Take 1 tablet (500 mg total) by mouth 2 (two) times daily with a meal. 60 tablet 0 07/07/2014 at Unknown time   Scheduled Meds: . azithromycin  500 mg Intravenous Q24H  . carvedilol  12.5  mg Oral BID WC  . cefTRIAXone (ROCEPHIN)  IV  1 g Intravenous Q24H  . dolutegravir  50 mg Oral Daily  . emtricitabine-tenofovir  1 tablet Oral Daily  . enoxaparin (LOVENOX) injection  40 mg Subcutaneous Q24H  . [START ON 07/10/2014] furosemide  40 mg Oral Once  . hydrALAZINE  50 mg Oral 3 times per day  . insulin aspart  0-9 Units Subcutaneous TID WC  . insulin detemir  15 Units Subcutaneous QHS  . ipratropium-albuterol  3 mL Nebulization Q4H  . isosorbide mononitrate  30 mg Oral Daily  . losartan  50 mg Oral Daily   Continuous Infusions:  PRN Meds:. Assessment/Plan: Principal Problem:   Acute respiratory distress Active Problems:   DM2 (diabetes mellitus, type 2)   Chronic systolic heart failure   Community acquired pneumonia   CAP (community acquired pneumonia)  This is a 40 year old gentleman with past medical history of HIV infection, hypertension, CHF, and homelessness who presents with community-acquired pneumonia.  Community Acquired Pneumonia: Chest x-ray revealed a left mid lower lobar pneumonia. Patient spiked fevers overnight with Tmax 100.8. He has been satting well on room air. Leukocytosis of 12.4 this morning. He continues to feel short of breath and has a productive cough. LDH elevated at 389. Strep pneumo negative. ABG revealed pH of 7.47, pCO2 36.2, pO2 76, bicarb 26.4. Preliminary blood cultures are NGTD.  -Continue Rocephin and IV  Azithromycin. -Supplemental oxygen to maintain O2 >92% -Consult pulmonary service if patient worsens -Urine Legionella pending  -Blood cultures pending -Sputum culture and Gram stain pending -Respiratory virus panel pending -Pneumocystis smear by DFA -Repeat LDH tomorrow am -Flu panel pending -CD4/HIV RNA pending -Repeat CBC/BMET tomorrow am -Droplet precautions -Robitussin DM -Duonebs Q4H prn  -Tylenol 650 mg Q6H prn fever -Ibuprofen 600 mg Q6H prn pain from cough  HIV: CD4 270, viral load 10,387 on 11/16/2013. On Truvada and Tivicay. He reports compliance but admits to a history of noncompliance. He follows with Dr. Johnnye Sima in the ID clinic.  -Viral load and CD4 pending -Tivicay 50 mg daily -Truvada 048-889 mg daily  Systolic CHF: Euvolemic. Last echo on 12/08/2013 showed EF 15-20%, grade II DD, mild/mod LA, RV normal. ECHO 04/27/2014 EF 25-30% RV mildy HK. Patient's home regimen includes furosemide 40 mg on Mondays and Fridays, hydralazine 50 mg every 8 hours, losartan 50 mg daily, and Imdur 30 mg daily. -Continue with furosemide 40 mg on M/F -Hydralazine 50 mg TID -Losartan 50 mg daily -Imdur 30 mg daily  HTN: Hypertensive this am at 160/101. Patient is on carvedilol 12.5 mg BID, lasix 40 mg MF, hydralazine 50 mg TID, losartan 50 mg daily and imdur 30 mg daily at home. -Continue with furosemide 40 mg on M/F -Hydralazine 50 mg TID -Losartan 50 mg daily -Imdur 30 mg daily  T2DM: A1c on 12/06/2013 9.7%. Patient is on metformin 500 mg twice a day at home. -Levemir 15 mg QHS -SSI-S -CBGs  -Recheck HgbA1c  Code Status: Full Dode   F/E/N: Diet Carb Mod  VTE Ppx: Lovenox SQ QD  Family Communication: Discussed with the patient about his plan of care. Patient has no immediate family except his mom lives in [place], [place] and is currently homeless. He understands that he might need ventilatory support if his respiratory status worsens and is agreeable.  Dispo:  Disposition is deferred at this time, awaiting improvement of current medical problems.  Anticipated discharge in approximately 1-2 day(s).   The patient does have a current  PCP (Lorayne Marek, MD) and does not need an [facility] hospital follow-up appointment after discharge.  The patient does not have transportation limitations that hinder transportation to clinic appointments.  .Services Needed at time of discharge: Y = Yes, Blank = No PT:   OT:   RN:   Equipment:   Other:     LOS: 1 day   Osa Craver, DO PGY-1 Internal Medicine Resident Pager # [phone] 07/08/2014 11:28 AM

## 2014-07-09 DIAGNOSIS — R0600 Dyspnea, unspecified: Secondary | ICD-10-CM

## 2014-07-09 LAB — EXPECTORATED SPUTUM ASSESSMENT W GRAM STAIN, RFLX TO RESP C

## 2014-07-09 LAB — HIV-1 RNA QUANT-NO REFLEX-BLD
HIV 1 RNA Quant: 60 copies/mL
LOG10 HIV-1 RNA: 1.778 log10copy/mL

## 2014-07-09 LAB — CBC
HCT: 39.4 % (ref 39.0–52.0)
Hemoglobin: 13.4 g/dL (ref 13.0–17.0)
MCH: 33.1 pg (ref 26.0–34.0)
MCHC: 34 g/dL (ref 30.0–36.0)
MCV: 97.3 fL (ref 78.0–100.0)
PLATELETS: 122 10*3/uL — AB (ref 150–400)
RBC: 4.05 MIL/uL — ABNORMAL LOW (ref 4.22–5.81)
RDW: 13.6 % (ref 11.5–15.5)
WBC: 10.2 10*3/uL (ref 4.0–10.5)

## 2014-07-09 LAB — BASIC METABOLIC PANEL
ANION GAP: 8 (ref 5–15)
BUN: 12 mg/dL (ref 6–23)
CALCIUM: 8.8 mg/dL (ref 8.4–10.5)
CO2: 27 mmol/L (ref 19–32)
Chloride: 97 mmol/L (ref 96–112)
Creatinine, Ser: 1.16 mg/dL (ref 0.50–1.35)
GFR, EST NON AFRICAN AMERICAN: 79 mL/min — AB (ref >90)
Glucose, Bld: 230 mg/dL — ABNORMAL HIGH (ref 70–99)
Potassium: 4.2 mmol/L (ref 3.5–5.1)
Sodium: 132 mmol/L — ABNORMAL LOW (ref 135–145)

## 2014-07-09 LAB — INFLUENZA PANEL BY PCR (TYPE A & B)
H1N1FLUPCR: NOT DETECTED
Influenza A By PCR: NEGATIVE
Influenza B By PCR: NEGATIVE

## 2014-07-09 LAB — GLUCOSE, CAPILLARY
GLUCOSE-CAPILLARY: 150 mg/dL — AB (ref 70–99)
GLUCOSE-CAPILLARY: 231 mg/dL — AB (ref 70–99)
Glucose-Capillary: 308 mg/dL — ABNORMAL HIGH (ref 70–99)
Glucose-Capillary: 218 mg/dL — ABNORMAL HIGH (ref 70–99)

## 2014-07-09 LAB — HEMOGLOBIN A1C
Hgb A1c MFr Bld: 8.6 % — ABNORMAL HIGH (ref 4.8–5.6)
MEAN PLASMA GLUCOSE: 200 mg/dL

## 2014-07-09 LAB — EXPECTORATED SPUTUM ASSESSMENT W REFEX TO RESP CULTURE

## 2014-07-09 MED ORDER — GUAIFENESIN 200 MG PO TABS
200.0000 mg | ORAL_TABLET | ORAL | Status: DC | PRN
  Administered 2014-07-09: 200 mg via ORAL
  Filled 2014-07-09 (×3): qty 1

## 2014-07-09 MED ORDER — HYDROCOD POLST-CHLORPHEN POLST 10-8 MG/5ML PO LQCR
5.0000 mL | Freq: Two times a day (BID) | ORAL | Status: DC | PRN
  Administered 2014-07-09: 5 mL via ORAL
  Filled 2014-07-09 (×2): qty 5

## 2014-07-09 MED ORDER — INSULIN DETEMIR 100 UNIT/ML ~~LOC~~ SOLN
18.0000 [IU] | Freq: Every day | SUBCUTANEOUS | Status: DC
  Administered 2014-07-09 – 2014-07-14 (×6): 18 [IU] via SUBCUTANEOUS
  Filled 2014-07-09 (×7): qty 0.18

## 2014-07-09 MED ORDER — IPRATROPIUM-ALBUTEROL 0.5-2.5 (3) MG/3ML IN SOLN
3.0000 mL | Freq: Four times a day (QID) | RESPIRATORY_TRACT | Status: DC
  Administered 2014-07-09 – 2014-07-10 (×6): 3 mL via RESPIRATORY_TRACT
  Filled 2014-07-09 (×7): qty 3

## 2014-07-09 MED ORDER — AZITHROMYCIN 500 MG PO TABS
500.0000 mg | ORAL_TABLET | ORAL | Status: AC
  Administered 2014-07-09 – 2014-07-13 (×5): 500 mg via ORAL
  Filled 2014-07-09 (×5): qty 1

## 2014-07-09 NOTE — Progress Notes (Signed)
Subjective: Afebrile overnight, with continued intermittent tachy to 102, and tachypnea to 25; however, overall vitals seem to be improving. One incidence of desaturation to 87% yesterday afternoon with sats of 91-97% otherwise in the afternoon. Now satting in the high 90s-100 on room air.   Increased losartan to 50 mg BID yesterday as per previous heart failure clinic note; BP seems to have improved some overnight, perhaps due to improved pain control. Tylenol x1 overnight, and no ibuprofen used. Increased insulin detemir from 15 to 18 units QHS.   Patient reports improved SOB, but continued cough with chest pain in central and right lateral chest with coughing. Has been eating and drinking well and is out of bed to get to the bathroom.   Objective: Vital signs in last 24 hours: Temp:  [98.1 F (36.7 C)-99.9 F (37.7 C)] 98.2 F (36.8 C) (03/31 0753) Pulse Rate:  [86-113] 86 (03/31 0753) Resp:  [13-34] 13 (03/31 0753) BP: (113-165)/(64-122) 122/79 mmHg (03/31 0753) SpO2:  [9 %-98 %] 98 % (03/31 0758) Weight:  [83.5 kg (184 lb 1.4 oz)] 83.5 kg (184 lb 1.4 oz) (03/31 0500)  Weight change: -5.405 kg (-11 lb 14.7 oz)  Intake/Output Summary (Last 24 hours) at 07/09/14 0803 Last data filed at 07/09/14 0700  Gross per 24 hour  Intake   2031 ml  Output   2550 ml  Net   -519 ml   BP 122/79 mmHg  Pulse 86  Temp(Src) 98.2 F (36.8 C) (Oral)  Resp 13  Ht 5\' 5"  (1.651 m)  Wt 83.5 kg (184 lb 1.4 oz)  BMI 30.63 kg/m2  SpO2 98% General appearance: Lying in bed in no acute distress Lungs: No increased work of breathing, but is pausing to breath during normal speech. Clear to auscultation over the right lung fields, continued diminished breath sounds over left lung fields with interval improvement Heart: Regular rate and rhythm, no murmurs.  Abdomen: Normal active bowel sounds. Soft, non-tender.  Extremities: No lower extremity edema.  Pulses: Radial pulses 2+ bilaterally.    Lab  Results: CBC    Component Value Date/Time   WBC 10.2 07/09/2014 0442   RBC 4.05* 07/09/2014 0442   HGB 13.4 07/09/2014 0442   HCT 39.4 07/09/2014 0442   PLT 122* 07/09/2014 0442   MCV 97.3 07/09/2014 0442   MCH 33.1 07/09/2014 0442   MCHC 34.0 07/09/2014 0442   RDW 13.6 07/09/2014 0442   LYMPHSABS 1.3 07/08/2014 1114   MONOABS 0.8 07/08/2014 1114   EOSABS 0.1 07/08/2014 1114   BASOSABS 0.0 07/08/2014 1114   CMP     Component Value Date/Time   NA 132* 07/09/2014 0442   K 4.2 07/09/2014 0442   CL 97 07/09/2014 0442   CO2 27 07/09/2014 0442   GLUCOSE 230* 07/09/2014 0442   BUN 12 07/09/2014 0442   CREATININE 1.16 07/09/2014 0442   CALCIUM 8.8 07/09/2014 0442   PROT 7.7 07/08/2014 0320   ALBUMIN 2.8* 07/08/2014 0320   AST 29 07/08/2014 0320   ALT 33 07/08/2014 0320   ALKPHOS 77 07/08/2014 0320   BILITOT 0.7 07/08/2014 0320   GFRNONAA 79* 07/09/2014 0442   GFRAA >90 07/09/2014 0442   CD4 count is 470  HbA1c: 8.6 (9.7 from 7 months ago)  LDH  251   Pending: RVP, influenza, legionella urinary antigen, sputum culture, sputum DFA  Medications:  Scheduled Meds: . azithromycin  500 mg Intravenous Q24H  . carvedilol  12.5 mg Oral BID WC  . cefTRIAXone (  ROCEPHIN)  IV  1 g Intravenous Q24H  . dolutegravir  50 mg Oral Daily  . emtricitabine-tenofovir  1 tablet Oral Daily  . enoxaparin (LOVENOX) injection  40 mg Subcutaneous Q24H  . [START ON 07/10/2014] furosemide  40 mg Oral Once  . hydrALAZINE  50 mg Oral 3 times per day  . insulin aspart  0-9 Units Subcutaneous TID WC  . insulin detemir  18 Units Subcutaneous QHS  . ipratropium-albuterol  3 mL Nebulization Q6H  . isosorbide mononitrate  30 mg Oral Daily  . losartan  50 mg Oral BID   Continuous Infusions:  PRN Meds:.acetaminophen, guaiFENesin-dextromethorphan, ibuprofen   Assessment/Plan:Shaun Brennan is a 39 yo male with a PMH significant for HIV, DM, HTN, and HFrEF who is admitted for further evaluation of SOB,  hemoptysis, cough, hypoxia, and CXR with LLL consolidation c/w CAP.   Principal Problem:   Acute respiratory distress Active Problems:   DM2 (diabetes mellitus, type 2)   Chronic systolic heart failure   Community acquired pneumonia   CAP (community acquired pneumonia)  ** SOB, hemoptysis, pleuritic chest pain, hypoxia: Most likely etiology is CAP. With his history of immunosuppression 2/2 HIV, the differential for etiologies is broad. Although PCP is certainly a consideration, he appears to be improving with current antibiotic regimen, and current CD4 is 470.  -- Now on day 2 of IV ceftriaxone and azithromycin  -- Follow up blood cultures with no growth x1 day  -- Streptococcus pneumoniae urinary antigen negative -- Follow-up pending legionella urinary antigen, sputum culture with gram stain, respiratory viral panel, and sputum DFA. -- Will d/c robitussin DM (guafenisen - dextromethorphan)  and start guaifenesin and tussionex (chlorpheniramine-hydrocodone) to improve cough.  -- Tylenol and ibuprofen PRN for fever and pain, duonebs.  -- Will titrate supplemental oxygen to maintain O2 >92%; currently on room air.   ** HIV Infection: Followed with Dr. Ninetta Lights with ID and on truvada and tivicay with uncertain medication compliance. Last CD4 of 270 and viral load 10,387 in August of 2015. CD4 count this admission is 470, suggesting improved with initiation of HAART since last admission.  -- Continue home antiretrovirals  ** HFrEF, HTN: History of malignant HTN and non-ischemic cardiomyopathy felt to be most likely due to HTN and HIV. Clinically euvolemic on exam, though BNP was elevated on admission. Hypertensive during this admission with pressures in 140s-160s/70s-120s, though this was in the context of possible medication noncompliance prior to admission. With home regimen consistent during hospitalization and increase of losartan to 50 mg BID yesterday, he has had improved control in  120s-140s/70s-80s overnight.  -- Continue home heart failure and HTN meds including carvedilol, furosemide, hydralazine, imdur, and losartan. -- Increased losartan to 50 mg BID 3/30 as per heart failure clinic note.    ** Diabetes, type 2: Last A1c was August '15 at 9.7. With metformin at home, A1c during this admission is now 8.6.  -- Increased insulin determir to 18 units QHS -- SSI  ** FEN/GI:  -- Modified carb diet   ** PPx:  -- subcu lovenox   ** Dispo:  -- Full code -- Will transition from stepdown for floor status today; currently still on IV antibiotics, anticipated discharge in 1-2 days -- Social work consult  This is a Psychologist, occupational Note.  The care of the patient was discussed with Dr. Senaida Ores and the assessment and plan formulated with their assistance.  Please see their attached note for official documentation of the daily encounter.  LOS: 2 days   Clide Cliff, Med Student 07/09/2014, 8:03 AM

## 2014-07-09 NOTE — Progress Notes (Signed)
Subjective:  Patient was seen and examined this morning. Patient continues to have a productive cough with hemoptysis and pain when he coughs and takes a deep breath. Cough has minimally improved. He continues to have chills and diaphoresis. Shortness of breath is improving. He denies nausea or vomiting, but does admits to watery diarrhea since admission.   Objective: Vital signs in last 24 hours: Filed Vitals:   07/09/14 0500 07/09/14 0753 07/09/14 0758 07/09/14 0900  BP:  122/79    Pulse:  86  84  Temp:  98.2 F (36.8 C)    TempSrc:  Oral    Resp:  13  16  Height:      Weight: 184 lb 1.4 oz (83.5 kg)     SpO2:  97% 98% 98%   Weight change: -11 lb 14.7 oz (-5.405 kg)  Intake/Output Summary (Last 24 hours) at 07/09/14 1007 Last data filed at 07/09/14 0900  Gross per 24 hour  Intake   1911 ml  Output   2550 ml  Net   -639 ml   General: Vital signs reviewed.  Patient is well-developed and well-nourished, in no acute distress and cooperative with exam.  Cardiovascular: Regular rate, regular rhythm. Pulmonary/Chest: Decreased breath sounds on left, but no wheezing, rhonchi or rales.  Abdominal: Soft, non-tender, non-distended, BS + Extremities: No lower extremity edema bilaterally Neurological: A&O x3 Skin: Warm, dry and intact. No rashes or erythema. Psychiatric: Normal mood and affect. speech and behavior is normal. Cognition and memory are normal.   Lab Results: Basic Metabolic Panel:  Recent Labs Lab 07/08/14 0320 07/09/14 0442  NA 132* 132*  K 4.0 4.2  CL 96 97  CO2 28 27  GLUCOSE 158* 230*  BUN 10 12  CREATININE 1.01 1.16  CALCIUM 8.5 8.8   Liver Function Tests:  Recent Labs Lab 07/07/14 1224 07/08/14 0320  AST 46* 29  ALT 46 33  ALKPHOS 100 77  BILITOT 0.7 0.7  PROT 7.9 7.7  ALBUMIN 3.3* 2.8*   CBC:  Recent Labs Lab 07/08/14 1114 07/09/14 0442  WBC 12.1* 10.2  NEUTROABS 9.9*  --   HGB 13.8 13.4  HCT 40.0 39.4  MCV 96.9 97.3  PLT 116*  122*   CBG:  Recent Labs Lab 07/07/14 2138 07/08/14 0829 07/08/14 1218 07/08/14 1625 07/08/14 2048 07/09/14 0753  GLUCAP 332* 145* 193* 294* 280* 150*   Urine Drug Screen: Drugs of Abuse     Component Value Date/Time   LABOPIA NONE DETECTED 12/20/2013 1321   COCAINSCRNUR NONE DETECTED 12/20/2013 1321   LABBENZ NONE DETECTED 12/20/2013 1321   AMPHETMU NONE DETECTED 12/20/2013 1321   THCU POSITIVE* 12/20/2013 1321   LABBARB NONE DETECTED 12/20/2013 1321    Micro Results: Recent Results (from the past 240 hour(s))  Culture, blood (routine x 2)     Status: None (Preliminary result)   Collection Time: 07/07/14  2:24 PM  Result Value Ref Range Status   Specimen Description BLOOD LEFT ARM  Final   Special Requests BOTTLES DRAWN AEROBIC AND ANAEROBIC 10MLS  Final   Culture   Final           BLOOD CULTURE RECEIVED NO GROWTH TO DATE CULTURE WILL BE HELD FOR 5 DAYS BEFORE ISSUING A FINAL NEGATIVE REPORT Performed at [facility]    Report Status PENDING  Incomplete  Culture, blood (routine x 2)     Status: None (Preliminary result)   Collection Time: 07/07/14  2:30 PM  Result  Value Ref Range Status   Specimen Description BLOOD BLOOD LEFT FOREARM  Final   Special Requests BOTTLES DRAWN AEROBIC AND ANAEROBIC 10MLS  Final   Culture   Final           BLOOD CULTURE RECEIVED NO GROWTH TO DATE CULTURE WILL BE HELD FOR 5 DAYS BEFORE ISSUING A FINAL NEGATIVE REPORT Performed at [facility]    Report Status PENDING  Incomplete  MRSA PCR Screening     Status: None   Collection Time: 07/07/14  6:52 PM  Result Value Ref Range Status   MRSA by PCR NEGATIVE NEGATIVE Final    Comment:        The GeneXpert MRSA Assay (FDA approved for NASAL specimens only), is one component of a comprehensive MRSA colonization surveillance program. It is not intended to diagnose MRSA infection nor to guide or monitor treatment for MRSA infections.    Studies/Results: Dg Chest Port  1 View  07/07/2014   CLINICAL DATA:  39 year old male with chest pain and shortness of breath. Diabetic hypertensive smoker with HIV. Initial encounter.  EXAM: PORTABLE CHEST - 1 VIEW  COMPARISON:  12/20/2013.  FINDINGS: Significant consolidation left mid lower lobe suggestive of infectious infiltrate. Recommend followup until clearance.  Mild central pulmonary vascular prominence.  No pneumothorax.  Heart size top-normal.  IMPRESSION: Left mid to lower lobe prominent consolidation suggestive of infectious infiltrate. Recommend followup until clearance.  Mild central pulmonary vascular prominence.  These results were called by telephone at the time of interpretation on 07/07/2014 at 2:05 pm to Dr. Milton Ferguson , who verbally acknowledged these results.   Electronically Signed   By: Genia Del M.D.   On: 07/07/2014 14:05   Medications:  I have reviewed the patient's current medications. Prior to Admission:  Prescriptions prior to admission  Medication Sig Dispense Refill Last Dose  . carvedilol (COREG) 12.5 MG tablet Take 1 tablet (12.5 mg total) by mouth 2 (two) times daily with a meal. 60 tablet 3 07/06/2014 at 6p  . dolutegravir (TIVICAY) 50 MG tablet Take 1 tablet (50 mg total) by mouth daily. 30 tablet 5 07/07/2014 at Unknown time  . emtricitabine-tenofovir (TRUVADA) 200-300 MG per tablet Take 1 tablet by mouth daily. 30 tablet 5 07/07/2014 at Unknown time  . furosemide (LASIX) 40 MG tablet Take only on Mondays and Fridays 12 tablet 2 07/06/2014 at Unknown time  . glucose monitoring kit (FREESTYLE) monitoring kit 1 each by Does not apply route 4 (four) times daily - after meals and at bedtime. 1 month Diabetic Testing Supplies for QAC-QHS accuchecks. 1 each 1 Past Week at Unknown time  . hydrALAZINE (APRESOLINE) 50 MG tablet Take 1 tablet (50 mg total) by mouth every 8 (eight) hours. 90 tablet 3 07/06/2014 at 7p  . isosorbide mononitrate (IMDUR) 30 MG 24 hr tablet Take 1 tablet (30 mg total) by mouth  daily. 30 tablet 3 07/07/2014 at Unknown time  . losartan (COZAAR) 50 MG tablet Take 1 tablet (50 mg total) by mouth daily. 30 tablet 3 07/07/2014 at Unknown time  . metFORMIN (GLUCOPHAGE) 500 MG tablet Take 1 tablet (500 mg total) by mouth 2 (two) times daily with a meal. 60 tablet 0 07/07/2014 at Unknown time   Scheduled Meds: . azithromycin  500 mg Oral Q24H  . carvedilol  12.5 mg Oral BID WC  . cefTRIAXone (ROCEPHIN)  IV  1 g Intravenous Q24H  . dolutegravir  50 mg Oral Daily  . emtricitabine-tenofovir  1 tablet Oral Daily  . enoxaparin (LOVENOX) injection  40 mg Subcutaneous Q24H  . [START ON 07/10/2014] furosemide  40 mg Oral Once  . hydrALAZINE  50 mg Oral 3 times per day  . insulin aspart  0-9 Units Subcutaneous TID WC  . insulin detemir  18 Units Subcutaneous QHS  . ipratropium-albuterol  3 mL Nebulization Q6H  . isosorbide mononitrate  30 mg Oral Daily  . losartan  50 mg Oral BID   Continuous Infusions:  PRN Meds:. Assessment/Plan: Principal Problem:   Acute respiratory distress Active Problems:   DM2 (diabetes mellitus, type 2)   Chronic systolic heart failure   Community acquired pneumonia   CAP (community acquired pneumonia)  This is a 39 year old gentleman with past medical history of HIV infection, hypertension, CHF, and homelessness who presents with community-acquired pneumonia.  Community Acquired Pneumonia: Chest x-ray revealed a left mid lower lobar pneumonia. Patient has been afebrile for 24 hours. He has been satting well on room air, 96-97% this morning. Leukocytosis of 12.4>10.2 this morning, improving. He states short of breath is improving but he continues to have a productive cough with pain. LDH elevated at 389>251. CD4 count 470, making PCP less likely. We will transfer out of step down, continued IV and transition to oral antibiotics tomorrow. -Transfer out of step down unit -Continue Rocephin IV>>transition to oral tomorrow -Azithromycin 500 mg po  daily -Supplemental oxygen to maintain O2 >92% -Urine Legionella pending  -Blood cultures>> NGTD -Sputum culture and Gram stain pending -Respiratory virus panel pending -Pneumocystis smear by DFA -Flu panel pending -HIV RNA pending -Repeat CBC/BMET tomorrow am -Droplet precautions -Tussionex Q12H prn -Guaifenesin 200 mg Q4H prn  -Duonebs Q6H  -Tylenol 650 mg Q6H prn fever -Ibuprofen 600 mg Q6H prn pain from cough  HIV: CD4 270, viral load 10,387 on 11/16/2013. CD4 count 470 on 07/08/14. HIV viral load pending. On Truvada and Tivicay at home. Follows with Dr. Johnnye Sima with ID.  -Viral load pending -Tivicay 50 mg daily -Truvada 536-144 mg daily  Systolic CHF: Euvolemic. Last echo on 12/08/2013 showed EF 15-20%, grade II DD, mild/mod LA, RV normal. ECHO 04/27/2014 EF 25-30% RV mildy HK. Patient's home regimen includes furosemide 40 mg on Mondays and Fridays, hydralazine 50 mg every 8 hours, losartan 50 mg daily, and Imdur 30 mg daily. Most recent cardiology notes increased losartan to 50 mg BID. Will increase. -Continue with furosemide 40 mg on M/F -Hydralazine 50 mg TID -Losartan 50 mg BID -Imdur 30 mg daily  HTN: 147/87, much improved yesterday in the 110s-120s/70s after increase of losartan to 50 mg BID. Patient is on carvedilol 12.5 mg BID, lasix 40 mg MF, hydralazine 50 mg TID, losartan 50 mg daily and imdur 30 mg daily at home. Most recent cardiology notes increased losartan to 50 mg BID.  -Continue with furosemide 40 mg on M/F -Hydralazine 50 mg TID -Losartan 50 mg BID -Imdur 30 mg daily  T2DM: A1c on 07/08/14 8.6%. Patient is on metformin 500 mg twice a day at home. Glucose has been elevated in the 200s.  -Increase Levemir to 18 mg QHS -SSI-S -CBGs   Code Status: Full Code   F/E/N: Diet Carb Mod  VTE Ppx: Lovenox SQ QD  Family Communication: Discussed with the patient about his plan of care. Patient has no immediate family except his mom lives in [place], [place]  and is currently homeless. He understands that he might need ventilatory support if his respiratory status worsens and is agreeable.  Dispo: Disposition is deferred at this time, awaiting improvement of current medical problems.  Anticipated discharge in approximately 1-2 day(s).   The patient does have a current PCP (Lorayne Marek, MD) and does not need an [facility] hospital follow-up appointment after discharge.  The patient does not have transportation limitations that hinder transportation to clinic appointments.  .Services Needed at time of discharge: Y = Yes, Blank = No PT:   OT:   RN:   Equipment:   Other:     LOS: 2 days   Osa Craver, DO PGY-1 Internal Medicine Resident Pager # [phone] 07/09/2014 10:07 AM

## 2014-07-09 NOTE — Discharge Summary (Signed)
Name: Shaun Brennan MRN: [id number] DOB: 03-28-1976 39 y.o. PCP: Shaun Marek, MD  Date of Admission: 07/07/2014  1:11 PM Date of Discharge: 07/15/2014 Attending Physician: Shaun Contes, MD  Discharge Diagnosis: Principal Problem: Community Acquired PNA  Active Problems: DM2 (diabetes mellitus, type 2) Chronic systolic heart failure Depression, major, single episode, moderate Suicidal Ideation    Discharge Medications:   Medication List    TAKE these medications        carvedilol 6.25 MG tablet  Commonly known as:  COREG  Take 3 tablets (18.75 mg total) by mouth 2 (two) times daily with a meal.     dolutegravir 50 MG tablet  Commonly known as:  TIVICAY  Take 1 tablet (50 mg total) by mouth daily.     emtricitabine-tenofovir 200-300 MG per tablet  Commonly known as:  TRUVADA  Take 1 tablet by mouth daily.     escitalopram 10 MG tablet  Commonly known as:  LEXAPRO  Take 1 tablet (10 mg total) by mouth at bedtime.     furosemide 40 MG tablet  Commonly known as:  LASIX  Take only on Mondays and Fridays     glucose monitoring kit monitoring kit  1 each by Does not apply route 4 (four) times daily - after meals and at bedtime. 1 month Diabetic Testing Supplies for QAC-QHS accuchecks.     hydrALAZINE 50 MG tablet  Commonly known as:  APRESOLINE  Take 1 tablet (50 mg total) by mouth every 8 (eight) hours.     isosorbide mononitrate 30 MG 24 hr tablet  Commonly known as:  IMDUR  Take 1 tablet (30 mg total) by mouth daily.     losartan 50 MG tablet  Commonly known as:  COZAAR  Take 1 tablet (50 mg total) by mouth daily.     metFORMIN 500 MG tablet  Commonly known as:  GLUCOPHAGE  Take 1 tablet (500 mg total) by mouth 2 (two) times daily with a meal.        Disposition and follow-up:   Shaun Brennan was discharged from [facility] in stable condition.  At the hospital follow up visit please address:  1.  Compliance with ART and HF  meds; current depression symptoms (any SI?) and compliance with lexapro-->has he been to [facility] or [facility] to establish care.    2.  Labs / imaging needed at time of follow-up: BMP, cbg  3.  Pending labs/ test needing follow-up: None.   Follow-up Appointments: Follow-up Information    Follow up with Shaun Bickers, MD. Go on 07/27/2014.   Specialty:  Cardiology   Why:  07/27/14 @ 10:40 AM   Contact information:   [place] [address] [phone]       Follow up with [facility]. Go in 1 day.   Specialty:  Behavioral Health   Why:  M-Th 8am-3:00pm walk-in only, medication managment, Outpatient psychiatric follow-up   Contact information:   [place] [address] [phone]       Follow up with [facility]. Go in 1 day.   Specialty:  Professional Counselor   Why:  counseling services, walk-in M-F 8:30am-12pm and 1pm-2:30pm   Contact information:   [place] [address] [phone]       Follow up with Shaun Marek, MD On 07/17/2014.   Specialty:  Internal Medicine   Why:  10:30AM   Contact information:  [place] [address] [phone]       Follow up with Shaun Rumpf, MD On 07/21/2014.   Specialty:  Infectious Diseases   Why:  4:15PM   Contact information:   [address] [place] [address] [phone]       Discharge Instructions: Discharge Instructions    (HEART FAILURE PATIENTS) Call MD:  Anytime you have any of the following symptoms: 1) 3 pound weight gain in 24 hours or 5 pounds in 1 week 2) shortness of breath, with or without a dry hacking cough 3) swelling in the hands, feet or stomach 4) if you have to sleep on extra pillows at night in order to breathe.    Complete by:  As directed      Call MD for:  extreme fatigue    Complete by:  As directed      Call MD for:  persistant dizziness or light-headedness     Complete by:  As directed      Diet - low sodium heart healthy    Complete by:  As directed      Discharge instructions    Complete by:  As directed   Please keep your follow-up appointments and take all medications as prescribed.     Increase activity slowly    Complete by:  As directed            Consultations: Treatment Team:  Rounding Lbcardiology, MD Shaun Finland, MD  Procedures Performed:  Dg Chest Port 1 View  07/07/2014   CLINICAL DATA:  39 year old male with chest pain and shortness of breath. Diabetic hypertensive smoker with HIV. Initial encounter.  EXAM: PORTABLE CHEST - 1 VIEW  COMPARISON:  12/20/2013.  FINDINGS: Significant consolidation left mid lower lobe suggestive of infectious infiltrate. Recommend followup until clearance.  Mild central pulmonary vascular prominence.  No pneumothorax.  Heart size top-normal.  IMPRESSION: Left mid to lower lobe prominent consolidation suggestive of infectious infiltrate. Recommend followup until clearance.  Mild central pulmonary vascular prominence.  These results were called by telephone at the time of interpretation on 07/07/2014 at 2:05 pm to Dr. Milton Brennan , who verbally acknowledged these results.   Electronically Signed   By: Shaun Brennan M.D.   On: 07/07/2014 14:05    2D Echo: N/A  Cardiac Cath: N/A  Admission HPI:  This is a 39 year old homeless gentleman with past medical history of systolic CHF, HIV infection, HTN and DM 2, who presents with worsening shortness of breath and cough for one week. Patient has been experiencing productive cough with yellow and blood stained sputum over the past several days, but last night his shortness of breath significantly worsened which prompted him to present to the ED. He endorses substernal chest pain which is worsened by cough. He also reports subjective fevers but has not checked his temperature at home. He generally feels very weak today. No nausea, vomiting. He has no  recent contact with sick persons. He denies lower extremity swelling, PND, or orthopnea.  Shaun Brennan, [facility] Course by problem list: Principal Problem:   Suicidal ideation Active Problems:   DM2 (diabetes mellitus, type 2)   Chronic systolic heart failure   Community acquired pneumonia   CAP (community acquired pneumonia)   Depression, major, single episode, moderate   Major depression   Community Acquired Pneumonia:  CXR showed left mid lower lobar pneumonia with initial leukocytosis saturating well on RA.  LDH elevated at  913>685. CD4 count 470, making PCP less likely.  Initially placed on rocephin and azithromycin and transitioned to po azithromycin and augmentin for a total of 7 days.  Cultures revealed no growth.  He received symptomatic tx as well.  RVP neg.  Strept, legionella, and PCP neg. Explained may have some residual cough for several weeks.  Has f/u with [facility].   Suicidal Ideations: Pt is homeless and expressed some SI to the CSW although without a plan.  Psychiatry recommended inpatient psych initially but after several additional visits, psychiatry felt he was not a candidate for inpatient.  He was advised to follow-up with [facility] or [facility].  He was given their contact information by the CSW.    HIV:  CD4 270, viral load 10,387 on 11/16/2013. CD4 count 470 on 07/08/14. Viral load on 07/07/14 60.  On Truvada and Tivicay at home. Follows with Dr. Johnnye Sima with ID.  Continued tivicay and truvada.  Has f/u with Dr. Johnnye Sima.    Chronic systolic CHF:  Euvolemic. Last echo on 12/08/2013 showed EF 15-20%, grade II DD, mild/mod LA, RV normal. ECHO 04/27/2014 EF 25-30% RV mildy HK. Patient's home regimen includes furosemide 40 mg on Mondays and Fridays, hydralazine 50 mg every 8 hours, losartan 50 mg daily, and Imdur 30 mg daily. Most recent cardiology notes increased losartan to 50 mg bid.  Consulted HF team during admission for 8 beats of VT and  increased his carvedilol to 18.21m bid.  Has f/u with HF clinic.    HTN:  Continued home meds. of hydralazine 50 mg TID, Losartan 50 mg BID, and Imdur 30 mg daily.    T2DM:  A1c on 07/08/14 8.6%. Patient is on metformin 500 mg twice a day at home. Glucose has been elevated in the 200s. Increased levemir to 18 units qhs.   [place]      Discharge Vitals:   BP 125/72 mmHg  Pulse 84  Temp(Src) 98.4 F (36.9 C) (Oral)  Resp 16  Ht _0  (1.651 m)  Wt 82.5 kg (181 lb 14.1 oz)  BMI 30.27 kg/m2  SpO2 95%  Discharge Labs:  No results found for this or any previous visit (from the past 24 hour(s)).  Signed: JJones Bales MD 07/16/2014, 9:33 PM    Services Ordered on Discharge: None Equipment Ordered on Discharge: None

## 2014-07-09 NOTE — Progress Notes (Signed)
Attempted to call report x 2.  Will try to call again later.

## 2014-07-10 DIAGNOSIS — I479 Paroxysmal tachycardia, unspecified: Secondary | ICD-10-CM

## 2014-07-10 DIAGNOSIS — F329 Major depressive disorder, single episode, unspecified: Secondary | ICD-10-CM

## 2014-07-10 LAB — CBC
HCT: 39.2 % (ref 39.0–52.0)
Hemoglobin: 13.2 g/dL (ref 13.0–17.0)
MCH: 32.8 pg (ref 26.0–34.0)
MCHC: 33.7 g/dL (ref 30.0–36.0)
MCV: 97.5 fL (ref 78.0–100.0)
Platelets: 133 10*3/uL — ABNORMAL LOW (ref 150–400)
RBC: 4.02 MIL/uL — ABNORMAL LOW (ref 4.22–5.81)
RDW: 13.7 % (ref 11.5–15.5)
WBC: 7.2 10*3/uL (ref 4.0–10.5)

## 2014-07-10 LAB — BASIC METABOLIC PANEL
ANION GAP: 7 (ref 5–15)
BUN: 14 mg/dL (ref 6–23)
CO2: 25 mmol/L (ref 19–32)
CREATININE: 0.9 mg/dL (ref 0.50–1.35)
Calcium: 8.9 mg/dL (ref 8.4–10.5)
Chloride: 102 mmol/L (ref 96–112)
GFR calc non Af Amer: >90 mL/min (ref >90)
Glucose, Bld: 100 mg/dL — ABNORMAL HIGH (ref 70–99)
POTASSIUM: 4.2 mmol/L (ref 3.5–5.1)
Sodium: 134 mmol/L — ABNORMAL LOW (ref 135–145)

## 2014-07-10 LAB — GLUCOSE, CAPILLARY
GLUCOSE-CAPILLARY: 125 mg/dL — AB (ref 70–99)
GLUCOSE-CAPILLARY: 225 mg/dL — AB (ref 70–99)
Glucose-Capillary: 257 mg/dL — ABNORMAL HIGH (ref 70–99)
Glucose-Capillary: 265 mg/dL — ABNORMAL HIGH (ref 70–99)

## 2014-07-10 LAB — LEGIONELLA ANTIGEN, URINE

## 2014-07-10 LAB — PNEUMOCYSTIS JIROVECI SMEAR BY DFA: Pneumocystis jiroveci Ag: NEGATIVE

## 2014-07-10 MED ORDER — AMOXICILLIN-POT CLAVULANATE 875-125 MG PO TABS
1.0000 | ORAL_TABLET | Freq: Two times a day (BID) | ORAL | Status: AC
  Administered 2014-07-10 – 2014-07-13 (×8): 1 via ORAL
  Filled 2014-07-10 (×10): qty 1

## 2014-07-10 NOTE — Clinical Social Work Psychosocial (Signed)
Clinical Social Work Department BRIEF PSYCHOSOCIAL ASSESSMENT 07/10/2014  Patient:  Shaun Brennan, Shaun Brennan     Account Number:  [id number]     Admit date:  07/07/2014  Clinical Social Worker:  Lovey Newcomer  Date/Time:  07/10/2014 04:05 PM  Referred by:  Physician  Date Referred:  07/10/2014 Referred for  Homelessness   Other Referral:   NA   Interview type:  Patient Other interview type:   Patient alert and oriented at time of assessment.    PSYCHOSOCIAL DATA Living Status:  ALONE Admitted from facility:   Level of care:   Primary support name:  NONE Primary support relationship to patient:  NONE Degree of support available:   Patient's support is very poor. He states that Valrie Hart with [facility] is one of his main supports.    CURRENT CONCERNS Current Concerns  Other - See comment   Other Concerns:   Patient has several issues that need to be addressed. Patient is homeless, does not have access to his HIV medications at this time, and also reports feeling depressed.    SOCIAL WORK ASSESSMENT / PLAN CSW met with patient at bedside to complete assessment. Patient has been homeless for the past 8 years. Patient initially presents with flat affect and is somewhat limited in his engagement with CSW. Patient reports that he has been living on the streets for the past eight years and has no family in this area. Patient reports that he currently does not have access to his HIV medications, but he is working on this with his bridge counselor [employer].    Patient is also seen by the CHF clinic where he receives assistance for some of his other medications. Patient requests bus passes, which have been provided to the patient. Patient declines shelter resources as he is aware of the shelters available in [place]. Patient began to suddenly cry when he described his current health concerns and his complex social situation. Patient made  statement like, "I can't do this anymore. I just want to give up. I'm tired of living like this.". CSW assessed patient for SI. Patient denies as suicidal ideation, plans, means, or intent at this time, but is statements are indicative of passive SI. CSW would suggest having psychiatry evaluate patient, and patient specifically requests this.    Lastly, CSW completed VI-SPDAT with patient and has submitted referral to partners ending homeless.   Assessment/plan status:  No Further Intervention Required Other assessment/ plan:   NONE   Information/referral to community resources:   CSW has made referral to partners ending homelessness and has provided patient with bus passes.    PATIENT'S/FAMILY'S RESPONSE TO PLAN OF CARE: Patient plans to DC back to his previous living environment once medically stable. Patient's mood is very low when discussing his plan of care as he feels that he has so much up against him. CSW provided emotional support to patient and encouraged patient to followup with his bridge counselor and partners ending homelessness.    Liz Beach MSW, [place], [place], [phone]

## 2014-07-10 NOTE — Progress Notes (Signed)
The patient had an 8 beat run of V. Tach.  He was asymptomatic.  His VS were stable and are charted.  MD text paged.

## 2014-07-10 NOTE — Progress Notes (Signed)
Inpatient Diabetes Program Recommendations  AACE/ADA: New Consensus Statement on Inpatient Glycemic Control (2013)  Target Ranges:  Prepandial:   less than 140 mg/dL      Peak postprandial:   less than 180 mg/dL (1-2 hours)      Critically ill patients:  140 - 180 mg/dL   Results for NORMAN, MONOHAN (MRN [id number]) as of 07/10/2014 07:44  Ref. Range 07/09/2014 07:53 07/09/2014 12:08 07/09/2014 16:20 07/09/2014 21:39  Glucose-Capillary Latest Range: 70-99 mg/dL 185 (H) 501 (H) 586 (H) 218 (H)   Diabetes history: DM 2 Outpatient Diabetes medications: Metformin 500 mg BID Current orders for Inpatient glycemic control: Levemir 18 units QHS, Novolog 0-9 units TID  Inpatient Diabetes Program Recommendations  Correction (SSI): Please order Novolog 0-5 units QHS for bedtime coverage.  Insulin - Meal Coverage: Glucose increased into the 300's yesterday around meal times. Please consider Novolog 4 units TID with meals for meal coverage in addition to correction scale if patient is consuming at least 50% of meals.  Thanks, Christena Deem RN, MSN, [facility] Inpatient Diabetes Coordinator Team Pager [phone]

## 2014-07-10 NOTE — Progress Notes (Signed)
Pt stated that he may not want 0200 HHN.

## 2014-07-10 NOTE — Progress Notes (Signed)
Patient seen and examined. Case d/w residents in detail.  Patient still complains of persistent cough but improving SOB. No fevers. He was noted to have 8 beats of NSVT on telemetry last night.  Physical Exam: Gen: AAO*3, NAD CVS: RRR, normal heart sounds Lungs: CTA b/l Abd: soft, non tender, BS + Ext: no edema  Assessment and Plan: 39 y/o male with PMH of HIV and DM who is admitted with cough, SOB and found to have CAP. Will d/c IV abx today and start PO augmentin and azithromycin to complete course of abx. Cultures are negative till date. Will follow up. Will continue with nebs, O2 prn, guaifenesin.  Patient with episode of NSVT.  Outpatient f/u with Dr. Gala Romney.  Patient to f/u with Dr. Ninetta Lights on 4/12. C/w anti retrovirals  Patient with epsiode of depression without suicidal ideation per SW consult. Will get psych to eval in AM

## 2014-07-10 NOTE — Progress Notes (Signed)
Subjective: Overnight, telemetry showed 8 beats of ventricular tachycardia, lasting ~5.2 seconds at a heart rate of 82 at the time. Per nursing, he was asymptomatic at the time.   Otherwise, the patient has been afebrile, with normal vitals aside from one reading to tachycardia to 102 and tachypnea to 25. He has been satting well on room air.   The patient reports that his SOB is roughly the same, and continues to complain of cough with associated pleuritic chest pain. He is concerned about making sure that he sees social work before discharge.   Objective: Vital signs in last 24 hours: Filed Vitals:   07/10/14 0436 07/10/14 0504 07/10/14 0644 07/10/14 0718  BP: 152/98  152/96   Pulse: 80  87   Temp: 98 F (36.7 C)     TempSrc: Oral     Resp: 18     Height:      Weight:  84.6 kg (186 lb 8.2 oz)    SpO2: 97%   96%   Weight change: 1.1 kg (2 lb 6.8 oz)  Intake/Output Summary (Last 24 hours) at 07/10/14 1129 Last data filed at 07/10/14 0646  Gross per 24 hour  Intake   1130 ml  Output   1150 ml  Net    -20 ml   BP 152/96 mmHg  Pulse 87  Temp(Src) 98 F (36.7 C) (Oral)  Resp 18  Ht  (1.651 m)  Wt 84.6 kg (186 lb 8.2 oz)  BMI 31.04 kg/m2  SpO2 96% General appearance: Alert, lying in bed in no acute distress.  Lungs: Continued diminished breath sounds over left lung fields. No wheezing or crackles heard.  Heart: Regular rate and rhythm, no murmurs.    Lab Results: CBC    Component Value Date/Time   WBC 7.2 07/10/2014 0600   RBC 4.02* 07/10/2014 0600   HGB 13.2 07/10/2014 0600   HCT 39.2 07/10/2014 0600   PLT 133* 07/10/2014 0600   MCV 97.5 07/10/2014 0600   MCH 32.8 07/10/2014 0600   MCHC 33.7 07/10/2014 0600   RDW 13.7 07/10/2014 0600   LYMPHSABS 1.3 07/08/2014 1114   MONOABS 0.8 07/08/2014 1114   EOSABS 0.1 07/08/2014 1114   BASOSABS 0.0 07/08/2014 1114   BMET    Component Value Date/Time   NA 134* 07/10/2014 0600   K 4.2 07/10/2014 0600   CL 102  07/10/2014 0600   CO2 25 07/10/2014 0600   GLUCOSE 100* 07/10/2014 0600   BUN 14 07/10/2014 0600   CREATININE 0.90 07/10/2014 0600   CALCIUM 8.9 07/10/2014 0600   GFRNONAA >90 07/10/2014 0600   GFRAA >90 07/10/2014 0600   Micro Results:  Reviewed in EPIC. Please see chart for details.   Medications:  Scheduled Meds: . azithromycin  500 mg Oral Q24H  . carvedilol  12.5 mg Oral BID WC  . dolutegravir  50 mg Oral Daily  . emtricitabine-tenofovir  1 tablet Oral Daily  . enoxaparin (LOVENOX) injection  40 mg Subcutaneous Q24H  . hydrALAZINE  50 mg Oral 3 times per day  . insulin aspart  0-9 Units Subcutaneous TID WC  . insulin detemir  18 Units Subcutaneous QHS  . ipratropium-albuterol  3 mL Nebulization Q6H  . isosorbide mononitrate  30 mg Oral Daily  . losartan  50 mg Oral BID   Continuous Infusions:  PRN Meds:.acetaminophen, chlorpheniramine-HYDROcodone, guaiFENesin, ibuprofen   Assessment/Plan: Shaun Brennan is a 39 yo male with a PMH significant for HIV, DM, HTN, and HFrEF  who is admitted for further evaluation of SOB, hemoptysis, cough, hypoxia, and CXR with LLL consolidation c/w CAP.   Principal Problem:   Acute respiratory distress Active Problems:   DM2 (diabetes mellitus, type 2)   Chronic systolic heart failure   Community acquired pneumonia   CAP (community acquired pneumonia)  ** CAP: Initially presented with SOB, cough, hemoptysis, and pleuritic chest pain. Precipitating factors likely include tobacco use and immunosuppression 2/2 HIV with current CD4 count of 470. Now s/p 3 days of IV ceftriaxone and oral azithromycin with significant clinical improvement in fevers and initial hypoxia.  -- Will transition from IV ceftriaxone to oral augmentin today. Will continue azithromycin.  -- Follow up blood cultures, pending legionella urinary antigen, and respiratory viral panel. -- Streptococcus pneumoniae urinary antigen negative -- Sputum DFA negative -- Respiratory  culture with abundant WBCs with PMN predominance, few gram positive cocci in pairs, chains, and clusters, and rare gram positive rods; most likely mixed oropharyngeal flora  -- Influenza panel negative -- Continue symptomatic care with guaifenesin and tussionex (chlorpheniramine-hydrocodone), tylenol and ibuprofen, and duonebs -- Will titrate supplemental oxygen to maintain O2 >92%; currently on room air.   ** Asymptomatic ventricular tachycardia: Appears to be asymptomatic non-sustained ventricular tachycardia; however, heart rate at the time was 82; irregular rhythm with v-tach did have rate up to 100. Unclear of value of possible ICD in the setting of probable NICM.  -- Will touch base with Dr. Gala Romney today   ** HIV Infection: Followed with Dr. Ninetta Lights with ID and on truvada and tivicay with uncertain medication compliance. Last CD4 of 270 and viral load 10,387 in August of 2015. CD4 count this admission is 470, suggesting improved with initiation of HAART since last admission.  -- Continue home antiretrovirals -- F/u scheduled with Dr. Ninetta Lights on 4/12  ** HFrEF, HTN: History of malignant HTN and non-ischemic cardiomyopathy felt to be most likely due to HTN and HIV. Clinically euvolemic on exam, though BNP was elevated on admission. Improved control with home hypertensive medications, and increase in losartan to BID dosing.  -- Continue home heart failure and HTN meds including carvedilol, furosemide, hydralazine, imdur, and losartan. -- Increased losartan to 50 mg BID 3/30 as per heart failure clinic note.   ** Diabetes, type 2: Last A1c was August '15 at 9.7. With metformin at home, A1c during this admission is now 8.6.  -- Insulin determir to 18 units QHS -- SSI  ** FEN/GI:  -- Modified carb diet   ** PPx:  -- Subcu lovenox   ** Dispo:  -- Full code -- Transitioning from IV to oral antibiotics today, anticipate discharge in 1 day -- Social work consult -- F/u with PCP Dr.  Orpah Cobb on 4/8  This is a Psychologist, occupational Note.  The care of the patient was discussed with Dr. Delane Ginger and the assessment and plan formulated with their assistance.  Please see their attached note for official documentation of the daily encounter.   LOS: 3 days   Clide Cliff, Med Student 07/10/2014, 11:29 AM  Pgr: [phone]

## 2014-07-11 DIAGNOSIS — F321 Major depressive disorder, single episode, moderate: Secondary | ICD-10-CM | POA: Diagnosis present

## 2014-07-11 LAB — GLUCOSE, CAPILLARY
GLUCOSE-CAPILLARY: 195 mg/dL — AB (ref 70–99)
GLUCOSE-CAPILLARY: 310 mg/dL — AB (ref 70–99)
GLUCOSE-CAPILLARY: 315 mg/dL — AB (ref 70–99)
Glucose-Capillary: 261 mg/dL — ABNORMAL HIGH (ref 70–99)

## 2014-07-11 LAB — RESPIRATORY VIRUS PANEL
Adenovirus: NEGATIVE
INFLUENZA A: NEGATIVE
INFLUENZA B 1: NEGATIVE
METAPNEUMOVIRUS: NEGATIVE
PARAINFLUENZA 2 A: NEGATIVE
Parainfluenza 1: NEGATIVE
Parainfluenza 3: NEGATIVE
Respiratory Syncytial Virus A: NEGATIVE
Respiratory Syncytial Virus B: NEGATIVE
Rhinovirus: NEGATIVE

## 2014-07-11 MED ORDER — IPRATROPIUM-ALBUTEROL 0.5-2.5 (3) MG/3ML IN SOLN: 3.0000 mL | Freq: Four times a day (QID) | RESPIRATORY_TRACT | Status: DC | PRN

## 2014-07-11 MED ORDER — ESCITALOPRAM OXALATE 10 MG PO TABS
10.0000 mg | ORAL_TABLET | Freq: Every day | ORAL | Status: DC
  Administered 2014-07-11 – 2014-07-14 (×4): 10 mg via ORAL
  Filled 2014-07-11 (×5): qty 1

## 2014-07-11 MED ORDER — INSULIN ASPART 100 UNIT/ML ~~LOC~~ SOLN
4.0000 [IU] | Freq: Once | SUBCUTANEOUS | Status: AC
  Administered 2014-07-11: 4 [IU] via SUBCUTANEOUS

## 2014-07-11 MED ORDER — HYDROCHLOROTHIAZIDE 12.5 MG PO CAPS: 12.5000 mg | ORAL_CAPSULE | Freq: Every day | ORAL | Status: DC

## 2014-07-11 NOTE — Progress Notes (Signed)
Pt assessed and found to no longer be in need of Q 6 hour HHN; BS clear, rr 16, NPC, Room air SAT 98%, No home resp meds. Pleaswe see additional assessment under Protocol Assessment section.

## 2014-07-11 NOTE — Progress Notes (Signed)
Subjective: Shaun Brennan. Social work spoke with patient and became concerned about possible suicidality. Psychiatry consult was called. This AM, the patient became tearful when speaking about his life, stating that he just doesn't want to live like this anymore. When asked if he had thoughts of hurting himself, states, "I do and I don't." He thinks he won't act on these thoughts. He states that he has felt like this before and when he had thoughts of ending his life, he presented to the hospital and told them about these thoughts. He says after speaking with people over the telephone, he felt better after a few days with no medications or formal psychotherapy. He thinks he may benefit from antidepressants now. He thinks he would be able to let someone know if he had thoughts of ending his life again. He would like to speak with social work again today.   He declines resources for tobacco sensation. States he is only a social smoker and doesn't think it/quitting is an issue for him.   Afebrile overnight, with O2 sats of 94-97% on room air. BP 160/92 this AM. He says his cough has improved from admission, but still has coughing spells that leave him short of breath. Otherwise is not short of breath.   Objective: Vital signs in last 24 hours: Filed Vitals:   07/10/14 1311 07/10/14 1406 07/10/14 2220 07/11/14 0527  BP: 139/73  139/97 160/92  Pulse: 95  92   Temp: 98.5 F (36.9 C)  98.7 F (37.1 C) 98.4 F (36.9 C)  TempSrc:   Oral Oral  Resp: Height:      Weight:    87 kg (191 lb 12.8 oz)  SpO2: 94% 96% 98% 97%   Weight change: 2.4 kg (5 lb 4.7 oz)  Intake/Output Summary (Last 24 hours) at 07/11/14 0727 Last data filed at 07/11/14 0528  Gross per 24 hour  Intake   1080 ml  Output   1600 ml  Net   -520 ml   General: Lying in bed sleeping, awakens easily.  Pulm: Improved with clear lung fields bilaterally.  CV: Regular rate and rhythm, no murmurs.  Abdominal: Normal active bowel  sounds. Soft, non-tender.  Extremities: No lower extremity edema.  Psych: Intermittently tearful. Makes good eye contact, converses appropriately with interviewer, thought content appears linear.   Lab Results: CBG 195 this AM.   Medications:  Scheduled Meds: . amoxicillin-clavulanate  1 tablet Oral Q12H  . azithromycin  500 mg Oral Q24H  . carvedilol  12.5 mg Oral BID WC  . dolutegravir  50 mg Oral Daily  . emtricitabine-tenofovir  1 tablet Oral Daily  . enoxaparin (LOVENOX) injection  40 mg Subcutaneous Q24H  . hydrALAZINE  50 mg Oral 3 times per day  . insulin aspart  0-9 Units Subcutaneous TID WC  . insulin detemir  18 Units Subcutaneous QHS  . isosorbide mononitrate  30 mg Oral Daily  . losartan  50 mg Oral BID   Continuous Infusions:  PRN Meds:.acetaminophen, chlorpheniramine-HYDROcodone, guaiFENesin, ibuprofen, ipratropium-albuterol   Assessment/Plan: Mr. Cookston is a 39 yo male with a PMH significant for HIV, DM, HTN, and HFrEF who is admitted for further evaluation of SOB, hemoptysis, cough, hypoxia, and CXR with LLL consolidation c/w CAP.   Principal Problem:   CAP (community acquired pneumonia) Active Problems:   DM2 (diabetes mellitus, type 2)   Chronic systolic heart failure   Community acquired pneumonia  ** CAP: Initially presented with SOB, cough,  hemoptysis, and pleuritic chest pain. Precipitating factors likely include tobacco use and immunosuppression 2/2 HIV with current CD4 count of 470. Now s/p 3 days of IV ceftriaxone and oral azithromycin with significant clinical improvement in fevers and initial hypoxia. Previously treated with IV ceftriaxone and azithromycin.  -- Now on oral azithromycin and augmentin (day 4/7) -- Follow up blood cultures, pending sputum culture, and respiratory viral panel. -- Streptococcus pneumoniae urinary antigen negative -- Sputum DFA negative, influenza panel negative, legionella urinary antigen negative -- Respiratory culture  with abundant WBCs with PMN predominance, few gram positive cocci in pairs, chains, and clusters, and rare gram positive rods; most likely mixed oropharyngeal flora  -- Continue symptomatic care with guaifenesin and tussionex (chlorpheniramine-hydrocodone), tylenol and ibuprofen, and duonebs -- Will titrate supplemental oxygen to maintain O2 >92%; currently on room air.   ** Asymptomatic ventricular tachycardia: Overnight on 4/1 with asymptomatic non-sustained ventricular tachycardia; however, heart rate at the time was 82; irregular rhythm with v-tach did have rate up to 100. Overnight on 4/2 had additional episodes of PVCs, as well as reported non-sustained V-tach lasting and v-tach on the tele. Unclear of value of possible ICD in the setting of probable NICM.  -- Will need follow-up with Dr. Gala Romney.  ** HIV Infection: Followed with Dr. Ninetta Lights with ID and on truvada and tivicay with uncertain medication compliance. Last CD4 of 270 and viral load 10,387 in August of 2015. CD4 count this admission is 470 and viral load 60, suggesting improvement with initiation of HAART since last admission.  -- Continue home antiretrovirals -- F/u scheduled with Dr. Ninetta Lights on 4/12  ** Passive suicidality: Endorses some thoughts of self-harm, but does not appear to have an active plan. Psychiatry involvement and anti-depressants would likely be helpful. Will need to consider drug drug interactions between SSRIs and HIV medications.  -- Psychiatry consult today -- Chaplain consult today  ** HFrEF, HTN: History of malignant HTN and non-ischemic cardiomyopathy felt to be most likely due to HTN and HIV. Clinically euvolemic on exam, though BNP was elevated on admission. Improved control with home hypertensive medications, and increase in losartan to BID dosing.  -- Continue home heart failure and HTN meds including carvedilol, furosemide, hydralazine, imdur, and losartan.  ** Diabetes, type 2: Last A1c was  August '15 at 9.7. With metformin at home, A1c during this admission is now 8.6.  -- Insulin determir 18 units QHS -- SSI  ** FEN/GI:  -- Modified carb diet   ** PPx:  -- Subcu lovenox   ** Dispo:  -- Full code -- Social work consult -- F/u with PCP Dr. Orpah Cobb on 4/8 -- F/u with Dr. Ninetta Lights w/ ID on 4/12 -- Needs cardiology f/u  This is a Medical Student Note.  The care of the patient was discussed with Dr. Delane Ginger and the assessment and plan formulated with their assistance.  Please see their attached note for official documentation of the daily encounter.   LOS: 4 days   Clide Cliff, Med Student 07/11/2014, 7:27 AM  [phone]

## 2014-07-11 NOTE — Progress Notes (Signed)
Pt has HS blood sugar 315 mg/dl. Pt is given 18 units of scheduled  Lantus. On-call provider paged and  order received for additional 4 units of insulin novolog . Will monitor.

## 2014-07-11 NOTE — Consult Note (Signed)
[facility] Consult   Reason for Consult:  Depression, suicidal thoughts Referring Physician:  Dr. Dareen Piano Patient Identification: Shaun Brennan MRN:  [id number] Principal Diagnosis: Major depressive disorder single episode moderate Diagnosis:   Patient Active Problem List   Diagnosis Date Noted  . Depression, major, single episode, moderate [F32.1] 07/11/2014    Priority: High  . Community acquired pneumonia [J18.9] 07/07/2014  . CAP (community acquired pneumonia) [J18.9] 07/07/2014  . Chronic systolic heart failure [Y07.37] 12/17/2013  . Diabetes mellitus due to underlying condition without complications [T06.2] 69/48/5462  . Smoking [Z72.0] 12/12/2013  . Acute on chronic systolic heart failure [V03.50] 12/09/2013  . Acute respiratory distress [J80] 12/06/2013  . 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 DM2 (diabetes mellitus, type 2) [E11.9] 12/06/2013  . HIV (human immunodeficiency virus infection) [Z21] 12/06/2013  . H/O CHF [Z86.79] 12/06/2013  . Cardiomegaly: per cxr 12/06/13 [I51.7] 12/06/2013    Total Time spent with patient: 1 hour  Subjective:   Shaun Brennan is a 39 y.o. male patient admitted with cough and shortness of breath.  HPI:  Patient with history of Hiv, Hypertension and diabetes, CHF who was admitted for shortness of breath and cough. Patient denies prior history of mental illness but reports suicidal thoughts during this admission.  Patient had talked to the unit social worker about recurrent thoughts of suicide. Patient reports that he has been feeling depressed for months but never told anyone. During the interview,  he became tearful when speaking about his life, stating that he just doesn't want to live like this anymore; "I want to live normal life like everyone else, get marries, have children and be able to do what normal people do.'' He is reporting recurrent suicidal thoughts but has no plan, "says I have no balls to kill myself.''  Patient states that he is homeless, helpless, hopeless,has low energy level and lack of motivation. He states that he has been stressed out and frustrated due to financial issues. He states that he had applied for disability but does not feel it will be approved. Patient denies psychosis or delusional thinking and agreed to be started on an antidepressant. He denies drugs use but reports occasional use of alcohol and Marijuana.   HPI Elements:   Location:  depression, suicidal thoughts. Quality:  moderate to severe. Duration:  for months. Context:  being homeless with no money.  Past Medical History:  Past Medical History  Diagnosis Date  . HIV (human immunodeficiency virus infection)   . Diabetes mellitus without complication   . Hypertension   . Chronic systolic heart failure     a. EF 15-20%, grade II DD, LA mild/mod dilated  . DM2 (diabetes mellitus, type 2)     Past Surgical History  Procedure Laterality Date  . Ankle surgery Right    Family History:  Family History  Problem Relation Age of Onset  . Other Father   . Diabetes Mother   . Stroke Paternal Grandfather    Social History:  History  Alcohol Use  . Yes    Comment: socially about 6 pack per week      History  Drug Use  . Yes  . Special: Marijuana    History   Social History  . Marital Status: Single    Spouse Name: N/A  . Number of Children: N/A  . Years of Education: N/A   Social History Main Topics  . Smoking status: Current Every Day Smoker -- 0.25 packs/day    Types:  Cigarettes  . Smokeless tobacco: Never Used     Comment: about 10 cigarettes a week  . Alcohol Use: Yes     Comment: socially about 6 pack per week   . Drug Use: Yes    Special: Marijuana  . Sexual Activity: Yes     Comment: pt. given condoms   Other Topics Concern  . None   Social History Narrative   Currently homeless   Additional Social History:                          Allergies:   Allergies  Allergen  Reactions  . Lisinopril Cough    Labs:  Results for orders placed or performed during the hospital encounter of 07/07/14 (from the past 48 hour(s))  Glucose, capillary     Status: Abnormal   Collection Time: 07/09/14  4:20 PM  Result Value Ref Range   Glucose-Capillary 308 (H) 70 - 99 mg/dL  Glucose, capillary     Status: Abnormal   Collection Time: 07/09/14  9:39 PM  Result Value Ref Range   Glucose-Capillary 218 (H) 70 - 99 mg/dL   Comment 1 Notify RN    Comment 2 Document in Chart   CBC     Status: Abnormal   Collection Time: 07/10/14  6:00 AM  Result Value Ref Range   WBC 7.2 4.0 - 10.5 K/uL   RBC 4.02 (L) 4.22 - 5.81 MIL/uL   Hemoglobin 13.2 13.0 - 17.0 g/dL   HCT 39.2 39.0 - 52.0 %   MCV 97.5 78.0 - 100.0 fL   MCH 32.8 26.0 - 34.0 pg   MCHC 33.7 30.0 - 36.0 g/dL   RDW 13.7 11.5 - 15.5 %   Platelets 133 (L) 150 - 400 K/uL  Basic metabolic panel     Status: Abnormal   Collection Time: 07/10/14  6:00 AM  Result Value Ref Range   Sodium 134 (L) 135 - 145 mmol/L   Potassium 4.2 3.5 - 5.1 mmol/L   Chloride 102 96 - 112 mmol/L   CO2 25 19 - 32 mmol/L   Glucose, Bld 100 (H) 70 - 99 mg/dL   BUN 14 6 - 23 mg/dL   Creatinine, Ser 0.90 0.50 - 1.35 mg/dL   Calcium 8.9 8.4 - 10.5 mg/dL   GFR calc non Af Amer >90 >90 mL/min   GFR calc Af Amer >90 >90 mL/min    Comment: (NOTE) The eGFR has been calculated using the CKD EPI equation. This calculation has not been validated in all clinical situations. eGFR's persistently <90 mL/min signify possible Chronic Kidney Disease.    Anion gap 7 5 - 15  Glucose, capillary     Status: Abnormal   Collection Time: 07/10/14  7:56 AM  Result Value Ref Range   Glucose-Capillary 125 (H) 70 - 99 mg/dL  Glucose, capillary     Status: Abnormal   Collection Time: 07/10/14 12:15 PM  Result Value Ref Range   Glucose-Capillary 257 (H) 70 - 99 mg/dL  Glucose, capillary     Status: Abnormal   Collection Time: 07/10/14  4:23 PM  Result Value Ref  Range   Glucose-Capillary 225 (H) 70 - 99 mg/dL  Glucose, capillary     Status: Abnormal   Collection Time: 07/10/14 10:15 PM  Result Value Ref Range   Glucose-Capillary 265 (H) 70 - 99 mg/dL  Glucose, capillary     Status: Abnormal   Collection Time: 07/11/14  7:49 AM  Result Value Ref Range   Glucose-Capillary 195 (H) 70 - 99 mg/dL  Glucose, capillary     Status: Abnormal   Collection Time: 07/11/14 11:49 AM  Result Value Ref Range   Glucose-Capillary 310 (H) 70 - 99 mg/dL    Vitals: Blood pressure 113/67, pulse 90, temperature 98.6 F (37 C), temperature source Oral, resp. rate 18, height $RemoveBe'5\' 5"'tuvBuVBsx$  (1.651 m), weight 87 kg (191 lb 12.8 oz), SpO2 97 %.  Risk to Self: Is patient at risk for suicide?: No Risk to Others:   Prior Inpatient Therapy:   Prior Outpatient Therapy:    Current Facility-Administered Medications  Medication Dose Route Frequency Provider Last Rate Last Dose  . acetaminophen (TYLENOL) tablet 650 mg  650 mg Oral Q6H PRN Alexa Sherral Hammers, MD   650 mg at 07/09/14 2315  . amoxicillin-clavulanate (AUGMENTIN) 875-125 MG per tablet 1 tablet  1 tablet Oral Q12H Jones Bales, MD   1 tablet at 07/11/14 1012  . azithromycin (ZITHROMAX) tablet 500 mg  500 mg Oral Q24H Annia Belt, MD   500 mg at 07/10/14 2051  . carvedilol (COREG) tablet 12.5 mg  12.5 mg Oral BID WC Alexa Sherral Hammers, MD   12.5 mg at 07/11/14 0810  . chlorpheniramine-HYDROcodone (TUSSIONEX) 10-8 MG/5ML suspension 5 mL  5 mL Oral Q12H PRN Alexa Sherral Hammers, MD   5 mL at 07/09/14 1717  . dolutegravir (TIVICAY) tablet 50 mg  50 mg Oral Daily Jessee Avers, MD   50 mg at 07/11/14 1012  . emtricitabine-tenofovir (TRUVADA) 200-300 MG per tablet 1 tablet  1 tablet Oral Daily Jessee Avers, MD   1 tablet at 07/11/14 1012  . enoxaparin (LOVENOX) injection 40 mg  40 mg Subcutaneous Q24H Jessee Avers, MD   40 mg at 07/10/14 2051  . guaiFENesin tablet 200 mg  200 mg Oral Q4H PRN Alexa Sherral Hammers, MD    200 mg at 07/09/14 2319  . hydrALAZINE (APRESOLINE) tablet 50 mg  50 mg Oral 3 times per day Jessee Avers, MD   50 mg at 07/11/14 0544  . ibuprofen (ADVIL,MOTRIN) tablet 600 mg  600 mg Oral Q6H PRN Alexa Sherral Hammers, MD      . insulin aspart (novoLOG) injection 0-9 Units  0-9 Units Subcutaneous TID WC Jessee Avers, MD   7 Units at 07/11/14 1300  . insulin detemir (LEVEMIR) injection 18 Units  18 Units Subcutaneous QHS Alexa Sherral Hammers, MD   18 Units at 07/10/14 2308  . ipratropium-albuterol (DUONEB) 0.5-2.5 (3) MG/3ML nebulizer solution 3 mL  3 mL Nebulization Q6H PRN Nischal Narendra, MD      . isosorbide mononitrate (IMDUR) 24 hr tablet 30 mg  30 mg Oral Daily Jessee Avers, MD   30 mg at 07/11/14 1011  . losartan (COZAAR) tablet 50 mg  50 mg Oral BID Alexa Sherral Hammers, MD   50 mg at 07/11/14 1011    Musculoskeletal: Strength & Muscle Tone: within normal limits Gait & Station: normal Patient leans: N/A  Psychiatric Specialty Exam: Physical Exam  Psychiatric: His speech is normal. His mood appears anxious. He is slowed and withdrawn. Cognition and memory are normal. He expresses impulsivity. He exhibits a depressed mood. He expresses suicidal ideation.    Review of Systems  Constitutional: Positive for malaise/fatigue.  HENT: Negative.   Eyes: Negative.   Respiratory: Positive for cough and shortness of breath.   Cardiovascular: Negative.   Gastrointestinal: Negative.   Genitourinary: Negative.  Musculoskeletal: Negative.   Skin: Negative.   Neurological: Negative.   Endo/Heme/Allergies: Negative.   Psychiatric/Behavioral: Positive for depression, suicidal ideas and substance abuse. The patient is nervous/anxious.     Blood pressure 113/67, pulse 90, temperature 98.6 F (37 C), temperature source Oral, resp. rate 18, height 5\' 5"  (1.651 m), weight 87 kg (191 lb 12.8 oz), SpO2 97 %.Body mass index is 31.92 kg/(m^2).  General Appearance: Casual and dressed in hospital  gown  Eye Contact::  Good  Speech:  Clear and Coherent  Volume:  Decreased  Mood:  Depressed and Hopeless  Affect:  Tearful  Thought Process:  Goal Directed  Orientation:  Full (Time, Place, and Person)  Thought Content:  Negative  Suicidal Thoughts:  Yes.  without intent/plan  Homicidal Thoughts:  No  Memory:  Immediate;   Fair Recent;   Fair Remote;   Good  Judgement:  Poor  Insight:  Shallow  Psychomotor Activity:  Psychomotor Retardation  Concentration:  Fair  Recall:  Good  Fund of Knowledge:Good  Language: Good  Akathisia:  No  Handed:  Right  AIMS (if indicated):     Assets:  Communication Skills Desire for Improvement Physical Health  ADL's:  Intact  Cognition: WNL  Sleep:   poor   Medical Decision Making: Review or order clinical lab tests (1), Established Problem, Worsening (2) and Review of Medication Regimen & Side Effects (2)  Treatment Plan Summary: Daily contact with patient to assess and evaluate symptoms and progress in treatment.  Medication management: Lexapro 10mg  Qhs for depression.  Plan:  Recommend psychiatric Inpatient admission when medically cleared. Disposition: To find  inpatient psychiatric placement when patient is medically cleared.  Corena Pilgrim, MD 07/11/2014 2:11 PM

## 2014-07-11 NOTE — Progress Notes (Addendum)
Chaplain attempted visit x2 with pt after medical team referral. Pt sleeping. Chaplain will attempt visit at later time. Page as needed [phone]. StameyMayer Masker, Chaplain 07/11/2014  4:27 PM

## 2014-07-12 LAB — CULTURE, RESPIRATORY: CULTURE: NORMAL

## 2014-07-12 LAB — GLUCOSE, CAPILLARY
GLUCOSE-CAPILLARY: 127 mg/dL — AB (ref 70–99)
GLUCOSE-CAPILLARY: 235 mg/dL — AB (ref 70–99)
GLUCOSE-CAPILLARY: 269 mg/dL — AB (ref 70–99)
Glucose-Capillary: 215 mg/dL — ABNORMAL HIGH (ref 70–99)

## 2014-07-12 LAB — CULTURE, RESPIRATORY W GRAM STAIN

## 2014-07-12 MED ORDER — INSULIN ASPART 100 UNIT/ML ~~LOC~~ SOLN
0.0000 [IU] | Freq: Three times a day (TID) | SUBCUTANEOUS | Status: DC
  Administered 2014-07-12 (×2): 5 [IU] via SUBCUTANEOUS
  Administered 2014-07-13: 8 [IU] via SUBCUTANEOUS
  Administered 2014-07-13 (×2): 3 [IU] via SUBCUTANEOUS
  Administered 2014-07-14 (×2): 5 [IU] via SUBCUTANEOUS
  Administered 2014-07-15: 8 [IU] via SUBCUTANEOUS

## 2014-07-12 MED ORDER — INSULIN ASPART 100 UNIT/ML ~~LOC~~ SOLN: 0.0000 [IU] | Freq: Three times a day (TID) | SUBCUTANEOUS | Status: DC

## 2014-07-12 NOTE — Progress Notes (Signed)
    Subjective:   Day of hospitalization: 4  VSS. Pt reports cough comes and goes.  He is with the sitter in his room.  He is agreeable to go to inpatient psych tomorrow.    Objective:   Physical Exam: Constitutional: Vital signs reviewed. Patient is lying in bed in no acute distress and cooperative with exam.  HEENT: [place]/AT; EOMI, conjunctivae normal, no scleral icterus  Cardiovascular: RRR, no MRG Pulmonary/Chest: normal respiratory effort, no accessory muscle use, CTAB, no wheezes, rales, or rhonchi Abdominal: Soft. +BS, NT/ND Neurological: A&O x3, CN II-XII grossly intact; non-focal exam Extremities: 2+DP b/l, no C/C/E  Skin: Warm, dry and intact.    Assessment/Plan:   Principal Problem:  CAP (community acquired pneumonia) Active Problems:  DM2 (diabetes mellitus, type 2)  Chronic systolic heart failure  Community acquired pneumonia  Depression, major, single episode, moderate  CAP- Improving. -continue augmentin and azithromycin   SI- Psychiatry recommending inpatient stay.  Began lexapro.   -plan for inpatient psych tomorrow  -consult spiritual care (came by to see yesterday but was asleep) -cont lexapro  Non-sustained Vtach- Resolved; pt has a h/o HF and VT is not surprising in the setting of NICM. -will consult HF team and make them aware -->would he be a candidate for an ICD?  HIV- -continue current meds; has assistance with meds from and outside source-->SW consulted  Homelessness- -consulted SW  Disposition Disposition is deferred, awaiting improvement of current medical problems. Anticipated discharge in approximately 1-2 day(s).    Marrian Salvage, MD PGY-2, Internal Medicine Teaching Service 11:13 AM

## 2014-07-13 DIAGNOSIS — R45851 Suicidal ideations: Secondary | ICD-10-CM

## 2014-07-13 LAB — CULTURE, BLOOD (ROUTINE X 2)
CULTURE: NO GROWTH
Culture: NO GROWTH

## 2014-07-13 LAB — BASIC METABOLIC PANEL
Anion gap: 14 (ref 5–15)
BUN: 20 mg/dL (ref 6–23)
CHLORIDE: 99 mmol/L (ref 96–112)
CO2: 18 mmol/L — AB (ref 19–32)
Calcium: 9.3 mg/dL (ref 8.4–10.5)
Creatinine, Ser: 1.03 mg/dL (ref 0.50–1.35)
GFR calc Af Amer: >90 mL/min (ref >90)
GFR calc non Af Amer: >90 mL/min (ref >90)
Glucose, Bld: 293 mg/dL — ABNORMAL HIGH (ref 70–99)
Potassium: 5.2 mmol/L — ABNORMAL HIGH (ref 3.5–5.1)
Sodium: 131 mmol/L — ABNORMAL LOW (ref 135–145)

## 2014-07-13 LAB — MAGNESIUM: Magnesium: 1.9 mg/dL (ref 1.5–2.5)

## 2014-07-13 LAB — GLUCOSE, CAPILLARY
Glucose-Capillary: 168 mg/dL — ABNORMAL HIGH (ref 70–99)
Glucose-Capillary: 183 mg/dL — ABNORMAL HIGH (ref 70–99)
Glucose-Capillary: 257 mg/dL — ABNORMAL HIGH (ref 70–99)
Glucose-Capillary: 224 mg/dL — ABNORMAL HIGH (ref 70–99)

## 2014-07-13 MED ORDER — FUROSEMIDE 40 MG PO TABS
40.0000 mg | ORAL_TABLET | ORAL | Status: DC
  Administered 2014-07-13: 40 mg via ORAL
  Filled 2014-07-13 (×2): qty 1

## 2014-07-13 MED ORDER — MAGNESIUM SULFATE 2 GM/50ML IV SOLN
2.0000 g | Freq: Once | INTRAVENOUS | Status: DC
  Filled 2014-07-13: qty 50

## 2014-07-13 MED ORDER — SODIUM CHLORIDE 0.9 % IV SOLN
1.0000 g | Freq: Once | INTRAVENOUS | Status: DC
  Filled 2014-07-13: qty 10

## 2014-07-13 MED ORDER — LOSARTAN POTASSIUM 50 MG PO TABS
50.0000 mg | ORAL_TABLET | Freq: Two times a day (BID) | ORAL | Status: DC
  Administered 2014-07-13 – 2014-07-15 (×4): 50 mg via ORAL
  Filled 2014-07-13 (×5): qty 1

## 2014-07-13 MED ORDER — CARVEDILOL 6.25 MG PO TABS
18.7500 mg | ORAL_TABLET | Freq: Two times a day (BID) | ORAL | Status: DC
  Administered 2014-07-13 – 2014-07-15 (×4): 18.75 mg via ORAL
  Filled 2014-07-13 (×6): qty 1

## 2014-07-13 MED ORDER — INSULIN ASPART 100 UNIT/ML IV SOLN: 10.0000 [IU] | Freq: Once | INTRAVENOUS | Status: DC

## 2014-07-13 NOTE — Progress Notes (Signed)
Subjective: NAEON. Afebrile overnight with sats 94-100% on RA. BP with better control.   Reports no fevers or chills. Continues to have cough, but it has improved some. He endorses continued SOB with coughing spells, but none with getting up to the bathroom. Normal appetite. States that he agrees that inpatient psychiatric stay would likely benefit him.   Objective: Vital signs in last 24 hours: Filed Vitals:   07/12/14 1756 07/12/14 2155 07/13/14 0520 07/13/14 0542  BP: 129/81 133/69 143/85   Pulse:  90 98   Temp:  98.2 F (36.8 C) 98 F (36.7 C)   TempSrc:  Oral Oral   Resp:  18 20   Height:      Weight:    81 kg (178 lb 9.2 oz)  SpO2:  100% 100%    Weight change: -1.5 kg (-3 lb 4.9 oz)  Intake/Output Summary (Last 24 hours) at 07/13/14 0733 Last data filed at 07/13/14 0544  Gross per 24 hour  Intake   1156 ml  Output   1851 ml  Net   -695 ml   General: Sitting up in bed in no acute distress.  Pulm: Normal work of breathing. CTAB bilaterally.  CV: Normal rate and rhythm. No murmurs auscultated.  Abdominal: Normal active bowel sounds. Soft, non-tender.  Extremities: No lower extremity edema.   Micro Results: Recent Results (from the past 240 hour(s))  Culture, blood (routine x 2)     Status: None (Preliminary result)   Collection Time: 07/07/14  2:24 PM  Result Value Ref Range Status   Specimen Description BLOOD LEFT ARM  Final   Special Requests BOTTLES DRAWN AEROBIC AND ANAEROBIC  Final   Culture   Final           BLOOD CULTURE RECEIVED NO GROWTH TO DATE CULTURE WILL BE HELD FOR 5 DAYS BEFORE ISSUING A FINAL NEGATIVE REPORT Performed at [facility]    Report Status PENDING  Incomplete  Culture, blood (routine x 2)     Status: None (Preliminary result)   Collection Time: 07/07/14  2:30 PM  Result Value Ref Range Status   Specimen Description BLOOD BLOOD LEFT FOREARM  Final   Special Requests BOTTLES DRAWN AEROBIC AND ANAEROBIC  Final   Culture   Final           BLOOD CULTURE RECEIVED NO GROWTH TO DATE CULTURE WILL BE HELD FOR 5 DAYS BEFORE ISSUING A FINAL NEGATIVE REPORT Performed at [facility]    Report Status PENDING  Incomplete  MRSA PCR Screening     Status: None   Collection Time: 07/07/14  6:52 PM  Result Value Ref Range Status   MRSA by PCR NEGATIVE NEGATIVE Final    Comment:        The GeneXpert MRSA Assay (FDA approved for NASAL specimens only), is one component of a comprehensive MRSA colonization surveillance program. It is not intended to diagnose MRSA infection nor to guide or monitor treatment for MRSA infections.   Respiratory virus antigens panel     Status: None   Collection Time: 07/08/14  8:37 PM  Result Value Ref Range Status   Respiratory Syncytial Virus A Negative Negative Final   Respiratory Syncytial Virus B Negative Negative Final   Influenza A Negative Negative Final   Influenza B Negative Negative Final   Parainfluenza 1 Negative Negative Final   Parainfluenza 2 Negative Negative Final   Parainfluenza 3 Negative Negative Final   Metapneumovirus Negative Negative  Final   Rhinovirus Negative Negative Final   Adenovirus Negative Negative Final    Comment: (NOTE) Performed At: [facility] [address] [place], [place] [id number] Mila Homer MD HY:[id number]   Culture, sputum-assessment     Status: None   Collection Time: 07/09/14  9:11 AM  Result Value Ref Range Status   Specimen Description SPUTUM  Final   Special Requests Immunocompromised  Final   Sputum evaluation   Final    THIS SPECIMEN IS ACCEPTABLE. RESPIRATORY CULTURE REPORT TO FOLLOW.   Report Status 07/09/2014 FINAL  Final  Pneumocystis smear by DFA     Status: None   Collection Time: 07/09/14  9:11 AM  Result Value Ref Range Status   Specimen Source-PJSRC SPUTUM  Final   Pneumocystis jiroveci Ag NEGATIVE  Final    Comment: Performed at [facility]  Culture, respiratory  (NON-Expectorated)     Status: None   Collection Time: 07/09/14  9:11 AM  Result Value Ref Range Status   Specimen Description SPUTUM  Final   Special Requests NONE  Final   Gram Stain   Final    ABUNDANT WBC PRESENT, PREDOMINANTLY PMN NO SQUAMOUS EPITHELIAL CELLS SEEN FEW GRAM POSITIVE COCCI IN PAIRS IN CHAINS IN CLUSTERS RARE GRAM POSITIVE RODS RARE GRAM NEGATIVE RODS    Culture   Final    NORMAL OROPHARYNGEAL FLORA Performed at [facility]    Report Status 07/12/2014 FINAL  Final   Medications:  Scheduled Meds: . amoxicillin-clavulanate  1 tablet Oral Q12H  . azithromycin  500 mg Oral Q24H  . carvedilol  12.5 mg Oral BID WC  . dolutegravir  50 mg Oral Daily  . emtricitabine-tenofovir  1 tablet Oral Daily  . enoxaparin (LOVENOX) injection  40 mg Subcutaneous Q24H  . escitalopram  10 mg Oral QHS  . hydrALAZINE  50 mg Oral 3 times per day  . insulin aspart  0-15 Units Subcutaneous TID WC  . insulin detemir  18 Units Subcutaneous QHS  . isosorbide mononitrate  30 mg Oral Daily  . losartan  50 mg Oral BID   Continuous Infusions:  PRN Meds:.acetaminophen, chlorpheniramine-HYDROcodone, guaiFENesin, ibuprofen, ipratropium-albuterol   Assessment/Plan:Mr. Acton is a 39 yo male with a PMH significant for HIV, DM, HTN, and HFrEF who is admitted for further evaluation of SOB, hemoptysis, cough, hypoxia, and CXR with LLL consolidation c/w CAP.   Principal Problem:   CAP (community acquired pneumonia) Active Problems:   DM2 (diabetes mellitus, type 2)   Chronic systolic heart failure   Community acquired pneumonia   Depression, major, single episode, moderate  ** CAP: Initially presented with SOB, cough, hemoptysis, and pleuritic chest pain. Precipitating factors likely include tobacco use and immunosuppression 2/2 HIV with current CD4 count of 470. Now s/p 3 days of IV ceftriaxone and oral azithromycin with significant clinical improvement in fevers and initial hypoxia.  Previously treated with IV ceftriaxone and azithromycin.  -- Now on oral azithromycin and augmentin (day 7/7) -- Blood cultures with no growth x5 days -- Streptococcus pneumoniae urinary antigen negative -- Sputum DFA negative, influenza panel negative, legionella urinary antigen negative, RVP negative -- Respiratory culture with abundant WBCs with PMN predominance, few gram positive cocci in pairs, chains, and clusters, and rare gram positive rods; sputum cx grew mixed oropharyngeal flora  -- Continue symptomatic care with guaifenesin and tussionex (chlorpheniramine-hydrocodone), tylenol and ibuprofen, and duonebs  ** Asymptomatic ventricular tachycardia: Overnight on 4/1 with asymptomatic non-sustained ventricular tachycardia; however, heart  rate at the time was 82; irregular rhythm with v-tach did have rate up to 100. Overnight on 4/2 had additional episodes of PVCs, as well as reported non-sustained V-tach lasting and v-tach on the tele. Unclear of value of possible ICD in the setting of probable NICM.  -- Will need follow-up with Dr. Gala Romney  ** HIV Infection: Followed with Dr. Ninetta Lights with ID and on truvada and tivicay with uncertain medication compliance. Last CD4 of 270 and viral load 10,387 in August of 2015. CD4 count this admission is 470 and viral load 60, suggesting improvement with initiation of HAART since last admission.  -- Continue home antiretrovirals -- F/u scheduled with Dr. Ninetta Lights on 4/12  ** Passive suicidality: Endorses some thoughts of self-harm, but does not appear to have an active plan. Psychiatry involvement and anti-depressants would likely be helpful. Will need to consider drug drug interactions between SSRIs and HIV medications.  -- Psychiatry consult; appreciate reccs -- Will need transfer to inpatient psych -- Now on lexapro 10 mg daily -- Chaplain consult  ** HFrEF, HTN: History of malignant HTN and non-ischemic cardiomyopathy felt to be most likely due  to HTN and HIV. Clinically euvolemic on exam, though BNP was elevated on admission. Improved control with home hypertensive medications, and increase in losartan to BID dosing.  -- Continue home heart failure and HTN meds including carvedilol, furosemide, hydralazine, imdur, and losartan.  ** Diabetes, type 2: Last A1c was August '15 at 9.7. With metformin at home, A1c during this admission is now 8.6.  -- Insulin determir 18 units QHS -- SSI  ** FEN/GI:  -- Modified carb diet   ** PPx:  -- Subcu lovenox   ** Dispo:  -- Full code -- Awaiting transfer to inpatient psychiatry -- F/u with PCP Dr. Orpah Cobb on 4/8 -- F/u with Dr. Ninetta Lights w/ ID on 4/12 -- Needs cardiology f/u  This is a Medical Student Note.  The care of the patient was discussed with Dr. Delane Ginger and the assessment and plan formulated with their assistance.  Please see their attached note for official documentation of the daily encounter.   LOS: 6 days   Clide Cliff, Med Student 07/13/2014, 7:33 AM [phone]

## 2014-07-13 NOTE — Clinical Social Work Psych Note (Signed)
Psych CSW sent referrals to the following facilities: [facility]- referral sent- at capacity [facility]- referral sent [facility]- referral sent- at capacity [facility]- referral sent- no sponsorship beds available [facility]- denied medical acuity [facility]- denied "patient does not meet criteria" [facility]- referral sent- at capacity [facility]- referral sent- at capacity [facility]- referral sent- denied due to medical acuity  Psych CSW will continue to assist with disposition/placement.  Vickii Penna, LCSWA [phone]  Psychiatric & Orthopedics (5N 1-8) Clinical Social Worker

## 2014-07-13 NOTE — Progress Notes (Signed)
    Subjective:   VSS. Pt reports cough comes and goes.  He is with the sitter in his room.  He is agreeable to go to inpatient psych.     Objective:   Physical Exam: Constitutional: Vital signs reviewed. Patient is lying in bed in no acute distress and cooperative with exam.  HEENT: [place]/AT; EOMI, conjunctivae normal, no scleral icterus  Cardiovascular: RRR, no MRG Pulmonary/Chest: normal respiratory effort, no accessory muscle use, CTAB, no wheezes, rales, or rhonchi Abdominal: Soft. +BS, NT/ND Neurological: A&O x3, CN II-XII grossly intact; non-focal exam Extremities: 2+DP b/l, no C/C/E  Skin: Warm, dry and intact.    Assessment/Plan:   Principal Problem:  CAP (community acquired pneumonia) Active Problems:  DM2 (diabetes mellitus, type 2)  Chronic systolic heart failure  Community acquired pneumonia  Depression, major, single episode, moderate  CAP- Improving, still residual cough.  -finish course of augmentin and azithromycin today for a total of 7 days   SI- Psychiatry recommending inpatient stay.  Began lexapro.   -plan for inpatient psych when a bed is available   -consult spiritual care  -cont lexapro  Non-sustained Vtach- Resolved; pt has a h/o HF and VT is not surprising in the setting of NICM. -consulted HF team and make them aware -->would he be a candidate for an ICD?  HIV- -continue current meds; has assistance with meds from and outside source-->SW consulted  Homelessness- -consulted SW and was given options for shelters and additional support   Disposition Disposition is deferred, awaiting inpatient psych bed.    Marrian Salvage, MD PGY-2, Internal Medicine Teaching Service 11:44 AM

## 2014-07-13 NOTE — Consult Note (Signed)
Advanced Heart Failure Team Consult Note  Referring Physician: Dr  Dareen Piano Primary Physician: Primary Cardiologist:  Dr Haroldine Laws   Reason for Consultation: NSVT and HF  HPI:   Shaun Brennan is a 39 yo male with a history of HIV, DM2, HTN, tobacco abuse and chronic systolic heart failure with most recent ECHO 04/27/2014 EF 25-30% RV mildy HK. Negative Hep B surface antigen and negative HCV AB; RPR non-reactive admitted with increased cough. The HF team asked to evaluate for NSVT noted today and HF  Prior to admit he has been followed in the HF clinic and was last seen in January.  Had an ECHO during that showed ongoing low EF but due to socioeconomic situation he was not a candidate for an ICD. At that time he had missed his meds a couple times per week.   In February he was in jail for over a month off all HF meds. When he was released he not longer had his HF meds and he returned to the street.     Admitted with cough . CXR showed LLL infiltrate--> CAP. Flu swab negative. Placed on ceftiraxone and azithromycin. antibiotics. He continued on HF meds. On 4/2 physiciatry consulted due to suicidal ideation with plan for physciatric placement when stable for discharge. Sitter in place.   Says he is feeling much better. Denies SOB.    Review of Systems: [y] = yes, [ ]  = no   General: Weight gain [ ] ; Weight loss [ ] ; Anorexia [ ] ; Fatigue [ Y]; Fever [ ] ; Chills [ ] ; Weakness [Y ]  Cardiac: Chest pain/pressure [ ] ; Resting SOB [ ] ; Exertional SOB [ ] ; Orthopnea [ ] ; Pedal Edema [ ] ; Palpitations [ ] ; Syncope [ ] ; Presyncope [ ] ; Paroxysmal nocturnal dyspnea[ ]   Pulmonary: Cough Jazmín.Cullens ]; Wheezing[ ] ; Hemoptysis[ ] ; Sputum [ ] ; Snoring [ ]   GI: Vomiting[ ] ; Dysphagia[ ] ; Melena[ ] ; Hematochezia [ ] ; Heartburn[ ] ; Abdominal pain [ ] ; Constipation [ ] ; Diarrhea [ ] ; BRBPR [ ]   GU: Hematuria[ ] ; Dysuria [ ] ; Nocturia[ ]   Vascular: Pain in legs with walking [ ] ; Pain in feet with lying flat [ ] ;  Non-healing sores [ ] ; Stroke [ ] ; TIA [ ] ; Slurred speech [ ] ;  Neuro: Headaches[ ] ; Vertigo[ ] ; Seizures[ ] ; Paresthesias[ ] ;Blurred vision [ ] ; Diplopia [ ] ; Vision changes [ ]   Ortho/Skin: Arthritis [ ] ; Joint pain [Y ]; Muscle pain [ ] ; Joint swelling [ ] ; Back Pain [ ] ; Rash [ ]   Psych: Depression[Y]; Anxiety[ ]   Heme: Bleeding problems [ ] ; Clotting disorders [ ] ; Anemia [ ]   Endocrine: Diabetes [ ] ; Thyroid dysfunction[ ]   Home Medications Prior to Admission medications   Medication Sig Start Date End Date Taking? Authorizing Provider  carvedilol (COREG) 12.5 MG tablet Take 1 tablet (12.5 mg total) by mouth 2 (two) times daily with a meal. 03/25/14  Yes Rande Brunt, NP  dolutegravir (TIVICAY) 50 MG tablet Take 1 tablet (50 mg total) by mouth daily. 02/04/14  Yes Thayer Headings, MD  emtricitabine-tenofovir (TRUVADA) 200-300 MG per tablet Take 1 tablet by mouth daily. 02/04/14  Yes Thayer Headings, MD  furosemide (LASIX) 40 MG tablet Take only on Mondays and Fridays 03/25/14  Yes Rande Brunt, NP  glucose monitoring kit (FREESTYLE) monitoring kit 1 each by Does not apply route 4 (four) times daily - after meals and at bedtime. 1 month Diabetic Testing Supplies for QAC-QHS accuchecks. 12/12/13  Yes  Lorayne Marek, MD  hydrALAZINE (APRESOLINE) 50 MG tablet Take 1 tablet (50 mg total) by mouth every 8 (eight) hours. 03/25/14  Yes Rande Brunt, NP  isosorbide mononitrate (IMDUR) 30 MG 24 hr tablet Take 1 tablet (30 mg total) by mouth daily. 03/25/14  Yes Rande Brunt, NP  losartan (COZAAR) 50 MG tablet Take 1 tablet (50 mg total) by mouth daily. 04/27/14  Yes Jolaine Artist, MD  metFORMIN (GLUCOPHAGE) 500 MG tablet Take 1 tablet (500 mg total) by mouth 2 (two) times daily with a meal. 03/25/14  Yes Lorayne Marek, MD    Past Medical History: Past Medical History  Diagnosis Date  . HIV (human immunodeficiency virus infection)   . Diabetes mellitus without complication   .  Hypertension   . Chronic systolic heart failure     a. EF 15-20%, grade II DD, LA mild/mod dilated  . DM2 (diabetes mellitus, type 2)     Past Surgical History: Past Surgical History  Procedure Laterality Date  . Ankle surgery Right     Family History: Family History  Problem Relation Age of Onset  . Other Father   . Diabetes Mother   . Stroke Paternal Grandfather     Social History: History   Social History  . Marital Status: Single    Spouse Name: N/A  . Number of Children: N/A  . Years of Education: N/A   Social History Main Topics  . Smoking status: Current Every Day Smoker -- 0.25 packs/day    Types: Cigarettes  . Smokeless tobacco: Never Used     Comment: about 10 cigarettes a week  . Alcohol Use: Yes     Comment: socially about 6 pack per week   . Drug Use: Yes    Special: Marijuana  . Sexual Activity: Yes     Comment: pt. given condoms   Other Topics Concern  . None   Social History Narrative   Currently homeless    Allergies:  Allergies  Allergen Reactions  . Lisinopril Cough    Objective:    Vital Signs:   Temp:  [98 F (36.7 C)-98.5 F (36.9 C)] 98 F (36.7 C) (04/04 0520) Pulse Rate:  [77-98] 77 (04/04 0842) Resp:  [18-20] 20 (04/04 0520) BP: (114-143)/(67-89) 143/85 mmHg (04/04 1100) SpO2:  [94 %-100 %] 100 % (04/04 0520) Weight:  [178 lb 9.2 oz (81 kg)] 178 lb 9.2 oz (81 kg) (04/04 0542) Last BM Date: 07/12/14  Weight change: Filed Weights   07/11/14 0527 07/12/14 0628 07/13/14 0542  Weight: 191 lb 12.8 oz (87 kg) 181 lb 14.1 oz (82.5 kg) 178 lb 9.2 oz (81 kg)    Intake/Output:   Intake/Output Summary (Last 24 hours) at 07/13/14 1203 Last data filed at 07/13/14 0900  Gross per 24 hour  Intake   1156 ml  Output   1501 ml  Net   -345 ml     Physical Exam: General:  Well appearing. No resp difficulty. Sitting in chair. Sitter at bedside.  HEENT: normal Neck: supple. JVP  5-6. Carotids 2+ bilat; no bruits. No  lymphadenopathy or thryomegaly appreciated. Cor: PMI nondisplaced. Regular rate & rhythm. No rubs, gallops or murmurs. Lungs: LLL decreased  Abdomen: soft, nontender, nondistended. No hepatosplenomegaly. No bruits or masses. Good bowel sounds. Extremities: no cyanosis, clubbing, rash, edema Neuro: alert & orientedx3, cranial nerves grossly intact. moves all 4 extremities w/o difficulty. Affect pleasant  Telemetry: SR   Labs: Basic Metabolic Panel:  Recent  Labs Lab 07/07/14 1224 07/07/14 1448 07/08/14 0320 07/09/14 0442 07/10/14 0600  NA 129* 130* 132* 132* 134*  K 4.6 5.0 4.0 4.2 4.2  CL 92* 93* 96 97 102  CO2 29  --  $R'28 27 25  'rC$ GLUCOSE 327* 299* 158* 230* 100*  BUN $Re'9 9 10 12 14  'ZUg$ CREATININE 1.08 0.80 1.01 1.16 0.90  CALCIUM 8.5  --  8.5 8.8 8.9    Liver Function Tests:  Recent Labs Lab 07/07/14 1224 07/08/14 0320  AST 46* 29  ALT 46 33  ALKPHOS 100 77  BILITOT 0.7 0.7  PROT 7.9 7.7  ALBUMIN 3.3* 2.8*   No results for input(s): LIPASE, AMYLASE in the last 168 hours. No results for input(s): AMMONIA in the last 168 hours.  CBC:  Recent Labs Lab 07/07/14 1224 07/07/14 1448 07/08/14 0320 07/08/14 1114 07/09/14 0442 07/10/14 0600  WBC 8.7  --  12.4* 12.1* 10.2 7.2  NEUTROABS  --   --   --  9.9*  --   --   HGB 15.1 15.6 13.4 13.8 13.4 13.2  HCT 43.4 46.0 39.3 40.0 39.4 39.2  MCV 96.7  --  96.3 96.9 97.3 97.5  PLT 124*  --  112* 116* 122* 133*    Cardiac Enzymes: No results for input(s): CKTOTAL, CKMB, CKMBINDEX, TROPONINI in the last 168 hours.  BNP: BNP (last 3 results)  Recent Labs  07/07/14 1224  BNP 224.4*    ProBNP (last 3 results)  Recent Labs  12/06/13 0920 12/20/13 1210 03/09/14 1056  PROBNP 1289.0* 70.9 130.6*     CBG:  Recent Labs Lab 07/12/14 0746 07/12/14 1214 07/12/14 1720 07/12/14 2208 07/13/14 0800  GLUCAP 127* 215* 235* 269* 168*    Coagulation Studies: No results for input(s): LABPROT, INR in the last 72  hours.  Other results: EKG: , 07/07/2014 Sinus Tach 118 bpm  Imaging:  No results found.   Medications:     Current Medications: . amoxicillin-clavulanate  1 tablet Oral Q12H  . azithromycin  500 mg Oral Q24H  . carvedilol  12.5 mg Oral BID WC  . dolutegravir  50 mg Oral Daily  . emtricitabine-tenofovir  1 tablet Oral Daily  . enoxaparin (LOVENOX) injection  40 mg Subcutaneous Q24H  . escitalopram  10 mg Oral QHS  . hydrALAZINE  50 mg Oral 3 times per day  . insulin aspart  0-15 Units Subcutaneous TID WC  . insulin detemir  18 Units Subcutaneous QHS  . isosorbide mononitrate  30 mg Oral Daily  . losartan  50 mg Oral BID     Infusions:      Assessment/Plan  1. Pneumonia- RLL inifiltrate per primary team.  2. Suicidal Ideation- Sitter in room Per Pysch  3. NSVT-  I reviewed all alarms and looks like a lot of artifact. Check BMET and Mag now 2. Chronic systolic HF: NICM?, EF 29-93% in January. Difficult situation to manage HF due to socioeconomic situation. HF team provided 30 day supply of meds but he went to jail and was off all HF meds for over a month. Doing ok on HF meds.  From HF perspective he is stable. Increase carvedilol to 18.75 mg twice a day.  Continue lasix 40 mg on Mondays and Fridays.  Continue hydralazine 50 mg tid and Imdur 30 mg daily.  - No Ace due to cough. Continue losartan 50 mg twice a day. No on spiro or dig due to noncompliance.   -2) HTN- OK. Continue  current regimen and increase carvedilol.  3) HIV- Per primary team Following with ID clinic 4) Social Issues- Currently homeless and living in an abandoned house.  5) Tobacco abuse:- Reports smoking about 4-5 cigarettes a week. Encouraged to try and abstain completely.  6) Alcohol abuse- Drinking 5-6 beers a week. Needs to stop.  7) Hyoalbumin- Albumin 2.8.   When [facility] is released from [facility] will need 30 days of all HF meds as he has no money to pick up. He will need follow up in  HF clinic after discharge. [phone]. Will see if HF navigator can assist with meds when he is discharged.   Length of Stay: 6  CLEGG,[name] NP-C  07/13/2014, 12:03 PM  Advanced Heart Failure Team Pager [phone] (M-F; [place])  Please contact [facility] for night-coverage after hours (4p -7a ) and weekends on [email or web address]  Patient seen with NP, agree with the above note.  I reviewed his telemetry, I saw a lot of artifact but no definite NSVT/PVCs.  We increased his Coreg.  He is ok to go to behavioral health on current cardiac meds.  Needs followup in CHF clinic.   Loralie Champagne 07/13/2014 1:46 PM

## 2014-07-13 NOTE — Progress Notes (Signed)
Chaplain responded to consult.  Pt requested prayer, following prayer pt began weeping.  Pt shared that he feels "down in a rut" and that "he does not want to hurt himself," but "just wants to get through this."  Chaplain provided emotional and spiritual support as well as empathetic listening.  Chaplain will follow up as needed.    07/13/14 1100  Clinical Encounter Type  Visited With Patient  Visit Type Initial;Psychological support;Spiritual support;Social support  Referral From Nurse  Spiritual Encounters  Spiritual Needs Prayer;Emotional;Grief support  Stress Factors  Patient Stress Factors Exhausted;Loss of control   Blain Pais 07/13/2014 11:34 AM

## 2014-07-13 NOTE — Clinical Social Work Psych Assess (Signed)
Clinical Social Work Department CLINICAL SOCIAL WORK PSYCHIATRY SERVICE LINE ASSESSMENT 07/13/2014  Patient:  Shaun Brennan  Account:  [id number]  Admit Date:  07/07/2014  Clinical Social Worker:  Wylene Men  Date/Time:  07/13/2014 10:51 AM Referred by:  Physician  Date referred:  07/13/2014 Reason for Referral  Crisis Intervention  Psychosocial assessment   Presenting Symptoms/Problems (In the person's/family's own words):   Pysychiatry was consulted for suicidal ideation and depression   Abuse/Neglect/Trauma History (check all that apply)  Denies history   Abuse/Neglect/Trauma Comments:   patient guarded during the course of this assessment   Psychiatric History (check all that apply)  Denies history   Psychiatric medications:  none known; reported or in chart; patient denies   Current Mental Health Hospitalizations/Previous Mental Health History:   patient denies MH treatment both inpatient and outpatient   Current provider:   no MH provider (current or hx)  -Patient is seen at [facility] and the CHF clinic.  Patient reports being compliant with both medications and apointments.  Patient reports no issues with obtaining medications or transportation.   Place and Date:   ongoing   Current Medications:   carvedilol (COREG) 12.5 MG tablet  Take 1 tablet (12.5 mg total) by mouth 2 (two) times daily with a meal.  60 tablet  3    .  dolutegravir (TIVICAY) 50 MG tablet  Take 1 tablet (50 mg total) by mouth daily.  30 tablet  5   .  emtricitabine-tenofovir (TRUVADA) 200-300 MG per tablet  Take 1 tablet by mouth daily.  30 tablet  5   .  furosemide (LASIX) 40 MG tablet  Take only on Mondays and Fridays  12 tablet  2   .  glucose monitoring kit (FREESTYLE) monitoring kit  1 each by Does not apply route 4 (four) times daily - after meals and at bedtime. 1 month Diabetic Testing Supplies for QAC-QHS accuchecks.  1 each  1   .  hydrALAZINE (APRESOLINE) 50 MG  tablet  Take 1 tablet (50 mg total) by mouth every 8 (eight) hours.  90 tablet  3   .  isosorbide mononitrate (IMDUR) 30 MG 24 hr tablet  Take 1 tablet (30 mg total) by mouth daily.  30 tablet  3   .  losartan (COZAAR) 50 MG tablet  Take 1 tablet (50 mg total) by mouth daily.  30 tablet  3   .  metFORMIN (GLUCOPHAGE) 500 MG tablet  Take 1 tablet (500 mg total) by mouth 2 (two) times daily with a meal.  60 tablet  0    Previous Impatient Admission/Date/Reason:   no MH admissions- patient denies hx of MH treatment both IP and OP  - 12/20/2013- suicidal ideations with intent and plan dc'd to homeless shelter  -12/06/2013- Shortness of breath   Emotional Health / Current Symptoms    Suicide/Self Harm  Suicidal ideation (ex: "I can't take any more,I wish I could disappear")   Suicide attempt in the past:   no hx of suicide attempts though patient states he has passive thoughts of suicide that "won't leave"  patient states he wishes he had a "normal life" and wishes he could rid himself of his current comorbidities   Other harmful behavior:   patient states "I could hurt somebody right now" though there is no clear plan or specific target of harm   Psychotic/Dissociative Symptoms  None reported   Other Psychotic/Dissociative Symptoms:   none reported  or noted in the chart    Attention/Behavioral Symptoms  Impulsive  Withdrawn  Within Normal Limits   Other Attention / Behavioral Symptoms:   Patient presented initially as being guarded and withdrawn. Towards the middle/end of psych CSW assessment, patient was laughing, smiling and communicating fluidly    Cognitive Impairment  Orientation - Place  Orientation - Self  Orientation - Situation  Orientation - Time  Poor Judgement  Poor/Impaired Decision-Making   Other Cognitive Impairment:   none reported or noted in the chart    Mood and Adjustment  Aggressive/frustrated  DEPRESSION  Anxious  Guarded  Unstable/Inconsistent     Stress, Anxiety, Trauma, Any Recent Loss/Stressor  Anxiety  Avoidance  Grief/Loss (recent or history)  Relationship  Other - See comment   Anxiety (frequency):   Patient has numerous triggers: homelessness, relationship break-up between partner Broadus John (2015), comorbidities including HIV, CHF and chronic heart failure, no driver's license, no vehicle for transportation   Phobia (specify):   none reported or noted in the chart   Compulsive behavior (specify):   none reported or noted in the chart   Obsessive behavior (specify):   none reported or noted in the chart   Other:   No other stressors/triggers noted other than those listed above   Substance Abuse/Use  None   SBIRT completed (please refer for detailed history):  N  Self-reported substance use:   patient denies SA/ETOH use/abuse though chart (hx) states poly-substance and tobacco usage (2015)   Urinary Drug Screen Completed:  N Alcohol level:   No UDS or ETOH tested    Environmental/Housing/Living Arrangement  HOMELESS   Who is in the home:   patient reports being homeless- living on the streets x8 years   Emergency contact:  Catheryn Bacon, Bridge Counselor with [employer] [phone]    Patient's Strengths and Goals (patient's own words):   Patient has working knowledge of community resources including bus transportation, housing assistance.  Patient also has a Education officer, museum Armed forces logistics/support/administrative officer) who assists with community needs (information in emergency contact above)   Clinical Social Worker's Interpretive Summary:   Psych CSW was consulted due to suicidial ideations during this hospital admission.  Psych CSW met with patient at bedside and completed assessment.  Patient was found to be in paper scrubs, lying quietly in bed, watching TV with sitter at bedside.  During the inital piece of this assessment, patientw as guarded, withdrawn, angry and often frustrated with questions being  asked.  As the assessment progressed, patient became cooperative and often sat up in bed laughing, smiling and communicating fluidly.    Patient denies SI at this time.  Patient reports not "having the balls to hurt" himself.  Patient states that he has a lot "stacked against" him at this time.  Patient is homeless x 8 years.  Patient has had a break-up with partner, Broadus John (2015) and has not been able to be in a relationship since, patient has several comorbidities (including HIV< CHF and DM), patient does not have a driver's license and no vehicle for transportation. Patient also reports having no support system- no family, no friends...no one.    Patient denies AVHD at this time.  Patient denies use of illicit drugs and the use of ETOH.  Of note, per chart review, 2015 patient reports polysubstance abuse, ETOH use and tobacco use.  Patient currently denies.  No UDS/ETOH screen was completed.    Patient denies being suicidal at this time though he  reports continued passive suicidal thoughts that are chronic in nature.  Patient reports that he feels like he "could hurt somebody right now".  When prompted to answer why, the patient had no response.    Patient was frustrated with sitter being at bedside stating that he did not need a babysitter.  Psych CSW reviewed policy and procedures with the patient. Patient was able to be redirected and understood the policy.    Patient is requesting to be admitted to inpatient psych at this time.  Patient was seen by MD Akintayo on 4/2 who recommends inpatient psychiatric hospitalization at this time.  Psych CSW has started the process of bed finding, however, patient will need an updated psychiatric evaluation.  Medical staff is aware.  Psych CSW updated charge nurse on the unit.   Disposition:  Inpatient referral made [facility], [facility] [place])   Nonnie Done, [place] [phone]  [place] (5N 1-8) Clinical Social Worker

## 2014-07-13 NOTE — Clinical Social Work Psych Note (Signed)
Psych CSW received report that patient was seen by MD Akintayo (psychiatrist) on 07/11/2014.  Disposition recommendation is Inpatient Psychiatric Hospitalization.  Psych CSW spoke with MD (teaching service) and learned of patient's medical readiness.  Psych CSW will begin search for inpatient psychiatric bed availability.  Psychiatry will need to re-evaluate for continued need for placement (last note being in excess of 48 hours).   Vickii Penna, LCSWA [phone]  Psychiatric & Orthopedics (5N 1-8) Clinical Social Worker

## 2014-07-13 NOTE — Clinical Social Work Note (Signed)
CSW received message from MD requesting assistance with inpatient psych placement. CSW paged [phone] to discuss, but has not received call back. Psych CSW will be assessing for inpatient psych placement if still appropriate. This CSW signing off.  Roddie Mc MSW, [place], [place], [phone]

## 2014-07-14 DIAGNOSIS — F329 Major depressive disorder, single episode, unspecified: Secondary | ICD-10-CM

## 2014-07-14 DIAGNOSIS — F321 Major depressive disorder, single episode, moderate: Secondary | ICD-10-CM

## 2014-07-14 DIAGNOSIS — M549 Dorsalgia, unspecified: Secondary | ICD-10-CM

## 2014-07-14 DIAGNOSIS — I472 Ventricular tachycardia: Secondary | ICD-10-CM

## 2014-07-14 DIAGNOSIS — R45851 Suicidal ideations: Secondary | ICD-10-CM

## 2014-07-14 DIAGNOSIS — E1165 Type 2 diabetes mellitus with hyperglycemia: Secondary | ICD-10-CM

## 2014-07-14 LAB — BASIC METABOLIC PANEL
Anion gap: 4 — ABNORMAL LOW (ref 5–15)
BUN: 19 mg/dL (ref 6–23)
CO2: 31 mmol/L (ref 19–32)
CREATININE: 0.94 mg/dL (ref 0.50–1.35)
Calcium: 9 mg/dL (ref 8.4–10.5)
Chloride: 99 mmol/L (ref 96–112)
Glucose, Bld: 102 mg/dL — ABNORMAL HIGH (ref 70–99)
POTASSIUM: 4.1 mmol/L (ref 3.5–5.1)
Sodium: 134 mmol/L — ABNORMAL LOW (ref 135–145)

## 2014-07-14 LAB — MAGNESIUM: Magnesium: 1.8 mg/dL (ref 1.5–2.5)

## 2014-07-14 LAB — GLUCOSE, CAPILLARY
GLUCOSE-CAPILLARY: 243 mg/dL — AB (ref 70–99)
Glucose-Capillary: 114 mg/dL — ABNORMAL HIGH (ref 70–99)
Glucose-Capillary: 248 mg/dL — ABNORMAL HIGH (ref 70–99)

## 2014-07-14 NOTE — Progress Notes (Signed)
Inpatient Diabetes Program Recommendations  AACE/ADA: New Consensus Statement on Inpatient Glycemic Control (2013)  Target Ranges:  Prepandial:   less than 140 mg/dL      Peak postprandial:   less than 180 mg/dL (1-2 hours)      Critically ill patients:  140 - 180 mg/dL   Results for JAQUANTE, KIRSCHENMANN (MRN [id number]) as of 07/14/2014 15:54  Ref. Range 07/13/2014 12:06 07/13/2014 17:23 07/13/2014 22:43 07/14/2014 07:43 07/14/2014 12:14  Glucose-Capillary Latest Range: 70-99 mg/dL 151 (H) 761 (H) 607 (H) 114 (H) 248 (H)    Reason for assessment- elevated CBG   Please consider ordering Novolog 4 units TID with meal for meal coverage in addition to correction scale if patient is consuming at least 50% of meals.  Susette Racer, RN, BA, MHA, CDE Diabetes Coordinator Inpatient Diabetes Program  [phone] (Team Pager) [phone] [name] Office) 07/14/2014 4:00 PM

## 2014-07-14 NOTE — Clinical Social Work Psych Note (Addendum)
2:45pm- Psych CSW received notification that patient has been denied at [facility] and [facility]  Psychiatrist updated.  12:03pm- Patient is currently under review at [facility] and [facility].  Vickii Penna, LCSWA [phone]  Psychiatric & Orthopedics (5N 1-8) Clinical Social Worker

## 2014-07-14 NOTE — Consult Note (Signed)
Psychiatry Consult follow-up  Reason for Consult:  Depression, suicidal thoughts Referring Physician:  Dr. Heide Spark Patient Identification: Shaun Brennan MRN:  [id number] Principal Diagnosis: Major depressive disorder single episode moderate Diagnosis:   Patient Active Problem List   Diagnosis Date Noted  . Suicidal ideation [R45.851] 07/14/2014  . Major depression [F32.2] 07/14/2014  . Depression, major, single episode, moderate [F32.1] 07/11/2014  . Community acquired pneumonia [J18.9] 07/07/2014  . CAP (community acquired pneumonia) [J18.9] 07/07/2014  . Chronic systolic heart failure [I50.22] 12/17/2013  . Diabetes mellitus due to underlying condition without complications [E08.9] 12/12/2013  . Smoking [Z72.0] 12/12/2013  . Acute on chronic systolic heart failure [I50.23] 12/09/2013  . Acute respiratory distress [J80] 12/06/2013  . 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 DM2 (diabetes mellitus, type 2) [E11.9] 12/06/2013  . HIV (human immunodeficiency virus infection) [Z21] 12/06/2013  . H/O CHF [Z86.79] 12/06/2013  . Cardiomegaly: per cxr 12/06/13 [I51.7] 12/06/2013    Total Time spent with patient: 30 minutes  Subjective:   Shaun Brennan is a 39 y.o. male patient admitted with cough and shortness of breath.  HPI:  Patient with history of Hiv, Hypertension and diabetes, CHF who was admitted for shortness of breath and cough. Patient denies prior history of mental illness but reports suicidal thoughts during this admission.  Patient had talked to the unit social worker about recurrent thoughts of suicide. Patient reports that he has been feeling depressed for months but never told anyone. During the interview,  he became tearful when speaking about his life, stating that he just doesn't want to live like this anymore; "I want to live normal life like everyone else, get marries, have children and be able to do what normal people do.'' He is reporting recurrent suicidal thoughts  but has no plan, "says I have no balls to kill myself.'' Patient states that he is homeless, helpless, hopeless,has low energy level and lack of motivation. He states that he has been stressed out and frustrated due to financial issues. He states that he had applied for disability but does not feel it will be approved. Patient denies psychosis or delusional thinking and agreed to be started on an antidepressant. He denies drugs use but reports occasional use of alcohol and Marijuana.   Interval history: Patient was seen for psychiatric consultation follow-up today. Patient reported he has been repeatedly informed to psychiatrist and non-psychiatric providers that he is not suicidal and he was annoyed by having the suicidal watch. Patient requested no safety sitter for him. Patient endorses symptoms of depression secondary to multiple psychosocial stressors including health problems and lack of job and finances. Patient has been compliant with his medication and has no reported side effect and tolerating well. Patient also reported has a limited family and friends support. Patient reported [place] was [facility] [place] [facility] about a year ago for change of seen. Patient is willing to receive outpatient psychiatric services and also counseling services when medically stable. Patient does not meet criteria for acute psychiatric hospitalization as he does not have acute suicidal ideations, homicidal ideation, intention or plans. Please review the history of present illness for more details.   Past Medical History:  Past Medical History  Diagnosis Date  . HIV (human immunodeficiency virus infection)   . Diabetes mellitus without complication   . Hypertension   . Chronic systolic heart failure     a. EF 15-20%, grade II DD, LA mild/mod dilated  . DM2 (diabetes mellitus, type 2)  Past Surgical History  Procedure Laterality Date  . Ankle surgery Right    Family History:  Family History   Problem Relation Age of Onset  . Other Father   . Diabetes Mother   . Stroke Paternal Grandfather    Social History:  History  Alcohol Use  . Yes    Comment: socially about 6 pack per week      History  Drug Use  . Yes  . Special: Marijuana    History   Social History  . Marital Status: Single    Spouse Name: N/A  . Number of Children: N/A  . Years of Education: N/A   Social History Main Topics  . Smoking status: Current Every Day Smoker -- 0.25 packs/day    Types: Cigarettes  . Smokeless tobacco: Never Used     Comment: about 10 cigarettes a week  . Alcohol Use: Yes     Comment: socially about 6 pack per week   . Drug Use: Yes    Special: Marijuana  . Sexual Activity: Yes     Comment: pt. given condoms   Other Topics Concern  . None   Social History Narrative   Currently homeless   Additional Social History:                          Allergies:   Allergies  Allergen Reactions  . Lisinopril Cough    Labs:  Results for orders placed or performed during the hospital encounter of 07/07/14 (from the past 48 hour(s))  Glucose, capillary     Status: Abnormal   Collection Time: 07/12/14 12:14 PM  Result Value Ref Range   Glucose-Capillary 215 (H) 70 - 99 mg/dL  Glucose, capillary     Status: Abnormal   Collection Time: 07/12/14  5:20 PM  Result Value Ref Range   Glucose-Capillary 235 (H) 70 - 99 mg/dL  Glucose, capillary     Status: Abnormal   Collection Time: 07/12/14 10:08 PM  Result Value Ref Range   Glucose-Capillary 269 (H) 70 - 99 mg/dL   Comment 1 Notify RN   Glucose, capillary     Status: Abnormal   Collection Time: 07/13/14  8:00 AM  Result Value Ref Range   Glucose-Capillary 168 (H) 70 - 99 mg/dL  Glucose, capillary     Status: Abnormal   Collection Time: 07/13/14 12:06 PM  Result Value Ref Range   Glucose-Capillary 183 (H) 70 - 99 mg/dL  Magnesium     Status: None   Collection Time: 07/13/14  2:27 PM  Result Value Ref Range    Magnesium 1.9 1.5 - 2.5 mg/dL  Basic metabolic panel Once     Status: Abnormal   Collection Time: 07/13/14  2:27 PM  Result Value Ref Range   Sodium 131 (L) 135 - 145 mmol/L   Potassium 5.2 (H) 3.5 - 5.1 mmol/L   Chloride 99 96 - 112 mmol/L   CO2 18 (L) 19 - 32 mmol/L   Glucose, Bld 293 (H) 70 - 99 mg/dL   BUN 20 6 - 23 mg/dL   Creatinine, Ser 1.03 0.50 - 1.35 mg/dL   Calcium 9.3 8.4 - 10.5 mg/dL   GFR calc non Af Amer >90 >90 mL/min   GFR calc Af Amer >90 >90 mL/min    Comment: (NOTE) The eGFR has been calculated using the CKD EPI equation. This calculation has not been validated in all clinical situations. eGFR's persistently <90  mL/min signify possible Chronic Kidney Disease.    Anion gap 14 5 - 15  Glucose, capillary     Status: Abnormal   Collection Time: 07/13/14  5:23 PM  Result Value Ref Range   Glucose-Capillary 257 (H) 70 - 99 mg/dL  Glucose, capillary     Status: Abnormal   Collection Time: 07/13/14 10:43 PM  Result Value Ref Range   Glucose-Capillary 224 (H) 70 - 99 mg/dL  Glucose, capillary     Status: Abnormal   Collection Time: 07/14/14  7:43 AM  Result Value Ref Range   Glucose-Capillary 114 (H) 70 - 99 mg/dL  Basic metabolic panel Once     Status: Abnormal   Collection Time: 07/14/14  7:55 AM  Result Value Ref Range   Sodium 134 (L) 135 - 145 mmol/L   Potassium 4.1 3.5 - 5.1 mmol/L   Chloride 99 96 - 112 mmol/L   CO2 31 19 - 32 mmol/L   Glucose, Bld 102 (H) 70 - 99 mg/dL   BUN 19 6 - 23 mg/dL   Creatinine, Ser 0.94 0.50 - 1.35 mg/dL   Calcium 9.0 8.4 - 10.5 mg/dL   GFR calc non Af Amer >90 >90 mL/min   GFR calc Af Amer >90 >90 mL/min    Comment: (NOTE) The eGFR has been calculated using the CKD EPI equation. This calculation has not been validated in all clinical situations. eGFR's persistently <90 mL/min signify possible Chronic Kidney Disease.    Anion gap 4 (L) 5 - 15  Magnesium     Status: None   Collection Time: 07/14/14  7:55 AM  Result  Value Ref Range   Magnesium 1.8 1.5 - 2.5 mg/dL    Vitals: Blood pressure 128/95, pulse 84, temperature 98.4 F (36.9 C), temperature source Oral, resp. rate 18, height $RemoveBe'5\' 5"'BENshpTpt$  (1.651 m), weight 84.324 kg (185 lb 14.4 oz), SpO2 100 %.  Risk to Self: Is patient at risk for suicide?: No Risk to Others:   Prior Inpatient Therapy:   Prior Outpatient Therapy:    Current Facility-Administered Medications  Medication Dose Route Frequency Provider Last Rate Last Dose  . acetaminophen (TYLENOL) tablet 650 mg  650 mg Oral Q6H PRN Alexa Sherral Hammers, MD   650 mg at 07/09/14 2315  . carvedilol (COREG) tablet 18.75 mg  18.75 mg Oral BID WC [name] D Clegg, NP   18.75 mg at 07/14/14 0901  . chlorpheniramine-HYDROcodone (TUSSIONEX) 10-8 MG/5ML suspension 5 mL  5 mL Oral Q12H PRN Alexa Sherral Hammers, MD   5 mL at 07/09/14 1717  . dolutegravir (TIVICAY) tablet 50 mg  50 mg Oral Daily Jessee Avers, MD   50 mg at 07/13/14 1157  . emtricitabine-tenofovir (TRUVADA) 200-300 MG per tablet 1 tablet  1 tablet Oral Daily Jessee Avers, MD   1 tablet at 07/13/14 1156  . enoxaparin (LOVENOX) injection 40 mg  40 mg Subcutaneous Q24H Jessee Avers, MD   40 mg at 07/13/14 1818  . escitalopram (LEXAPRO) tablet 10 mg  10 mg Oral QHS Mojeed Akintayo   10 mg at 07/13/14 2245  . furosemide (LASIX) tablet 40 mg  40 mg Oral Once per day on Mon Thu [name] D Clegg, NP   40 mg at 07/13/14 1401  . guaiFENesin tablet 200 mg  200 mg Oral Q4H PRN Alexa Sherral Hammers, MD   200 mg at 07/09/14 2319  . hydrALAZINE (APRESOLINE) tablet 50 mg  50 mg Oral 3 times per day Jessee Avers, MD  50 mg at 07/14/14 0630  . ibuprofen (ADVIL,MOTRIN) tablet 600 mg  600 mg Oral Q6H PRN Alexa Sherral Hammers, MD      . insulin aspart (novoLOG) injection 0-15 Units  0-15 Units Subcutaneous TID WC Jones Bales, MD   8 Units at 07/13/14 1817  . insulin detemir (LEVEMIR) injection 18 Units  18 Units Subcutaneous QHS Alexa Sherral Hammers, MD   18 Units at  07/13/14 2245  . ipratropium-albuterol (DUONEB) 0.5-2.5 (3) MG/3ML nebulizer solution 3 mL  3 mL Nebulization Q6H PRN Nischal Narendra, MD      . isosorbide mononitrate (IMDUR) 24 hr tablet 30 mg  30 mg Oral Daily Jessee Avers, MD   30 mg at 07/13/14 1156  . losartan (COZAAR) tablet 50 mg  50 mg Oral BID Wilber Oliphant, MD   50 mg at 07/13/14 2246    Musculoskeletal: Strength & Muscle Tone: within normal limits Gait & Station: normal Patient leans: N/A  Psychiatric Specialty Exam: Physical Exam  Psychiatric: His speech is normal. His mood appears anxious. He is slowed and withdrawn. Cognition and memory are normal. He expresses impulsivity. He exhibits a depressed mood. He expresses suicidal ideation.    Review of Systems  Constitutional: Positive for malaise/fatigue.  HENT: Negative.   Eyes: Negative.   Respiratory: Positive for cough and shortness of breath.   Cardiovascular: Negative.   Gastrointestinal: Negative.   Genitourinary: Negative.   Musculoskeletal: Negative.   Skin: Negative.   Neurological: Negative.   Endo/Heme/Allergies: Negative.   Psychiatric/Behavioral: Positive for depression, suicidal ideas and substance abuse. The patient is nervous/anxious.     Blood pressure 128/95, pulse 84, temperature 98.4 F (36.9 C), temperature source Oral, resp. rate 18, height 5\' 5"  (1.651 m), weight 84.324 kg (185 lb 14.4 oz), SpO2 100 %.Body mass index is 30.94 kg/(m^2).  General Appearance: Casual and dressed in hospital gown  Eye Contact::  Good  Speech:  Clear and Coherent  Volume:  Decreased  Mood:  Depressed and Hopeless  Affect:  Tearful  Thought Process:  Goal Directed  Orientation:  Full (Time, Place, and Person)  Thought Content:  Negative  Suicidal Thoughts:  No  Homicidal Thoughts:  No  Memory:  Immediate;   Fair Recent;   Fair Remote;   Good  Judgement:  Poor  Insight:  Shallow  Psychomotor Activity:  Psychomotor Retardation  Concentration:  Fair   Recall:  Good  Fund of Knowledge:Good  Language: Good  Akathisia:  No  Handed:  Right  AIMS (if indicated):     Assets:  Communication Skills Desire for Improvement Physical Health  ADL's:  Intact  Cognition: WNL  Sleep:   poor   Medical Decision Making: Review or order clinical lab tests (1), Established Problem, Worsening (2) and Review of Medication Regimen & Side Effects (2)  Treatment Plan Summary: Daily contact with patient to assess and evaluate symptoms and progress in treatment. Medication management: Lexapro 10mg  Qhs for depression.  Plan: Patient has capacity to make his own medical decisions and living arrangements Continue Lexapro 10 mg daily for depression as prescribed No evidence of imminent risk to self or others at present.   Patient does not meet criteria for psychiatric inpatient admission. Supportive therapy provided about ongoing stressors.   Disposition: Patient will be referred to the outpatient psychiatric services at [facility] and counseling services at [facility] [place]. Please contact psychiatric social service if needed further assistance.  Durward Parcel., MD 07/14/2014 10:28 AM

## 2014-07-14 NOTE — Progress Notes (Signed)
Subjective: Is amenable to transferring to psychiatry. Eating well.   Objective: Vital signs in last 24 hours: Filed Vitals:   07/13/14 2239 07/14/14 0603 07/14/14 0712 07/14/14 0901  BP: 129/87 138/95  128/95  Pulse: 83 82  84  Temp: 98.6 F (37 C) 98.4 F (36.9 C)    TempSrc: Oral Oral    Resp: 16 18    Height:      Weight:   84.324 kg (185 lb 14.4 oz)   SpO2: 99% 100%     Weight change:   Intake/Output Summary (Last 24 hours) at 07/14/14 1055 Last data filed at 07/14/14 0900  Gross per 24 hour  Intake    700 ml  Output   1375 ml  Net   -675 ml   General: Sitting in bed in no acute distress.  Pulm: Normal work of breathing; CTAB bilaterally  CV: RRR, no murmurs  Neuro: 5/5 strength in biceps/triceps/grip strength   Lab Results: BMET    Component Value Date/Time   NA 134* 07/14/2014 0755   K 4.1 07/14/2014 0755   CL 99 07/14/2014 0755   CO2 31 07/14/2014 0755   GLUCOSE 102* 07/14/2014 0755   BUN 19 07/14/2014 0755   CREATININE 0.94 07/14/2014 0755   CALCIUM 9.0 07/14/2014 0755   GFRNONAA >90 07/14/2014 0755   GFRAA >90 07/14/2014 0755   Micro Results: Recent Results (from the past 240 hour(s))  Culture, blood (routine x 2)     Status: None   Collection Time: 07/07/14  2:24 PM  Result Value Ref Range Status   Specimen Description BLOOD LEFT ARM  Final   Special Requests BOTTLES DRAWN AEROBIC AND ANAEROBIC  Final   Culture   Final    NO GROWTH 5 DAYS Performed at [facility]    Report Status 07/13/2014 FINAL  Final  Culture, blood (routine x 2)     Status: None   Collection Time: 07/07/14  2:30 PM  Result Value Ref Range Status   Specimen Description BLOOD BLOOD LEFT FOREARM  Final   Special Requests BOTTLES DRAWN AEROBIC AND ANAEROBIC  Final   Culture   Final    NO GROWTH 5 DAYS Performed at [facility]    Report Status 07/13/2014 FINAL  Final  MRSA PCR Screening     Status: None   Collection Time: 07/07/14  6:52 PM   Result Value Ref Range Status   MRSA by PCR NEGATIVE NEGATIVE Final    Comment:        The GeneXpert MRSA Assay (FDA approved for NASAL specimens only), is one component of a comprehensive MRSA colonization surveillance program. It is not intended to diagnose MRSA infection nor to guide or monitor treatment for MRSA infections.   Respiratory virus antigens panel     Status: None   Collection Time: 07/08/14  8:37 PM  Result Value Ref Range Status   Respiratory Syncytial Virus A Negative Negative Final   Respiratory Syncytial Virus B Negative Negative Final   Influenza A Negative Negative Final   Influenza B Negative Negative Final   Parainfluenza 1 Negative Negative Final   Parainfluenza 2 Negative Negative Final   Parainfluenza 3 Negative Negative Final   Metapneumovirus Negative Negative Final   Rhinovirus Negative Negative Final   Adenovirus Negative Negative Final    Comment: (NOTE) Performed At: [facility] [address] [place], [place] [id number] Mila Homer MD FB:[id number]   Culture, sputum-assessment  Status: None   Collection Time: 07/09/14  9:11 AM  Result Value Ref Range Status   Specimen Description SPUTUM  Final   Special Requests Immunocompromised  Final   Sputum evaluation   Final    THIS SPECIMEN IS ACCEPTABLE. RESPIRATORY CULTURE REPORT TO FOLLOW.   Report Status 07/09/2014 FINAL  Final  Pneumocystis smear by DFA     Status: None   Collection Time: 07/09/14  9:11 AM  Result Value Ref Range Status   Specimen Source-PJSRC SPUTUM  Final   Pneumocystis jiroveci Ag NEGATIVE  Final    Comment: Performed at [facility]  Culture, respiratory (NON-Expectorated)     Status: None   Collection Time: 07/09/14  9:11 AM  Result Value Ref Range Status   Specimen Description SPUTUM  Final   Special Requests NONE  Final   Gram Stain   Final    ABUNDANT WBC PRESENT, PREDOMINANTLY PMN NO SQUAMOUS EPITHELIAL CELLS SEEN FEW GRAM POSITIVE  COCCI IN PAIRS IN CHAINS IN CLUSTERS RARE GRAM POSITIVE RODS RARE GRAM NEGATIVE RODS    Culture   Final    NORMAL OROPHARYNGEAL FLORA Performed at [facility]    Report Status 07/12/2014 FINAL  Final   Studies/Results: No results found. Medications:  Scheduled Meds: . carvedilol  18.75 mg Oral BID WC  . dolutegravir  50 mg Oral Daily  . emtricitabine-tenofovir  1 tablet Oral Daily  . enoxaparin (LOVENOX) injection  40 mg Subcutaneous Q24H  . escitalopram  10 mg Oral QHS  . furosemide  40 mg Oral Once per day on Mon Thu  . hydrALAZINE  50 mg Oral 3 times per day  . insulin aspart  0-15 Units Subcutaneous TID WC  . insulin detemir  18 Units Subcutaneous QHS  . isosorbide mononitrate  30 mg Oral Daily  . losartan  50 mg Oral BID   Continuous Infusions:  PRN Meds:.acetaminophen, chlorpheniramine-HYDROcodone, guaiFENesin, ibuprofen, ipratropium-albuterol   Assessment/Plan:Shaun Brennan is a 39 yo male with a PMH significant for HIV, DM, HTN, and HFrEF who is admitted for treatment of CAP; now with suicidality limiting discharge.  Principal Problem:   Suicidal ideation Active Problems:   DM2 (diabetes mellitus, type 2)   Chronic systolic heart failure   Community acquired pneumonia   CAP (community acquired pneumonia)   Depression, major, single episode, moderate   Major depression  ** CAP: Initially presented with SOB, cough, hemoptysis, and pleuritic chest pain. Precipitating factors likely include tobacco use and immunosuppression 2/2 HIV with current CD4 count of 470. Now s/p 3 days of IV ceftriaxone and oral azithromycin with significant clinical improvement in fevers and initial hypoxia. Previously treated with IV ceftriaxone and azithromycin.Blood cultures with no growth x5 days. Streptococcus pneumoniae urinary antigen negative. Sputum DFA negative, influenza panel negative, legionella urinary antigen negative, RVP negative. -- Now s/p 7 day course of oral  azithromycin and augmentin (day 7/7) -- Will need follow-up CXR in 4 weeks.   ** Asymptomatic ventricular tachycardia: Overnight on 4/1 with asymptomatic non-sustained ventricular tachycardia; however, heart rate at the time was 82; irregular rhythm with v-tach did have rate up to 100. Overnight on 4/2 had additional episodes of PVCs, as well as reported non-sustained V-tach lasting and v-tach on the tele. Heart failure consulted and assessed as likely artifactual, recommended increasing carvedilol, and following electrolytes.  -- Heart failure c/s; appreciate reccs -- Magnesium 1.8; will defer repletion at this time.  -- Potassium 5.2 overnight, came down to 4.1 this  AM with normal SSI   ** HIV Infection: Followed with Dr. Ninetta Lights with ID and on truvada and tivicay with uncertain medication compliance. Last CD4 of 270 and viral load 10,387 in August of 2015. CD4 count this admission is 470 and viral load 60, suggesting improvement with initiation of HAART since last admission.  -- Continue home antiretrovirals -- F/u scheduled with Dr. Ninetta Lights on 4/12  ** Passive suicidality: Endorses some thoughts of self-harm, but does not appear to have an active plan. Psychiatry involvement and anti-depressants would likely be helpful. Will need to consider drug drug interactions between SSRIs and HIV medications.  -- Psychiatry consult; appreciate reccs -- Will need transfer to inpatient psych -- Now on lexapro 10 mg daily -- Chaplain consult  ** HFrEF, HTN: History of malignant HTN and non-ischemic cardiomyopathy felt to be most likely due to HTN and HIV. Clinically euvolemic on exam, though BNP was elevated on admission. Improved control with home hypertensive medications, and increase in losartan to BID dosing.  -- Continue home heart failure and HTN meds including carvedilol, furosemide, hydralazine, imdur, and losartan. -- Home carvedilol increased as above  ** Diabetes, type 2: Last A1c was  August '15 at 9.7. With metformin at home, A1c during this admission is now 8.6.  -- Insulin determir 18 units QHS -- SSI  ** FEN/GI:  -- Modified carb diet   ** PPx:  -- Subcu lovenox   ** Dispo:  -- Full code -- Awaiting transfer to inpatient psychiatry -- F/u with PCP Dr. Orpah Cobb on 4/8 -- F/u with Dr. Ninetta Lights w/ ID on 4/12 -- Needs cardiology f/u  This is a Medical Student Note.  The care of the patient was discussed with Dr. Virgina Organ and the assessment and plan formulated with their assistance.  Please see their attached note for official documentation of the daily encounter.   LOS: 7 days   Clide Cliff, Med Student 07/14/2014, 10:55 AM  [phone]

## 2014-07-14 NOTE — Progress Notes (Addendum)
Subjective: Mr. Gasparro was seen and examined at bedside. He is eating his breakfast and reports doing okay. He reports some mild right lower back pain that started last night which he thinks is because he may have slept on it wrong. Otherwise, denies fever, chills, still having some cough, but feels improved since admission. He still wishes to continue speaking with psychiatry and going to inpatient psychiatry.   Objective: Vital signs in last 24 hours: Filed Vitals:   07/13/14 1816 07/13/14 2239 07/14/14 0603 07/14/14 0712  BP: 134/94 129/87 138/95   Pulse: 86 83 82   Temp:  98.6 F (37 C) 98.4 F (36.9 C)   TempSrc:  Oral Oral   Resp:  16 18   Height:      Weight:    185 lb 14.4 oz (84.324 kg)  SpO2:  99% 100%    Weight change:   Intake/Output Summary (Last 24 hours) at 07/14/14 0725 Last data filed at 07/14/14 0603  Gross per 24 hour  Intake    696 ml  Output   1625 ml  Net   -929 ml   Vitals reviewed. Sitter by bedside General: sitting up in bed, NAD HEENT: EOMI Cardiac: RRR Pulm: clear to auscultation bilaterally Abd: soft, BS present Ext: -tenderness to palpation, moving all extremities, -tenderness to palpation of right lower back but says feels sore when he stretches backwards Neuro: alert and awake, talking in full sentences, answering questions appropriately  Lab Results: Basic Metabolic Panel:  Recent Labs Lab 07/13/14 1427 07/14/14 0755  NA 131* 134*  K 5.2* 4.1  CL 99 99  CO2 18* 31  GLUCOSE 293* 102*  BUN 20 19  CREATININE 1.03 0.94  CALCIUM 9.3 9.0  MG 1.9 1.8   Liver Function Tests:  Recent Labs Lab 07/07/14 1224 07/08/14 0320  AST 46* 29  ALT 46 33  ALKPHOS 100 77  BILITOT 0.7 0.7  PROT 7.9 7.7  ALBUMIN 3.3* 2.8*   CBC:  Recent Labs Lab 07/08/14 1114 07/09/14 0442 07/10/14 0600  WBC 12.1* 10.2 7.2  NEUTROABS 9.9*  --   --   HGB 13.8 13.4 13.2  HCT 40.0 39.4 39.2  MCV 96.9 97.3 97.5  PLT 116* 122* 133*   CBG:  Recent  Labs Lab 07/12/14 1720 07/12/14 2208 07/13/14 0800 07/13/14 1206 07/13/14 1723 07/13/14 2243  GLUCAP 235* 269* 168* 183* 257* 224*   Hemoglobin A1C:  Recent Labs Lab 07/08/14 1128  HGBA1C 8.6*   Urine Drug Screen: Drugs of Abuse     Component Value Date/Time   LABOPIA NONE DETECTED 12/20/2013 1321   COCAINSCRNUR NONE DETECTED 12/20/2013 1321   LABBENZ NONE DETECTED 12/20/2013 1321   AMPHETMU NONE DETECTED 12/20/2013 1321   THCU POSITIVE* 12/20/2013 1321   LABBARB NONE DETECTED 12/20/2013 1321    Micro Results: Recent Results (from the past 240 hour(s))  Culture, blood (routine x 2)     Status: None   Collection Time: 07/07/14  2:24 PM  Result Value Ref Range Status   Specimen Description BLOOD LEFT ARM  Final   Special Requests BOTTLES DRAWN AEROBIC AND ANAEROBIC  Final   Culture   Final    NO GROWTH 5 DAYS Performed at [facility]    Report Status 07/13/2014 FINAL  Final  Culture, blood (routine x 2)     Status: None   Collection Time: 07/07/14  2:30 PM  Result Value Ref Range Status   Specimen Description BLOOD BLOOD  LEFT FOREARM  Final   Special Requests BOTTLES DRAWN AEROBIC AND ANAEROBIC  Final   Culture   Final    NO GROWTH 5 DAYS Performed at [facility]    Report Status 07/13/2014 FINAL  Final  MRSA PCR Screening     Status: None   Collection Time: 07/07/14  6:52 PM  Result Value Ref Range Status   MRSA by PCR NEGATIVE NEGATIVE Final    Comment:        The GeneXpert MRSA Assay (FDA approved for NASAL specimens only), is one component of a comprehensive MRSA colonization surveillance program. It is not intended to diagnose MRSA infection nor to guide or monitor treatment for MRSA infections.   Respiratory virus antigens panel     Status: None   Collection Time: 07/08/14  8:37 PM  Result Value Ref Range Status   Respiratory Syncytial Virus A Negative Negative Final   Respiratory Syncytial Virus B Negative  Negative Final   Influenza A Negative Negative Final   Influenza B Negative Negative Final   Parainfluenza 1 Negative Negative Final   Parainfluenza 2 Negative Negative Final   Parainfluenza 3 Negative Negative Final   Metapneumovirus Negative Negative Final   Rhinovirus Negative Negative Final   Adenovirus Negative Negative Final    Comment: (NOTE) Performed At: [facility] [address] [place], [place] [id number] Mila Homer MD WU:[id number]   Culture, sputum-assessment     Status: None   Collection Time: 07/09/14  9:11 AM  Result Value Ref Range Status   Specimen Description SPUTUM  Final   Special Requests Immunocompromised  Final   Sputum evaluation   Final    THIS SPECIMEN IS ACCEPTABLE. RESPIRATORY CULTURE REPORT TO FOLLOW.   Report Status 07/09/2014 FINAL  Final  Pneumocystis smear by DFA     Status: None   Collection Time: 07/09/14  9:11 AM  Result Value Ref Range Status   Specimen Source-PJSRC SPUTUM  Final   Pneumocystis jiroveci Ag NEGATIVE  Final    Comment: Performed at [facility]  Culture, respiratory (NON-Expectorated)     Status: None   Collection Time: 07/09/14  9:11 AM  Result Value Ref Range Status   Specimen Description SPUTUM  Final   Special Requests NONE  Final   Gram Stain   Final    ABUNDANT WBC PRESENT, PREDOMINANTLY PMN NO SQUAMOUS EPITHELIAL CELLS SEEN FEW GRAM POSITIVE COCCI IN PAIRS IN CHAINS IN CLUSTERS RARE GRAM POSITIVE RODS RARE GRAM NEGATIVE RODS    Culture   Final    NORMAL OROPHARYNGEAL FLORA Performed at [facility]    Report Status 07/12/2014 FINAL  Final   Medications: I have reviewed the patient's current medications. Scheduled Meds: . carvedilol  18.75 mg Oral BID WC  . dolutegravir  50 mg Oral Daily  . emtricitabine-tenofovir  1 tablet Oral Daily  . enoxaparin (LOVENOX) injection  40 mg Subcutaneous Q24H  . escitalopram  10 mg Oral QHS  . furosemide  40 mg Oral Once per day on Mon Thu    . hydrALAZINE  50 mg Oral 3 times per day  . insulin aspart  0-15 Units Subcutaneous TID WC  . insulin detemir  18 Units Subcutaneous QHS  . isosorbide mononitrate  30 mg Oral Daily  . losartan  50 mg Oral BID   Continuous Infusions:  PRN Meds:.acetaminophen, chlorpheniramine-HYDROcodone, guaiFENesin, ibuprofen, ipratropium-albuterol Assessment/Plan: Principal Problem:   CAP (community acquired pneumonia) Active Problems:   DM2 (  diabetes mellitus, type 2)   Chronic systolic heart failure   Community acquired pneumonia   Depression, major, single episode, moderate  CAP--completed 7 day course of antibiotics. Still having occasional cough.   SI in setting of MDD-Psychiatry following, on Lexapro.  -Awaiting inpatient psych bed when available--patient is medically stable for transfer -Chaplain support -Suicide precautions to continue, sitter by bedside -Continue Lexapro  Non-sustained Vtach in setting of NICM and chronic systolic CHF-seen by cardiology 4/6, review of telemetry showed artifact. Coreg dose was increased, okay with transfer to behavioral health on current cardiac meds. Echo 04/2014 showed EF 30-35% with diffuse hypokinesis -Continue carvedilol, lasix, hydralazine, imdur, and losartan  DM2--HbA1C 8.6 on 07/08/14. Metformin listed as home medication.  -On Levemir 18 units and SSI moderate -CBG monitoring  HIV -continue current meds--on Truvada, Tivicay  Homelessness -SW consulted this admission--referral made to partners ending homelessness and also provided bus passes  Back Pain--acute onset, started last night, mild, 5/6 per patient, non-tender to palpation but feels sore. ?MSK. He denies any dysuria and thinks it is secondary to sleeping wrong.  -will continue to monitor, encouraged patient to ambulate as tolerated  Diet: Carb mod DVT Ppx: Lovenox Dispo: Disposition is deferred at this time, awaiting improvement of current medical problems.  Anticipated discharge  pending inpatient psychiatry bed availability.   The patient does have a current PCP (Doris Cheadle, MD) and does not need an [facility] hospital follow-up appointment after discharge.  The patient does not have transportation limitations that hinder transportation to clinic appointments.  Services Needed at time of discharge: Y = Yes, Blank = No PT:   OT:   RN:   Equipment:   Other:     LOS: 7 days   Baltazar Apo, MD 07/14/2014, 7:25 AM

## 2014-07-15 ENCOUNTER — Emergency Department (HOSPITAL_COMMUNITY)
Admission: EM | Admit: 2014-07-15 | Discharge: 2014-07-15 | Disposition: A | Discharge status: Home / Self Care | Payer: Self-pay | Attending: Emergency Medicine | Admitting: Emergency Medicine

## 2014-07-15 ENCOUNTER — Encounter (HOSPITAL_COMMUNITY): Payer: Self-pay | Admitting: Family Medicine

## 2014-07-15 DIAGNOSIS — I5022 Chronic systolic (congestive) heart failure: Secondary | ICD-10-CM | POA: Insufficient documentation

## 2014-07-15 DIAGNOSIS — E11649 Type 2 diabetes mellitus with hypoglycemia without coma: Secondary | ICD-10-CM | POA: Insufficient documentation

## 2014-07-15 DIAGNOSIS — E162 Hypoglycemia, unspecified: Secondary | ICD-10-CM

## 2014-07-15 DIAGNOSIS — I10 Essential (primary) hypertension: Secondary | ICD-10-CM | POA: Insufficient documentation

## 2014-07-15 DIAGNOSIS — Z21 Asymptomatic human immunodeficiency virus [HIV] infection status: Secondary | ICD-10-CM | POA: Insufficient documentation

## 2014-07-15 DIAGNOSIS — Z720 Tobacco use: Secondary | ICD-10-CM | POA: Insufficient documentation

## 2014-07-15 DIAGNOSIS — Z79899 Other long term (current) drug therapy: Secondary | ICD-10-CM | POA: Insufficient documentation

## 2014-07-15 LAB — GLUCOSE, CAPILLARY
GLUCOSE-CAPILLARY: 260 mg/dL — AB (ref 70–99)
GLUCOSE-CAPILLARY: 101 mg/dL — AB (ref 70–99)
Glucose-Capillary: 252 mg/dL — ABNORMAL HIGH (ref 70–99)
Glucose-Capillary: 106 mg/dL — ABNORMAL HIGH (ref 70–99)
Glucose-Capillary: 65 mg/dL — ABNORMAL LOW (ref 70–99)

## 2014-07-15 MED ORDER — ESCITALOPRAM OXALATE 10 MG PO TABS: 10.0000 mg | ORAL_TABLET | Freq: Every day | ORAL | Status: DC

## 2014-07-15 MED ORDER — CARVEDILOL 6.25 MG PO TABS: 18.7500 mg | ORAL_TABLET | Freq: Two times a day (BID) | ORAL | Status: DC

## 2014-07-15 NOTE — Progress Notes (Signed)
Inpatient Diabetes Program Recommendations  AACE/ADA: New Consensus Statement on Inpatient Glycemic Control (2013)  Target Ranges:  Prepandial:   less than 140 mg/dL      Peak postprandial:   less than 180 mg/dL (1-2 hours)      Critically ill patients:  140 - 180 mg/dL   Fasting glucose values are well controlled, but following meals the glucose elevates into the 200's until the am. Please order3-4 units tid wc (given only if pt eats >/= 50%.  Thank you Lenor Coffin, RN, MSN, CDE  Diabetes Inpatient Program Office: [phone] Pager: [phone] 8:00 am to 5:00 pm

## 2014-07-15 NOTE — Consult Note (Signed)
Shaun Brennan Shaun Aveena Bari EdD 

## 2014-07-15 NOTE — Progress Notes (Signed)
    Subjective:   VSS. He is with the sitter in his room.  He seems agitated this AM when we enter the room and wake him up.       Objective:   Physical Exam: Constitutional: Vital signs reviewed. Patient is lying in bed in no acute distress and cooperative with exam.  HEENT: [place]/AT; EOMI, conjunctivae normal, no scleral icterus  Pulmonary/Chest: normal respiratory effort Neurological: A&O x3, CN II-XII grossly intact Extremities: no C/C/E  Skin: Intact.    Psych: Agitated.     Assessment/Plan:   Principal Problem:  CAP (community acquired pneumonia) Active Problems:  DM2 (diabetes mellitus, type 2)  Chronic systolic heart failure  Community acquired pneumonia  Depression, major, single episode, moderate  CAP- Improving, still residual cough.  -finished course of augmentin and azithromycin for a total of 7 days   SI- Psychiatry initially recommended inpatient stay but no doesn't meet inpatient criteria.  He was started on lexapro.   -cont lexapro -given resources for outpatient psychiatry/counseling   Non-sustained Vtach- Resolved; pt has a h/o HF and VT is not surprising in the setting of NICM. -consulted HF team and increased carvedilol   HIV- -continue current meds; has assistance with meds from and outside source-->SW consulted  Homelessness- -consulted SW and was given options for shelters and additional support   Disposition Likely discharge today.    Marrian Salvage, MD PGY-2, Internal Medicine Teaching Service 11:33 AM

## 2014-07-15 NOTE — ED Provider Notes (Signed)
CSN: [id number]     Arrival date & time 07/15/14  1452 History   First MD Initiated Contact with Patient 07/15/14 1530     Chief Complaint  Patient presents with  . Weakness     Patient is a 39 y.o. male presenting with weakness. The history is provided by the patient. No language interpreter was used.  Weakness   Shaun Brennan presents for evaluation of low blood sugar. He was just discharged from the hospital for pneumonia hours prior to ED arrival. He was leaving the hospital and he became diaphoretic and nauseous. Sugar was 68 at that time. He states that he received a dose of insulin prior to discharge and he did not eat his lunch. He typically takes metformin for his diabetes. He currently is feeling well and just ate a snack and has no complaints and wishes to go home. Symptoms are mild, intermittent, resolving.  Past Medical History  Diagnosis Date  . HIV (human immunodeficiency virus infection)   . Diabetes mellitus without complication   . Hypertension   . Chronic systolic heart failure     a. EF 15-20%, grade II DD, LA mild/mod dilated  . DM2 (diabetes mellitus, type 2)    Past Surgical History  Procedure Laterality Date  . Ankle surgery Right    Family History  Problem Relation Age of Onset  . Other Father   . Diabetes Mother   . Stroke Paternal Grandfather    History  Substance Use Topics  . Smoking status: Current Every Day Smoker -- 0.25 packs/day    Types: Cigarettes  . Smokeless tobacco: Never Used     Comment: about 10 cigarettes a week  . Alcohol Use: Yes     Comment: socially about 6 pack per week     Review of Systems  Neurological: Positive for weakness.  All other systems reviewed and are negative.     Allergies  Lisinopril  Home Medications   Prior to Admission medications   Medication Sig Start Date End Date Taking? Authorizing Provider  carvedilol (COREG) 6.25 MG tablet Take 3 tablets (18.75 mg total) by mouth 2 (two) times daily with a  meal. 07/15/14   Jones Bales, MD  dolutegravir (TIVICAY) 50 MG tablet Take 1 tablet (50 mg total) by mouth daily. 02/04/14   Thayer Headings, MD  emtricitabine-tenofovir (TRUVADA) 200-300 MG per tablet Take 1 tablet by mouth daily. 02/04/14   Thayer Headings, MD  escitalopram (LEXAPRO) 10 MG tablet Take 1 tablet (10 mg total) by mouth at bedtime. 07/15/14   Jones Bales, MD  furosemide (LASIX) 40 MG tablet Take only on Mondays and Fridays 03/25/14   Rande Brunt, NP  glucose monitoring kit (FREESTYLE) monitoring kit 1 each by Does not apply route 4 (four) times daily - after meals and at bedtime. 1 month Diabetic Testing Supplies for QAC-QHS accuchecks. 12/12/13   Lorayne Marek, MD  hydrALAZINE (APRESOLINE) 50 MG tablet Take 1 tablet (50 mg total) by mouth every 8 (eight) hours. 03/25/14   Rande Brunt, NP  isosorbide mononitrate (IMDUR) 30 MG 24 hr tablet Take 1 tablet (30 mg total) by mouth daily. 03/25/14   Rande Brunt, NP  losartan (COZAAR) 50 MG tablet Take 1 tablet (50 mg total) by mouth daily. 04/27/14   Jolaine Artist, MD  metFORMIN (GLUCOPHAGE) 500 MG tablet Take 1 tablet (500 mg total) by mouth 2 (two) times daily with a meal. 03/25/14   Deepak  Advani, MD   BP 147/97 mmHg  Pulse 55  Temp(Src) 97.3 F (36.3 C) (Oral)  Resp 18  SpO2 100% Physical Exam  Constitutional: He is oriented to person, place, and time. He appears well-developed and well-nourished.  HENT:  Head: Normocephalic and atraumatic.  Cardiovascular: Normal rate and regular rhythm.   No murmur heard. Pulmonary/Chest: Effort normal and breath sounds normal. No respiratory distress.  Abdominal: Soft. There is no tenderness. There is no rebound and no guarding.  Musculoskeletal: He exhibits no edema or tenderness.  Neurological: He is alert and oriented to person, place, and time.  Skin: Skin is warm and dry.  Psychiatric: He has a normal mood and affect. His behavior is normal.  Nursing note and vitals  reviewed.   ED Course  Procedures (including critical care time) Labs Review Labs Reviewed - No data to display  Imaging Review No results found.   EKG Interpretation None      MDM   Final diagnoses:  Hypoglycemia    Patient with history of HIV and diabetes here for hypoglycemic episode. He did take a dose of insulin prior to episode without eating. Patient feels improved after orange juice and graham crackers in the emergency department and is requesting to leave. Recommend observation to evaluate for repeat current hypoglycemia and patient refused to stay for further evaluation. Discussed with patient importance of eating and following his blood sugars, PCP follow-up and return precautions.  Quintella Reichert, MD 07/15/14 1710

## 2014-07-15 NOTE — Discharge Instructions (Signed)
Hypoglycemia °Hypoglycemia occurs when the glucose in your blood is too low. Glucose is a type of sugar that is your body's main energy source. Hormones, such as insulin and glucagon, control the level of glucose in the blood. Insulin lowers blood glucose and glucagon increases blood glucose. Having too much insulin in your blood stream, or not eating enough food containing sugar, can result in hypoglycemia. Hypoglycemia can happen to people with or without diabetes. It can develop quickly and can be a medical emergency.  °CAUSES  °· Missing or delaying meals. °· Not eating enough carbohydrates at meals. °· Taking too much diabetes medicine. °· Not timing your oral diabetes medicine or insulin doses with meals, snacks, and exercise. °· Nausea and vomiting. °· Certain medicines. °· Severe illnesses, such as hepatitis, kidney disorders, and certain eating disorders. °· Increased activity or exercise without eating something extra or adjusting medicines. °· Drinking too much alcohol. °· A nerve disorder that affects body functions like your heart rate, blood pressure, and digestion (autonomic neuropathy). °· A condition where the stomach muscles do not function properly (gastroparesis). Therefore, medicines and food may not absorb properly. °· Rarely, a tumor of the pancreas can produce too much insulin. °SYMPTOMS  °· Hunger. °· Sweating (diaphoresis). °· Change in body temperature. °· Shakiness. °· Headache. °· Anxiety. °· Lightheadedness. °· Irritability. °· Difficulty concentrating. °· Dry mouth. °· Tingling or numbness in the hands or feet. °· Restless sleep or sleep disturbances. °· Altered speech and coordination. °· Change in mental status. °· Seizures or prolonged convulsions. °· Combativeness. °· Drowsiness (lethargic). °· Weakness. °· Increased heart rate or palpitations. °· Confusion. °· Pale, gray skin color. °· Blurred or double vision. °· Fainting. °DIAGNOSIS  °A physical exam and medical history will be  performed. Your caregiver may make a diagnosis based on your symptoms. Blood tests and other lab tests may be performed to confirm a diagnosis. Once the diagnosis is made, your caregiver will see if your signs and symptoms go away once your blood glucose is raised.  °TREATMENT  °Usually, you can easily treat your hypoglycemia when you notice symptoms. °· Check your blood glucose. If it is less than 70 mg/dl, take one of the following:   °¨ 3-4 glucose tablets.   °¨ ½ cup juice.   °¨ ½ cup regular soda.   °¨ 1 cup skim milk.   °¨ ½-1 tube of glucose gel.   °¨ 5-6 hard candies.   °· Avoid high-fat drinks or food that may delay a rise in blood glucose levels. °· Do not take more than the recommended amount of sugary foods, drinks, gel, or tablets. Doing so will cause your blood glucose to go too high.   °· Wait 10-15 minutes and recheck your blood glucose. If it is still less than 70 mg/dl or below your target range, repeat treatment.   °· Eat a snack if it is more than 1 hour until your next meal.   °There may be a time when your blood glucose may go so low that you are unable to treat yourself at home when you start to notice symptoms. You may need someone to help you. You may even faint or be unable to swallow. If you cannot treat yourself, someone will need to bring you to the hospital.  °HOME CARE INSTRUCTIONS °· If you have diabetes, follow your diabetes management plan by: °¨ Taking your medicines as directed. °¨ Following your exercise plan. °¨ Following your meal plan. Do not skip meals. Eat on time. °¨ Testing your blood   glucose regularly. Check your blood glucose before and after exercise. If you exercise longer or different than usual, be sure to check blood glucose more frequently. °¨ Wearing your medical alert jewelry that says you have diabetes. °· Identify the cause of your hypoglycemia. Then, develop ways to prevent the recurrence of hypoglycemia. °· Do not take a hot bath or shower right after an  insulin shot. °· Always carry treatment with you. Glucose tablets are the easiest to carry. °· If you are going to drink alcohol, drink it only with meals. °· Tell friends or family members ways to keep you safe during a seizure. This may include removing hard or sharp objects from the area or turning you on your side. °· Maintain a healthy weight. °SEEK MEDICAL CARE IF:  °· You are having problems keeping your blood glucose in your target range. °· You are having frequent episodes of hypoglycemia. °· You feel you might be having side effects from your medicines. °· You are not sure why your blood glucose is dropping so low. °· You notice a change in vision or a new problem with your vision. °SEEK IMMEDIATE MEDICAL CARE IF:  °· Confusion develops. °· A change in mental status occurs. °· The inability to swallow develops. °· Fainting occurs. °Document Released: 03/27/2005 Document Revised: 04/01/2013 Document Reviewed: 07/24/2011 °ExitCare® Patient Information ©2015 [employer]. This information is not intended to replace advice given to you by your health care provider. Make sure you discuss any questions you have with your health care provider. ° °

## 2014-07-15 NOTE — Clinical Social Work Psych Note (Addendum)
Psych CSW met with patient at bedside.  Patient was sleeping soundly, but easily aroused.  Psych reviewed the psychiatrist recommendation of outpatient followup for medication management at [facility] and counseling services at [facility].  Patient is agreeable to follow-up.  This information was placed on the patient's discharge instructions.  Patient confirmed the receipt of at least two bus passes from unit CSW.  No other CSW/psych CSW needs identified at this time.  [facility]- med management- M-F walk-in only 8:30-3 initial assessment [facility]- M-F walk-in only 8:30-12; 1-2:30- counseling  Nonnie Done, [place] [phone]  Psychiatric & Orthopedics (5N 1-8) Clinical Social Worker

## 2014-07-15 NOTE — ED Notes (Signed)
[facility] 101 

## 2014-07-15 NOTE — Discharge Instructions (Signed)
Please keep your follow-up appointments; this is very important for your continued recovery.   ° °We have made the following additions/changes to your medications:  °Please refer to your medication list.   ° °Please continue to take all of your medications as prescribed.  Do not miss any doses without contacting your primary physician.  If you have questions, please contact your physician or contact the Internal Medicine Teaching Service at [phone]. ° °Please bring your medicications with you to your appointments; medications may be eye drops, herbals, vitamins, or pills.   ° °If you believe you are suffering from a life-threatening emergency, go to the nearest Emergency Department.   ° ° ° °Community Resource Guide ° °1) Find a Doctor and Pay Out of Pocket °Although you won't have to find out who is covered by your insurance plan, it is a good idea to ask around and get recommendations. You will then need to call the office and see if the doctor you have chosen will accept you as a new patient and what types of options they offer for patients who are self-pay. Some doctors offer discounts or will set up payment plans for their patients who do not have insurance, but you will need to ask so you aren't surprised when you get to your appointment. ° °2) Contact Your Local Health Department °Not all health departments have doctors that can see patients for sick visits, but many do, so it is worth a call to see if yours does. If you don't know where your local health department is, you can check in your phone book. The CDC also has a tool to help you locate your state's health department, and many state websites also have listings of all of their local health departments. ° °3) Find a Walk-in Clinic °If your illness is not likely to be very severe or complicated, you may want to try a walk in clinic. These are popping up all over the country in pharmacies, drugstores, and shopping centers. They're usually staffed by  nurse practitioners or physician assistants that have been trained to treat common illnesses and complaints. They're usually fairly quick and inexpensive. However, if you have serious medical issues or chronic medical problems, these are probably not your best option. ° °No Primary Care Doctor: °- Call Health Connect at  832-8000 - they can help you locate a primary care doctor that  accepts your insurance, provides certain services, etc. °- Physician Referral Service- 1-800-533-3463 ° °Chronic Pain Problems: °Organization         Address  Phone   Notes  °[facility]  (336) 297-2271 Patients need to be referred by their primary care doctor.  ° °Medication Assistance: °Organization         Address  Phone   Notes  °[place] [facility] [address] °[place], [place] [address] (336) 641-8030 --Must be a resident of [place] °-- Must have NO insurance coverage whatsoever (no Medicaid/ Medicare, etc.) °-- The pt. MUST have a primary care doctor that directs their care regularly and follows them in the community °  °[facility]  (866) 331-1348   °[employer]  (888) 892-1162   ° °Agencies that provide inexpensive medical care: °Organization         Address  Phone   Notes  °[facility]  (336) 832-8035   °[facility]    (336) 832-7272   °[facility] [address] °[place], [place] [address] (336) 832-4777   °[facility] [address], °[place] (336) 271-4999   °[facility]    (336) 373-0678   °[facility]    (336) 272-1050   °[facility] ° [address], [place] Phone:  (336) 832-4444, Fax:  (336) 832-4440 Hours of Operation:  9 am - 6 pm, M-F.  Also accepts Medicaid/Medicare and self-pay.  °[facility] ° [address], [place] Phone: (336) [phone], Fax: (336) [phone]. Hours of Operation:  8:30  am - 5:30 pm, M-F.  Also accepts Medicaid and self-pay.  °[facility] [place] [address], [place] Phone: (336) 878-6027   °[facility] [address], [place], [place] (336)723-1848, Ext. 123 Mondays & Thursdays: 7-9 AM.  First 15 patients are seen on a first come, first serve basis. °  ° °Medicaid-accepting [place] Providers: ° °Organization         Address  Phone   Notes  °[facility] [address], [place] (336) 641-2100 Also accepts self-pay patients.  °[facility] [address], [place] ° (336) 856-9996   °[facility] [address], [place] (336) 288-8857   °[facility] [address] [place] [address], [place] (336) 299-7000   °Veita Bland [address], [place]  ° (336) 373-1557 Only accepts [place] Access Medicaid patients after they have their name applied to their card.  ° °Self-Pay (no insurance) in [place]: ° °Organization         Address  Phone   Notes  °Sickle Cell Patients, [facility] [address], [place] (336) 832-1970   °[facility] [address], [place] (336) 832-4400   °[facility] [place] ° [address], [place] (336) 992-4800   °[facility]/Dr. Osei-Bonsu ° [address] [place] [address], [place] or [address], [place] (336) 841-8500 Phone number for both [place] and [place] locations is the same.  °[facility] [address], [place] (336) 299-0000   °[facility] [address] [place] [address], [place] or [address] (336) 852-7530 °(336) 878-2260   °[facility] [address], [place] (336) 350-1642, phone; (336) 294-5005, fax Sees patients 1st and 3rd Saturday of every month.  Must not qualify for public or private insurance (i.e. Medicaid, Medicare, [place] [facility]  Choice, Veterans' Benefits) • Household income should be no more than 200% of the poverty level •The clinic cannot treat you if you are pregnant or think you are pregnant • Sexually transmitted diseases are not treated at the clinic.  ° ° °Dental Care: °Organization         Address  Phone  Notes  °[facility] [address], [place] (336) 641-6152 Accepts children up to age 21 who are enrolled in Medicaid or [place] Health Choice; pregnant women with a Medicaid card; and children who have applied for Medicaid or  Health Choice, but were declined, whose parents can pay a reduced fee at time of service.  °[facility] [place]  [address], [place] (336) 641-7733 Accepts children up to age 21 who are enrolled in Medicaid or    Health Choice; pregnant women with a Medicaid card; and children who have applied for Medicaid or [place] Health Choice, but were declined, whose parents can pay a reduced fee at time of service.  °[facility] ° [address], [place] (336) 641-4533 Patients are seen by appointment only. Walk-ins are not accepted. [facility] will see patients 18 years of age and older. °Monday - Tuesday (8am-5pm) °Most Wednesdays (8:30-5pm) °$30 per visit, cash only  °[facility] ° [address], [place] (336) 641-4533 Patients are seen by appointment only. Walk-ins are not accepted. [facility] will see patients 18 years of age and older. °One Wednesday Evening (Monthly: Volunteer Based).  $30 per visit, cash only  °[facility]  (919) 537-3737 for adults; Children under age 4, call Graduate Pediatric Dentistry at ([phone]) [phone]. Children aged 4-14, please call (919) [phone] to request a pediatric application. ° Dental services are provided in all areas of dental care including fillings, crowns and bridges, complete  and partial dentures, implants, gum treatment, root canals, and extractions. Preventive care is also provided. Treatment is provided to both adults and children. °Patients are selected via a lottery and there is often a waiting list. °  °[facility] [address], °[place] ° (336) 763-8833 [email or web address] °  °[facility] [address], [place], [place] (336)723-1848, Ext. 123 Second and Fourth Thursday of each month, opens at 6:30 AM; Clinic ends at 9 AM.  Patients are seen on a first-come first-served basis, and a limited number are seen during each clinic.  ° °[facility] ° [address], [place], [place] (336) 723-7904   Eligibility Requirements °You must have lived in [place], [place], or [place] counties for at least the last three months. °  You cannot be eligible for state or federal sponsored healthcare insurance, including Veterans Administration, Medicaid, or Medicare. °  You generally cannot be eligible for healthcare insurance through your employer.  °  How to apply: °Eligibility screenings are held every Tuesday and Wednesday afternoon from 1:00 pm until 4:00 pm. You do not need an appointment for the interview!  °[facility] [address], [place], [place] 336-631-2330   °[facility]  336-342-8273   °[facility]  336-703-3100   °[facility]  336-570-6415   ° °Behavioral Health Resources in the Community: °Intensive Outpatient Programs °Organization         Address  Phone  Notes  °[facility] [address], [place], [place] 336-878-6098   °[facility] [address], [place], [place] 336-832-9800   °[facility] [address], [place], [place] ° 336-882-2125   °[facility] [address],  °[place], [place] 1-800-853-5163 or 336-641-4981   °Substance Abuse Resources °Organization          Address  Phone  Notes  °[facility]  336-882-2125   °[facility]  336-784-9470   °[facility]  336-285-9073   °[facility]  336-845-3988   °Residential & Outpatient Substance Abuse Program  1-800-659-3381   °Psychological Services °Organization         Address  Phone  Notes  °[facility]  336- 832-9600   °[facility]  336- 378-7881   °[facility] [address], [place] 1-800-853-5163 or 336-641-4981   ° °  Mobile Crisis Teams °Organization         Address  Phone  Notes  °[facility], Mobile Crisis Care Unit  1-877-626-1772   °Assertive °[facility] ° [address] [place], [place] 336-834-9664   °Sharon DeEsch [address] °[place] 336-554-5454   ° °Self-Help/Support Groups °Organization         Address  Phone             Notes  °[facility] [place] [facility] - variety of support groups  336- 373-1402 Call for more information  °Narcotics Anonymous (NA), [facility] [address], °[place]  2 meetings at this location  ° °Residential Treatment Programs °Organization         Address  Phone  Notes  °[facility] [address],    °[place]  1-866-801-8205   °[facility] ° [address], [place], [place] 704-293-8524   °[facility] [address], [place] 336-845-3988 Admissions: 8am-3pm M-F  °[facility] [address]    °[place], [place] 336-841-1104   °[facility] [address], [place], [place] 336-379-7146   °[facility] [address]  °[place], [place] 336-285-9073   °[facility] - Intensive Outpatient [address], [place], [place] 336-852-3033   °[facility] [address]  °[place], [place] 1-877-615-2722 or 336-784-9470   °[facility] ([facility] [address] , [place]  336-227-7417 Accepts Medicaid  °[facility] [address]  °[place] 1-800-659-3381 Substance Abuse/Addiction Treatment  ° °[place] Behavioral Health Resources °Organization         Address  Phone  Notes  °[facility]  (888) 581-9988   °Julie Brannon, PhD [address] [place], [place]   (336) 349-5553 or (336) 951-0000   °[facility]   [address] °[place], [place] (336) 349-4454   °[facility] [address], [place], [place] (336) 342-8316 Insurance/Medicaid/sponsorship through [facility]  °[facility] [address]                                    [place], [place] (336) 342-8316 Therapy/tele-psych/case  °[facility] [address]  ° [place], [place] (336) 349-2233    °Dr. Arfeen  (336) 349-4544   °[facility] [place]  [employer] [place] [facility] 1) [address], [place] °2) [address], [place] °3)  [address], [place] (336) 349-3220 °(336) 342-7768 ° °(336) 342-8140   °[place] [facility] (336) 342-1394 or (336) 342-3537 (After Hours)    ° ° ° ° °

## 2014-07-15 NOTE — Progress Notes (Signed)
Subjective: Seen again by psychiatry 4/5 who recommended no inpatient therapy at this time. The patient denied suicidality, but endorses depression.   Diabetes management also saw the patient and recommended novolog 4 units TID with meals.   Afebrile overnight with normal vitals. Endorsed improving cough with SOB only associated with cough; denied fevers, chills, or sweats. This morning, denied thoughts of hurting himself and states that he is feeling better. States that he never said he wanted to hurt himself.   Objective: Vital signs in last 24 hours: Filed Vitals:   07/14/14 1704 07/14/14 2122 07/15/14 0516 07/15/14 0720  BP: 118/67 134/80 123/82   Pulse: 93 86 78   Temp:  98.3 F (36.8 C) 98.1 F (36.7 C)   TempSrc:  Oral Oral   Resp:  18 18   Height:      Weight:    82.5 kg (181 lb 14.1 oz)  SpO2:  100% 99%    Weight change:   Intake/Output Summary (Last 24 hours) at 07/15/14 0740 Last data filed at 07/15/14 0500  Gross per 24 hour  Intake    600 ml  Output    750 ml  Net   -150 ml   General: Lying in bed in no acute distress.  CV: RRR, no murmurs Pulm: CTAB Abdomen: Normal active bowel sounds, soft, non-tender Extremities: No lower extremity edema Psych: Restricted affect, affect is frustrated. Makes eye contact; cooperative with exam.   Lab Results: None  Micro Results: Reviewed in EPIC.   Medications: Scheduled Meds: . carvedilol  18.75 mg Oral BID WC  . dolutegravir  50 mg Oral Daily  . emtricitabine-tenofovir  1 tablet Oral Daily  . enoxaparin (LOVENOX) injection  40 mg Subcutaneous Q24H  . escitalopram  10 mg Oral QHS  . furosemide  40 mg Oral Once per day on Mon Thu  . hydrALAZINE  50 mg Oral 3 times per day  . insulin aspart  0-15 Units Subcutaneous TID WC  . insulin detemir  18 Units Subcutaneous QHS  . isosorbide mononitrate  30 mg Oral Daily  . losartan  50 mg Oral BID   Continuous Infusions:  PRN Meds:.acetaminophen,  chlorpheniramine-HYDROcodone, guaiFENesin, ibuprofen, ipratropium-albuterol   Assessment/Plan: Shaun Brennan is a 39 yo male with a PMH significant for HIV, DM, HTN, and HFrEF who is admitted for treatment of CAP, depression, and suicidal thoughts.   Principal Problem:   Suicidal ideation Active Problems:   DM2 (diabetes mellitus, type 2)   Chronic systolic heart failure   Community acquired pneumonia   CAP (community acquired pneumonia)   Depression, major, single episode, moderate   Major depression  ** CAP: Initially presented with SOB, cough, hemoptysis, and pleuritic chest pain. Precipitating factors likely include tobacco use and immunosuppression 2/2 HIV with current CD4 count of 470. Now s/p 3 days of IV ceftriaxone and oral azithromycin with significant clinical improvement in fevers and initial hypoxia. Previously treated with IV ceftriaxone and azithromycin.Blood cultures with no growth x5 days. Streptococcus pneumoniae urinary antigen negative. Sputum DFA negative, influenza panel negative, legionella urinary antigen negative, RVP negative. -- Now s/p 7 day course of oral azithromycin and augmentin (day 7/7) -- Will need follow-up CXR in 4 weeks.   ** Asymptomatic ventricular tachycardia: Overnight on 4/1 with asymptomatic non-sustained ventricular tachycardia; however, heart rate at the time was 82; irregular rhythm with v-tach did have rate up to 100. Overnight on 4/2 had additional episodes of PVCs, as well as reported non-sustained V-tach lasting  and v-tach on the tele. Heart failure consulted and assessed as likely artifactual, recommended increasing carvedilol, and following electrolytes.  -- Heart failure c/s; appreciate reccs -- Cardiology follow-up scheduled for 4/18 @ 10:40 AM  ** HIV Infection: Followed with Dr. Ninetta Lights with ID and on truvada and tivicay with uncertain medication compliance. Last CD4 of 270 and viral load 10,387 in August of 2015. CD4 count this  admission is 470 and viral load 60, suggesting improvement with initiation of HAART since last admission.  -- Continue home antiretrovirals -- F/u scheduled with Dr. Ninetta Lights on 4/12  ** Passive suicidality: Endorses some thoughts of self-harm, but does not appear to have an active plan. -- Psychiatry consult; appreciate reccs -- Psychiatric social work provided resources for walk-in clinics for psychiatric follow-up and counseling  -- Continue lexapro 10 mg daily  ** HFrEF, HTN: History of malignant HTN and non-ischemic cardiomyopathy felt to be most likely due to HTN and HIV. Clinically euvolemic on exam, though BNP was elevated on admission. Improved control with home hypertensive medications, and increase in losartan to BID dosing.  -- Continue home heart failure and HTN meds including carvedilol, furosemide, hydralazine, imdur, and losartan.  ** Diabetes, type 2: Last A1c was August '15 at 9.7. With metformin at home, A1c during this admission is now 8.6.  -- Will d/c with home metformin and defer any changes to his PCP   ** FEN/GI:  -- Modified carb diet   ** PPx:  -- Subcu lovenox   ** Dispo:  -- Full code -- F/u with PCP Dr. Orpah Cobb on 4/8 -- F/u with Dr. Ninetta Lights w/ ID on 4/12 -- F/u with Dr. Gala Romney with Cardiology on 07/27/14 @ 10:40 AM  This is a Psychologist, occupational Note.  The care of the patient was discussed with Dr. Delane Ginger and the assessment and plan formulated with their assistance.  Please see their attached note for official documentation of the daily encounter.   LOS: 8 days  Shaun Brennan, Med Student 07/15/2014, 7:40 AM  [phone]

## 2014-07-15 NOTE — Consult Note (Signed)
Psychiatry Consult follow-up  Reason for Consult:  Depression, suicidal thoughts Referring Physician:  Dr. Dareen Piano Patient Identification: Shaun Brennan MRN:  [id number] Principal Diagnosis: Major depressive disorder single episode moderate Diagnosis:   Patient Active Problem List   Diagnosis Date Noted  . Suicidal ideation [R45.851] 07/14/2014  . Major depression [F32.2] 07/14/2014  . Depression, major, single episode, moderate [F32.1] 07/11/2014  . Community acquired pneumonia [J18.9] 07/07/2014  . CAP (community acquired pneumonia) [J18.9] 07/07/2014  . Chronic systolic heart failure [X38.18] 12/17/2013  . Diabetes mellitus due to underlying condition without complications [E99.3] 71/69/6789  . Smoking [Z72.0] 12/12/2013  . Acute on chronic systolic heart failure [F81.01] 12/09/2013  . Acute respiratory distress [J80] 12/06/2013  . 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 DM2 (diabetes mellitus, type 2) [E11.9] 12/06/2013  . HIV (human immunodeficiency virus infection) [Z21] 12/06/2013  . H/O CHF [Z86.79] 12/06/2013  . Cardiomegaly: per cxr 12/06/13 [I51.7] 12/06/2013    Total Time spent with patient: 30 minutes  Subjective:   Shaun Brennan is a 39 y.o. male patient admitted with cough and shortness of breath.  HPI:  Patient with history of Hiv, Hypertension and diabetes, CHF who was admitted for shortness of breath and cough. Patient denies prior history of mental illness but reports suicidal thoughts during this admission.  Patient had talked to the unit social worker about recurrent thoughts of suicide. Patient reports that he has been feeling depressed for months but never told anyone. During the interview,  he became tearful when speaking about his life, stating that he just doesn't want to live like this anymore; "I want to live normal life like everyone else, get marries, have children and be able to do what normal people do.'' He is reporting recurrent suicidal thoughts  but has no plan, "says I have no balls to kill myself.'' Patient states that he is homeless, helpless, hopeless, has low energy level and lack of motivation. He states that he has been stressed out and frustrated due to financial issues. He states that he had applied for disability but does not feel it will be approved. Patient denies psychosis or delusional thinking and agreed to be started on an antidepressant. He denies drugs use but reports occasional use of alcohol and Marijuana.   07/15/2014  Interval history: Patient was seen for psychiatric consultation follow-up today. Patient has no complaints today. Patient denies current suicidal, homicidal ideation, intention or plans. He has been repeatedly informed to psychiatrist and non-psychiatric providers that he is not suicidal and he was annoyed by having the suicidal watch. Patient endorses symptoms of depression secondary to multiple psychosocial stressors including health problems and lack of job and finances. Patient has been compliant with his medication and has no reported side effect and tolerating well. Patient also reported has a limited family and friends support. Patient reported he was relocated from [place] to [facility] about a year ago for change of seen. Patient is willing to receive outpatient psychiatric services and also counseling services when medically stable.   Patient does not meet criteria for acute psychiatric hospitalization as he does not have acute suicidal ideations, homicidal ideation, intention or plans.    Past Medical History:  Past Medical History  Diagnosis Date  . HIV (human immunodeficiency virus infection)   . Diabetes mellitus without complication   . Hypertension   . Chronic systolic heart failure     a. EF 15-20%, grade II DD, LA mild/mod dilated  . DM2 (diabetes mellitus, type 2)  Past Surgical History  Procedure Laterality Date  . Ankle surgery Right    Family History:  Family  History  Problem Relation Age of Onset  . Other Father   . Diabetes Mother   . Stroke Paternal Grandfather    Social History:  History  Alcohol Use  . Yes    Comment: socially about 6 pack per week      History  Drug Use  . Yes  . Special: Marijuana    History   Social History  . Marital Status: Single    Spouse Name: N/A  . Number of Children: N/A  . Years of Education: N/A   Social History Main Topics  . Smoking status: Current Every Day Smoker -- 0.25 packs/day    Types: Cigarettes  . Smokeless tobacco: Never Used     Comment: about 10 cigarettes a week  . Alcohol Use: Yes     Comment: socially about 6 pack per week   . Drug Use: Yes    Special: Marijuana  . Sexual Activity: Yes     Comment: pt. given condoms   Other Topics Concern  . None   Social History Narrative   Currently homeless   Additional Social History:                          Allergies:   Allergies  Allergen Reactions  . Lisinopril Cough    Labs:  Results for orders placed or performed during the hospital encounter of 07/07/14 (from the past 48 hour(s))  Glucose, capillary     Status: Abnormal   Collection Time: 07/13/14 12:06 PM  Result Value Ref Range   Glucose-Capillary 183 (H) 70 - 99 mg/dL  Magnesium     Status: None   Collection Time: 07/13/14  2:27 PM  Result Value Ref Range   Magnesium 1.9 1.5 - 2.5 mg/dL  Basic metabolic panel Once     Status: Abnormal   Collection Time: 07/13/14  2:27 PM  Result Value Ref Range   Sodium 131 (L) 135 - 145 mmol/L   Potassium 5.2 (H) 3.5 - 5.1 mmol/L   Chloride 99 96 - 112 mmol/L   CO2 18 (L) 19 - 32 mmol/L   Glucose, Bld 293 (H) 70 - 99 mg/dL   BUN 20 6 - 23 mg/dL   Creatinine, Ser 1.03 0.50 - 1.35 mg/dL   Calcium 9.3 8.4 - 10.5 mg/dL   GFR calc non Af Amer >90 >90 mL/min   GFR calc Af Amer >90 >90 mL/min    Comment: (NOTE) The eGFR has been calculated using the CKD EPI equation. This calculation has not been validated in  all clinical situations. eGFR's persistently <90 mL/min signify possible Chronic Kidney Disease.    Anion gap 14 5 - 15  Glucose, capillary     Status: Abnormal   Collection Time: 07/13/14  5:23 PM  Result Value Ref Range   Glucose-Capillary 257 (H) 70 - 99 mg/dL  Glucose, capillary     Status: Abnormal   Collection Time: 07/13/14 10:43 PM  Result Value Ref Range   Glucose-Capillary 224 (H) 70 - 99 mg/dL  Glucose, capillary     Status: Abnormal   Collection Time: 07/14/14  7:43 AM  Result Value Ref Range   Glucose-Capillary 114 (H) 70 - 99 mg/dL  Basic metabolic panel Once     Status: Abnormal   Collection Time: 07/14/14  7:55 AM  Result Value  Ref Range   Sodium 134 (L) 135 - 145 mmol/L   Potassium 4.1 3.5 - 5.1 mmol/L   Chloride 99 96 - 112 mmol/L   CO2 31 19 - 32 mmol/L   Glucose, Bld 102 (H) 70 - 99 mg/dL   BUN 19 6 - 23 mg/dL   Creatinine, Ser 0.94 0.50 - 1.35 mg/dL   Calcium 9.0 8.4 - 10.5 mg/dL   GFR calc non Af Amer >90 >90 mL/min   GFR calc Af Amer >90 >90 mL/min    Comment: (NOTE) The eGFR has been calculated using the CKD EPI equation. This calculation has not been validated in all clinical situations. eGFR's persistently <90 mL/min signify possible Chronic Kidney Disease.    Anion gap 4 (L) 5 - 15  Magnesium     Status: None   Collection Time: 07/14/14  7:55 AM  Result Value Ref Range   Magnesium 1.8 1.5 - 2.5 mg/dL  Glucose, capillary     Status: Abnormal   Collection Time: 07/14/14 12:14 PM  Result Value Ref Range   Glucose-Capillary 248 (H) 70 - 99 mg/dL  Glucose, capillary     Status: Abnormal   Collection Time: 07/14/14  4:18 PM  Result Value Ref Range   Glucose-Capillary 243 (H) 70 - 99 mg/dL  Glucose, capillary     Status: Abnormal   Collection Time: 07/14/14 10:20 PM  Result Value Ref Range   Glucose-Capillary 252 (H) 70 - 99 mg/dL  Glucose, capillary     Status: Abnormal   Collection Time: 07/15/14  8:30 AM  Result Value Ref Range    Glucose-Capillary 106 (H) 70 - 99 mg/dL    Vitals: Blood pressure 125/72, pulse 84, temperature 98.4 F (36.9 C), temperature source Oral, resp. rate 16, height _0  (1.651 m), weight 82.5 kg (181 lb 14.1 oz), SpO2 95 %.  Risk to Self: Is patient at risk for suicide?: No Risk to Others:   Prior Inpatient Therapy:   Prior Outpatient Therapy:    Current Facility-Administered Medications  Medication Dose Route Frequency Provider Last Rate Last Dose  . acetaminophen (TYLENOL) tablet 650 mg  650 mg Oral Q6H PRN Alexa Sherral Hammers, MD   650 mg at 07/09/14 2315  . carvedilol (COREG) tablet 18.75 mg  18.75 mg Oral BID WC [name] D Clegg, NP   18.75 mg at 07/15/14 0648  . chlorpheniramine-HYDROcodone (TUSSIONEX) 10-8 MG/5ML suspension 5 mL  5 mL Oral Q12H PRN Alexa Sherral Hammers, MD   5 mL at 07/09/14 1717  . dolutegravir (TIVICAY) tablet 50 mg  50 mg Oral Daily Jessee Avers, MD   50 mg at 07/15/14 0958  . emtricitabine-tenofovir (TRUVADA) 200-300 MG per tablet 1 tablet  1 tablet Oral Daily Jessee Avers, MD   1 tablet at 07/15/14 [phone]  . enoxaparin (LOVENOX) injection 40 mg  40 mg Subcutaneous Q24H Jessee Avers, MD   40 mg at 07/14/14 2025  . escitalopram (LEXAPRO) tablet 10 mg  10 mg Oral QHS Mojeed Akintayo   10 mg at 07/14/14 2217  . furosemide (LASIX) tablet 40 mg  40 mg Oral Once per day on Mon Thu [name] D Clegg, NP   40 mg at 07/13/14 1401  . guaiFENesin tablet 200 mg  200 mg Oral Q4H PRN Alexa Sherral Hammers, MD   200 mg at 07/09/14 2319  . hydrALAZINE (APRESOLINE) tablet 50 mg  50 mg Oral 3 times per day Jessee Avers, MD   50 mg at 07/15/14 0600  .  ibuprofen (ADVIL,MOTRIN) tablet 600 mg  600 mg Oral Q6H PRN Alexa Sherral Hammers, MD      . insulin aspart (novoLOG) injection 0-15 Units  0-15 Units Subcutaneous TID WC Jones Bales, MD   5 Units at 07/14/14 1704  . insulin detemir (LEVEMIR) injection 18 Units  18 Units Subcutaneous QHS Alexa Sherral Hammers, MD   18 Units at 07/14/14 2223  .  ipratropium-albuterol (DUONEB) 0.5-2.5 (3) MG/3ML nebulizer solution 3 mL  3 mL Nebulization Q6H PRN Nischal Narendra, MD      . isosorbide mononitrate (IMDUR) 24 hr tablet 30 mg  30 mg Oral Daily Jessee Avers, MD   30 mg at 07/15/14 0958  . losartan (COZAAR) tablet 50 mg  50 mg Oral BID Wilber Oliphant, MD   50 mg at 07/15/14 0957    Musculoskeletal: Strength & Muscle Tone: within normal limits Gait & Station: normal Patient leans: N/A  Psychiatric Specialty Exam: Physical Exam  Psychiatric: His speech is normal. His mood appears anxious. He is slowed and withdrawn. Cognition and memory are normal. He expresses impulsivity. He exhibits a depressed mood. He expresses suicidal ideation.    Review of Systems  Constitutional: Positive for malaise/fatigue.  HENT: Negative.   Eyes: Negative.   Respiratory: Positive for cough and shortness of breath.   Cardiovascular: Negative.   Gastrointestinal: Negative.   Genitourinary: Negative.   Musculoskeletal: Negative.   Skin: Negative.   Neurological: Negative.   Endo/Heme/Allergies: Negative.   Psychiatric/Behavioral: Positive for depression, suicidal ideas and substance abuse. The patient is nervous/anxious.     Blood pressure 125/72, pulse 84, temperature 98.4 F (36.9 C), temperature source Oral, resp. rate 16, height _0  (1.651 m), weight 82.5 kg (181 lb 14.1 oz), SpO2 95 %.Body mass index is 30.27 kg/(m^2).  General Appearance: Casual and dressed in hospital gown  Eye Contact::  Good  Speech:  Clear and Coherent  Volume:  Decreased  Mood:  Depressed and Hopeless  Affect:  Tearful  Thought Process:  Goal Directed  Orientation:  Full (Time, Place, and Person)  Thought Content:  Negative  Suicidal Thoughts:  No  Homicidal Thoughts:  No  Memory:  Immediate;   Fair Recent;   Fair Remote;   Good  Judgement:  Poor  Insight:  Shallow  Psychomotor Activity:  Psychomotor Retardation  Concentration:  Fair  Recall:  Good  Fund of  Knowledge:Good  Language: Good  Akathisia:  No  Handed:  Right  AIMS (if indicated):     Assets:  Communication Skills Desire for Improvement Physical Health  ADL's:  Intact  Cognition: WNL  Sleep:   poor   Medical Decision Making: Review or order clinical lab tests (1), Established Problem, Worsening (2) and Review of Medication Regimen & Side Effects (2)  Treatment Plan Summary: Daily contact with patient to assess and evaluate symptoms and progress in treatment. Medication management: Lexapro 2m Qhs for depression.  Plan: Discontinue safety sitter Patient has capacity to make his own medical decisions and living arrangements Continue Lexapro 10 mg daily for depression as prescribed No evidence of imminent risk to self or others at present.   Patient does not meet criteria for psychiatric inpatient admission. Supportive therapy provided about ongoing stressors.   Dis[facility]: [facility] Please contact psychiatric social service if needed further assistance.  JDurward Parcel, MD 07/15/2014 10:28 AM

## 2014-07-15 NOTE — ED Notes (Signed)
Pt sts was just discharged from upstairs. sts went to the bus stop and got really weak and diaphoretic. CBG was 68 at triage. Pt given OJ.

## 2014-07-15 NOTE — Progress Notes (Signed)
Pt education and discharge complete. Pt stable, no complaints or concerns stated

## 2014-07-15 NOTE — ED Notes (Signed)
Pt tolerating fluids.   

## 2014-07-16 LAB — CBG MONITORING, ED: Glucose-Capillary: 68 mg/dL — ABNORMAL LOW (ref 70–99)

## 2014-07-16 NOTE — Progress Notes (Signed)
I have seen the patient and reviewed the daily progress note by the medical student and discussed the care of the patient with them.  See my progress note for documentation of my findings, assessment, and plans.  Boykin Peek, MD PGY-2, IMTS    MEDICAL STUDENT NOTE:  Subjective: NAEON. Social work spoke with patient and became concerned about possible suicidality. Psychiatry consult was called. This AM, the patient became tearful when speaking about his life, stating that he just doesn't want to live like this anymore. When asked if he had thoughts of hurting himself, states, "I do and I don't." He thinks he won't act on these thoughts. He states that he has felt like this before and when he had thoughts of ending his life, he presented to the hospital and told them about these thoughts. He says after speaking with people over the telephone, he felt better after a few days with no medications or formal psychotherapy. He thinks he may benefit from antidepressants now. He thinks he would be able to let someone know if he had thoughts of ending his life again. He would like to speak with social work again today.   He declines resources for tobacco sensation. States he is only a social smoker and doesn't think it/quitting is an issue for him.   Afebrile overnight, with O2 sats of 94-97% on room air. BP 160/92 this AM. He says his cough has improved from admission, but still has coughing spells that leave him short of breath. Otherwise is not short of breath.   Objective: Vital signs in last 24 hours: Filed Vitals:   07/14/14 2122 07/15/14 0516 07/15/14 0720 07/15/14 0900  BP: 134/80 123/82  125/72  Pulse: 86 78  84  Temp: 98.3 F (36.8 C) 98.1 F (36.7 C)  98.4 F (36.9 C)  TempSrc: Oral Oral  Oral  Resp: 18 18  16   Height:      Weight:   82.5 kg (181 lb 14.1 oz)   SpO2: 100% 99%  95%   Weight change: -1.824 kg (-4 lb 0.3 oz) No intake or output data in the 24 hours ending 07/16/14  2205 General: Lying in bed sleeping, awakens easily.  Pulm: Improved with clear lung fields bilaterally.  CV: Regular rate and rhythm, no murmurs.  Abdominal: Normal active bowel sounds. Soft, non-tender.  Extremities: No lower extremity edema.  Psych: Intermittently tearful. Makes good eye contact, converses appropriately with interviewer, thought content appears linear.   Lab Results: CBG 195 this AM.   Medications:  Scheduled Meds:  Continuous Infusions: PRN Meds:.   Assessment/Plan: Mr. Meneley is a 39 yo male with a PMH significant for HIV, DM, HTN, and HFrEF who is admitted for further evaluation of SOB, hemoptysis, cough, hypoxia, and CXR with LLL consolidation c/w CAP.   Principal Problem:   Suicidal ideation Active Problems:   DM2 (diabetes mellitus, type 2)   Chronic systolic heart failure   Community acquired pneumonia   CAP (community acquired pneumonia)   Depression, major, single episode, moderate   Major depression  ** CAP: Initially presented with SOB, cough, hemoptysis, and pleuritic chest pain. Precipitating factors likely include tobacco use and immunosuppression 2/2 HIV with current CD4 count of 470. Now s/p 3 days of IV ceftriaxone and oral azithromycin with significant clinical improvement in fevers and initial hypoxia. Previously treated with IV ceftriaxone and azithromycin.  -- Now on oral azithromycin and augmentin (day 4/7) -- Follow up blood cultures, pending sputum culture, and  respiratory viral panel. -- Streptococcus pneumoniae urinary antigen negative -- Sputum DFA negative, influenza panel negative, legionella urinary antigen negative -- Respiratory culture with abundant WBCs with PMN predominance, few gram positive cocci in pairs, chains, and clusters, and rare gram positive rods; most likely mixed oropharyngeal flora  -- Continue symptomatic care with guaifenesin and tussionex (chlorpheniramine-hydrocodone), tylenol and ibuprofen, and duonebs --  Will titrate supplemental oxygen to maintain O2 >92%; currently on room air.   ** Asymptomatic ventricular tachycardia: Overnight on 4/1 with asymptomatic non-sustained ventricular tachycardia; however, heart rate at the time was 82; irregular rhythm with v-tach did have rate up to 100. Overnight on 4/2 had additional episodes of PVCs, as well as reported non-sustained V-tach lasting and v-tach on the tele. Unclear of value of possible ICD in the setting of probable NICM.  -- Will need follow-up with Dr. Gala Romney.  ** HIV Infection: Followed with Dr. Ninetta Lights with ID and on truvada and tivicay with uncertain medication compliance. Last CD4 of 270 and viral load 10,387 in August of 2015. CD4 count this admission is 470 and viral load 60, suggesting improvement with initiation of HAART since last admission.  -- Continue home antiretrovirals -- F/u scheduled with Dr. Ninetta Lights on 4/12  ** Passive suicidality: Endorses some thoughts of self-harm, but does not appear to have an active plan. Psychiatry involvement and anti-depressants would likely be helpful. Will need to consider drug drug interactions between SSRIs and HIV medications.  -- Psychiatry consult today -- Chaplain consult today  ** HFrEF, HTN: History of malignant HTN and non-ischemic cardiomyopathy felt to be most likely due to HTN and HIV. Clinically euvolemic on exam, though BNP was elevated on admission. Improved control with home hypertensive medications, and increase in losartan to BID dosing.  -- Continue home heart failure and HTN meds including carvedilol, furosemide, hydralazine, imdur, and losartan.  ** Diabetes, type 2: Last A1c was August '15 at 9.7. With metformin at home, A1c during this admission is now 8.6.  -- Insulin determir 18 units QHS -- SSI  ** FEN/GI:  -- Modified carb diet   ** PPx:  -- Subcu lovenox   ** Dispo:  -- Full code -- Social work consult -- F/u with PCP Dr. Orpah Cobb on 4/8 -- F/u with Dr.  Ninetta Lights w/ ID on 4/12 -- Needs cardiology f/u  This is a Medical Student Note.  The care of the patient was discussed with Dr. Delane Ginger and the assessment and plan formulated with their assistance.  Please see their attached note for official documentation of the daily encounter.

## 2014-07-16 NOTE — Progress Notes (Signed)
I have seen the patient and reviewed the daily progress note by the medical student and discussed the care of the patient with them.  See my progress note for documentation of my findings, assessment, and plans.  Shaun Peek, MD IMTS, PGY-2   MEDICAL STUDENT NOTE:  Subjective: Overnight, telemetry showed 8 beats of ventricular tachycardia, lasting ~5.2 seconds at a heart rate of 82 at the time. Per nursing, he was asymptomatic at the time.   Otherwise, the patient has been afebrile, with normal vitals aside from one reading to tachycardia to 102 and tachypnea to 25. He has been satting well on room air.   The patient reports that his SOB is roughly the same, and continues to complain of cough with associated pleuritic chest pain. He is concerned about making sure that he sees social work before discharge.   Objective: Vital signs in last 24 hours: Filed Vitals:   07/14/14 2122 07/15/14 0516 07/15/14 0720 07/15/14 0900  BP: 134/80 123/82  125/72  Pulse: 86 78  84  Temp: 98.3 F (36.8 C) 98.1 F (36.7 C)  98.4 F (36.9 C)  TempSrc: Oral Oral  Oral  Resp: Height:      Weight:   82.5 kg (181 lb 14.1 oz)   SpO2: 100% 99%  95%   Weight change: -1.824 kg (-4 lb 0.3 oz) No intake or output data in the 24 hours ending 07/16/14 2208 BP 125/72 mmHg  Pulse 84  Temp(Src) 98.4 F (36.9 C) (Oral)  Resp 16  Ht  (1.651 m)  Wt 82.5 kg (181 lb 14.1 oz)  BMI 30.27 kg/m2  SpO2 95% General appearance: Alert, lying in bed in no acute distress.  Lungs: Continued diminished breath sounds over left lung fields. No wheezing or crackles heard.  Heart: Regular rate and rhythm, no murmurs.    Lab Results: CBC    Component Value Date/Time   WBC 7.2 07/10/2014 0600   RBC 4.02* 07/10/2014 0600   HGB 13.2 07/10/2014 0600   HCT 39.2 07/10/2014 0600   PLT 133* 07/10/2014 0600   MCV 97.5 07/10/2014 0600   MCH 32.8 07/10/2014 0600   MCHC 33.7 07/10/2014 0600   RDW 13.7  07/10/2014 0600   LYMPHSABS 1.3 07/08/2014 1114   MONOABS 0.8 07/08/2014 1114   EOSABS 0.1 07/08/2014 1114   BASOSABS 0.0 07/08/2014 1114   BMET    Component Value Date/Time   NA 134* 07/14/2014 0755   K 4.1 07/14/2014 0755   CL 99 07/14/2014 0755   CO2 31 07/14/2014 0755   GLUCOSE 102* 07/14/2014 0755   BUN 19 07/14/2014 0755   CREATININE 0.94 07/14/2014 0755   CALCIUM 9.0 07/14/2014 0755   GFRNONAA >90 07/14/2014 0755   GFRAA >90 07/14/2014 0755   Micro Results:  Reviewed in EPIC. Please see chart for details.   Medications:  Scheduled Meds:  Continuous Infusions: PRN Meds:.   Assessment/Plan: Shaun Brennan is a 39 yo male with a PMH significant for HIV, DM, HTN, and HFrEF who is admitted for further evaluation of SOB, hemoptysis, cough, hypoxia, and CXR with LLL consolidation c/w CAP.   Principal Problem:   Suicidal ideation Active Problems:   DM2 (diabetes mellitus, type 2)   Chronic systolic heart failure   Community acquired pneumonia   CAP (community acquired pneumonia)   Depression, major, single episode, moderate   Major depression  ** CAP: Initially presented with SOB, cough, hemoptysis, and pleuritic chest pain.  Precipitating factors likely include tobacco use and immunosuppression 2/2 HIV with current CD4 count of 470. Now s/p 3 days of IV ceftriaxone and oral azithromycin with significant clinical improvement in fevers and initial hypoxia.  -- Will transition from IV ceftriaxone to oral augmentin today. Will continue azithromycin.  -- Follow up blood cultures, pending legionella urinary antigen, and respiratory viral panel. -- Streptococcus pneumoniae urinary antigen negative -- Sputum DFA negative -- Respiratory culture with abundant WBCs with PMN predominance, few gram positive cocci in pairs, chains, and clusters, and rare gram positive rods; most likely mixed oropharyngeal flora  -- Influenza panel negative -- Continue symptomatic care with guaifenesin  and tussionex (chlorpheniramine-hydrocodone), tylenol and ibuprofen, and duonebs -- Will titrate supplemental oxygen to maintain O2 >92%; currently on room air.   ** Asymptomatic ventricular tachycardia: Appears to be asymptomatic non-sustained ventricular tachycardia; however, heart rate at the time was 82; irregular rhythm with v-tach did have rate up to 100. Unclear of value of possible ICD in the setting of probable NICM.  -- Will touch base with Dr. Gala Romney today   ** HIV Infection: Followed with Dr. Ninetta Lights with ID and on truvada and tivicay with uncertain medication compliance. Last CD4 of 270 and viral load 10,387 in August of 2015. CD4 count this admission is 470, suggesting improved with initiation of HAART since last admission.  -- Continue home antiretrovirals -- F/u scheduled with Dr. Ninetta Lights on 4/12  ** HFrEF, HTN: History of malignant HTN and non-ischemic cardiomyopathy felt to be most likely due to HTN and HIV. Clinically euvolemic on exam, though BNP was elevated on admission. Improved control with home hypertensive medications, and increase in losartan to BID dosing.  -- Continue home heart failure and HTN meds including carvedilol, furosemide, hydralazine, imdur, and losartan. -- Increased losartan to 50 mg BID 3/30 as per heart failure clinic note.   ** Diabetes, type 2: Last A1c was August '15 at 9.7. With metformin at home, A1c during this admission is now 8.6.  -- Insulin determir to 18 units QHS -- SSI  ** FEN/GI:  -- Modified carb diet   ** PPx:  -- Subcu lovenox   ** Dispo:  -- Full code -- Transitioning from IV to oral antibiotics today, anticipate discharge in 1 day -- Social work consult -- F/u with PCP Dr. Orpah Cobb on 4/8  This is a Psychologist, occupational Note.  The care of the patient was discussed with Dr. Delane Ginger and the assessment and plan formulated with their assistance.  Please see their attached note for official documentation of the daily encounter.    LOS: 8 days

## 2014-07-17 ENCOUNTER — Inpatient Hospital Stay: Payer: Self-pay | Admitting: Internal Medicine

## 2014-07-21 ENCOUNTER — Inpatient Hospital Stay: Payer: Self-pay | Admitting: Infectious Diseases

## 2014-07-25 ENCOUNTER — Encounter (HOSPITAL_COMMUNITY): Payer: Self-pay

## 2014-07-25 ENCOUNTER — Emergency Department (HOSPITAL_COMMUNITY): Payer: Self-pay

## 2014-07-25 ENCOUNTER — Emergency Department (HOSPITAL_COMMUNITY)
Admission: EM | Admit: 2014-07-25 | Discharge: 2014-07-25 | Disposition: A | Discharge status: Home / Self Care | Payer: Self-pay | Attending: Emergency Medicine | Admitting: Emergency Medicine

## 2014-07-25 DIAGNOSIS — M79671 Pain in right foot: Secondary | ICD-10-CM | POA: Insufficient documentation

## 2014-07-25 DIAGNOSIS — E119 Type 2 diabetes mellitus without complications: Secondary | ICD-10-CM | POA: Insufficient documentation

## 2014-07-25 DIAGNOSIS — Z720 Tobacco use: Secondary | ICD-10-CM | POA: Insufficient documentation

## 2014-07-25 DIAGNOSIS — Z79899 Other long term (current) drug therapy: Secondary | ICD-10-CM | POA: Insufficient documentation

## 2014-07-25 DIAGNOSIS — M25571 Pain in right ankle and joints of right foot: Secondary | ICD-10-CM | POA: Insufficient documentation

## 2014-07-25 DIAGNOSIS — Z21 Asymptomatic human immunodeficiency virus [HIV] infection status: Secondary | ICD-10-CM | POA: Insufficient documentation

## 2014-07-25 DIAGNOSIS — I10 Essential (primary) hypertension: Secondary | ICD-10-CM | POA: Insufficient documentation

## 2014-07-25 DIAGNOSIS — I5022 Chronic systolic (congestive) heart failure: Secondary | ICD-10-CM | POA: Insufficient documentation

## 2014-07-25 LAB — CBG MONITORING, ED: Glucose-Capillary: 176 mg/dL — ABNORMAL HIGH (ref 70–99)

## 2014-07-25 MED ORDER — TRAMADOL HCL 50 MG PO TABS: 50.0000 mg | ORAL_TABLET | Freq: Four times a day (QID) | ORAL | Status: DC | PRN

## 2014-07-25 MED ORDER — HYDROCODONE-ACETAMINOPHEN 5-325 MG PO TABS
1.0000 | ORAL_TABLET | Freq: Once | ORAL | Status: AC
  Administered 2014-07-25: 1 via ORAL
  Filled 2014-07-25: qty 1

## 2014-07-25 NOTE — ED Provider Notes (Signed)
CSN: [id number]     Arrival date & time 07/25/14  1030 History   First MD Initiated Contact with Patient 07/25/14 1056     Chief Complaint  Patient presents with  . Foot Pain     (Consider location/radiation/quality/duration/timing/severity/associated sxs/prior Treatment) HPI Comments: Patient with h/o HIV (last CD4 count 470 2 weeks ago), congestive heart failure, hypertension, homelessness -- presents with complaint of worsening right foot pain along the sole of his foot. Patient states that he walks a lot during the day. Normally he walks with a cane however he lost this several days ago. Pain is been worse over the past 3 days. Patient has baseline swelling of his right foot and ankle due to previous surgery. Patient states that this is unchanged. He has not noted any fever, redness of the skin around the ankle or foot. No treatments prior to arrival. Patient currently denies any other medical complaints.    Patient is a 39 y.o. male presenting with lower extremity pain. The history is provided by the patient and medical records.  Foot Pain Associated symptoms include arthralgias and joint swelling. Pertinent negatives include no abdominal pain, chest pain, coughing, fever, headaches, myalgias, nausea, rash, sore throat or vomiting.    Past Medical History  Diagnosis Date  . HIV (human immunodeficiency virus infection)   . Diabetes mellitus without complication   . Hypertension   . Chronic systolic heart failure     a. EF 15-20%, grade II DD, LA mild/mod dilated  . DM2 (diabetes mellitus, type 2)    Past Surgical History  Procedure Laterality Date  . Ankle surgery Right    Family History  Problem Relation Age of Onset  . Other Father   . Diabetes Mother   . Stroke Paternal Grandfather    History  Substance Use Topics  . Smoking status: Current Every Day Smoker -- 0.25 packs/day    Types: Cigarettes  . Smokeless tobacco: Never Used     Comment: about 10 cigarettes a week   . Alcohol Use: Yes     Comment: socially about 6 pack per week     Review of Systems  Constitutional: Negative for fever.  HENT: Negative for rhinorrhea and sore throat.   Eyes: Negative for redness.  Respiratory: Negative for cough.   Cardiovascular: Negative for chest pain.  Gastrointestinal: Negative for nausea, vomiting, abdominal pain and diarrhea.  Genitourinary: Negative for dysuria.  Musculoskeletal: Positive for joint swelling and arthralgias. Negative for myalgias.  Skin: Negative for rash.  Neurological: Negative for headaches.    Allergies  Lisinopril  Home Medications   Prior to Admission medications   Medication Sig Start Date End Date Taking? Authorizing Provider  carvedilol (COREG) 6.25 MG tablet Take 3 tablets (18.75 mg total) by mouth 2 (two) times daily with a meal. 07/15/14   Jones Bales, MD  dolutegravir (TIVICAY) 50 MG tablet Take 1 tablet (50 mg total) by mouth daily. 02/04/14   Thayer Headings, MD  emtricitabine-tenofovir (TRUVADA) 200-300 MG per tablet Take 1 tablet by mouth daily. 02/04/14   Thayer Headings, MD  escitalopram (LEXAPRO) 10 MG tablet Take 1 tablet (10 mg total) by mouth at bedtime. 07/15/14   Jones Bales, MD  furosemide (LASIX) 40 MG tablet Take only on Mondays and Fridays 03/25/14   Rande Brunt, NP  glucose monitoring kit (FREESTYLE) monitoring kit 1 each by Does not apply route 4 (four) times daily - after meals and at bedtime. 1 month  Diabetic Testing Supplies for QAC-QHS accuchecks. 12/12/13   Doris Cheadle, MD  hydrALAZINE (APRESOLINE) 50 MG tablet Take 1 tablet (50 mg total) by mouth every 8 (eight) hours. 03/25/14   [name], NP  isosorbide mononitrate (IMDUR) 30 MG 24 hr tablet Take 1 tablet (30 mg total) by mouth daily. 03/25/14   [name], NP  losartan (COZAAR) 50 MG tablet Take 1 tablet (50 mg total) by mouth daily. 04/27/14   Dolores Patty, MD  metFORMIN (GLUCOPHAGE) 500 MG tablet Take 1 tablet (500 mg total)  by mouth 2 (two) times daily with a meal. 03/25/14   Deepak Advani, MD   BP 175/100 mmHg  Pulse 97  Temp(Src) 98.2 F (36.8 C) (Oral)  Resp 15  SpO2 97%   Physical Exam  Constitutional: He appears well-developed and well-nourished.  HENT:  Head: Normocephalic and atraumatic.  Eyes: Conjunctivae are normal. Right eye exhibits no discharge. Left eye exhibits no discharge.  Neck: Normal range of motion. Neck supple.  Cardiovascular: Normal rate, regular rhythm and normal heart sounds.   Pulses:      Dorsalis pedis pulses are 2+ on the right side, and 2+ on the left side.       Posterior tibial pulses are 2+ on the right side, and 2+ on the left side.  Pulmonary/Chest: Effort normal and breath sounds normal.  Abdominal: Soft. There is no tenderness.  Musculoskeletal: He exhibits edema and tenderness.       Right knee: Normal.       Left knee: Normal.       Right ankle: He exhibits decreased range of motion. He exhibits no swelling. No tenderness.       Left ankle: Normal.       Right lower leg: He exhibits no tenderness, no bony tenderness and no swelling.       Left lower leg: He exhibits no tenderness, no bony tenderness and no swelling.       Right foot: There is decreased range of motion, tenderness and swelling (chronic). There is no bony tenderness and normal capillary refill.       Left foot: Normal.  Chronic postoperative changes noted of right ankle and foot. Patient states that the swelling is unchanged from normal. Patient is tender on the sole of the foot. There is no erythema or warmth to suggest cellulitis. There is no right calf swelling or tenderness to suggest DVT.  Neurological: He is alert.  Skin: Skin is warm and dry.  Psychiatric: He has a normal mood and affect.  Nursing note and vitals reviewed.   ED Course  Procedures (including critical care time) Labs Review Labs Reviewed  CBG MONITORING, ED - Abnormal; Notable for the following:    Glucose-Capillary  176 (*)    All other components within normal limits    Imaging Review Dg Foot Complete Right  07/25/2014   CLINICAL DATA:  Right foot pain for 1 week, no known injury, initial encounter  EXAM: RIGHT FOOT COMPLETE - 3+ VIEW  COMPARISON:  None.  FINDINGS: Postsurgical changes are noted in the distal tibia and fibula. Two of the screws traversing from the fibula into the tibia have fractured. This is of uncertain chronicity. Generalized soft tissue swelling is noted. Some degenerative changes are noted at the first MTP joint. No acute bony abnormality is noted.  IMPRESSION: Chronic changes in the distal tibia and fibula.  Hardware failure as described.  No acute abnormality is noted.  Electronically Signed   By: Inez Catalina M.D.   On: 07/25/2014 12:25     EKG Interpretation None      11:18 AM Patient seen and examined. Work-up initiated. Medications ordered.   Vital signs reviewed and are as follows: BP 175/100 mmHg  Pulse 97  Temp(Src) 98.2 F (36.8 C) (Oral)  Resp 15  SpO2 97%  12:54 PM Patient informed of x-ray results. Will provide with crutches. Discharge to home with tramadol for pain control. Also given orthopedist follow-up.  Patient counseled on use of narcotic pain medications. Counseled not to combine these medications with others containing tylenol. Urged not to drink alcohol, drive, or perform any other activities that requires focus while taking these medications. The patient verbalizes understanding and agrees with the plan.  Patient was counseled on RICE protocol and told to rest injury, use ice for no longer than 15 minutes every hour, compress the area, and elevate above the level of their heart as much as possible to reduce swelling. Questions answered. Patient verbalized understanding.     MDM   Final diagnoses:  Right foot pain   Patient with right foot pain. Patient has chronic swelling and deformity of the foot due to previous surgery. This is unchanged.  Patient's complaint today is pain. There is no sign of infection or cellulitis. X-ray shows 2 broken screws, however it is unclear if this is causing worsening pain. Feel patient would benefit from orthopedic follow-up, PCP follow-up.   No calf tenderness or swelling to suggest DVT.   Carlisle Cater, PA-C 07/25/14 [place], MD 07/26/14 [phone]

## 2014-07-25 NOTE — ED Notes (Signed)
Crutches were given to pt by Lawson Fiscal, RN; pt stated he is 5'5" but wants the crutches at 5'6"; readjusted crutches at pt's request; pt now upset because he wants a cane from the hospital but stated earlier that he is getting a cane tomorrow per Lawson Fiscal, [employer] fund[employer]; now pt is wanting his discharge paperwork, ice and gingerales; Lawson Fiscal, RN informed and Lequita Halt, RN is discharging pt

## 2014-07-25 NOTE — ED Notes (Signed)
Pt. Declined crutches due to difficulty using them.  Pt. Stated, "I will be getting a cane on Monday.".

## 2014-07-25 NOTE — ED Notes (Signed)
Rt. Ankle pain, began last night.  Pt. Had surgery on his rt. Ankle/foot in February 2015  In [place].  Pt. Is homeless and does a lot of walking.  Pt. Lost his cane a few days ago which has contributed to the pain.  Pt. Stated, "My foot has never been the same since surgery , I always have pain."    The ankle is swollen and deformed.  +CNS

## 2014-07-25 NOTE — ED Notes (Signed)
Pt undressed, in gown, on monitor, continuous pulse oximetry and blood pressure cuff 

## 2014-07-25 NOTE — ED Notes (Signed)
POCT glucose test resulted 176; Lori, RN present in room

## 2014-07-25 NOTE — ED Notes (Signed)
Bus pass given 

## 2014-07-25 NOTE — ED Notes (Signed)
Pt is out of the department for testing

## 2014-07-25 NOTE — Discharge Instructions (Signed)
Please read and follow all provided instructions.  Your diagnoses today include:  1. Right foot pain    Tests performed today include:  An x-ray of the affected area - does NOT show any broken bones, shows that you have two broken screws  Vital signs. See below for your results today.   Medications prescribed:   Tramadol - narcotic-like pain medication  DO NOT drive or perform any activities that require you to be awake and alert because this medicine can make you drowsy.   Take any prescribed medications only as directed.  Home care instructions:   Follow any educational materials contained in this packet  Follow R.I.C.E. Protocol:  R - rest your injury   I  - use ice on injury without applying directly to skin  C - compress injury with bandage or splint  E - elevate the injury as much as possible  Follow-up instructions: Please follow-up with your primary care provider or the provided orthopedic physician (bone specialist) if you continue to have significant pain in 1 week. In this case you may have a more severe injury that requires further care.   Return instructions:   Please return if your toes or feet are numb or tingling, appear gray or blue, or you have severe pain (also elevate the leg and loosen splint or wrap if you were given one)  Please return to the Emergency Department if you experience worsening symptoms.   Please return if you have any other emergent concerns.  Additional Information:  Your vital signs today were: BP 175/100 mmHg   Pulse 97   Temp(Src) 98.2 F (36.8 C) (Oral)   Resp 15   SpO2 97% If your blood pressure (BP) was elevated above 135/85 this visit, please have this repeated by your doctor within one month. -------------- If prescribed crutches for your injury: use crutches with non-weight bearing for the first few days. Then, you may walk as the pain allows, or as instructed. Start gradually with weight bearing on the affected side.  Once you can walk pain free, then try jogging. When you can run forwards, then you can try moving side-to-side. If you cannot walk without crutches in one week, you need a re-check. --------------

## 2014-07-25 NOTE — ED Notes (Signed)
Pt. Eating lunch, gingerale given and [place] sandwich bag.

## 2014-07-27 ENCOUNTER — Ambulatory Visit (HOSPITAL_COMMUNITY)
Admission: RE | Admit: 2014-07-27 | Discharge: 2014-07-27 | Disposition: A | Discharge status: Home / Self Care | Payer: Self-pay | Source: Ambulatory Visit | Attending: Cardiology | Admitting: Cardiology

## 2014-07-27 ENCOUNTER — Encounter (HOSPITAL_COMMUNITY): Payer: Self-pay

## 2014-07-27 VITALS — BP 136/90 | HR 98 | Wt 198.8 lb

## 2014-07-27 DIAGNOSIS — E119 Type 2 diabetes mellitus without complications: Secondary | ICD-10-CM | POA: Insufficient documentation

## 2014-07-27 DIAGNOSIS — Z21 Asymptomatic human immunodeficiency virus [HIV] infection status: Secondary | ICD-10-CM

## 2014-07-27 DIAGNOSIS — Z720 Tobacco use: Secondary | ICD-10-CM

## 2014-07-27 DIAGNOSIS — B20 Human immunodeficiency virus [HIV] disease: Secondary | ICD-10-CM

## 2014-07-27 DIAGNOSIS — F17210 Nicotine dependence, cigarettes, uncomplicated: Secondary | ICD-10-CM | POA: Insufficient documentation

## 2014-07-27 DIAGNOSIS — I10 Essential (primary) hypertension: Secondary | ICD-10-CM

## 2014-07-27 DIAGNOSIS — Z590 Homelessness: Secondary | ICD-10-CM | POA: Insufficient documentation

## 2014-07-27 DIAGNOSIS — Z79899 Other long term (current) drug therapy: Secondary | ICD-10-CM | POA: Insufficient documentation

## 2014-07-27 DIAGNOSIS — F17200 Nicotine dependence, unspecified, uncomplicated: Secondary | ICD-10-CM

## 2014-07-27 DIAGNOSIS — I5022 Chronic systolic (congestive) heart failure: Secondary | ICD-10-CM

## 2014-07-27 NOTE — Progress Notes (Signed)
Patient ID: Shaun Brennan, male   DOB: 01-Nov-1975, 39 y.o.   MRN: [id number]  PCP: Dr. Annitta Needs [facility]) Primary Cardiologist: Dr. Haroldine Laws  HPI: Mr. Mcclune is a 39 yo male with a history of HIV, DM2, HTN, tobacco abuse and chronic systolic heart failure.   Follow up for Heart Failure:  Recently hospitalized with ADHF because he was off meds while in jail. Today he is complaining of fatigue. Has not had HF meds in 2 weeks. Last visit switched to losartan 50 mg daily due to cough noted with ACE. Overall feeling pretty good. Denies SOB/PND/Orthopnea.  Drinking a 6 pack of beer on the weekend. Smoking 2-3 a day. Not weighing daily, currently homeless. Eating whatever he can get at shelters. Living in an abandoned house or the street.  Has bus passes to get to appointments.   Labs 03/31/2014 K 4.1 Creatinine 1.10  Negative Hep B surface antigen and negative HCV AB; RPR non-reactive.   ECHO (12/08/2013): EF 15-20%, grade II DD, mild/mod LA, RV normal ECHO 04/27/2014 EF 25-30% RV mildy HK. Not seen well.   ROS: All systems negative except as listed in HPI, PMH and Problem List.  SH:  History   Social History  . Marital Status: Single    Spouse Name: N/A  . Number of Children: N/A  . Years of Education: N/A   Occupational History  . Not on file.   Social History Main Topics  . Smoking status: Current Every Day Smoker -- 0.25 packs/day    Types: Cigarettes  . Smokeless tobacco: Never Used     Comment: about 10 cigarettes a week  . Alcohol Use: Yes     Comment: socially about 6 pack per week   . Drug Use: Yes    Special: Marijuana  . Sexual Activity: Yes     Comment: pt. given condoms   Other Topics Concern  . Not on file   Social History Narrative   Currently homeless    FH:  Family History  Problem Relation Age of Onset  . Other Father   . Diabetes Mother   . Stroke Paternal Grandfather     Past Medical History  Diagnosis Date  . HIV (human  immunodeficiency virus infection)   . Diabetes mellitus without complication   . Hypertension   . Chronic systolic heart failure     a. EF 15-20%, grade II DD, LA mild/mod dilated  . DM2 (diabetes mellitus, type 2)     Current Outpatient Prescriptions  Medication Sig Dispense Refill  . carvedilol (COREG) 6.25 MG tablet Take 3 tablets (18.75 mg total) by mouth 2 (two) times daily with a meal. 60 tablet 1  . dolutegravir (TIVICAY) 50 MG tablet Take 1 tablet (50 mg total) by mouth daily. 30 tablet 5  . emtricitabine-tenofovir (TRUVADA) 200-300 MG per tablet Take 1 tablet by mouth daily. 30 tablet 5  . escitalopram (LEXAPRO) 10 MG tablet Take 1 tablet (10 mg total) by mouth at bedtime. 30 tablet 1  . furosemide (LASIX) 40 MG tablet Take only on Mondays and Fridays 12 tablet 2  . glucose monitoring kit (FREESTYLE) monitoring kit 1 each by Does not apply route 4 (four) times daily - after meals and at bedtime. 1 month Diabetic Testing Supplies for QAC-QHS accuchecks. 1 each 1  . hydrALAZINE (APRESOLINE) 50 MG tablet Take 1 tablet (50 mg total) by mouth every 8 (eight) hours. 90 tablet 3  . isosorbide mononitrate (IMDUR) 30 MG 24  hr tablet Take 1 tablet (30 mg total) by mouth daily. 30 tablet 3  . losartan (COZAAR) 50 MG tablet Take 1 tablet (50 mg total) by mouth daily. 30 tablet 3  . metFORMIN (GLUCOPHAGE) 500 MG tablet Take 1 tablet (500 mg total) by mouth 2 (two) times daily with a meal. 60 tablet 0  . traMADol (ULTRAM) 50 MG tablet Take 1 tablet (50 mg total) by mouth every 6 (six) hours as needed. 10 tablet 0   No current facility-administered medications for this encounter.    Filed Vitals:   07/27/14 1059  BP: 136/90  Pulse: 98  Weight: 198 lb 12 oz (90.152 kg)  SpO2: 99%    PHYSICAL EXAM: General:  Fatigued appearing. No resp difficulty HEENT: normal Neck: supple. JVP 8-9 ; Carotids 2+ bilaterally; no bruits. No lymphadenopathy or thryomegaly appreciated. Cor: PMI normal.  Regular rate & rhythm. No rubs, gallops or murmurs. Lungs: clear Abdomen: soft, nontender, nondistended. No hepatosplenomegaly. No bruits or masses. Good bowel sounds. Extremities: no cyanosis, clubbing, rash, R and LLE 1+ edema Neuro: alert & orientedx3, cranial nerves grossly intact. Moves all 4 extremities w/o difficulty. Affect pleasant.   ASSESSMENT & PLAN:  1) Chronic systolic HF: NICM?, EF 80-32%  (1/18/20165).  - NYHA II symptoms. He presents today and has not been taking his medications for the last few weeks. Today we are restarting all HF meds.  Volume status mildly elevated. Will continue lasix 40 mg on Mondays and Fridays.  - Continue coreg 18.75 mg BID   Continue hydralazine 50 mg tid and Imdur 30 mg daily.  - No Ace due to cough.  -Continue losartan 50 mg daily. .   - Has no insurance therefore can not get ICD.  - Today we provided with pill box and arranged HF meds for one week.  Follow next week to get HF meds and to meet with paramedicine staff for pill box. Refer to paramedicine.  -2) HTN - Elevated but not taking medications.   3) HIV - Following with ID clinic. Currently out of meds. Has follow up with SW.  4) Social Issues - Currently homeless and living in an abandoned house. Has follow with social work.  5) Tobacco abuse: - Reports smoking about 4-5 cigarettes a week. Encouraged to try and abstain completely.  6) Alcohol abuse: - Discussed trying to abstain completely. Still drinking about a 6 pack on the weekends.    Follow up next week to get HF meds and have meds placed in pill box by staff RN.   Follow up in HF clinic in 1 month.   CLEGG,[name] NP-C 11:08 AM

## 2014-07-27 NOTE — Patient Instructions (Signed)
Your physician recommends that you schedule a follow-up appointment in: 1 week, nurse visit for pill box

## 2014-07-31 ENCOUNTER — Other Ambulatory Visit: Payer: Self-pay | Admitting: *Deleted

## 2014-07-31 DIAGNOSIS — B20 Human immunodeficiency virus [HIV] disease: Secondary | ICD-10-CM

## 2014-07-31 MED ORDER — DOLUTEGRAVIR SODIUM 50 MG PO TABS: 50.0000 mg | ORAL_TABLET | Freq: Every day | ORAL | Status: DC

## 2014-07-31 MED ORDER — EMTRICITABINE-TENOFOVIR DF 200-300 MG PO TABS: 1.0000 | ORAL_TABLET | Freq: Every day | ORAL | Status: DC

## 2014-08-03 ENCOUNTER — Encounter: Payer: Self-pay | Admitting: Licensed Clinical Social Worker

## 2014-08-03 ENCOUNTER — Ambulatory Visit (HOSPITAL_COMMUNITY)
Admission: RE | Admit: 2014-08-03 | Discharge: 2014-08-03 | Disposition: A | Discharge status: Home / Self Care | Payer: Self-pay | Source: Ambulatory Visit | Attending: Internal Medicine | Admitting: Internal Medicine

## 2014-08-03 NOTE — Progress Notes (Signed)
CSW referred to assist patient with community resources and support. Patient reports he is now staying at [facility] (homeless shelter) for 30 days. He reports "I am doing all the right things". He states that he is compliant with his heart failure medications but admits that he is in need for his HIV medications. He has a caseworker Diplomatic Services operational officer) at [employer] [phone] who has assisted with resources and disability application which is pending. Patient spoke of a new church he started attending a few weeks ago and feels accepted there. He spoke of his challenges emotionally with life and appeared to feel some relief after discussion. CSW will continue to follow for support and resources as needed. Patient plans to meet with caseworker [name] and will provide this CSW number to assist with coordination of services. Lasandra Beech, LCSW [phone]

## 2014-08-05 ENCOUNTER — Other Ambulatory Visit: Payer: Self-pay | Admitting: *Deleted

## 2014-08-05 DIAGNOSIS — B20 Human immunodeficiency virus [HIV] disease: Secondary | ICD-10-CM

## 2014-08-05 MED ORDER — DOLUTEGRAVIR SODIUM 50 MG PO TABS: 50.0000 mg | ORAL_TABLET | Freq: Every day | ORAL | Status: DC

## 2014-08-05 MED ORDER — EMTRICITABINE-TENOFOVIR DF 200-300 MG PO TABS: 1.0000 | ORAL_TABLET | Freq: Every day | ORAL | Status: DC

## 2014-08-05 NOTE — Telephone Encounter (Signed)
ADAP Application 

## 2014-08-10 ENCOUNTER — Inpatient Hospital Stay (HOSPITAL_COMMUNITY): Admission: RE | Admit: 2014-08-10 | Payer: Self-pay | Source: Ambulatory Visit

## 2014-08-17 ENCOUNTER — Encounter (HOSPITAL_COMMUNITY): Payer: Self-pay

## 2014-08-17 ENCOUNTER — Ambulatory Visit (HOSPITAL_COMMUNITY)
Admission: RE | Admit: 2014-08-17 | Discharge: 2014-08-17 | Disposition: A | Discharge status: Home / Self Care | Payer: MEDICAID | Source: Ambulatory Visit | Attending: Cardiology | Admitting: Cardiology

## 2014-08-17 ENCOUNTER — Other Ambulatory Visit (HOSPITAL_COMMUNITY): Payer: Self-pay | Admitting: *Deleted

## 2014-08-17 ENCOUNTER — Encounter: Payer: Self-pay | Admitting: Licensed Clinical Social Worker

## 2014-08-17 NOTE — Progress Notes (Signed)
CSW referred to assist patient with [facility] card application and paperwork needed for [facility]. CSW contacted [facility] and discussed walk in application times and forwarded to patient. CSW completed paperwork for [facility] and returned to patient. Patient plans to apply tomorrow for orange card and will follow up with CSW as needed. CSW continues to be available as needed. Lasandra Beech, LCSW [phone]

## 2014-08-24 ENCOUNTER — Ambulatory Visit: Payer: Self-pay | Admitting: Internal Medicine

## 2014-08-24 ENCOUNTER — Ambulatory Visit (HOSPITAL_COMMUNITY)
Admission: RE | Admit: 2014-08-24 | Discharge: 2014-08-24 | Disposition: A | Discharge status: Home / Self Care | Payer: MEDICAID | Source: Ambulatory Visit | Attending: Internal Medicine | Admitting: Internal Medicine

## 2014-08-25 ENCOUNTER — Other Ambulatory Visit: Payer: Self-pay

## 2014-08-25 DIAGNOSIS — Z113 Encounter for screening for infections with a predominantly sexual mode of transmission: Secondary | ICD-10-CM

## 2014-08-25 DIAGNOSIS — B20 Human immunodeficiency virus [HIV] disease: Secondary | ICD-10-CM

## 2014-08-25 LAB — CBC
HEMATOCRIT: 42.1 % (ref 39.0–52.0)
Hemoglobin: 14.1 g/dL (ref 13.0–17.0)
MCH: 33.7 pg (ref 26.0–34.0)
MCHC: 33.5 g/dL (ref 30.0–36.0)
MCV: 100.5 fL — ABNORMAL HIGH (ref 78.0–100.0)
MPV: 11.5 fL (ref 8.6–12.4)
PLATELETS: 155 10*3/uL (ref 150–400)
RBC: 4.19 MIL/uL — ABNORMAL LOW (ref 4.22–5.81)
RDW: 15.6 % — AB (ref 11.5–15.5)
WBC: 4.1 10*3/uL (ref 4.0–10.5)

## 2014-08-25 NOTE — Addendum Note (Signed)
Addended bySteva Colder on: 08/25/2014 02:02 PM   Modules accepted: Orders

## 2014-08-25 NOTE — Addendum Note (Signed)
Addended by: Andree Coss on: 08/25/2014 02:31 PM   Modules accepted: Orders

## 2014-08-26 LAB — COMPREHENSIVE METABOLIC PANEL
ALK PHOS: 66 U/L (ref 39–117)
ALT: 25 U/L (ref 0–53)
AST: 21 U/L (ref 0–37)
Albumin: 3.6 g/dL (ref 3.5–5.2)
BILIRUBIN TOTAL: 0.3 mg/dL (ref 0.2–1.2)
BUN: 19 mg/dL (ref 6–23)
CO2: 25 mEq/L (ref 19–32)
Calcium: 9 mg/dL (ref 8.4–10.5)
Chloride: 95 mEq/L — ABNORMAL LOW (ref 96–112)
Creat: 1.05 mg/dL (ref 0.50–1.35)
GLUCOSE: 254 mg/dL — AB (ref 70–99)
Potassium: 4.3 mEq/L (ref 3.5–5.3)
SODIUM: 134 meq/L — AB (ref 135–145)
Total Protein: 6.9 g/dL (ref 6.0–8.3)

## 2014-08-26 LAB — T-HELPER CELL (CD4) - (RCID CLINIC ONLY)
CD4 T CELL HELPER: 35 % (ref 33–55)
CD4 T Cell Abs: 490 /uL (ref 400–2700)

## 2014-08-26 LAB — RPR

## 2014-08-27 LAB — HIV-1 RNA QUANT-NO REFLEX-BLD
HIV 1 RNA QUANT: 13743 {copies}/mL — AB (ref <20)
HIV-1 RNA QUANT, LOG: 4.14 {Log} — AB (ref <1.30)

## 2014-08-31 ENCOUNTER — Ambulatory Visit (HOSPITAL_COMMUNITY)
Admission: RE | Admit: 2014-08-31 | Discharge: 2014-08-31 | Disposition: A | Discharge status: Home / Self Care | Payer: MEDICAID | Source: Ambulatory Visit | Attending: Cardiology | Admitting: Cardiology

## 2014-08-31 ENCOUNTER — Encounter: Payer: Self-pay | Admitting: Internal Medicine

## 2014-08-31 ENCOUNTER — Ambulatory Visit: Payer: Self-pay | Attending: Internal Medicine | Admitting: Internal Medicine

## 2014-08-31 VITALS — BP 104/70 | HR 80 | Temp 98.0°F | Resp 16 | Wt 200.2 lb

## 2014-08-31 DIAGNOSIS — I5022 Chronic systolic (congestive) heart failure: Secondary | ICD-10-CM

## 2014-08-31 DIAGNOSIS — F17210 Nicotine dependence, cigarettes, uncomplicated: Secondary | ICD-10-CM | POA: Insufficient documentation

## 2014-08-31 DIAGNOSIS — E119 Type 2 diabetes mellitus without complications: Secondary | ICD-10-CM | POA: Insufficient documentation

## 2014-08-31 DIAGNOSIS — F329 Major depressive disorder, single episode, unspecified: Secondary | ICD-10-CM | POA: Insufficient documentation

## 2014-08-31 DIAGNOSIS — Z21 Asymptomatic human immunodeficiency virus [HIV] infection status: Secondary | ICD-10-CM | POA: Insufficient documentation

## 2014-08-31 DIAGNOSIS — Z8679 Personal history of other diseases of the circulatory system: Secondary | ICD-10-CM

## 2014-08-31 DIAGNOSIS — F17200 Nicotine dependence, unspecified, uncomplicated: Secondary | ICD-10-CM

## 2014-08-31 DIAGNOSIS — Z8659 Personal history of other mental and behavioral disorders: Secondary | ICD-10-CM

## 2014-08-31 DIAGNOSIS — B20 Human immunodeficiency virus [HIV] disease: Secondary | ICD-10-CM

## 2014-08-31 DIAGNOSIS — Z720 Tobacco use: Secondary | ICD-10-CM

## 2014-08-31 DIAGNOSIS — E139 Other specified diabetes mellitus without complications: Secondary | ICD-10-CM | POA: Insufficient documentation

## 2014-08-31 DIAGNOSIS — I10 Essential (primary) hypertension: Secondary | ICD-10-CM | POA: Insufficient documentation

## 2014-08-31 LAB — POCT GLYCOSYLATED HEMOGLOBIN (HGB A1C): HEMOGLOBIN A1C: 8.5

## 2014-08-31 LAB — GLUCOSE, POCT (MANUAL RESULT ENTRY): POC Glucose: 269 mg/dl — AB (ref 70–99)

## 2014-08-31 MED ORDER — ESCITALOPRAM OXALATE 10 MG PO TABS: 10.0000 mg | ORAL_TABLET | Freq: Every day | ORAL | Status: DC

## 2014-08-31 MED ORDER — METFORMIN HCL 500 MG PO TABS: 500.0000 mg | ORAL_TABLET | Freq: Two times a day (BID) | ORAL | Status: DC

## 2014-08-31 NOTE — Progress Notes (Signed)
MRN: [id number] Name: Shaun Brennan  Sex: male Age: 39 y.o. DOB: [birth year]  Allergies: Lisinopril  Chief Complaint  Patient presents with  . Follow-up    HPI: Patient is 39 y.o. male who has to of HIV, CHF, diabetes, depression comes today for followup, he is requesting refill on his medication, as per patient he then out of metformin for the last one month, he denies any hypoglycemic symptoms currently following up with his cardiologist as well as infectious disease Dr.patient also reported to following up with [facility] and is on Lexapro which she needs refill today.patient still smokes on and off, have counseled patient to quit smoking.He recently had a blood work done which was reviewed.   Past Medical History  Diagnosis Date  . HIV (human immunodeficiency virus infection)   . Diabetes mellitus without complication   . Hypertension   . Chronic systolic heart failure     a. EF 15-20%, grade II DD, LA mild/mod dilated  . DM2 (diabetes mellitus, type 2)     Past Surgical History  Procedure Laterality Date  . Ankle surgery Right       Medication List       This list is accurate as of: 08/31/14 12:52 PM.  Always use your most recent med list.               carvedilol 6.25 MG tablet  Commonly known as:  COREG  Take 3 tablets (18.75 mg total) by mouth 2 (two) times daily with a meal.     dolutegravir 50 MG tablet  Commonly known as:  TIVICAY  Take 1 tablet (50 mg total) by mouth daily.     emtricitabine-tenofovir 200-300 MG per tablet  Commonly known as:  TRUVADA  Take 1 tablet by mouth daily.     escitalopram 10 MG tablet  Commonly known as:  LEXAPRO  Take 1 tablet (10 mg total) by mouth at bedtime.     furosemide 40 MG tablet  Commonly known as:  LASIX  Take only on Mondays and Fridays     glucose monitoring kit monitoring kit  1 each by Does not apply route 4 (four) times daily - after meals and at bedtime. 1 month Diabetic Testing Supplies for QAC-QHS  accuchecks.     hydrALAZINE 50 MG tablet  Commonly known as:  APRESOLINE  Take 1 tablet (50 mg total) by mouth every 8 (eight) hours.     isosorbide mononitrate 30 MG 24 hr tablet  Commonly known as:  IMDUR  Take 1 tablet (30 mg total) by mouth daily.     losartan 50 MG tablet  Commonly known as:  COZAAR  Take 1 tablet (50 mg total) by mouth daily.     metFORMIN 500 MG tablet  Commonly known as:  GLUCOPHAGE  Take 1 tablet (500 mg total) by mouth 2 (two) times daily with a meal.        Meds ordered this encounter  Medications  . escitalopram (LEXAPRO) 10 MG tablet    Sig: Take 1 tablet (10 mg total) by mouth at bedtime.    Dispense:  30 tablet    Refill:  2  . metFORMIN (GLUCOPHAGE) 500 MG tablet    Sig: Take 1 tablet (500 mg total) by mouth 2 (two) times daily with a meal.    Dispense:  60 tablet    Refill:  3    Immunization History  Administered Date(s) Administered  . Influenza,inj,Quad PF,36+ Mos 12/12/2013  .  Pneumococcal Polysaccharide-23 01/13/2014    Family History  Problem Relation Age of Onset  . Other Father   . Diabetes Mother   . Stroke Paternal Grandfather     History  Substance Use Topics  . Smoking status: Current Every Day Smoker -- 0.25 packs/day    Types: Cigarettes  . Smokeless tobacco: Never Used     Comment: about 10 cigarettes a week  . Alcohol Use: Yes     Comment: socially about 6 pack per week     Review of Systems   As noted in HPI  Filed Vitals:   08/31/14 1211  BP: 104/70  Pulse: 80  Temp: 98 F (36.7 C)  Resp: 16    Physical Exam  Physical Exam  Constitutional: No distress.  Eyes: EOM are normal. Pupils are equal, round, and reactive to light.  Cardiovascular: Normal rate and regular rhythm.   Pulmonary/Chest: Breath sounds normal. No respiratory distress. He has no wheezes. He has no rales.    CBC    Component Value Date/Time   WBC 4.1 08/25/2014 1402   RBC 4.19* 08/25/2014 1402   HGB 14.1 08/25/2014  1402   HCT 42.1 08/25/2014 1402   PLT 155 08/25/2014 1402   MCV 100.5* 08/25/2014 1402   LYMPHSABS 1.3 07/08/2014 1114   MONOABS 0.8 07/08/2014 1114   EOSABS 0.1 07/08/2014 1114   BASOSABS 0.0 07/08/2014 1114    CMP     Component Value Date/Time   NA 134* 08/25/2014 1402   K 4.3 08/25/2014 1402   CL 95* 08/25/2014 1402   CO2 25 08/25/2014 1402   GLUCOSE 254* 08/25/2014 1402   BUN 19 08/25/2014 1402   CREATININE 1.05 08/25/2014 1402   CREATININE 0.94 07/14/2014 0755   CALCIUM 9.0 08/25/2014 1402   PROT 6.9 08/25/2014 1402   ALBUMIN 3.6 08/25/2014 1402   AST 21 08/25/2014 1402   ALT 25 08/25/2014 1402   ALKPHOS 66 08/25/2014 1402   BILITOT 0.3 08/25/2014 1402   GFRNONAA >90 07/14/2014 0755   GFRAA >90 07/14/2014 0755    Lab Results  Component Value Date/Time   CHOL 184 12/17/2013 09:12 AM    Lab Results  Component Value Date/Time   HGBA1C 8.50 08/31/2014 12:12 PM   HGBA1C 8.6* 07/08/2014 11:28 AM    Lab Results  Component Value Date/Time   AST 21 08/25/2014 02:02 PM    Assessment and Plan  Other specified diabetes mellitus without complications - Plan:  Results for orders placed or performed in visit on 08/31/14  Glucose (CBG)  Result Value Ref Range   POC Glucose 269.0 (A) 70 - 99 mg/dl  HgB A1c  Result Value Ref Range   Hemoglobin A1C 8.50    Advised patient for diabetes meal planning, resume back on metformin, repeat A1c in 3 months HgB A1c, metFORMIN (GLUCOPHAGE) 500 MG tablet  HIV (human immunodeficiency virus infection) Currently following up with ID.  Smoking Counseled patient to quit smoking.  History of depression - Plan:currently following up with [facility], denies SI or HI escitalopram (LEXAPRO) 10 MG tablet  H/O CHF Follow up with the cardiologist, currently on [place], hydralazine, Lasix ,Imdur    Return in about 3 months (around 12/01/2014) for diabetes.   This note has been created with Engineer, agricultural. Any transcriptional errors are unintentional.    Lorayne Marek, MD

## 2014-08-31 NOTE — Progress Notes (Signed)
Pt presented to office for pill box completion Assisted pt will filling box x 1 week  Multiple days medication were still in the box- advised pt should be taking all meds as prescribed Pt voiced understanding

## 2014-08-31 NOTE — Progress Notes (Signed)
Patient here for follow up on his diabetes and hypertension Patient also requesting refills on his medications

## 2014-09-08 ENCOUNTER — Other Ambulatory Visit (HOSPITAL_COMMUNITY): Payer: Self-pay

## 2014-09-18 ENCOUNTER — Telehealth (HOSPITAL_COMMUNITY): Payer: Self-pay | Admitting: *Deleted

## 2014-09-18 ENCOUNTER — Ambulatory Visit (HOSPITAL_COMMUNITY)
Admission: RE | Admit: 2014-09-18 | Discharge: 2014-09-18 | Disposition: A | Discharge status: Home / Self Care | Payer: MEDICAID | Source: Ambulatory Visit | Attending: Internal Medicine | Admitting: Internal Medicine

## 2014-09-18 DIAGNOSIS — I5022 Chronic systolic (congestive) heart failure: Secondary | ICD-10-CM

## 2014-09-18 MED ORDER — LOSARTAN POTASSIUM 50 MG PO TABS: 50.0000 mg | ORAL_TABLET | Freq: Every day | ORAL | Status: DC

## 2014-09-18 MED ORDER — CARVEDILOL 6.25 MG PO TABS: 18.7500 mg | ORAL_TABLET | Freq: Two times a day (BID) | ORAL | Status: DC

## 2014-09-18 MED ORDER — ISOSORBIDE MONONITRATE ER 30 MG PO TB24: 30.0000 mg | ORAL_TABLET | Freq: Every day | ORAL | Status: DC

## 2014-09-18 NOTE — Telephone Encounter (Signed)
Pt came in office today for a nurse visit so that I can fill his pill box.  Carvedilol losartan and IMDUR were out. Pt was advised to pick up refills from the outpatient pharmacy (free of charge w/ heart failure assistance program) last week during his pill box refill.   All other medications were current and did not need refills.  I told pt I could not continue to fill his pillbox if he does not pick up his medications. It defeats the purpose of the visit if we do not have all of his medications. Pt verbally stated he understood and would leave directly from here to pick up his medication refills. Pt is scheduled to come back next Friday for medication pill box refill (nurse visit with me)

## 2014-09-25 ENCOUNTER — Other Ambulatory Visit (HOSPITAL_COMMUNITY): Payer: Self-pay

## 2014-09-30 ENCOUNTER — Telehealth (HOSPITAL_COMMUNITY): Payer: Self-pay | Admitting: Vascular Surgery

## 2014-09-30 ENCOUNTER — Other Ambulatory Visit (HOSPITAL_COMMUNITY): Payer: Self-pay | Admitting: *Deleted

## 2014-09-30 DIAGNOSIS — I5022 Chronic systolic (congestive) heart failure: Secondary | ICD-10-CM

## 2014-09-30 MED ORDER — CARVEDILOL 6.25 MG PO TABS: 18.7500 mg | ORAL_TABLET | Freq: Two times a day (BID) | ORAL | Status: DC

## 2014-09-30 NOTE — Telephone Encounter (Signed)
Pt needs a refill on Carvedidlol

## 2014-10-05 ENCOUNTER — Ambulatory Visit (HOSPITAL_COMMUNITY)
Admission: RE | Admit: 2014-10-05 | Discharge: 2014-10-05 | Disposition: A | Discharge status: Home / Self Care | Payer: Self-pay | Source: Ambulatory Visit | Attending: Internal Medicine | Admitting: Internal Medicine

## 2014-10-05 ENCOUNTER — Encounter (HOSPITAL_COMMUNITY): Payer: Self-pay

## 2014-10-05 VITALS — BP 126/80 | HR 88 | Wt 208.0 lb

## 2014-10-05 DIAGNOSIS — F17210 Nicotine dependence, cigarettes, uncomplicated: Secondary | ICD-10-CM | POA: Insufficient documentation

## 2014-10-05 DIAGNOSIS — Z833 Family history of diabetes mellitus: Secondary | ICD-10-CM | POA: Insufficient documentation

## 2014-10-05 DIAGNOSIS — Z21 Asymptomatic human immunodeficiency virus [HIV] infection status: Secondary | ICD-10-CM | POA: Insufficient documentation

## 2014-10-05 DIAGNOSIS — Z590 Homelessness: Secondary | ICD-10-CM | POA: Insufficient documentation

## 2014-10-05 DIAGNOSIS — Z79899 Other long term (current) drug therapy: Secondary | ICD-10-CM | POA: Insufficient documentation

## 2014-10-05 DIAGNOSIS — I5022 Chronic systolic (congestive) heart failure: Secondary | ICD-10-CM

## 2014-10-05 DIAGNOSIS — E119 Type 2 diabetes mellitus without complications: Secondary | ICD-10-CM | POA: Insufficient documentation

## 2014-10-05 DIAGNOSIS — F1010 Alcohol abuse, uncomplicated: Secondary | ICD-10-CM | POA: Insufficient documentation

## 2014-10-05 DIAGNOSIS — I10 Essential (primary) hypertension: Secondary | ICD-10-CM | POA: Insufficient documentation

## 2014-10-05 LAB — BASIC METABOLIC PANEL
Anion gap: 9 (ref 5–15)
BUN: 15 mg/dL (ref 6–20)
CHLORIDE: 97 mmol/L — AB (ref 101–111)
CO2: 26 mmol/L (ref 22–32)
CREATININE: 1.01 mg/dL (ref 0.61–1.24)
Calcium: 9 mg/dL (ref 8.9–10.3)
Glucose, Bld: 195 mg/dL — ABNORMAL HIGH (ref 65–99)
Potassium: 3.8 mmol/L (ref 3.5–5.1)
Sodium: 132 mmol/L — ABNORMAL LOW (ref 135–145)

## 2014-10-05 MED ORDER — CARVEDILOL 25 MG PO TABS: 25.0000 mg | ORAL_TABLET | Freq: Two times a day (BID) | ORAL | Status: DC

## 2014-10-05 NOTE — Patient Instructions (Signed)
Labs today  INCREASE Carvedilol to 25 mg, one tab twice a day  Your physician has requested that you have an echocardiogram. Echocardiography is a painless test that uses sound waves to create images of your heart. It provides your doctor with information about the size and shape of your heart and how well your heart's chambers and valves are working. This procedure takes approximately one hour. There are no restrictions for this procedure.  Your physician recommends that you schedule a follow-up appointment in: 1 month with echo  Do the following things EVERYDAY: 1) Weigh yourself in the morning before breakfast. Write it down and keep it in a log. 2) Take your medicines as prescribed 3) Eat low salt foods-Limit salt (sodium) to 2000 mg per day.  4) Stay as active as you can everyday 5) Limit all fluids for the day to less than 2 liters 6)

## 2014-10-05 NOTE — Progress Notes (Signed)
Patient ID: Champ Mungo, male   DOB: 10-24-1975, 39 y.o.   MRN: [id number]  PCP: Dr. Annitta Needs [facility]) Primary Cardiologist: Dr. Haroldine Laws  HPI: Mr. Arneson is a 39 yo male with a history of HIV, DM2, HTN, tobacco abuse and chronic systolic heart failure (due to presumed NICM EF 25-30% - no cath). Hospitalized in 4/16 with ADHF because he was off meds while in jail, has since been taking medications regularly with nurse help from HF clinic to stock pill poxes.  Follow up for Heart Failure:  Says he stills gets fatigued easily. Can walk 1-2 city blocks prior to needing to sit down. Says he got a little short of breath yesterday walking 4 blocks in a row, but otherwise hasn't had any problem. Feeling pretty good. Denies PND/Orthopnea.  Down 2-3 beers on the weekend. Smoking 2-3 cigarrettes a day. Not weighing daily, currently homeless. Still eating at shelters for lunch or panhandling downtown. Staying at the [facility], has been for about 3 months.  Says he is out of bus passes, had a cab called for him today.   Labs 03/31/14 K 4.1 Creatinine 1.10  Negative Hep B surface antigen and negative HCV AB; RPR non-reactive.  Labs 08/25/14 K 4.3, Creatinine 1.05  ECHO (12/08/2013): EF 15-20%, grade II DD, mild/mod LA, RV normal ECHO 04/27/2014 EF 25-30% RV mildy HK. Not seen well.   ROS: All systems negative except as listed in HPI, PMH and Problem List.  SH:  History   Social History  . Marital Status: Single    Spouse Name: N/A  . Number of Children: N/A  . Years of Education: N/A   Occupational History  . Not on file.   Social History Main Topics  . Smoking status: Current Every Day Smoker -- 0.25 packs/day    Types: Cigarettes  . Smokeless tobacco: Never Used     Comment: about 10 cigarettes a week  . Alcohol Use: Yes     Comment: socially about 6 pack per week   . Drug Use: Yes    Special: Marijuana  . Sexual Activity: Yes     Comment: pt. given condoms    Other Topics Concern  . Not on file   Social History Narrative   Currently homeless    FH:  Family History  Problem Relation Age of Onset  . Other Father   . Diabetes Mother   . Stroke Paternal Grandfather     Past Medical History  Diagnosis Date  . HIV (human immunodeficiency virus infection)   . Diabetes mellitus without complication   . Hypertension   . Chronic systolic heart failure     a. EF 15-20%, grade II DD, LA mild/mod dilated  . DM2 (diabetes mellitus, type 2)     Current Outpatient Prescriptions  Medication Sig Dispense Refill  . carvedilol (COREG) 6.25 MG tablet Take 3 tablets (18.75 mg total) by mouth 2 (two) times daily with a meal. 180 tablet 1  . cetirizine (ZYRTEC) 10 MG tablet Take 10 mg by mouth daily.    . dolutegravir (TIVICAY) 50 MG tablet Take 1 tablet (50 mg total) by mouth daily. 30 tablet 5  . emtricitabine-tenofovir (TRUVADA) 200-300 MG per tablet Take 1 tablet by mouth daily. 30 tablet 5  . furosemide (LASIX) 40 MG tablet Take only on Mondays and Fridays 12 tablet 2  . hydrALAZINE (APRESOLINE) 50 MG tablet Take 1 tablet (50 mg total) by mouth every 8 (eight) hours. 90 tablet  3  . isosorbide mononitrate (IMDUR) 30 MG 24 hr tablet Take 1 tablet (30 mg total) by mouth daily. 30 tablet 3  . losartan (COZAAR) 50 MG tablet Take 1 tablet (50 mg total) by mouth daily. 30 tablet 3  . metFORMIN (GLUCOPHAGE) 500 MG tablet Take 1 tablet (500 mg total) by mouth 2 (two) times daily with a meal. 60 tablet 3  . glucose monitoring kit (FREESTYLE) monitoring kit 1 each by Does not apply route 4 (four) times daily - after meals and at bedtime. 1 month Diabetic Testing Supplies for QAC-QHS accuchecks. (Patient not taking: Reported on 07/27/2014) 1 each 1   No current facility-administered medications for this encounter.    Filed Vitals:   10/05/14 1040  BP: 126/80  Pulse: 88  Weight: 208 lb (94.348 kg)  SpO2: 96%    PHYSICAL EXAM: General:  Fatigued  appearing. No resp difficulty HEENT: normal Neck: supple. JVP 6-7; Carotids 2+ bilaterally; no bruits. No lymphadenopathy or thryomegaly appreciated. Cor: PMI normal. Regular rate & rhythm. No rubs, gallops or murmurs. Lungs: clear Abdomen: soft, nontender, nondistended. No hepatosplenomegaly. No bruits or masses. Good bowel sounds. Extremities: no cyanosis, clubbing, rash, Trace edema bilateral LEs Neuro: alert & orientedx3, cranial nerves grossly intact. Moves all 4 extremities w/o difficulty. Affect pleasant.   ASSESSMENT & PLAN:  1) Chronic systolic HF: NICM?, EF 09-38%  (04/27/2014).  - NYHA II symptoms. Much improved now that he is taking his meds. Volume status ok - Continue lasix 40 mg on Mondays and Fridays.  - Increase carvedilol to 25 bid - Continue hydralazine 50 mg tid and Imdur 30 mg daily.  - Continue losartan 50 mg daily. Will hold off on Entresto for now given financial situation - No Ace due to cough.  - Has no insurance therefore can not get ICD.  - Working with paramedicine - Using pill boxes and has all of his medicines. Had missed several afternoon and evening doses. -2) HTN - Better now that he is taking medicine. 3) HIV - Was able to get his medicines filled and is currently taking all of them - Follows with ID 4) Social Issues - [place] following 5) Tobacco abuse: - Reports smoking about 2-3 cigarettes a day. Encouraged to try and abstain completely.  6) Alcohol abuse: - Discussed trying to abstain completely. Still drinking several beers on the weekends.    Follow up in HF clinic in 1 month.   Shirley Friar, PA-C 11:21 AM  Patient seen and examined with Oda Kilts, PA-C. We discussed all aspects of the encounter. I agree with the assessment and plan as stated above.   Much improved with Paramedicine and nurse support. Volume status minimally elevated. Reinforced need for daily weights and reviewed use of sliding scale  diuretics.  Will increase carvedilol to 25 bid. Will need f/u echo to reassess EF. Needs to abstain from ETOH and tobacco.   Rye Decoste,MD 11:48 AM

## 2014-11-03 ENCOUNTER — Inpatient Hospital Stay (HOSPITAL_COMMUNITY): Admission: RE | Admit: 2014-11-03 | Payer: Self-pay | Source: Ambulatory Visit

## 2014-11-03 ENCOUNTER — Ambulatory Visit (HOSPITAL_COMMUNITY): Payer: MEDICAID

## 2014-11-09 ENCOUNTER — Other Ambulatory Visit (HOSPITAL_COMMUNITY): Payer: Self-pay | Admitting: Cardiology

## 2014-11-09 ENCOUNTER — Encounter (HOSPITAL_COMMUNITY): Payer: Self-pay | Admitting: *Deleted

## 2014-11-09 DIAGNOSIS — I5022 Chronic systolic (congestive) heart failure: Secondary | ICD-10-CM

## 2014-11-09 MED ORDER — CARVEDILOL 25 MG PO TABS: 25.0000 mg | ORAL_TABLET | Freq: Two times a day (BID) | ORAL | Status: DC

## 2014-11-23 ENCOUNTER — Telehealth (HOSPITAL_COMMUNITY): Payer: Self-pay | Admitting: *Deleted

## 2014-11-23 NOTE — Telephone Encounter (Signed)
Katie said pts bp is 150/100 but he did not take his noon time hydralazine no swelling, shortness of breath, etc. Advised no changes at this time (per [facility])

## 2014-11-30 ENCOUNTER — Encounter (HOSPITAL_COMMUNITY): Payer: Self-pay

## 2014-11-30 ENCOUNTER — Ambulatory Visit (HOSPITAL_BASED_OUTPATIENT_CLINIC_OR_DEPARTMENT_OTHER)
Admission: RE | Admit: 2014-11-30 | Discharge: 2014-11-30 | Disposition: A | Discharge status: Home / Self Care | Payer: Self-pay | Source: Ambulatory Visit | Attending: Cardiology | Admitting: Cardiology

## 2014-11-30 ENCOUNTER — Ambulatory Visit (HOSPITAL_COMMUNITY)
Admission: RE | Admit: 2014-11-30 | Discharge: 2014-11-30 | Disposition: A | Discharge status: Home / Self Care | Payer: Self-pay | Source: Ambulatory Visit | Attending: Internal Medicine | Admitting: Internal Medicine

## 2014-11-30 VITALS — BP 122/76 | HR 79 | Wt 208.0 lb

## 2014-11-30 DIAGNOSIS — I10 Essential (primary) hypertension: Secondary | ICD-10-CM | POA: Insufficient documentation

## 2014-11-30 DIAGNOSIS — Z590 Homelessness: Secondary | ICD-10-CM | POA: Insufficient documentation

## 2014-11-30 DIAGNOSIS — B20 Human immunodeficiency virus [HIV] disease: Secondary | ICD-10-CM | POA: Insufficient documentation

## 2014-11-30 DIAGNOSIS — F17210 Nicotine dependence, cigarettes, uncomplicated: Secondary | ICD-10-CM | POA: Insufficient documentation

## 2014-11-30 DIAGNOSIS — E119 Type 2 diabetes mellitus without complications: Secondary | ICD-10-CM | POA: Insufficient documentation

## 2014-11-30 DIAGNOSIS — I5022 Chronic systolic (congestive) heart failure: Secondary | ICD-10-CM

## 2014-11-30 DIAGNOSIS — Z833 Family history of diabetes mellitus: Secondary | ICD-10-CM | POA: Insufficient documentation

## 2014-11-30 DIAGNOSIS — Z79899 Other long term (current) drug therapy: Secondary | ICD-10-CM | POA: Insufficient documentation

## 2014-11-30 LAB — BASIC METABOLIC PANEL
Anion gap: 9 (ref 5–15)
BUN: 15 mg/dL (ref 6–20)
CALCIUM: 9.9 mg/dL (ref 8.9–10.3)
CHLORIDE: 98 mmol/L — AB (ref 101–111)
CO2: 28 mmol/L (ref 22–32)
CREATININE: 0.96 mg/dL (ref 0.61–1.24)
GFR calc non Af Amer: >60 mL/min (ref >60)
Glucose, Bld: 106 mg/dL — ABNORMAL HIGH (ref 65–99)
Potassium: 4.3 mmol/L (ref 3.5–5.1)
Sodium: 135 mmol/L (ref 135–145)

## 2014-11-30 LAB — BRAIN NATRIURETIC PEPTIDE: B Natriuretic Peptide: 23.2 pg/mL (ref 0.0–100.0)

## 2014-11-30 MED ORDER — LOSARTAN POTASSIUM 100 MG PO TABS: 100.0000 mg | ORAL_TABLET | Freq: Every day | ORAL | Status: DC

## 2014-11-30 NOTE — Patient Instructions (Signed)
Routine lab work today. Will notify you of abnormal results, otherwise no news is good news!  INCREASE Losartan to 100mg  tablet once daily. Rx sent to [facility] - [place], [place] - [id number] [Patient Preferred] [phone]  Return in 1-2 weeks for lab work.  Follow up 2 months.  Do the following things EVERYDAY: 1) Weigh yourself in the morning before breakfast. Write it down and keep it in a log. 2) Take your medicines as prescribed 3) Eat low salt foods-Limit salt (sodium) to 2000 mg per day.  4) Stay as active as you can everyday 5) Limit all fluids for the day to less than 2 liters

## 2014-11-30 NOTE — Progress Notes (Signed)
*  PRELIMINARY RESULTS* Echocardiogram 2D Echocardiogram has been performed.  Jeryl Columbia 11/30/2014, 2:20 PM

## 2014-11-30 NOTE — Progress Notes (Signed)
Patient ID: Shaun Brennan, male   DOB: 30-Mar-1976, 39 y.o.   MRN: [id number]  PCP: Dr. Annitta Needs [facility]) Primary Cardiologist: Dr. Haroldine Laws  HPI: Shaun Brennan is a 39 yo male with a history of HIV, DM2, HTN, tobacco abuse and chronic systolic heart failure (due to presumed NICM EF 25-30% - no cath). Hospitalized in 4/16 with ADHF because he was off meds while in jail, has since been taking medications regularly with nurse help from HF clinic to stock pill poxes.  Follow up for Heart Failure:  At last visit we increased his coreg. Tolerating well.  No hypotension, lightheadedness, or dizziness. Overall feels pretty good, although still has some fatigue. Can walk 2-3 city blocks before he needs to sit down. Doesn't feel like he has gotten much better but is not getting worse. Denies PND/Orthopnea.  Drinks up to a 6 pack on the weekend. Smoking 3 cigarrettes a day.  Living at the [facility] now.  Gets dinner there.  Usually eats breakfast and lunch at [facility].  Labs 03/31/14 K 4.1 Creatinine 1.10  Negative Hep B surface antigen and negative HCV AB; RPR non-reactive.  Labs 08/25/14 K 4.3, Creatinine 1.05  ECHO (12/08/2013): EF 15-20%, grade II DD, mild/mod LA, RV normal ECHO 04/27/2014 EF 25-30% RV mildy HK. Not seen well.  ECHO 11/30/14 preliminary results 40-45%  ROS: All systems negative except as listed in HPI, PMH and Problem List.  SH:  Social History   Social History  . Marital Status: Single    Spouse Name: N/A  . Number of Children: N/A  . Years of Education: N/A   Occupational History  . Not on file.   Social History Main Topics  . Smoking status: Current Every Day Smoker -- 0.25 packs/day    Types: Cigarettes  . Smokeless tobacco: Never Used     Comment: about 10 cigarettes a week  . Alcohol Use: Yes     Comment: socially about 6 pack per week   . Drug Use: Yes    Special: Marijuana  . Sexual Activity: Yes     Comment: pt. given condoms    Other Topics Concern  . Not on file   Social History Narrative   Currently homeless    FH:  Family History  Problem Relation Age of Onset  . Other Father   . Diabetes Mother   . Stroke Paternal Grandfather     Past Medical History  Diagnosis Date  . HIV (human immunodeficiency virus infection)   . Diabetes mellitus without complication   . Hypertension   . Chronic systolic heart failure     a. EF 15-20%, grade II DD, LA mild/mod dilated  . DM2 (diabetes mellitus, type 2)     Current Outpatient Prescriptions  Medication Sig Dispense Refill  . carvedilol (COREG) 25 MG tablet Take 1 tablet (25 mg total) by mouth 2 (two) times daily with a meal. 60 tablet 3  . cetirizine (ZYRTEC) 10 MG tablet Take 10 mg by mouth daily.    . dolutegravir (TIVICAY) 50 MG tablet Take 1 tablet (50 mg total) by mouth daily. 30 tablet 5  . emtricitabine-tenofovir (TRUVADA) 200-300 MG per tablet Take 1 tablet by mouth daily. 30 tablet 5  . furosemide (LASIX) 40 MG tablet Take only on Mondays and Fridays 12 tablet 2  . glucose monitoring kit (FREESTYLE) monitoring kit 1 each by Does not apply route 4 (four) times daily - after meals and at bedtime. 1  month Diabetic Testing Supplies for QAC-QHS accuchecks. 1 each 1  . hydrALAZINE (APRESOLINE) 50 MG tablet Take 1 tablet (50 mg total) by mouth every 8 (eight) hours. 90 tablet 3  . isosorbide mononitrate (IMDUR) 30 MG 24 hr tablet Take 1 tablet (30 mg total) by mouth daily. 30 tablet 3  . losartan (COZAAR) 50 MG tablet Take 1 tablet (50 mg total) by mouth daily. 30 tablet 3  . metFORMIN (GLUCOPHAGE) 500 MG tablet Take 1 tablet (500 mg total) by mouth 2 (two) times daily with a meal. 60 tablet 3   No current facility-administered medications for this encounter.    Filed Vitals:   11/30/14 1427  BP: 122/76  Pulse: 79  Weight: 208 lb (94.348 kg)  SpO2: 99%    PHYSICAL EXAM: General:  Well appearing. NAD HEENT: normal Neck: supple. JVP 6-7;  Carotids 2+ bilaterally; no bruits. No lymphadenopathy or thryomegaly. Cor: PMI normal. Regular rate & rhythm. No rubs, gallops or murmurs. Lungs: CTA Abdomen: obese, nontender, nondistended. No HSM. No bruits or masses. +BS Extremities: no cyanosis, clubbing, rash, Trace ankle edema bilateral Neuro: alert & orientedx3, cranial nerves grossly intact. Moves all 4 extremities w/o difficulty. Affect pleasant.   ASSESSMENT & PLAN:  1) Chronic systolic HF: NICM?, EF 45-36%  (04/27/2014).  Echo today reviewed by Dr Haroldine Laws, appears to be improved to 40-45%. - NYHA II symptoms. Much improved now that he is taking his meds. Volume status good - Continue lasix 40 mg on Mondays and Fridays.  - Continue carvedilol to 25 bid - Continue hydralazine 50 mg tid and Imdur 30 mg daily.  - Increase losartan to 100 mg daily. Will hold off on Entresto for now given financial situation - No Ace due to cough.  - Has no insurance therefore can not get ICD.  - Working with paramedicine - Using pill boxes and has all of his medicines. Had missed several afternoon and evening doses. -2) HTN - Better now that he is taking medicine. 3) HIV - Was able to get his medicines filled and is currently taking all of them - Follows with ID 4) Social Issues - Stays at [employer]. Trying to get disability. - SW following 5) Tobacco abuse: - Reports smoking about 2-3 cigarettes a day. Encouraged to try and abstain completely.  6) Alcohol abuse: - Discussed trying to abstain completely. Still drinking on the weekends.    Follow up in HF clinic in 2 month.   Shirley Friar, PA-C 2:41 PM  Patient seen and examined with Oda Kilts, PA-C. We discussed all aspects of the encounter. I agree with the assessment and plan as stated above.   Continues to improve. Echo reviewed personally. EF now looks to be about 45%. Volume status looks good. Increase losartan to 100 daily. Counseled on need to abstain from ETOH  completely.   Shaun Lovick,MD 3:40 PM

## 2014-12-07 ENCOUNTER — Telehealth: Payer: Self-pay | Admitting: Internal Medicine

## 2014-12-07 NOTE — Telephone Encounter (Signed)
Patient came into facility to request a prescription for a glucometer. Please f/u with pt.

## 2014-12-09 ENCOUNTER — Encounter: Payer: Self-pay | Admitting: Infectious Diseases

## 2014-12-09 ENCOUNTER — Ambulatory Visit: Payer: Self-pay | Admitting: *Deleted

## 2014-12-09 ENCOUNTER — Ambulatory Visit (INDEPENDENT_AMBULATORY_CARE_PROVIDER_SITE_OTHER): Payer: Self-pay | Admitting: Infectious Diseases

## 2014-12-09 VITALS — BP 151/99 | HR 79 | Temp 98.1°F | Ht 65.0 in | Wt 207.0 lb

## 2014-12-09 VITALS — BP 151/99 | HR 79 | Temp 98.1°F

## 2014-12-09 DIAGNOSIS — Z21 Asymptomatic human immunodeficiency virus [HIV] infection status: Secondary | ICD-10-CM

## 2014-12-09 DIAGNOSIS — B20 Human immunodeficiency virus [HIV] disease: Secondary | ICD-10-CM

## 2014-12-09 DIAGNOSIS — E1169 Type 2 diabetes mellitus with other specified complication: Secondary | ICD-10-CM

## 2014-12-09 DIAGNOSIS — Z23 Encounter for immunization: Secondary | ICD-10-CM

## 2014-12-09 DIAGNOSIS — I10 Essential (primary) hypertension: Secondary | ICD-10-CM

## 2014-12-09 NOTE — Progress Notes (Signed)
   Subjective:    Patient ID: Shaun Brennan, male    DOB: 08/06/1975, 39 y.o.   MRN: [id number]  HPI 39 y.o. male with history of HIV diagnosed approximately 10 years ago not been on any medication (previoulsy followed in [place]), diabetes mellitus, hypertension, history of CHF who states has been out of his medications for approximately a month. He had never been on ART prior. He was homeless, and presented to the emergency room (8-29) with a one-month history of a productive cough occasionally yellowish, shortness of breath, chest pain associated with hacking cough. He was found to have EF 15-20% and was eval by CHF team. He was started on DTGV/TRV. He had difficulty with getting these and has had poor f/u.  He has home health nurse who fills a pill box for him.  His BP has been good at home. Needs glucometer. Goes to [employer] [facility].  He had TTE 30-35% (8-22). He continues to follow at heart failure clinic.   HIV 1 RNA QUANT (copies/mL)  Date Value  08/25/2014 13743*  07/07/2014 60  12/06/2013 10387*   CD4 T CELL ABS (/uL)  Date Value  08/25/2014 490  12/06/2013 270*    Review of Systems  Constitutional: Negative for fever, chills, appetite change and unexpected weight change.  Respiratory: Negative for shortness of breath.   Cardiovascular: Positive for leg swelling. Negative for chest pain.  Gastrointestinal: Negative for diarrhea and constipation.  Genitourinary: Negative for difficulty urinating.  Neurological: Negative for numbness.  no eye exam this year.       Objective:   Physical Exam  Constitutional: He appears well-developed and well-nourished.  HENT:  Mouth/Throat: No oropharyngeal exudate.  Eyes: EOM are normal. Pupils are equal, round, and reactive to light.  Neck: Neck supple.  Cardiovascular: Normal rate, regular rhythm and normal heart sounds.   Pulmonary/Chest: Effort normal and breath sounds normal.  Abdominal: Soft. Bowel sounds are  normal. There is no tenderness.  Musculoskeletal: He exhibits edema.  No diabetic foot wounds. He has moist skin between toes. Onychomycosis.   Lymphadenopathy:    He has no cervical adenopathy.  Neurological:  Light touch grossly normal.           Assessment & Plan:

## 2014-12-09 NOTE — Assessment & Plan Note (Signed)
He will f/u with his PCP and CHF clinic. He will also f/u with his home health RN, says his BP at home are fine.

## 2014-12-09 NOTE — Assessment & Plan Note (Addendum)
Gets flu shot today.  Get him into dental clinic Will check his RNA and genotype.  See him back in 6-8 weeks.  Given condoms.  Get him into ophtho.  Loraine Maple for home health monitoring.

## 2014-12-09 NOTE — Assessment & Plan Note (Addendum)
Work with [employer] [facility], home Geneticist, molecular. Work on Aeronautical engineer. Work on getting eye exam.

## 2014-12-10 ENCOUNTER — Telehealth: Payer: Self-pay | Admitting: *Deleted

## 2014-12-10 LAB — HIV-1 RNA ULTRAQUANT REFLEX TO GENTYP+: HIV-1 RNA Quant, Log: <1.3 {Log} (ref <1.30)

## 2014-12-10 NOTE — Telephone Encounter (Signed)
Dr. Ninetta Lights notes that the patient needs an eye exam.  Unsure if patient has Orange Card for referral to [facility].  Patient is established with Dr. Orpah Cobb for primary care.  Left message for [name] to see if the patient has an orange card. Andree Coss, RN

## 2014-12-11 ENCOUNTER — Encounter: Payer: Self-pay | Admitting: *Deleted

## 2014-12-11 NOTE — Patient Instructions (Addendum)
RN was contacted at the clinic and asked to meet with the patient at this time. A warm handoff was performed at this time and RN conducted a visit with the patient at this time. RN educated the patient on my available services as a Insurance risk surveyor. RN reviewed Transport planner, [place], and Home Safety Management Information Booklet. Home Fire Safety Assessment, Fall Assessment and Suicide Risk Assessment was performed at this time. RN also discussed information on a Living Will, Advanced Directives, and [place]. RN and Client signed Client Agreement and Consents for service form along with Patient Rights and Responsibilities statement. RN developed a patient centered and specific plan of care. RN provided the client with contact information and reviewed how to receive emergency help after hours for schedule changes, reporting of safety issues, falls, concerns or any needs/questions. Standard precautions and Infection Control along with interventions to correct or prevent high risk behaviors instructed to the patient. Client reports understanding and agreement with the above.    Pt states he is currently living at the [employer] but has been approved for Housing through [employer]. Next week the patient has plans to meet with the Bridge Counselor([name]) in search of housing. RN made plans with the patient at this time to meet again next week to initiate his continued visit. RN would like to connect with the [facility] nurse to see what goals have been met and when her plans to discharge will be. RN's current focus is to have all disciplines involved with the patient following the same plan.

## 2014-12-11 NOTE — Progress Notes (Signed)
Patient ID: [name], male   DOB: 04-17-1975, 39 y.o.   MRN: [id number] Order received on Dec 09, 2014 by Dr.Jeffrey Ninetta Lights to evaluate patient for [facility]).  Patient was evaluated on December 09, 2014 for [facility].  Patient was consented to care at this time.    Frequency / Duration of [facility] visits: Effective 12/09/2014 1wk6, 68mo1, 79mo1, 3PRN's for complications with disease process/progression, medication changes or concerns   [facility] will assess for learning needs related to diagnosis and treatment regimen, provide education as needed, fill pill box with correct regimen as needed, fill syringes if liquid medication is required, communicate with care team including physician, case manager and social workers. / Individualized  Plan Of Care: Certification Period of 12/09/2014 to 03/09/2015 a. Type of service(s) and care to be delivered: RN, [employer] Based Health Care Nurse [facility]) b. Frequency and duration of service:Effective 12/09/2014 1wk6, 41mo1, 38mo1, 3PRN's for complications with disease process/progression, medication changes or concerns c. Activity restrictions: Pt may be up as tolerated with the ambulatory assistance of a single cane for stability d. Safety Measures: Use of assistive devices(single cane), Safety in ADL's, Standard Precautions/Infection Control e. Service Objectives and Goals: Service Objectives are to assist the pt with HIV medication regimen adherence and staying in care with the Infectious Disease Clinic by identifying barriers to care. RN will address the barriers that are identified by the patient. Pt Centered goal is to secure Housing and continue getting better and stay on his medications. RN's goals is to assist the patient with staying connected with [employer]. Pt has plans to meet [name]/Bridge Counselor to find available housing. RN will assess the patient's medication management set up and offer assistance as need  to keep the pt's regimen simplistic and easy to follow. RN will provide education to the patient on ways to manage his co-morbidities to decrease the risk of rehospitalization, reach the objective of medication adherence and pt's goal of getting better. f. Equipment required: No additional equipment required, pt currently has a single cane g. Functional Limitations:No limitations at this time h. Rehabilitation potential: Fair i. Diet and Nutritional Needs: No added salt or Sugar diet j. Medications and treatments: Medications have been reconciled and reviewed. Medication list is a part of this EPIC electronic chart k. Specific therapies if needed: RN l. Pertinent diagnoses; HIV, Type 2 Diabetes Mellitus, HTN, Chronic Systolic Heart Failure (EF 15 to 20%) m. Expected outcome: Fair

## 2014-12-15 ENCOUNTER — Telehealth: Payer: Self-pay | Admitting: *Deleted

## 2014-12-15 ENCOUNTER — Encounter: Payer: Self-pay | Admitting: *Deleted

## 2014-12-15 NOTE — Telephone Encounter (Signed)
I approve of this care plan.

## 2014-12-15 NOTE — Telephone Encounter (Signed)
Patient ID: Shaun Brennan, male   DOB: 03/03/1976, 38 y.o.   MRN: [id number] Order received on Dec 09, 2014 by Dr.Jeffrey Hatcher to evaluate patient for Community Based Health Care Nursing Services ([facility]).  Patient was evaluated on December 09, 2014 for [facility].  Patient was consented to care at this time.    Frequency / Duration of [facility]N visits: Effective 12/09/2014 1wk6, 1mo1, 2mo1, 3PRN's for complications with disease process/progression, medication changes or concerns   [facility] will assess for learning needs related to diagnosis and treatment regimen, provide education as needed, fill pill box with correct regimen as needed, fill syringes if liquid medication is required, communicate with care team including physician, case manager and social workers. / Individualized  Plan Of Care: Certification Period of 12/09/2014 to 03/09/2015 a. Type of service(s) and care to be delivered: RN, Community Based Health Care Nurse ([facility]) b. Frequency and duration of service:Effective 12/09/2014 1wk6, 1mo1, 2mo1, 3PRN's for complications with disease process/progression, medication changes or concerns c. Activity restrictions: Pt may be up as tolerated with the ambulatory assistance of a single cane for stability d. Safety Measures: Use of assistive devices(single cane), Safety in ADL's, Standard Precautions/Infection Control e. Service Objectives and Goals: Service Objectives are to assist the pt with HIV medication regimen adherence and staying in care with the Infectious Disease Clinic by identifying barriers to care. RN will address the barriers that are identified by the patient. Pt Centered goal is to secure Housing and continue getting better and stay on his medications. RN's goals is to assist the patient with staying connected with Bridge Counselor. Pt has plans to meet [name]/Bridge Counselor to find available housing. RN will assess the patient's medication management set up and offer assistance as need  to keep the pt's regimen simplistic and easy to follow. RN will provide education to the patient on ways to manage his co-morbidities to decrease the risk of rehospitalization, reach the objective of medication adherence and pt's goal of getting better. f. Equipment required: No additional equipment required, pt currently has a single cane g. Functional Limitations:No limitations at this time h. Rehabilitation potential: Fair i. Diet and Nutritional Needs: No added salt or Sugar diet j. Medications and treatments: Medications have been reconciled and reviewed. Medication list is a part of this EPIC electronic chart k. Specific therapies if needed: RN l. Pertinent diagnoses; HIV, Type 2 Diabetes Mellitus, HTN, Chronic Systolic Heart Failure (EF 15 to 20%) m. Expected outcome: Fair 

## 2014-12-16 ENCOUNTER — Ambulatory Visit: Payer: Self-pay | Admitting: *Deleted

## 2014-12-16 ENCOUNTER — Ambulatory Visit (HOSPITAL_COMMUNITY)
Admission: RE | Admit: 2014-12-16 | Discharge: 2014-12-16 | Disposition: A | Discharge status: Home / Self Care | Payer: Self-pay | Source: Ambulatory Visit | Attending: Cardiology | Admitting: Cardiology

## 2014-12-16 VITALS — BP 159/101 | HR 90 | Temp 98.1°F | Resp 19

## 2014-12-16 DIAGNOSIS — I10 Essential (primary) hypertension: Secondary | ICD-10-CM

## 2014-12-16 DIAGNOSIS — I5022 Chronic systolic (congestive) heart failure: Secondary | ICD-10-CM | POA: Insufficient documentation

## 2014-12-16 DIAGNOSIS — B20 Human immunodeficiency virus [HIV] disease: Secondary | ICD-10-CM

## 2014-12-16 LAB — BASIC METABOLIC PANEL
ANION GAP: 8 (ref 5–15)
BUN: 9 mg/dL (ref 6–20)
CALCIUM: 9.8 mg/dL (ref 8.9–10.3)
CO2: 31 mmol/L (ref 22–32)
Chloride: 97 mmol/L — ABNORMAL LOW (ref 101–111)
Creatinine, Ser: 1.03 mg/dL (ref 0.61–1.24)
GFR calc Af Amer: >60 mL/min (ref >60)
GFR calc non Af Amer: >60 mL/min (ref >60)
GLUCOSE: 119 mg/dL — AB (ref 65–99)
Potassium: 4.6 mmol/L (ref 3.5–5.1)
SODIUM: 136 mmol/L (ref 135–145)

## 2014-12-17 ENCOUNTER — Other Ambulatory Visit: Payer: Self-pay | Admitting: Infectious Diseases

## 2014-12-18 ENCOUNTER — Telehealth: Payer: Self-pay | Admitting: *Deleted

## 2014-12-18 NOTE — Telephone Encounter (Signed)
Refill request for Truvada and Tivicay is this appropriate. A genotype was requested. Please respond to nurse triage pool. Wendall Mola

## 2014-12-18 NOTE — Telephone Encounter (Signed)
Please refill tivicay truvada

## 2014-12-21 ENCOUNTER — Telehealth: Payer: Self-pay | Admitting: *Deleted

## 2014-12-21 NOTE — Telephone Encounter (Signed)
RN contacted Shaun Brennan(per Shaun Brennan is the nurse that comes to his home). RN was concerned that if Shaun Brennan was through a home Health agency that they may think I am also through [facility] resulting in them discharging the patient. RN spoke with Shaun Brennan who states she is a paramedic that makes home visits for the Advanced Heart Failure clinic. Shaun Brennan states she does not have any plans on discharging Shaun Brennan and will continue to fill his pillbox weekly or biweekly. RN offered my assistance to Shaun Brennan if she may need some additional help with managing Shaun Brennan. Shaun Brennan thanked me for the offer.

## 2014-12-21 NOTE — Progress Notes (Signed)
Patient ID: Shaun Brennan, male   DOB: [birth year], 39 y.o.   MRN: [id number] RN conducted at visit with the patient at the [facility] for Infectious Disease Clinic. The patient continues to be doing well and feeling well despite his elevated BP. RN questioned the patient on his elevated BP and he states he has taken his AM meds and is not sure why his BP is elevated. RN instructed the patient on what it means to follow a low sodium diet and how a lot of processed, fast foods contain a lot of salt. Patient and I discussed his process for medication management and he stated Katie(nurse that comes to his home) fills his pillbox for him and it is a Advertising copywriter. RN asked the patient if I could contact Katie for coordination of Care. Patient provided Katie's number of [phone]. During today's visit the focus was on Viral Load and CD4 count. Together the patient and I reviewed him most recent Viral load count. Pt was very happy to be undetectable and states he will continue on this path.

## 2014-12-21 NOTE — Patient Instructions (Signed)
Please refer to progress note for education provided during today's visit 

## 2014-12-22 NOTE — Telephone Encounter (Signed)
Thank you for your dedication to the care of our patients, to our community and your commitment to excellence.   

## 2014-12-22 NOTE — Telephone Encounter (Signed)
Triage nurse please take care of this. Thank you. Wendall Mola

## 2014-12-23 ENCOUNTER — Ambulatory Visit: Payer: Self-pay | Admitting: *Deleted

## 2014-12-23 VITALS — BP 165/106 | HR 88 | Temp 97.7°F | Resp 18

## 2014-12-23 DIAGNOSIS — I10 Essential (primary) hypertension: Secondary | ICD-10-CM

## 2014-12-23 DIAGNOSIS — B20 Human immunodeficiency virus [HIV] disease: Secondary | ICD-10-CM

## 2014-12-25 ENCOUNTER — Telehealth (HOSPITAL_COMMUNITY): Payer: Self-pay | Admitting: Cardiology

## 2014-12-25 NOTE — Telephone Encounter (Signed)
Katie left message on triage voicemail during patients visit B/p elevated 160/107 and 170/110 Weight up x 3 lbs since last week visit C/o increased SOB

## 2014-12-25 NOTE — Telephone Encounter (Signed)
Per Mardelle Matte Tillery,PA/Dr.Bensimhon Pt should increase hydralazine to 75 mg (1 and 1/2 tab) BID, imdur 60mg  daily (if pt reports he is taking current meds as prescribed) If it is not taking meds as prescribed but should restart as prescribed  take lasix x next 3 days (if pt reports he is not taking meds as prescribed) Take lasix 80 mg x 2 days (if pt reports he is taking meds as prescribed)

## 2014-12-27 LAB — HIV-1 INTEGRASE GENOTYPE

## 2014-12-29 NOTE — Patient Instructions (Signed)
Please refer to progress note for education provided during today's visit 

## 2014-12-29 NOTE — Progress Notes (Signed)
Patient ID: Shaun Brennan, male   DOB: 05-03-1975, 39 y.o.   MRN: [id number] RN meet with the patient at [facility] facility and continued visit at [facility]. Pt states he has all medications and took his AM medications this morning(which includes BP medications. Pt stated he has not taken afternoon medications RN witnessed the patient taking what he stated is his afternoon medications at this time. RN instructed the patient on ways to limit his sodium intake.  Education to include: Stop using the salt shaker.  Use salt sparingly in cooking and baking.  Substitute with sodium-free seasonings and spices.  Do not use a salt substitute (potassium chloride) without your caregiver's permission.  Include a variety of fresh, unprocessed foods in your diet.  Limit the use of processed and convenience foods that are high in sodium.   Pt verbalized a understanding of education provided, Pt went on to state [name] helps him get all of his medications with the exception of his HIV medications. RN assisted the patient with getting medications from [facility] during today's visit

## 2014-12-30 ENCOUNTER — Telehealth: Payer: Self-pay | Admitting: *Deleted

## 2014-12-30 NOTE — Telephone Encounter (Signed)
RN received a call from the patient stating he needs to cancel our visit for today. Pt states he has all of this medications and continues to take them everyday. RN rescheduled visit for the following week. Purpose of this communication is to relay that that the set frequency for home visits this week has been changed due to a missed visit. Communication has been made with the patient to ensure needs have been met and to offer a home visit. At this time a return call has not been received before the business week is out. Next week the [place] Nurse plans to continue contact with the patient to offer any services or attempt to address any needs the patient expresses that is reasonable.

## 2015-01-05 ENCOUNTER — Telehealth (HOSPITAL_COMMUNITY): Payer: Self-pay | Admitting: *Deleted

## 2015-01-05 NOTE — Telephone Encounter (Signed)
Shaun Brennan with paramedic called to let us know that Shaun Brennan weight is up 6 pounds in a week.  Spoke with DM about it.   DM said for him to take his Lasix 20 mg for 2 days.   Call us if the weight doesn't decrease or feels worse.

## 2015-01-06 ENCOUNTER — Telehealth: Payer: Self-pay | Admitting: *Deleted

## 2015-01-12 ENCOUNTER — Other Ambulatory Visit (HOSPITAL_COMMUNITY): Payer: Self-pay | Admitting: Cardiology

## 2015-01-12 ENCOUNTER — Other Ambulatory Visit (HOSPITAL_COMMUNITY): Payer: Self-pay | Admitting: *Deleted

## 2015-01-12 MED ORDER — HYDRALAZINE HCL 50 MG PO TABS: 75.0000 mg | ORAL_TABLET | Freq: Three times a day (TID) | ORAL | Status: DC

## 2015-01-12 MED ORDER — ISOSORBIDE MONONITRATE ER 60 MG PO TB24: 60.0000 mg | ORAL_TABLET | Freq: Every day | ORAL | Status: DC

## 2015-01-12 MED ORDER — HYDRALAZINE HCL 50 MG PO TABS: 75.0000 mg | ORAL_TABLET | Freq: Two times a day (BID) | ORAL | Status: DC

## 2015-01-15 ENCOUNTER — Telehealth: Payer: Self-pay | Admitting: *Deleted

## 2015-01-15 ENCOUNTER — Ambulatory Visit: Payer: Self-pay | Admitting: *Deleted

## 2015-01-15 DIAGNOSIS — B20 Human immunodeficiency virus [HIV] disease: Secondary | ICD-10-CM

## 2015-01-15 NOTE — Progress Notes (Signed)
Patient ID: Shaun Brennan, male   DOB: 1975-04-14, 39 y.o.   MRN: [id number] RN spent the morning trying to get the pt's medications and was made aware by [employer] that the pt's ADAP is no longer effective. RN contacted the patient and LyDonna([facility] financial assistance). Communication was coordinated for the patient to f/u with LyDonna about his ADAP being placed on HOLD. Pt stated he would rather just meet next week and have me continue to assist him with getting his medication.

## 2015-01-15 NOTE — Telephone Encounter (Signed)
Rn contacted [facility] pharmacy to check on the availability of the patient's medications. RN spoke with Tammy who stated the patient's coverage expired last week. RN contacted the patient to give him the update. Pt stated he received a letter in the mail yesterday stating his ADAP is on HOLD. Pt stated he has enough medication until next week.  RN contacted LyDonna to see if she has any additional resources to help the patient get his medication. She stated she would like to know exactly what the patient's letter states. RN contacted the patient and gave him LyDonna's phone number to discuss his ADAP benefits.

## 2015-01-18 ENCOUNTER — Other Ambulatory Visit: Payer: Self-pay | Admitting: Infectious Disease

## 2015-01-18 MED ORDER — DOLUTEGRAVIR SODIUM 50 MG PO TABS: 50.0000 mg | ORAL_TABLET | Freq: Every day | ORAL | Status: DC

## 2015-01-18 MED ORDER — EMTRICITABINE-TENOFOVIR DF 200-300 MG PO TABS: 1.0000 | ORAL_TABLET | Freq: Every day | ORAL | Status: DC

## 2015-01-20 ENCOUNTER — Ambulatory Visit: Payer: Self-pay | Admitting: *Deleted

## 2015-01-20 ENCOUNTER — Telehealth (HOSPITAL_COMMUNITY): Payer: Self-pay

## 2015-01-20 VITALS — BP 177/103 | HR 77 | Temp 98.1°F | Resp 18

## 2015-01-20 DIAGNOSIS — B20 Human immunodeficiency virus [HIV] disease: Secondary | ICD-10-CM

## 2015-01-20 DIAGNOSIS — I10 Essential (primary) hypertension: Secondary | ICD-10-CM

## 2015-01-20 NOTE — Telephone Encounter (Signed)
Home health RN called and reported patients BP was elevated to 177/103 today  Called patient at home.  He said that is was high today but has been running good.  Monday it was not high.  Patient said that he is trying to watch what he eats but hie has to feed them what they give him at the [facility].  His taking all his medications as prescribed  Asked him to have his blood pressure rechecked on Friday and call us back with the reading

## 2015-01-22 NOTE — Progress Notes (Signed)
Patient ID: Shaun Brennan, male   DOB: 1975-07-12, 39 y.o.   MRN: [id number] RN meet the patient at the [facility] for our appt. Initially I f/u on the pt's medications. Pt renewed his ADAP late and so his medications could not be filled. Pt stated he did f/u with [facility] and they will be sending him a shipment of medications to the [facility](current residence) through [employer]. Pt stated he has enough medication until next week. RN instructed the patient to contact me if he does not get his medications as expected. Pt verbalized a understanding of education provided. RN assessed the pt's vitals and noted his BP is elevated. Since the pt's BP has consistently been elevated for every visit a call was placed to Dr Bensimhon's office with a Voice message left. The voicemail stated the pt's current BP and a Hx of elevated BP despite the patient stating he is taking his medication as ordered.

## 2015-01-22 NOTE — Patient Instructions (Signed)
Please refer to progress note for the details of today's visit 

## 2015-01-26 ENCOUNTER — Telehealth (HOSPITAL_COMMUNITY): Payer: Self-pay

## 2015-01-26 NOTE — Telephone Encounter (Signed)
HHN Katie saw patient on his routine weekly visit and found his weight to be up 3 lbs and his blood pressure tp be 150/106 HR 90  Patient with c/o or symptoms.  Routing to Dr. Gala Romney / Dr. Shirlee Latch for advice

## 2015-01-28 ENCOUNTER — Ambulatory Visit (HOSPITAL_COMMUNITY)
Admission: RE | Admit: 2015-01-28 | Discharge: 2015-01-28 | Disposition: A | Discharge status: Home / Self Care | Payer: Self-pay | Source: Ambulatory Visit | Attending: Internal Medicine | Admitting: Internal Medicine

## 2015-01-28 VITALS — BP 140/100 | HR 75 | Ht 65.0 in | Wt 210.0 lb

## 2015-01-28 DIAGNOSIS — I5022 Chronic systolic (congestive) heart failure: Secondary | ICD-10-CM | POA: Insufficient documentation

## 2015-01-28 DIAGNOSIS — Z79899 Other long term (current) drug therapy: Secondary | ICD-10-CM | POA: Insufficient documentation

## 2015-01-28 DIAGNOSIS — I429 Cardiomyopathy, unspecified: Secondary | ICD-10-CM | POA: Insufficient documentation

## 2015-01-28 DIAGNOSIS — Z590 Homelessness: Secondary | ICD-10-CM | POA: Insufficient documentation

## 2015-01-28 DIAGNOSIS — Z833 Family history of diabetes mellitus: Secondary | ICD-10-CM | POA: Insufficient documentation

## 2015-01-28 DIAGNOSIS — F17200 Nicotine dependence, unspecified, uncomplicated: Secondary | ICD-10-CM

## 2015-01-28 DIAGNOSIS — Z7984 Long term (current) use of oral hypoglycemic drugs: Secondary | ICD-10-CM | POA: Insufficient documentation

## 2015-01-28 DIAGNOSIS — I10 Essential (primary) hypertension: Secondary | ICD-10-CM | POA: Insufficient documentation

## 2015-01-28 DIAGNOSIS — F17210 Nicotine dependence, cigarettes, uncomplicated: Secondary | ICD-10-CM | POA: Insufficient documentation

## 2015-01-28 DIAGNOSIS — F1010 Alcohol abuse, uncomplicated: Secondary | ICD-10-CM | POA: Insufficient documentation

## 2015-01-28 DIAGNOSIS — B20 Human immunodeficiency virus [HIV] disease: Secondary | ICD-10-CM | POA: Insufficient documentation

## 2015-01-28 DIAGNOSIS — E119 Type 2 diabetes mellitus without complications: Secondary | ICD-10-CM | POA: Insufficient documentation

## 2015-01-28 DIAGNOSIS — Z720 Tobacco use: Secondary | ICD-10-CM

## 2015-01-28 MED ORDER — HYDRALAZINE HCL 50 MG PO TABS: 100.0000 mg | ORAL_TABLET | Freq: Two times a day (BID) | ORAL | Status: DC

## 2015-01-28 NOTE — Progress Notes (Signed)
Patient ID: Shaun Brennan, male   DOB: October 10, 1975, 39 y.o.   MRN: [id number]  PCP: Dr. Annitta Needs [facility]) Primary Cardiologist: Dr. Haroldine Laws  HPI: [name] is a 39 yo male with a history of HIV, DM2, HTN, tobacco abuse and chronic systolic heart failure (due to presumed NICM EF 25-30% - no cath). Hospitalized in 4/16 with ADHF because he was off meds while in jail, has since been taking medications regularly with nurse help from HF clinic to stock pill poxes.  Follow up for Heart Failure:  At last visit losartan increased to 100 mg daily. Overall feels ok. Denies SOB/PND/Orthopnea. Lives at [facility]. Ambulates with a cane. Smoking 4 cigarettes per day. Drinks a 6 pack on weekends. Followed by paramedicine.   Labs 03/31/14 K 4.1 Creatinine 1.10  Negative Hep B surface antigen and negative HCV AB; RPR non-reactive.  Labs 08/25/14 K 4.3, Creatinine 1.05 Labs 12/16/2014: K 4.6 Creatinine 1.03   ECHO (12/08/2013): EF 15-20%, grade II DD, mild/mod LA, RV normal ECHO 04/27/2014 EF 25-30% RV mildy HK. Not seen well.  ECHO 11/30/14 preliminary results 40-45%  ROS: All systems negative except as listed in HPI, PMH and Problem List.  SH:  Social History   Social History  . Marital Status: Single    Spouse Name: N/A  . Number of Children: N/A  . Years of Education: N/A   Occupational History  . Not on file.   Social History Main Topics  . Smoking status: Current Every Day Smoker -- 0.25 packs/day    Types: Cigarettes  . Smokeless tobacco: Never Used     Comment: about 10 cigarettes a week  . Alcohol Use: 0.0 oz/week    0 Standard drinks or equivalent per week     Comment: socially about 6 pack per week   . Drug Use: Yes    Special: Marijuana  . Sexual Activity: Yes     Comment: pt. given condoms   Other Topics Concern  . Not on file   Social History Narrative   Currently homeless    FH:  Family History  Problem Relation Age of Onset   . Other Father   . Diabetes Mother   . Stroke Paternal Grandfather     Past Medical History  Diagnosis Date  . HIV (human immunodeficiency virus infection)   . Diabetes mellitus without complication   . Hypertension   . Chronic systolic heart failure     a. EF 15-20%, grade II DD, LA mild/mod dilated  . DM2 (diabetes mellitus, type 2)     Current Outpatient Prescriptions  Medication Sig Dispense Refill  . carvedilol (COREG) 25 MG tablet Take 1 tablet (25 mg total) by mouth 2 (two) times daily with a meal. 60 tablet 3  . dolutegravir (TIVICAY) 50 MG tablet Take 1 tablet (50 mg total) by mouth daily. 30 tablet 6  . emtricitabine-tenofovir (TRUVADA) 200-300 MG tablet Take 1 tablet by mouth daily. 30 tablet 6  . furosemide (LASIX) 40 MG tablet Take only on Mondays and Fridays 12 tablet 2  . glucose monitoring kit (FREESTYLE) monitoring kit 1 each by Does not apply route 4 (four) times daily - after meals and at bedtime. 1 month Diabetic Testing Supplies for QAC-QHS accuchecks. 1 each 1  . hydrALAZINE (APRESOLINE) 50 MG tablet Take 1.5 tablets (75 mg total) by mouth 2 (two) times daily. 160 tablet 3  . isosorbide mononitrate (IMDUR) 60 MG 24 hr tablet Take 1  tablet (60 mg total) by mouth daily. 30 tablet 6  . losartan (COZAAR) 100 MG tablet Take 1 tablet (100 mg total) by mouth daily. 30 tablet 2  . metFORMIN (GLUCOPHAGE) 500 MG tablet Take 1 tablet (500 mg total) by mouth 2 (two) times daily with a meal. 60 tablet 3  . cetirizine (ZYRTEC) 10 MG tablet Take 10 mg by mouth daily.     No current facility-administered medications for this encounter.    Filed Vitals:   01/28/15 1223  BP: 140/100  Pulse: 75  Height: $Remove'5\' 5"'vLkEBsw$  (1.651 m)  Weight: 210 lb (95.255 kg)  SpO2: 99%    PHYSICAL EXAM: General:  Well appearing. NAD HEENT: normal Neck: supple. JVP flat. Carotids 2+ bilaterally; no bruits. No lymphadenopathy or thryomegaly. Cor: PMI normal. Regular rate & rhythm. No rubs, gallops  or murmurs. Lungs: CTA Abdomen: obese, nontender, nondistended. No HSM. No bruits or masses. +BS Extremities: no cyanosis, clubbing, rash, Trace ankle edema bilateral Neuro: alert & orientedx3, cranial nerves grossly intact. Moves all 4 extremities w/o difficulty. Affect pleasant.   ASSESSMENT & PLAN:  1) Chronic systolic HF: NICM?, EF 08-13%  (04/27/2014).  Echo last visit at bedside about 45%.  - NYHA II symptoms. Much improved now that he is taking his meds. Volume status good - Continue lasix 40 mg on Mondays and Fridays.  - Continue carvedilol to 25 bid - Increase hydralazine to 100 mg twice a day. Continue  Imdur 30 mg daily.  -Continue losartan to 100 mg daily. - No Ace due to cough.  - Has no insurance therefore can not get ICD.  - Working with paramedicine - Using pill boxes and has all of his medicines. Had missed several afternoon and evening doses. -2) HTN - Still elevated. As above hydralzine increased.  3) HIV - Was able to get his medicines filled and is currently taking all of them - Follows with ID 4) Social Issues - Homeless. Trying to get disability. - SW following 5) Tobacco abuse: - Reports smoking about 2-3 cigarettes a day. Encouraged to try and abstain completely.  6) Alcohol abuse: - Discussed trying to abstain completely. Still drinking on the weekends.    Follow up in HF clinic in 3 month.   Darrick Grinder, NP-C  12:42 PM

## 2015-01-28 NOTE — Telephone Encounter (Signed)
Take extra dose of lasix.

## 2015-01-28 NOTE — Patient Instructions (Addendum)
Follow up in 2 months   Increase Hydralazine 100 mg (2 tablets) twice a day  Do the following things EVERYDAY: 1. Weigh yourself in the morning before breakfast. Write it down and keep it in a log. 2. Take your medicines as prescribed 3. Eat low salt foods-Limit salt (sodium) to 2000 mg per day.  4. Stay as active as you can everyday 5. Limit all fluids for the day to less than 2 liters

## 2015-01-29 NOTE — Telephone Encounter (Signed)
Mr Shaun Brennan instructed to take an extra dose of Lasix.  Said he would, no problem

## 2015-02-03 ENCOUNTER — Ambulatory Visit: Payer: Self-pay | Admitting: *Deleted

## 2015-02-03 DIAGNOSIS — B20 Human immunodeficiency virus [HIV] disease: Secondary | ICD-10-CM

## 2015-02-08 ENCOUNTER — Ambulatory Visit: Payer: Self-pay | Admitting: Infectious Diseases

## 2015-02-08 ENCOUNTER — Other Ambulatory Visit: Payer: Self-pay | Admitting: Internal Medicine

## 2015-02-08 NOTE — Telephone Encounter (Signed)
Purpose of this communication is to relay that that the set frequency for home visits this week has been changed due to a missed visit. Communication has been made with the patient to ensure needs have been met and to offer a home visit. At this time a return call has not been received before the business week is out. Next week the [facility] Nurse plans to continue contact with the patient to offer any services or attempt to address any needs the patient expresses that is reasonable.  

## 2015-02-08 NOTE — Progress Notes (Signed)
Patient ID: Shaun Brennan, male   DOB: 1976-01-15, 39 y.o.   MRN: [id number] RN spoke with the patient and he stated he will be at [facility] at 11am today and asked me to meet him there.   RN arrived at [facility] but the patient was not there. Without asking another resident stated Sabir, who they call gucci, is still in court. RN did not respond to ensure that the pt's privacy is honored. After waiting for a hour without hearing from the patient or seeing the patient. RN left [facility].

## 2015-02-09 ENCOUNTER — Telehealth: Payer: Self-pay | Admitting: Internal Medicine

## 2015-02-09 DIAGNOSIS — E139 Other specified diabetes mellitus without complications: Secondary | ICD-10-CM

## 2015-02-09 MED ORDER — METFORMIN HCL 500 MG PO TABS: 500.0000 mg | ORAL_TABLET | Freq: Two times a day (BID) | ORAL | Status: DC

## 2015-02-09 NOTE — Telephone Encounter (Signed)
Patient came in requesting a medication refill for Metformin Please follow up with patient.

## 2015-02-10 ENCOUNTER — Telehealth: Payer: Self-pay | Admitting: *Deleted

## 2015-02-17 NOTE — Progress Notes (Signed)
[facility] notified via fax. Howell, Michelle M, RN   

## 2015-02-23 ENCOUNTER — Ambulatory Visit: Payer: Self-pay | Admitting: Family Medicine

## 2015-02-25 ENCOUNTER — Telehealth (HOSPITAL_COMMUNITY): Payer: Self-pay

## 2015-02-25 MED ORDER — ISOSORBIDE MONONITRATE ER 60 MG PO TB24: 60.0000 mg | ORAL_TABLET | Freq: Every day | ORAL | Status: DC

## 2015-02-25 NOTE — Telephone Encounter (Signed)
Purpose of this communication is to relay that that the set frequency for home visits this week has been changed due to a missed visit. Communication has been made with the patient to ensure needs have been met and to offer a home visit. Pt stated [place] and apologized for missing out visit on the 26th.  At this time a return call has not been received before the business week is out. Next week the [place] Nurse plans to continue contact with the patient to offer any services or attempt to address any needs the patient expresses that is reasonable.

## 2015-02-25 NOTE — Telephone Encounter (Signed)
Rx refill per request of Paramedical. Katie. Isorbide

## 2015-03-02 ENCOUNTER — Telehealth: Payer: Self-pay | Admitting: *Deleted

## 2015-03-02 NOTE — Telephone Encounter (Signed)
RN contacted the patient to attempt to schedule a home visit, number continued to drop call after several attempts. Will attempt call at a later time

## 2015-03-08 ENCOUNTER — Other Ambulatory Visit (HOSPITAL_COMMUNITY): Payer: Self-pay | Admitting: *Deleted

## 2015-03-08 ENCOUNTER — Telehealth (HOSPITAL_COMMUNITY): Payer: Self-pay

## 2015-03-08 MED ORDER — ISOSORBIDE MONONITRATE ER 60 MG PO TB24: 60.0000 mg | ORAL_TABLET | Freq: Every day | ORAL | Status: DC

## 2015-03-08 MED ORDER — LOSARTAN POTASSIUM 100 MG PO TABS: 100.0000 mg | ORAL_TABLET | Freq: Every day | ORAL | Status: DC

## 2015-03-08 NOTE — Telephone Encounter (Signed)
RX for Imdur and Losartan sent to [facility]

## 2015-03-09 ENCOUNTER — Telehealth (HOSPITAL_COMMUNITY): Payer: Self-pay

## 2015-03-09 NOTE — Telephone Encounter (Signed)
Shaun Brennan, Paramedical called and said patients weight is up 6 lbs since 11/23.  Patient told [name] that he had quite a bit to eat over [employer]. Takes Lasix on Monday and Friday. Patient is going to take an additional Lasix 40 mg

## 2015-03-25 ENCOUNTER — Telehealth: Payer: Self-pay | Admitting: Internal Medicine

## 2015-03-25 NOTE — Telephone Encounter (Signed)
Pt. Came in requesting a med refill on metformin. Pt. Stated he is out of his medication. Please f/u with pt.

## 2015-03-26 ENCOUNTER — Other Ambulatory Visit: Payer: Self-pay

## 2015-03-26 DIAGNOSIS — E139 Other specified diabetes mellitus without complications: Secondary | ICD-10-CM

## 2015-03-26 MED ORDER — METFORMIN HCL 500 MG PO TABS: 500.0000 mg | ORAL_TABLET | Freq: Two times a day (BID) | ORAL | Status: DC

## 2015-03-26 NOTE — Telephone Encounter (Signed)
Patient called requesting refill of metformin. Nurse called patient, patient verified date of birth. Patient aware of nurse sending metformin with 1 refill to pharmacy. Patient agrees to call back in the beginning of January to schedule appointment, as advised by front office staff. Patient voices understanding and has no further questions at this time.

## 2015-03-30 ENCOUNTER — Encounter (HOSPITAL_COMMUNITY): Payer: Self-pay

## 2015-03-30 ENCOUNTER — Telehealth: Payer: Self-pay

## 2015-03-30 ENCOUNTER — Telehealth: Payer: Self-pay | Admitting: *Deleted

## 2015-03-30 NOTE — Telephone Encounter (Signed)
The intent of this communication is to inform the Health Care Team that this patient will be discharged from [facility]  Greater than 3 attempts have been made to re-engage without any success. I would love to continue with the patient since he has only been undetectable for a short period of time, but have been unable to locate him.  Based on this information the services will be terminated with hopes of reopening to continue our works on medication adherence and HIV disease management. Effective 03/30/15 patient will be discharged and removed from [facility] active patient listing.

## 2015-03-30 NOTE — Telephone Encounter (Signed)
LVM to CB and schedule appt. For Financial Counseling during 04/13/15-05/21/15 to renew ADAP and also needs to make an appt to [facility] was due to come back in October 2016

## 2015-04-13 ENCOUNTER — Other Ambulatory Visit (HOSPITAL_COMMUNITY): Payer: Self-pay | Admitting: *Deleted

## 2015-04-14 ENCOUNTER — Encounter (HOSPITAL_COMMUNITY): Payer: Self-pay

## 2015-04-15 MED FILL — ISOSORBIDE MN ER 60 MG TAB: 60 | 30 days supply | Qty: 30 | Fill #3

## 2015-04-15 MED FILL — LOSARTAN POTASSIUM 100 MG T: 100 | 30 days supply | Qty: 30 | Fill #1

## 2015-04-16 ENCOUNTER — Telehealth (HOSPITAL_COMMUNITY): Payer: Self-pay | Admitting: Cardiology

## 2015-04-16 NOTE — Telephone Encounter (Signed)
Medication clarification given to pharmacy regarding lisinopril that was discontinued in 2015

## 2015-04-20 ENCOUNTER — Ambulatory Visit: Payer: Self-pay

## 2015-04-20 ENCOUNTER — Telehealth: Payer: Self-pay | Admitting: Licensed Clinical Social Worker

## 2015-04-20 NOTE — Telephone Encounter (Signed)
CSW received return call from patient regarding housing. Patient states he has a certificate for housing assistance from [employer] where they will pay rent and utilities while awaiting disability. Patient states he has a possible one bedroom apartment and is in process of completing the application. Patient states he will need $40 for application fee and has a few options for assistance. Patient will return call to CSW with outcome of housing search. CSW will continue to be available as needed for assistance and support. Lasandra Beech, LCSW [phone]

## 2015-04-23 ENCOUNTER — Telehealth (HOSPITAL_COMMUNITY): Payer: Self-pay

## 2015-04-23 NOTE — Telephone Encounter (Signed)
Katie with [facility]F Paramedicine called to report patients BP 140/100, however, he had not taken his medications. Katie advised patient to take all medications as prescribed. Also reports patient's weight in December was 205 lbs, today it is 213lbs. Unsure if this is true weight or fluid as it has been very gradual and patient denies SOB, feels fine, and no swelling noted. Patient has upcoming apt with CHF clinic on 1/19, will assess further at this time. Patient knows to call our clinic is symptoms worsen.  Ave Filter

## 2015-04-27 ENCOUNTER — Ambulatory Visit: Payer: Self-pay | Admitting: Infectious Diseases

## 2015-04-29 ENCOUNTER — Ambulatory Visit (HOSPITAL_COMMUNITY)
Admission: RE | Admit: 2015-04-29 | Discharge: 2015-04-29 | Disposition: A | Discharge status: Home / Self Care | Payer: Self-pay | Source: Ambulatory Visit | Attending: Cardiology | Admitting: Cardiology

## 2015-04-29 VITALS — BP 124/78 | HR 94 | Wt 210.4 lb

## 2015-04-29 DIAGNOSIS — I5022 Chronic systolic (congestive) heart failure: Secondary | ICD-10-CM | POA: Insufficient documentation

## 2015-04-29 DIAGNOSIS — Z7984 Long term (current) use of oral hypoglycemic drugs: Secondary | ICD-10-CM | POA: Insufficient documentation

## 2015-04-29 DIAGNOSIS — I110 Hypertensive heart disease with heart failure: Secondary | ICD-10-CM | POA: Insufficient documentation

## 2015-04-29 DIAGNOSIS — Z21 Asymptomatic human immunodeficiency virus [HIV] infection status: Secondary | ICD-10-CM

## 2015-04-29 DIAGNOSIS — E119 Type 2 diabetes mellitus without complications: Secondary | ICD-10-CM | POA: Insufficient documentation

## 2015-04-29 DIAGNOSIS — F1010 Alcohol abuse, uncomplicated: Secondary | ICD-10-CM | POA: Insufficient documentation

## 2015-04-29 DIAGNOSIS — Z833 Family history of diabetes mellitus: Secondary | ICD-10-CM | POA: Insufficient documentation

## 2015-04-29 DIAGNOSIS — Z590 Homelessness: Secondary | ICD-10-CM | POA: Insufficient documentation

## 2015-04-29 DIAGNOSIS — B20 Human immunodeficiency virus [HIV] disease: Secondary | ICD-10-CM | POA: Insufficient documentation

## 2015-04-29 DIAGNOSIS — Z720 Tobacco use: Secondary | ICD-10-CM

## 2015-04-29 DIAGNOSIS — Z79899 Other long term (current) drug therapy: Secondary | ICD-10-CM | POA: Insufficient documentation

## 2015-04-29 DIAGNOSIS — F17210 Nicotine dependence, cigarettes, uncomplicated: Secondary | ICD-10-CM | POA: Insufficient documentation

## 2015-04-29 DIAGNOSIS — F17200 Nicotine dependence, unspecified, uncomplicated: Secondary | ICD-10-CM

## 2015-04-29 NOTE — Progress Notes (Signed)
Patient ID: Shaun Brennan, male   DOB: 08/25/1975, 40 y.o.   MRN: [id number]   PCP: Dr. Annitta Needs [facility]) Primary Cardiologist: Dr. Haroldine Laws  HPI: Shaun Brennan is a 40 yo male with a history of HIV, DM2, HTN, tobacco abuse and chronic systolic heart failure (due to presumed NICM EF 25-30% - no cath). Hospitalized in 4/16 with ADHF because he was off meds while in jail, has since been taking medications regularly with nurse help from HF clinic to stock pill poxes.  Follow up for Heart Failure:  At last visit hydralalzine was increased. Overall feels ok. Denies SOB/PND/Orthopnea. Lives at [facility]. Ambulates with a cane. Smoking 4 cigarettes per day. Drinks a 6 pack on weekends. Followed by paramedicine weekly to fill pill box.    Labs 03/31/14 K 4.1 Creatinine 1.10  Negative Hep B surface antigen and negative HCV AB; RPR non-reactive.  Labs 08/25/14 K 4.3, Creatinine 1.05 Labs 12/16/2014: K 4.6 Creatinine 1.03   ECHO (12/08/2013): EF 15-20%, grade II DD, mild/mod LA, RV normal ECHO 04/27/2014 EF 25-30% RV mildy HK. Not seen well.  ECHO 11/30/14 preliminary results 40-45%  ROS: All systems negative except as listed in HPI, PMH and Problem List.  SH:  Social History   Social History  . Marital Status: Single    Spouse Name: N/A  . Number of Children: N/A  . Years of Education: N/A   Occupational History  . Not on file.   Social History Main Topics  . Smoking status: Current Every Day Smoker -- 0.25 packs/day    Types: Cigarettes  . Smokeless tobacco: Never Used     Comment: about 10 cigarettes a week  . Alcohol Use: 0.0 oz/week    0 Standard drinks or equivalent per week     Comment: socially about 6 pack per week   . Drug Use: Yes    Special: Marijuana  . Sexual Activity: Yes     Comment: pt. given condoms   Other Topics Concern  . Not on file   Social History Narrative   Currently homeless    FH:  Family History  Problem  Relation Age of Onset  . Other Father   . Diabetes Mother   . Stroke Paternal Grandfather     Past Medical History  Diagnosis Date  . HIV (human immunodeficiency virus infection)   . Diabetes mellitus without complication   . Hypertension   . Chronic systolic heart failure     a. EF 15-20%, grade II DD, LA mild/mod dilated  . DM2 (diabetes mellitus, type 2)     Current Outpatient Prescriptions  Medication Sig Dispense Refill  . carvedilol (COREG) 25 MG tablet Take 1 tablet (25 mg total) by mouth 2 (two) times daily with a meal. 60 tablet 3  . dolutegravir (TIVICAY) 50 MG tablet Take 1 tablet (50 mg total) by mouth daily. 30 tablet 6  . emtricitabine-tenofovir (TRUVADA) 200-300 MG tablet Take 1 tablet by mouth daily. 30 tablet 6  . furosemide (LASIX) 40 MG tablet Take only on Mondays and Fridays 12 tablet 2  . glucose monitoring kit (FREESTYLE) monitoring kit 1 each by Does not apply route 4 (four) times daily - after meals and at bedtime. 1 month Diabetic Testing Supplies for QAC-QHS accuchecks. 1 each 1  . hydrALAZINE (APRESOLINE) 50 MG tablet Take 2 tablets (100 mg total) by mouth 2 (two) times daily. 120 tablet 3  . isosorbide mononitrate (IMDUR) 60 MG 24  hr tablet Take 1 tablet (60 mg total) by mouth daily. 30 tablet 6  . losartan (COZAAR) 100 MG tablet Take 1 tablet (100 mg total) by mouth daily. 30 tablet 6  . metFORMIN (GLUCOPHAGE) 500 MG tablet Take 1 tablet (500 mg total) by mouth 2 (two) times daily with a meal. 60 tablet 1  . cetirizine (ZYRTEC) 10 MG tablet Take 10 mg by mouth daily.     No current facility-administered medications for this encounter.    Filed Vitals:   04/29/15 1431  BP: 124/78  Pulse: 94  Weight: 210 lb 6.4 oz (95.437 kg)  SpO2: 99%    PHYSICAL EXAM: General:  Well appearing. NAD HEENT: normal Neck: supple. JVP flat. Carotids 2+ bilaterally; no bruits. No lymphadenopathy or thryomegaly. Cor: PMI normal. Regular rate & rhythm. No rubs, gallops  or murmurs. Lungs: CTA Abdomen: obese, nontender, nondistended. No HSM. No bruits or masses. +BS Extremities: no cyanosis, clubbing, rash, edema bilateral Neuro: alert & orientedx3, cranial nerves grossly intact. Moves all 4 extremities w/o difficulty. Affect pleasant.   ASSESSMENT & PLAN:  1) Chronic systolic HF: NICM?, EF 41-36%  (04/27/2014).  Echo last visit at bedside about 45%.  - NYHA II symptoms. Much improved now that he is taking his meds. Volume status good - Continue lasix 40 mg on Mondays and Fridays.  - Continue carvedilol to 25 bid -Continue  hydralazine to 100 mg twice a day. Continue  Imdur 30 mg daily.  -Continue losartan to 100 mg daily. - No Ace due to cough. Conisidered entresot but due to social issues with compliance I will continue losartan for now.  - Has no insurance therefore can not get ICD.  - Working with paramedicine - Using pill boxes and has all of his medicines.  -2) HTN - Stable. Continue current regimen.   3) HIV- Was able to get his medicines filled and is currently taking all of them - Follows with ID 4) Social Issues-- Homeless. Trying to get disability.- SW following 5) Tobacco abuse:- Reports smoking about 2-3 cigarettes a day. Encouraged to try and abstain completely.  6) Alcohol abuse:- Discussed trying to abstain completely. Still drinking on the weekends.    Follow up in HF clinic in 3 months. Continue paramedicine. Darrick Grinder, NP-C  2:42 PM

## 2015-04-29 NOTE — Patient Instructions (Signed)
Follow up in 3 months   Do the following things EVERYDAY: 1) Weigh yourself in the morning before breakfast. Write it down and keep it in a log. 2) Take your medicines as prescribed 3) Eat low salt foods-Limit salt (sodium) to 2000 mg per day.  4) Stay as active as you can everyday 5) Limit all fluids for the day to less than 2 liters  

## 2015-05-06 ENCOUNTER — Ambulatory Visit: Payer: Self-pay | Admitting: Family Medicine

## 2015-05-11 MED FILL — hydrALAZINE HCL 50 MG TABS: 50 | 30 days supply | Qty: 120 | Fill #2

## 2015-05-11 MED FILL — metFORMIN HCL 500 MG TABS: 500 | 30 days supply | Qty: 60 | Fill #1

## 2015-05-11 MED FILL — CARVEDILOL 25 MG TABLET: 25 | 30 days supply | Qty: 60 | Fill #1

## 2015-05-12 ENCOUNTER — Telehealth (HOSPITAL_COMMUNITY): Payer: Self-pay

## 2015-05-12 ENCOUNTER — Encounter: Payer: Self-pay | Admitting: Licensed Clinical Social Worker

## 2015-05-12 NOTE — Progress Notes (Signed)
Patient came to see CSW today due to housing issues. Patient reports that he has a section 8 voucher although he has to be out of the [employer] by Friday February 10. Patient tearful and feeling overwhelmed and desperate to find housing. CSW assisted with housing search via [employer] socialserve site. Patient has list and calls made to possible options. Patient and CSW completed application and patient provided financial support for housing application. Patient will drop off application this afternoon and hopeful to hear back within a few business days. Patient appears relieved with assistance from CSW and options provided. CSW will continue to follow and provide support and housing assistance. Lasandra Beech, LCSW [phone]

## 2015-05-12 NOTE — Telephone Encounter (Signed)
Katie, Paramedical called shortly before 5pm 1/31 Reports that patient had missed a couple days of his medication Reports BP 160/110 P 94 Reports that his nail on his toe is hurting.  Told her that he needs to make an appointment with a podiatrist or his primary care provider [name] is making sure he is taking his medications now

## 2015-05-17 MED FILL — LOSARTAN POTASSIUM 100 MG T: 100 | 30 days supply | Qty: 30 | Fill #2

## 2015-05-17 MED FILL — ISOSORBIDE MN ER 60 MG TAB: 60 | 30 days supply | Qty: 30 | Fill #4

## 2015-05-18 ENCOUNTER — Encounter: Payer: Self-pay | Admitting: Licensed Clinical Social Worker

## 2015-05-18 NOTE — Progress Notes (Signed)
CSW met with patient in the clinic. Patient reports that he received a call this morning stating he was approved for the apartment applied for last week. Patient was elated and very grateful for assistance. Patient states he has appointment tomorrow with landlord at [place] to finalize lease and obtain move in date. CSW will continue to be available to assist further with housing assistance (furniture needs). Raquel Sarna, [place]

## 2015-05-24 ENCOUNTER — Telehealth: Payer: Self-pay | Admitting: Licensed Clinical Social Worker

## 2015-05-25 NOTE — Telephone Encounter (Signed)
CSW contacted patient to follow up on housing. Patient reports he is awaiting housing inspection and hopeful to get keys by next week. Patient in good spirits and states he is able to remain in current housing until his apartment is cleared and he is able to move in. CSW confirmed appointment with paramedicine for Thursday and patient verbalized understanding. CSW will continue to be available as needed to further assist with housing needs. Lasandra Beech, LCSW [phone]

## 2015-06-14 ENCOUNTER — Encounter (HOSPITAL_COMMUNITY): Payer: Self-pay | Admitting: Emergency Medicine

## 2015-06-14 ENCOUNTER — Emergency Department (HOSPITAL_COMMUNITY): Payer: Self-pay

## 2015-06-14 ENCOUNTER — Emergency Department (HOSPITAL_COMMUNITY)
Admission: EM | Admit: 2015-06-14 | Discharge: 2015-06-15 | Disposition: A | Discharge status: Home / Self Care | Payer: Self-pay | Attending: Emergency Medicine | Admitting: Emergency Medicine

## 2015-06-14 DIAGNOSIS — Z7984 Long term (current) use of oral hypoglycemic drugs: Secondary | ICD-10-CM | POA: Insufficient documentation

## 2015-06-14 DIAGNOSIS — F17210 Nicotine dependence, cigarettes, uncomplicated: Secondary | ICD-10-CM | POA: Insufficient documentation

## 2015-06-14 DIAGNOSIS — R000 Tachycardia, unspecified: Secondary | ICD-10-CM | POA: Insufficient documentation

## 2015-06-14 DIAGNOSIS — Z79899 Other long term (current) drug therapy: Secondary | ICD-10-CM | POA: Insufficient documentation

## 2015-06-14 DIAGNOSIS — J209 Acute bronchitis, unspecified: Secondary | ICD-10-CM | POA: Insufficient documentation

## 2015-06-14 DIAGNOSIS — I5022 Chronic systolic (congestive) heart failure: Secondary | ICD-10-CM | POA: Insufficient documentation

## 2015-06-14 DIAGNOSIS — J40 Bronchitis, not specified as acute or chronic: Secondary | ICD-10-CM

## 2015-06-14 DIAGNOSIS — Z590 Homelessness: Secondary | ICD-10-CM | POA: Insufficient documentation

## 2015-06-14 DIAGNOSIS — I10 Essential (primary) hypertension: Secondary | ICD-10-CM | POA: Insufficient documentation

## 2015-06-14 DIAGNOSIS — B20 Human immunodeficiency virus [HIV] disease: Secondary | ICD-10-CM | POA: Insufficient documentation

## 2015-06-14 DIAGNOSIS — E119 Type 2 diabetes mellitus without complications: Secondary | ICD-10-CM | POA: Insufficient documentation

## 2015-06-14 NOTE — ED Notes (Signed)
Pt. reports persistent productive cough with chest congestion / tightness onset this week , denies fever or chills.

## 2015-06-14 NOTE — ED Notes (Signed)
Pt c/o cough with yellow sputum. Pt eating bugles upon assessment.

## 2015-06-14 NOTE — ED Provider Notes (Signed)
CSN: [id number]     Arrival date & time 06/14/15  2315 History  By signing my name below, I, Dora Sims, attest that this documentation has been prepared under the direction and in the presence of non-physician practitioner, [facility] M. Janit Bern, NP. Electronically Signed: Dora Sims, Scribe. 06/14/2015. 11:30 PM.    Chief Complaint  Patient presents with  . Cough    Patient is a 40 y.o. male presenting with cough. The history is provided by the patient. No language interpreter was used.  Cough Cough characteristics:  Productive Sputum characteristics:  Yellow Severity:  Unable to specify Onset quality:  Sudden Duration:  3 days Timing:  Constant Progression:  Unable to specify Chronicity:  New Smoker: yes   Relieved by:  None tried Worsened by:  Lying down Associated symptoms: no chills and no fever      HPI Comments: Shaun Brennan is a 40 y.o. male with h/o HIV, HTN, DM2, and CHF who presents to the Emergency Department complaining of constant productive cough with yellow sputum for the last three days. Pt endorses associated chest congestion and chest tightness; he notes that he experiences chest soreness with his cough. Pt is a smoker; he notes that he smokes more marijuana than cigarettes. He reports that he smokes 4 or 5 cigarettes a day. He notes that his cough worsens when he is laying down to go to bed and that it affects his sleep. His PCP is at [place]. Pt used in inhaler years ago for breathing issues but does not use one currently. Pt denies fever, chills, or other associated symptoms.  Patient reports that he is homeless.    Past Medical History  Diagnosis Date  . HIV (human immunodeficiency virus infection) ([place])   . Diabetes mellitus without complication ([place])   . Hypertension   . Chronic systolic heart failure (HCC)     a. EF 15-20%, grade II DD, LA mild/mod dilated  . DM2 (diabetes mellitus, type 2) ([place])    Past Surgical History   Procedure Laterality Date  . Ankle surgery Right    Family History  Problem Relation Age of Onset  . Other Father   . Diabetes Mother   . Stroke Paternal Grandfather    Social History  Substance Use Topics  . Smoking status: Current Every Day Smoker -- 0.25 packs/day    Types: Cigarettes  . Smokeless tobacco: Never Used     Comment: about 10 cigarettes a week  . Alcohol Use: Yes    Review of Systems  Constitutional: Negative for fever and chills.  Respiratory: Positive for cough and chest tightness.   All other systems reviewed and are negative.     Allergies  Lisinopril  Home Medications   Prior to Admission medications   Medication Sig Start Date End Date Taking? Authorizing Provider  azithromycin (ZITHROMAX) 250 MG tablet Take one tablet PO every day until finished. 06/15/15   Anoop Hemmer Bunnie Pion, NP  carvedilol (COREG) 25 MG tablet Take 1 tablet (25 mg total) by mouth 2 (two) times daily with a meal. 11/09/14   Jolaine Artist, MD  cetirizine (ZYRTEC) 10 MG tablet Take 10 mg by mouth daily.    Historical Provider, MD  dolutegravir (TIVICAY) 50 MG tablet Take 1 tablet (50 mg total) by mouth daily. 01/18/15   Truman Hayward, MD  emtricitabine-tenofovir (TRUVADA) 200-300 MG tablet Take 1 tablet by mouth daily. 01/18/15   Truman Hayward, MD  furosemide (LASIX)  40 MG tablet Take only on Mondays and Fridays 03/25/14   Rande Brunt, NP  glucose monitoring kit (FREESTYLE) monitoring kit 1 each by Does not apply route 4 (four) times daily - after meals and at bedtime. 1 month Diabetic Testing Supplies for QAC-QHS accuchecks. 12/12/13   Lorayne Marek, MD  guaiFENesin-codeine (ROBITUSSIN AC) 100-10 MG/5ML syrup Take 5 mLs by mouth 3 (three) times daily as needed for cough. 06/15/15   Eliza Grissinger Bunnie Pion, NP  hydrALAZINE (APRESOLINE) 50 MG tablet Take 2 tablets (100 mg total) by mouth 2 (two) times daily. 01/28/15   [name] D Ninfa Meeker, NP  isosorbide mononitrate (IMDUR) 60 MG 24 hr tablet Take  1 tablet (60 mg total) by mouth daily. 03/08/15   Jolaine Artist, MD  losartan (COZAAR) 100 MG tablet Take 1 tablet (100 mg total) by mouth daily. 03/08/15   Jolaine Artist, MD  metFORMIN (GLUCOPHAGE) 500 MG tablet Take 1 tablet (500 mg total) by mouth 2 (two) times daily with a meal. 03/26/15   Olugbemiga E Jegede, MD   BP 158/90 mmHg  Pulse 90  Temp(Src) 98.1 F (36.7 C) (Oral)  Resp 18  Ht '5\' 5"'$  (1.651 m)  Wt 91.627 kg  BMI 33.61 kg/m2  SpO2 96% Physical Exam  Constitutional: He is oriented to person, place, and time. He appears well-developed and well-nourished. No distress.  HENT:  Head: Normocephalic and atraumatic.  Eyes: Conjunctivae and EOM are normal. Pupils are equal, round, and reactive to light.  Neck: Normal range of motion. Neck supple. No tracheal deviation present.  Cardiovascular: Regular rhythm.  Tachycardia present.   Pulmonary/Chest: Effort normal. No respiratory distress. He has no wheezes. He has no rales.  Abdominal: Soft. There is no tenderness.  Musculoskeletal: Normal range of motion.  Neurological: He is alert and oriented to person, place, and time.  Skin: Skin is warm and dry.  Psychiatric: He has a normal mood and affect. His behavior is normal.  Nursing note and vitals reviewed.   ED Course  Procedures (including critical care time)  DIAGNOSTIC STUDIES: Oxygen Saturation is 97% on RA, normal by my interpretation.    COORDINATION OF CARE: 11:31 PM Will administer Zithromax 500 mg tablet and hydrocodone 72m. Will order DG Chest 2 View. Discussed treatment plan with pt at bedside and pt agreed to plan.   Labs Review Labs Reviewed - No data to display  Imaging Review Dg Chest 2 View  06/14/2015  CLINICAL DATA:  Cough since Saturday.  Chest pain with cough. EXAM: CHEST  2 VIEW COMPARISON:  07/07/2014 FINDINGS: Normal heart size and pulmonary vascularity. Scattered calcified granulomas in the lungs. Calcified right paratracheal lymph nodes.  No focal consolidation or airspace disease. No blunting of costophrenic angles. No pneumothorax. Mediastinal contours appear intact. IMPRESSION: No active cardiopulmonary disease. Electronically Signed   By: WLucienne CapersM.D.   On: 06/14/2015 23:44    MDM  40y.o. male with hx of HIV, DM, HTN and CHF here tonight with productive cough that started a few days ago stable for d/c without respiratory distress, no fever and O2 SAT 97% on R/A. Chest x-ray is normal. Will treat with Zpak and Robitussin AC. Patient given Albuterol Inhaler with instructions on use prior to d/c. Patient to call his PCP tomorrow for follow up. He will return here for worsening symptoms.   Final diagnoses:  Bronchitis   I personally performed the services described in this documentation, which was scribed in my presence. The  recorded information has been reviewed and is accurate.      [address] [place], NP 06/15/15 0761  Everlene Balls, MD 06/15/15 [phone]

## 2015-06-15 MED ORDER — AZITHROMYCIN 250 MG PO TABS
500.0000 mg | ORAL_TABLET | Freq: Once | ORAL | Status: AC
  Administered 2015-06-15: 500 mg via ORAL
  Filled 2015-06-15: qty 2

## 2015-06-15 MED ORDER — HYDROCOD POLST-CPM POLST ER 10-8 MG/5ML PO SUER
5.0000 mL | Freq: Once | ORAL | Status: AC
  Administered 2015-06-15: 5 mL via ORAL
  Filled 2015-06-15: qty 5

## 2015-06-15 MED ORDER — AZITHROMYCIN 250 MG PO TABS: ORAL_TABLET | ORAL | Status: DC

## 2015-06-15 MED ORDER — ALBUTEROL SULFATE HFA 108 (90 BASE) MCG/ACT IN AERS
2.0000 | INHALATION_SPRAY | RESPIRATORY_TRACT | Status: DC | PRN
  Filled 2015-06-15: qty 6.7

## 2015-06-15 MED ORDER — GUAIFENESIN-CODEINE 100-10 MG/5ML PO SYRP: 5.0000 mL | ORAL_SOLUTION | Freq: Three times a day (TID) | ORAL | Status: DC | PRN

## 2015-06-15 NOTE — Discharge Instructions (Signed)
Call your doctor tomorrow for a follow up appointment. Return here as needed for worsening symptoms.

## 2015-07-07 ENCOUNTER — Telehealth: Payer: Self-pay | Admitting: Licensed Clinical Social Worker

## 2015-07-07 NOTE — Telephone Encounter (Signed)
CSW left message for patient to return call regarding assistance with furniture and home needs. CSW will await return call. Lasandra Beech, LCSW [phone]

## 2015-07-08 ENCOUNTER — Other Ambulatory Visit: Payer: Self-pay | Admitting: Internal Medicine

## 2015-07-08 ENCOUNTER — Encounter: Payer: Self-pay | Admitting: Licensed Clinical Social Worker

## 2015-07-08 MED FILL — LOSARTAN POTASSIUM 100 MG T: 100 | 30 days supply | Qty: 30 | Fill #3

## 2015-07-08 MED FILL — hydrALAZINE HCL 50 MG TABS: 50 | 30 days supply | Qty: 120 | Fill #3

## 2015-07-08 MED FILL — CARVEDILOL 25 MG TABLET: 25 | 30 days supply | Qty: 60 | Fill #2

## 2015-07-08 MED FILL — ISOSORBIDE MN ER 60 MG TAB: 60 | 30 days supply | Qty: 30 | Fill #5

## 2015-07-08 NOTE — Progress Notes (Signed)
CSW met with patient in the clinic. Patient reports he is moved in to his new apartment although in need of some furniture. Patient states he has some basic kitchen supplies and a bedroom set. Patient had elevated blood pressure on paramedicine visit earlier today and concerns raised about medication compliance. CSW discussed with patient who denies any concerns and reports he is taking prescribed medications. CSW will refer for assistance with furniture and be available as needed. Raquel Sarna, LCSW [phone]

## 2015-07-13 ENCOUNTER — Other Ambulatory Visit: Payer: Self-pay | Admitting: Internal Medicine

## 2015-07-19 ENCOUNTER — Telehealth: Payer: Self-pay | Admitting: Infectious Diseases

## 2015-07-19 DIAGNOSIS — E139 Other specified diabetes mellitus without complications: Secondary | ICD-10-CM

## 2015-07-19 MED ORDER — METFORMIN HCL 500 MG PO TABS: 500.0000 mg | ORAL_TABLET | Freq: Two times a day (BID) | ORAL | Status: DC

## 2015-07-19 MED FILL — ?METFORMIN HCL 500MG TABLET: 500 | 30 days supply | Qty: 60 | Fill #0

## 2015-07-19 NOTE — Telephone Encounter (Signed)
Patient came in requesting metformin. Please follow up.

## 2015-07-19 NOTE — Telephone Encounter (Signed)
Refilled x 1 month.  Patient must have appointment for any future refills.

## 2015-07-22 ENCOUNTER — Telehealth (HOSPITAL_COMMUNITY): Payer: Self-pay | Admitting: *Deleted

## 2015-07-22 NOTE — Telephone Encounter (Signed)
Please call him and ask him to take an extra lasix today and tomorrow.  Thanks Annai Heick

## 2015-07-22 NOTE — Telephone Encounter (Signed)
Left voice message on [place] phone with detailed instructions.  Called pt did not get an answer or voice mail.

## 2015-07-22 NOTE — Telephone Encounter (Signed)
Shaun Brennan with paramedicine called and stated she was out at patients house and his weight is up 9lbs from last week. Last week his weight was 193lbs this week he is 202lbs. Patient admitted to "over doing it" and "heavy drinking" over the weekend. Please advise.

## 2015-08-03 ENCOUNTER — Telehealth: Payer: Self-pay | Admitting: Licensed Clinical Social Worker

## 2015-08-03 NOTE — Telephone Encounter (Signed)
CSW spoke with patient via phone to follow up on reported high blood pressure and concerns about alcohol use during paramedicine meeting. Patient reports he is doing fine and denies any concerns. Patient states that Katie from paramedicine will be making a visit this afternoon to check his blood pressure. CSW discussed concerns of alcohol use and patient did not deny and stated "only have a little". CSW encouraged patient to reduce alcohol use and call for support when needed. Patient verbalizes understanding. Lasandra Beech, LCSW [phone]

## 2015-08-10 ENCOUNTER — Other Ambulatory Visit (HOSPITAL_COMMUNITY): Payer: Self-pay | Admitting: Adult Health

## 2015-08-11 ENCOUNTER — Other Ambulatory Visit (HOSPITAL_COMMUNITY): Payer: Self-pay | Admitting: *Deleted

## 2015-08-11 MED ORDER — HYDRALAZINE HCL 50 MG PO TABS: 100.0000 mg | ORAL_TABLET | Freq: Two times a day (BID) | ORAL | Status: DC

## 2015-08-19 MED FILL — LOSARTAN POTASSIUM 100 MG T: 100 | 30 days supply | Qty: 30 | Fill #4

## 2015-08-19 MED FILL — FUROSEMIDE 40 MG TABLET: 40 | 34 days supply | Qty: 10 | Fill #0

## 2015-08-19 MED FILL — hydrALAZINE HCL 50 MG TABS: 50 | 30 days supply | Qty: 120 | Fill #0

## 2015-08-23 ENCOUNTER — Other Ambulatory Visit: Payer: Self-pay | Admitting: Internal Medicine

## 2015-08-23 MED FILL — ISOSORBIDE MN ER 60 MG TAB: 60 | 30 days supply | Qty: 30 | Fill #6

## 2015-08-23 MED FILL — CARVEDILOL 25 MG TABLET: 25 | 30 days supply | Qty: 60 | Fill #3

## 2015-09-01 ENCOUNTER — Other Ambulatory Visit: Payer: Self-pay | Admitting: Internal Medicine

## 2015-09-01 ENCOUNTER — Telehealth (HOSPITAL_COMMUNITY): Payer: Self-pay | Admitting: Surgery

## 2015-09-01 MED FILL — ?METFORMIN HCL 500MG TABLET: 500 | 30 days supply | Qty: 60 | Fill #0

## 2015-09-01 NOTE — Telephone Encounter (Signed)
Katie called to make Shaun Brennan an appt.  Patient scheduled for Next Tuesday 30th at 2:30pm.  Patient aware and agreeable.

## 2015-09-02 ENCOUNTER — Telehealth: Payer: Self-pay | Admitting: Licensed Clinical Social Worker

## 2015-09-02 NOTE — Telephone Encounter (Signed)
Paramedic Katie requested CSW to follow up on report from patient that he needs assistance with PCP at [facility]. CSW contacted [facility] and informed that patient has not been seen since 2015 and has missed 2 appointments. Patient will need to call back on 09/08/15 to re-establish care. CSW contacted patient by phone and left message stating the above information. CSW also contacted paramedic to inform of the same. Lasandra Beech, LCSW [phone]

## 2015-09-07 ENCOUNTER — Encounter (HOSPITAL_COMMUNITY): Payer: Self-pay

## 2015-09-08 ENCOUNTER — Encounter: Payer: Self-pay | Admitting: Licensed Clinical Social Worker

## 2015-09-08 ENCOUNTER — Ambulatory Visit (HOSPITAL_COMMUNITY)
Admission: RE | Admit: 2015-09-08 | Discharge: 2015-09-08 | Disposition: A | Discharge status: Home / Self Care | Payer: Self-pay | Source: Ambulatory Visit | Attending: Student | Admitting: Student

## 2015-09-08 VITALS — BP 140/90 | HR 92 | Wt 207.8 lb

## 2015-09-08 DIAGNOSIS — I5022 Chronic systolic (congestive) heart failure: Secondary | ICD-10-CM | POA: Insufficient documentation

## 2015-09-08 DIAGNOSIS — F17200 Nicotine dependence, unspecified, uncomplicated: Secondary | ICD-10-CM

## 2015-09-08 DIAGNOSIS — F1011 Alcohol abuse, in remission: Secondary | ICD-10-CM

## 2015-09-08 DIAGNOSIS — Z7984 Long term (current) use of oral hypoglycemic drugs: Secondary | ICD-10-CM | POA: Insufficient documentation

## 2015-09-08 DIAGNOSIS — I110 Hypertensive heart disease with heart failure: Secondary | ICD-10-CM | POA: Insufficient documentation

## 2015-09-08 DIAGNOSIS — Z833 Family history of diabetes mellitus: Secondary | ICD-10-CM | POA: Insufficient documentation

## 2015-09-08 DIAGNOSIS — Z21 Asymptomatic human immunodeficiency virus [HIV] infection status: Secondary | ICD-10-CM

## 2015-09-08 DIAGNOSIS — E119 Type 2 diabetes mellitus without complications: Secondary | ICD-10-CM | POA: Insufficient documentation

## 2015-09-08 DIAGNOSIS — F17210 Nicotine dependence, cigarettes, uncomplicated: Secondary | ICD-10-CM | POA: Insufficient documentation

## 2015-09-08 DIAGNOSIS — B20 Human immunodeficiency virus [HIV] disease: Secondary | ICD-10-CM | POA: Insufficient documentation

## 2015-09-08 DIAGNOSIS — Z79899 Other long term (current) drug therapy: Secondary | ICD-10-CM | POA: Insufficient documentation

## 2015-09-08 DIAGNOSIS — Z720 Tobacco use: Secondary | ICD-10-CM

## 2015-09-08 DIAGNOSIS — F1010 Alcohol abuse, uncomplicated: Secondary | ICD-10-CM | POA: Insufficient documentation

## 2015-09-08 LAB — BASIC METABOLIC PANEL
Anion gap: 7 (ref 5–15)
BUN: 11 mg/dL (ref 6–20)
CALCIUM: 9.8 mg/dL (ref 8.9–10.3)
CO2: 29 mmol/L (ref 22–32)
Chloride: 95 mmol/L — ABNORMAL LOW (ref 101–111)
Creatinine, Ser: 1.22 mg/dL (ref 0.61–1.24)
Glucose, Bld: 508 mg/dL — ABNORMAL HIGH (ref 65–99)
Potassium: 5.2 mmol/L — ABNORMAL HIGH (ref 3.5–5.1)
Sodium: 131 mmol/L — ABNORMAL LOW (ref 135–145)

## 2015-09-08 MED ORDER — SACUBITRIL-VALSARTAN 24-26 MG PO TABS: 1.0000 | ORAL_TABLET | Freq: Two times a day (BID) | ORAL | Status: DC

## 2015-09-08 MED FILL — ?ENTRESTO 24 MG-26 MG TABLE: 24 MG-26 MG | 30 days supply | Qty: 60 | Fill #0

## 2015-09-08 NOTE — Progress Notes (Signed)
CSW met with patient today in the clinic. Patient states he has not made PCP appointment at [facility] for diabetes follow up. CSW assisted patient and he has an appointment at [facility] for September 24, 2015 at 3pm. Patient was out of some medications and was referred to pharmacy for pick up. Patient reports all is well with his new apartment and denies any other concerns at this time. CSW continues to follow with paramedicine for continuity of care. Raquel Sarna, LCSW [phone]

## 2015-09-08 NOTE — Patient Instructions (Addendum)
Follow up in 10 days for lab work   Follow up for a visit in Heart Failure Clinic 3 months  Start entresto 24-26 mg twice a day  Do the following things EVERYDAY: 1) Weigh yourself in the morning before breakfast. Write it down and keep it in a log. 2) Take your medicines as prescribed 3) Eat low salt foods-Limit salt (sodium) to 2000 mg per day.  4) Stay as active as you can everyday 5) Limit all fluids for the day to less than 2 liters

## 2015-09-08 NOTE — Progress Notes (Signed)
Patient ID: Shaun Brennan, male   DOB: Dec 06, 1975, 40 y.o.   MRN: [id number]   PCP: Dr. Annitta Needs [facility]) Primary Cardiologist: Dr. Haroldine Laws  HPI: Shaun Brennan is a 40 yo male with a history of HIV, DM2, HTN, tobacco abuse and chronic systolic heart failure (due to presumed NICM EF 25-30% - no cath). Hospitalized in 4/16 with [facility] because he was off meds while in jail, has since been taking medications regularly with nurse help from HF clinic to stock pill poxes.  Follow up for Heart Failure:   Overall feeling ok. Able to walk a couple of blocks. Denies SOB/PND/Orthopnea. Now living in his own apartment. Weight at home 205 pounds. Ambulates with a cane. Smoking 4-6 cigarettes per day. Drinks a 12 pack on weekends. Followed by paramedicine.     Labs 03/31/14 K 4.1 Creatinine 1.10  Negative Hep B surface antigen and negative HCV AB; RPR non-reactive.  Labs 08/25/14 K 4.3, Creatinine 1.05 Labs 12/16/2014: K 4.6 Creatinine 1.03   ECHO (12/08/2013): EF 15-20%, grade II DD, mild/mod LA, RV normal ECHO 04/27/2014 EF 25-30% RV mildy HK. Not seen well.  ECHO 11/30/14 preliminary results 40-45%  ROS: All systems negative except as listed in HPI, PMH and Problem List.  SH:  Social History   Social History  . Marital Status: Single    Spouse Name: N/A  . Number of Children: N/A  . Years of Education: N/A   Occupational History  . Not on file.   Social History Main Topics  . Smoking status: Current Every Day Smoker -- 0.25 packs/day    Types: Cigarettes  . Smokeless tobacco: Never Used     Comment: about 10 cigarettes a week  . Alcohol Use: Yes  . Drug Use: Yes    Special: Marijuana  . Sexual Activity: Not on file     Comment: pt. given condoms   Other Topics Concern  . Not on file   Social History Narrative   Currently homeless    FH:  Family History  Problem Relation Age of Onset  . Other Father   . Diabetes Mother   . Stroke Paternal Grandfather      Past Medical History  Diagnosis Date  . HIV (human immunodeficiency virus infection) ([place])   . Diabetes mellitus without complication ([place])   . Hypertension   . Chronic systolic heart failure (HCC)     a. EF 15-20%, grade II DD, LA mild/mod dilated  . DM2 (diabetes mellitus, type 2) ([place])     Current Outpatient Prescriptions  Medication Sig Dispense Refill  . carvedilol (COREG) 25 MG tablet Take 1 tablet (25 mg total) by mouth 2 (two) times daily with a meal. 60 tablet 3  . dolutegravir (TIVICAY) 50 MG tablet Take 1 tablet (50 mg total) by mouth daily. 30 tablet 6  . emtricitabine-tenofovir (TRUVADA) 200-300 MG tablet Take 1 tablet by mouth daily. 30 tablet 6  . furosemide (LASIX) 40 MG tablet TAKE 1 TABLET BY MOUTH ON MONDAYS & FRIDAYS 10 tablet PRN  . glucose monitoring kit (FREESTYLE) monitoring kit 1 each by Does not apply route 4 (four) times daily - after meals and at bedtime. 1 month Diabetic Testing Supplies for QAC-QHS accuchecks. 1 each 1  . hydrALAZINE (APRESOLINE) 50 MG tablet Take 2 tablets (100 mg total) by mouth 2 (two) times daily. 120 tablet 3  . isosorbide mononitrate (IMDUR) 60 MG 24 hr tablet Take 1 tablet (60 mg total) by mouth daily.  30 tablet 6  . losartan (COZAAR) 100 MG tablet Take 1 tablet (100 mg total) by mouth daily. 30 tablet 6  . metFORMIN (GLUCOPHAGE) 500 MG tablet Take 1 tablet (500 mg total) by mouth 2 (two) times daily with a meal. Must have OV for refills (Patient not taking: Reported on 09/08/2015) 60 tablet 0   No current facility-administered medications for this encounter.    Filed Vitals:   09/08/15 1459  BP: 140/90  Pulse: 92  Weight: 207 lb 12.8 oz (94.257 kg)  SpO2: 97%    PHYSICAL EXAM: General:  Well appearing. NAD. Ambulated in the clinic with a cane.  HEENT: normal Neck: supple. JVP flat. Carotids 2+ bilaterally; no bruits. No lymphadenopathy or thryomegaly. Cor: PMI normal. Regular rate & rhythm. No rubs, gallops or  murmurs. Lungs: CTA Abdomen: obese, nontender, nondistended. No HSM. No bruits or masses. +BS Extremities: no cyanosis, clubbing, rash, edema bilateral Neuro: alert & orientedx3, cranial nerves grossly intact. Moves all 4 extremities w/o difficulty. Affect pleasant.   ASSESSMENT & PLAN:  1) Chronic systolic HF: NICM?, EF 06/938 30-35%.  - NYHA II symptoms. Much improved now that he is taking his meds. Volume status good - Continue lasix 40 mg on Mondays and Fridays.  - Continue carvedilol to 25 bid -Continue  hydralazine to 100 mg twice a day. Continue  Imdur 30 mg daily.  -Has been off losartan. Today I will add entresto 24-26 mg twice a day. BMET today and in 10 days.  - Has no insurance therefore can not get ICD.  - Working with paramedicine - Using pill boxes and has all of his medicines.  -2) HTN - Elevated but has been out of losartan. Add entresto  24-26 mg twice a day.   3) HIV- Was able to get his medicines filled and is currently taking all of them - Has follow up with ID next week. Follows with ID 4) Social Issues-- HF SW following. SW following 5) Tobacco abuse:- Counseled on smoking cessation.   6) Alcohol abuse:- Seems to be drinking a little more. Discussed trying to abstain completely. Still drinking on the weekends    Follow up in HF clinic in 3 months. Continue paramedicine.  Today he met with HF SW and Paramedic.   Darrick Grinder, NP-C  3:14 PM

## 2015-09-09 ENCOUNTER — Telehealth (HOSPITAL_COMMUNITY): Payer: Self-pay | Admitting: *Deleted

## 2015-09-09 NOTE — Telephone Encounter (Signed)
Notes Recorded by Modesta Messing, CMA on 09/09/2015 at 2:22 PM Left vm for patient to call back for results. Also called Katie with paramedicine and left her a detailed message. Notes Recorded by Sherald Hess, NP on 09/09/2015 at 1:05 PM Please call him. Needs appointment with PCP ASAP for glucose 509. Needs repeat BMET . Make sure he is not taking losartan. He was started on entresto yesterday

## 2015-09-15 NOTE — Telephone Encounter (Signed)
Notes Recorded by Modesta Messing, CMA on 09/15/2015 at 4:52 PM Katie with paramedicine was in the clinic today and she stated she has not been able to get in touch with patient either. [name] is supposed to see patient this week. We printed a copy of the lab results so that she can make changes and get him to have his labs drawn again. Notes Recorded by Noralee Space, RN on 09/15/2015 at 11:04 AM Still unable to reach pt, Katie w/paramedicine has also been trying to reach pt and been unable, she states she did know last week he had been out of Metformin for while and it was refilled last week. She will continue to try and reach pt Notes Recorded by Modesta Messing, CMA on 09/09/2015 at 2:22 PM Left vm for patient to call back for results. Also called Katie with paramedicine and left her a detailed message. Notes Recorded by Sherald Hess, NP on 09/09/2015 at 1:05 PM Please call him. Needs appointment with PCP ASAP for glucose 509. Needs repeat BMET . Make sure he is not taking losartan. He was started on entresto yesterday. Needs repeat BMET today and next week.       Ref Range 7d ago (09/08/15) 62mo ago (12/16/14) 63mo ago (11/30/14) 53mo ago (10/05/14) 36yr ago (08/25/14)   Sodium 135 - 145 mmol/L 131 (L) 136 135 132 (L) 134 (L)R   Potassium 3.5 - 5.1 mmol/L 5.2 (H) 4.6 4.3 3.8 4.3R   Chloride 101 - 111 mmol/L 95 (L) 97 (L) 98 (L) 97 (L) 95 (L)R   CO2 22 - 32 mmol/L 29 31 28 26  25R   Glucose, Bld 65 - 99 mg/dL 622 (H) 633 (H) 354 (H) 195 (H) 254 (H)R   BUN 6 - 20 mg/dL 11 9 15 15  19R   Creatinine, Ser 0.61 - 1.24 mg/dL 5.62 5.63 8.93 7.34 2.87G   Calcium 8.9 - 10.3 mg/dL 9.8 9.8 9.9 9.0 8.1L   GFR calc non Af Amer >60 mL/min >60 60 mL/min" class="rz_1r" style="cursor: pointer;" onmouseover='jscript: var varStyle="underline"; var bgColor="#DCD7CE"; this.style.backgroundColor=bgColor; var children=this.getElementsByTagName("div"); for(var child=0;child >60 60 mL/min" class="rz_1r" style="cursor: pointer;"  onmouseover='jscript: var varStyle="underline"; var bgColor="#DCD7CE"; this.style.backgroundColor=bgColor; var children=this.getElementsByTagName("div"); for(var child=0;child >60 60 mL/min" class="rz_1r" style="cursor: pointer;" onmouseover='jscript: var varStyle="underline"; var bgColor="#DCD7CE"; this.style.backgroundColor=bgColor; var children=this.getElementsByTagName("div"); for(var child=0;child >60    GFR calc Af Amer >60 mL/min >60 60 mL/min" class="rz_1r" style="cursor: pointer;" onmouseover='jscript: var varStyle="underline"; var bgColor="#DCD7CE"; this.style.backgroundColor=bgColor; var children=this.getElementsByTagName("div"); for(var child=0;child >60CM 60 mL/min" class="rz_1r" style="cursor: pointer;" onmouseover='jscript: var varStyle="underline"; var bgColor="#DCD7CE"; this.style.backgroundColor=bgColor; var children=this.getElementsByTagName("div"); for(var child=0;child >60CM 60 mL/min" class="rz_1r" style="cursor: pointer;" onmouseover='jscript: var varStyle="underline"; var bgColor="#DCD7CE"; this.style.backgroundColor=bgColor; var children=this.getElementsByTagName("div"); for(var child=0;child >60CM

## 2015-09-21 ENCOUNTER — Ambulatory Visit (INDEPENDENT_AMBULATORY_CARE_PROVIDER_SITE_OTHER): Payer: Self-pay | Admitting: Infectious Diseases

## 2015-09-21 ENCOUNTER — Encounter (HOSPITAL_COMMUNITY): Payer: Self-pay | Admitting: Pharmacist

## 2015-09-21 ENCOUNTER — Other Ambulatory Visit (HOSPITAL_COMMUNITY): Payer: Self-pay

## 2015-09-21 ENCOUNTER — Encounter: Payer: Self-pay | Admitting: Infectious Diseases

## 2015-09-21 ENCOUNTER — Telehealth: Payer: Self-pay

## 2015-09-21 ENCOUNTER — Other Ambulatory Visit (HOSPITAL_COMMUNITY): Payer: Self-pay | Admitting: Cardiology

## 2015-09-21 VITALS — BP 129/82 | HR 87 | Temp 97.7°F | Ht 65.0 in | Wt 207.0 lb

## 2015-09-21 DIAGNOSIS — I5022 Chronic systolic (congestive) heart failure: Secondary | ICD-10-CM

## 2015-09-21 DIAGNOSIS — Z113 Encounter for screening for infections with a predominantly sexual mode of transmission: Secondary | ICD-10-CM

## 2015-09-21 DIAGNOSIS — E1169 Type 2 diabetes mellitus with other specified complication: Secondary | ICD-10-CM

## 2015-09-21 DIAGNOSIS — Z21 Asymptomatic human immunodeficiency virus [HIV] infection status: Secondary | ICD-10-CM

## 2015-09-21 DIAGNOSIS — Z79899 Other long term (current) drug therapy: Secondary | ICD-10-CM

## 2015-09-21 DIAGNOSIS — B20 Human immunodeficiency virus [HIV] disease: Secondary | ICD-10-CM

## 2015-09-21 MED ORDER — SACUBITRIL-VALSARTAN 24-26 MG PO TABS: 1.0000 | ORAL_TABLET | Freq: Two times a day (BID) | ORAL | Status: DC

## 2015-09-21 MED ORDER — EMTRICITABINE-TENOFOVIR AF 200-25 MG PO TABS: 1.0000 | ORAL_TABLET | Freq: Every day | ORAL | Status: DC

## 2015-09-21 NOTE — Assessment & Plan Note (Signed)
Will get labs today.  Needs dental.  Will change his travuda to descovy.  Will see him back in 6 months.  Has condoms

## 2015-09-21 NOTE — Assessment & Plan Note (Signed)
Has f/u at [facility].  Needs ophtho, needs podiatry (for toe nails, he has no sores).

## 2015-09-21 NOTE — Telephone Encounter (Signed)
Patient given information to update [employer]. Patient's present card expired 05/28/15. Will bring new card back once approved again in order to have opthalmology and podiatry referral completed. Rejeana Brock, LPN

## 2015-09-21 NOTE — Progress Notes (Signed)
   Subjective:    Patient ID: Shaun Brennan, male    DOB: [birth year], 40 y.o.   MRN: [id number]  HPI 40 y.o. male with history of HIV diagnosed approximately 10 years ago not been on any medication (previoulsy followed in [place]), diabetes mellitus, hypertension, history of CHF (30-35% NYHA II). He was started on DTGV/TRV.   HIV 1 RNA QUANT (copies/mL)  Date Value  12/09/2014 <20  08/25/2014 13743*  07/07/2014 60   CD4 T CELL ABS (/uL)  Date Value  08/25/2014 490  12/06/2013 270*   Has been feeling well. Has been moving around well. DOE with "long walk".  FSG have been ? He does not have glucometer. Has f/u at [facility]. Hg A1C was 8.5% (08-31-14) . Needs ophtho.   Review of Systems  Constitutional: Negative for appetite change and unexpected weight change.  Respiratory: Negative for shortness of breath.   Cardiovascular: Negative for chest pain and leg swelling.  Neurological: Positive for numbness. Negative for headaches.  wt has been steady within 5-6 #.      Objective:   Physical Exam  Constitutional: He appears well-developed and well-nourished.  HENT:  Mouth/Throat: No oropharyngeal exudate.  Eyes: EOM are normal. Pupils are equal, round, and reactive to light.  Neck: Neck supple.  Cardiovascular: Normal rate, regular rhythm and normal heart sounds.   Pulmonary/Chest: Effort normal and breath sounds normal.  Abdominal: Soft. Bowel sounds are normal. There is no tenderness. There is no rebound.  Musculoskeletal: He exhibits no edema.  Lymphadenopathy:    He has no cervical adenopathy.       Assessment & Plan:

## 2015-09-21 NOTE — Assessment & Plan Note (Signed)
Appreciate f/u in CHF clinic.

## 2015-09-22 ENCOUNTER — Telehealth (HOSPITAL_COMMUNITY): Payer: Self-pay | Admitting: Pharmacist

## 2015-09-22 LAB — T-HELPER CELL (CD4) - (RCID CLINIC ONLY)
CD4 % Helper T Cell: 38 % (ref 33–55)
CD4 T Cell Abs: 420 /uL (ref 400–2700)

## 2015-09-22 LAB — CBC
HCT: 41.2 % (ref 38.5–50.0)
Hemoglobin: 13.8 g/dL (ref 13.2–17.1)
MCH: 33.9 pg — AB (ref 27.0–33.0)
MCHC: 33.5 g/dL (ref 32.0–36.0)
MCV: 101.2 fL — ABNORMAL HIGH (ref 80.0–100.0)
MPV: 10.9 fL (ref 7.5–12.5)
PLATELETS: 155 10*3/uL (ref 140–400)
RBC: 4.07 MIL/uL — ABNORMAL LOW (ref 4.20–5.80)
RDW: 14.5 % (ref 11.0–15.0)
WBC: 3.9 10*3/uL (ref 3.8–10.8)

## 2015-09-22 LAB — COMPREHENSIVE METABOLIC PANEL
ALBUMIN: 4.2 g/dL (ref 3.6–5.1)
ALK PHOS: 86 U/L (ref 40–115)
ALT: 41 U/L (ref 9–46)
AST: 32 U/L (ref 10–40)
BILIRUBIN TOTAL: 0.3 mg/dL (ref 0.2–1.2)
BUN: 15 mg/dL (ref 7–25)
CO2: 27 mmol/L (ref 20–31)
Calcium: 9.2 mg/dL (ref 8.6–10.3)
Chloride: 96 mmol/L — ABNORMAL LOW (ref 98–110)
Creat: 1.29 mg/dL (ref 0.60–1.35)
Glucose, Bld: 306 mg/dL — ABNORMAL HIGH (ref 65–99)
Potassium: 4.7 mmol/L (ref 3.5–5.3)
Sodium: 131 mmol/L — ABNORMAL LOW (ref 135–146)
TOTAL PROTEIN: 7.5 g/dL (ref 6.1–8.1)

## 2015-09-22 LAB — LIPID PANEL
CHOLESTEROL: 201 mg/dL — AB (ref 125–200)
HDL: 64 mg/dL (ref >=40)
LDL Cholesterol: 116 mg/dL (ref <130)
TRIGLYCERIDES: 104 mg/dL (ref <150)
Total CHOL/HDL Ratio: 3.1 Ratio (ref <=5.0)
VLDL: 21 mg/dL (ref <30)

## 2015-09-22 LAB — HEMOGLOBIN A1C
Hgb A1c MFr Bld: 9.6 % — ABNORMAL HIGH (ref <5.7)
Mean Plasma Glucose: 229 mg/dL

## 2015-09-22 NOTE — Telephone Encounter (Signed)
[employer] patient assistance approved for Entresto 24-26 mg BID through 09/19/16 at no cost to patient.   Tyler Deis. Bonnye Fava, PharmD, BCPS, CPP Clinical Pharmacist Pager: [phone] Phone: [phone] 09/22/2015 10:21 AM

## 2015-09-23 LAB — HIV-1 RNA QUANT-NO REFLEX-BLD
HIV 1 RNA Quant: <20 copies/mL (ref <20)
HIV-1 RNA Quant, Log: <1.3 Log copies/mL (ref <1.30)

## 2015-09-23 LAB — RPR

## 2015-09-24 ENCOUNTER — Ambulatory Visit: Payer: Self-pay | Admitting: Family Medicine

## 2015-10-04 ENCOUNTER — Other Ambulatory Visit (HOSPITAL_COMMUNITY): Payer: Self-pay | Admitting: Internal Medicine

## 2015-10-04 MED FILL — ISOSORBIDE MN ER 60 MG TAB: 60 | 30 days supply | Qty: 30 | Fill #0

## 2015-10-04 MED FILL — FUROSEMIDE 40 MG TABLET: 40 | 34 days supply | Qty: 10 | Fill #1

## 2015-10-04 MED FILL — hydrALAZINE HCL 50 MG TABS: 50 | 30 days supply | Qty: 120 | Fill #1

## 2015-10-05 ENCOUNTER — Other Ambulatory Visit: Payer: Self-pay | Admitting: Infectious Diseases

## 2015-10-05 ENCOUNTER — Other Ambulatory Visit (HOSPITAL_COMMUNITY): Payer: Self-pay | Admitting: Pharmacist

## 2015-10-05 DIAGNOSIS — I5022 Chronic systolic (congestive) heart failure: Secondary | ICD-10-CM

## 2015-10-05 DIAGNOSIS — B20 Human immunodeficiency virus [HIV] disease: Secondary | ICD-10-CM

## 2015-10-05 MED ORDER — DOLUTEGRAVIR SODIUM 50 MG PO TABS: 50.0000 mg | ORAL_TABLET | Freq: Every day | ORAL | Status: DC

## 2015-10-05 MED ORDER — CARVEDILOL 25 MG PO TABS: 25.0000 mg | ORAL_TABLET | Freq: Two times a day (BID) | ORAL | Status: DC

## 2015-10-05 MED ORDER — EMTRICITABINE-TENOFOVIR AF 200-25 MG PO TABS: 1.0000 | ORAL_TABLET | Freq: Every day | ORAL | Status: DC

## 2015-10-05 MED FILL — CARVEDILOL 25 MG TABLET: 25 | 30 days supply | Qty: 60 | Fill #0

## 2015-10-05 NOTE — Telephone Encounter (Signed)
Patient called triage while at [facility] stating that [facility] did not have prescriptions for Tivicay and Descovy. Verified with pharmacist that patient regimen is Tivicay and Descovy. Called prescription to [place] at [facility] on [address]. Rejeana Brock, LPN

## 2015-10-28 ENCOUNTER — Encounter: Payer: Self-pay | Admitting: Infectious Diseases

## 2015-10-28 ENCOUNTER — Other Ambulatory Visit: Payer: Self-pay | Admitting: Internal Medicine

## 2015-10-28 NOTE — Telephone Encounter (Signed)
Refill Metformin.

## 2015-11-09 MED FILL — ISOSORBIDE MN ER 60 MG TAB: 60 | 30 days supply | Qty: 30 | Fill #1

## 2015-11-09 MED FILL — hydrALAZINE HCL 50 MG TABS: 50 | 30 days supply | Qty: 120 | Fill #2

## 2015-11-09 MED FILL — FUROSEMIDE 40 MG TABLET: 40 | 34 days supply | Qty: 10 | Fill #2

## 2015-11-09 MED FILL — CARVEDILOL 25 MG TABLET: 25 | 30 days supply | Qty: 60 | Fill #1

## 2015-11-10 ENCOUNTER — Ambulatory Visit: Payer: Self-pay | Attending: Family Medicine | Admitting: Family Medicine

## 2015-11-10 ENCOUNTER — Encounter: Payer: Self-pay | Admitting: Family Medicine

## 2015-11-10 VITALS — BP 138/78 | HR 87 | Temp 98.1°F | Ht 65.0 in | Wt 209.2 lb

## 2015-11-10 DIAGNOSIS — I5022 Chronic systolic (congestive) heart failure: Secondary | ICD-10-CM

## 2015-11-10 DIAGNOSIS — E785 Hyperlipidemia, unspecified: Secondary | ICD-10-CM | POA: Insufficient documentation

## 2015-11-10 DIAGNOSIS — B20 Human immunodeficiency virus [HIV] disease: Secondary | ICD-10-CM | POA: Insufficient documentation

## 2015-11-10 DIAGNOSIS — Z21 Asymptomatic human immunodeficiency virus [HIV] infection status: Secondary | ICD-10-CM

## 2015-11-10 DIAGNOSIS — Z79899 Other long term (current) drug therapy: Secondary | ICD-10-CM | POA: Insufficient documentation

## 2015-11-10 DIAGNOSIS — Z7984 Long term (current) use of oral hypoglycemic drugs: Secondary | ICD-10-CM | POA: Insufficient documentation

## 2015-11-10 DIAGNOSIS — E1169 Type 2 diabetes mellitus with other specified complication: Secondary | ICD-10-CM | POA: Insufficient documentation

## 2015-11-10 DIAGNOSIS — E1149 Type 2 diabetes mellitus with other diabetic neurological complication: Secondary | ICD-10-CM

## 2015-11-10 DIAGNOSIS — I509 Heart failure, unspecified: Secondary | ICD-10-CM | POA: Insufficient documentation

## 2015-11-10 DIAGNOSIS — Q775 Diastrophic dysplasia: Secondary | ICD-10-CM | POA: Insufficient documentation

## 2015-11-10 DIAGNOSIS — E1140 Type 2 diabetes mellitus with diabetic neuropathy, unspecified: Secondary | ICD-10-CM | POA: Insufficient documentation

## 2015-11-10 DIAGNOSIS — I10 Essential (primary) hypertension: Secondary | ICD-10-CM | POA: Insufficient documentation

## 2015-11-10 DIAGNOSIS — L603 Nail dystrophy: Secondary | ICD-10-CM

## 2015-11-10 LAB — GLUCOSE, POCT (MANUAL RESULT ENTRY): POC Glucose: 436 mg/dl — AB (ref 70–99)

## 2015-11-10 MED ORDER — GLUCOSE BLOOD VI STRP: ORAL_STRIP | 12 refills | Status: DC

## 2015-11-10 MED ORDER — TRUEPLUS LANCETS 28G MISC: 1.0000 | Freq: Three times a day (TID) | 12 refills | Status: DC

## 2015-11-10 MED ORDER — TRUE METRIX METER DEVI: 1.0000 | Freq: Three times a day (TID) | 0 refills | Status: DC

## 2015-11-10 MED ORDER — METFORMIN HCL 500 MG PO TABS: 1000.0000 mg | ORAL_TABLET | Freq: Two times a day (BID) | ORAL | 3 refills | Status: DC

## 2015-11-10 MED ORDER — ATORVASTATIN CALCIUM 20 MG PO TABS: 20.0000 mg | ORAL_TABLET | Freq: Every day | ORAL | 3 refills | Status: DC

## 2015-11-10 MED FILL — TRUE METRIX TEST STRIP: 30 days supply | Qty: 100 | Fill #0

## 2015-11-10 MED FILL — metFORMIN HCL 500 MG TABS: 500 | 30 days supply | Qty: 120 | Fill #0

## 2015-11-10 MED FILL — !TRUE METRIX BLOOD GLUCOSE: 1 days supply | Qty: 1 | Fill #0

## 2015-11-10 MED FILL — ATORVASTATIN 20 MG TABLET: 20 | 30 days supply | Qty: 30 | Fill #0

## 2015-11-10 NOTE — Progress Notes (Signed)
Subjective:  Patient ID: Shaun Brennan, male    DOB: 20-Aug-1975  Age: 40 y.o. MRN: [id number]  CC: Diabetes and Hypertension   HPI Shaun Brennan is a 40 year old male with a history of CHF (EF 30-35% from 2-D echo of 11/2014), type 2 diabetes mellitus (A1c 9.6 from 09/2015), hypertension, hyperlipidemia, HIV who comes into the clinic to establish care with me.  He is currently on antiretrovirals therapy and is closely followed by infectious disease with his last visit in 09/2015.  Also followed by cardiology and denies any shortness of breath, chest pains or pedal edema.  Has been out of metformin for the last 2-3 weeks and does not have a glucometer to check his blood sugars. Does have some diabetic neuropathy in his feet ; He denies hypoglycemic episodes. Not up-to-date on annual eye exams.  Complains of pain at the site of recently extracted teeth.  Past Medical History:  Diagnosis Date  . Chronic systolic heart failure (HCC)    a. EF 15-20%, grade II DD, LA mild/mod dilated  . Diabetes mellitus without complication ([place])   . DM2 (diabetes mellitus, type 2) ([place])   . HIV (human immunodeficiency virus infection) ([place])   . Hypertension     Past Surgical History:  Procedure Laterality Date  . ANKLE SURGERY Right     Allergies  Allergen Reactions  . Lisinopril Cough     Outpatient Medications Prior to Visit  Medication Sig Dispense Refill  . carvedilol (COREG) 25 MG tablet TAKE 1 TABLET BY MOUTH 2 TIMES DAILY WITH A MEAL. 60 tablet 3  . dolutegravir (TIVICAY) 50 MG tablet Take 1 tablet (50 mg total) by mouth daily. 30 tablet 6  . emtricitabine-tenofovir AF (DESCOVY) 200-25 MG tablet Take 1 tablet by mouth daily. 90 tablet 3  . furosemide (LASIX) 40 MG tablet TAKE 1 TABLET BY MOUTH ON MONDAYS & FRIDAYS 10 tablet PRN  . hydrALAZINE (APRESOLINE) 50 MG tablet Take 2 tablets (100 mg total) by mouth 2 (two) times daily. 120 tablet 3  . isosorbide mononitrate (IMDUR) 60 MG 24  hr tablet Take 1 tablet (60 mg total) by mouth daily. 30 tablet 6  . losartan (COZAAR) 100 MG tablet Take 1 tablet (100 mg total) by mouth daily. 30 tablet 6  . sacubitril-valsartan (ENTRESTO) 24-26 MG Take 1 tablet by mouth 2 (two) times daily. 60 tablet 11  . metFORMIN (GLUCOPHAGE) 500 MG tablet Take 1 tablet (500 mg total) by mouth 2 (two) times daily with a meal. Must have OV for refills 60 tablet 0  . glucose monitoring kit (FREESTYLE) monitoring kit 1 each by Does not apply route 4 (four) times daily - after meals and at bedtime. 1 month Diabetic Testing Supplies for QAC-QHS accuchecks. (Patient not taking: Reported on 11/10/2015) 1 each 1  . carvedilol (COREG) 25 MG tablet Take 1 tablet (25 mg total) by mouth 2 (two) times daily with a meal. 60 tablet 5   No facility-administered medications prior to visit.     ROS Review of Systems  Constitutional: Negative for activity change and appetite change.  HENT: Positive for dental problem. Negative for sinus pressure and sore throat.   Eyes: Negative for visual disturbance.  Respiratory: Negative for cough, chest tightness and shortness of breath.   Cardiovascular: Negative for chest pain and leg swelling.  Gastrointestinal: Negative for abdominal distention, abdominal pain, constipation and diarrhea.  Endocrine: Negative.   Genitourinary: Negative for dysuria.  Musculoskeletal: Negative for joint swelling and myalgias.  Skin: Negative for rash.  Allergic/Immunologic: Negative.   Neurological: Positive for numbness. Negative for weakness and light-headedness.  Psychiatric/Behavioral: Negative for dysphoric mood and suicidal ideas.    Objective:  BP 138/78 (BP Location: Right Arm, Patient Position: Sitting, Cuff Size: Large)   Pulse 87   Temp 98.1 F (36.7 C) (Oral)   Ht _0  (1.651 m)   Wt 209 lb 3.2 oz (94.9 kg)   SpO2 98%   BMI 34.81 kg/m   BP/Weight 11/10/2015 09/21/2015 05/02/4823  Systolic BP 003 704 888  Diastolic BP 78 82  90  Wt. (Lbs) 209.2 207 207.8  BMI 34.81 34.45 34.58      Physical Exam  Constitutional: He is oriented to person, place, and time. He appears well-developed and well-nourished.  Cardiovascular: Normal rate, normal heart sounds and intact distal pulses.   No murmur heard. Pulmonary/Chest: Effort normal and breath sounds normal. He has no wheezes. He has no rales. He exhibits no tenderness.  Abdominal: Soft. Bowel sounds are normal. He exhibits no distension and no mass. There is no tenderness.  Musculoskeletal: He exhibits edema (mild edema of the medial malleolus right ankle with healed vertical surgical scar).  Neurological: He is alert and oriented to person, place, and time.   Lab Results  Component Value Date   HGBA1C 9.6 (H) 09/21/2015    Lipid Panel     Component Value Date/Time   CHOL 201 (H) 09/21/2015 1449   TRIG 104 09/21/2015 1449   HDL 64 09/21/2015 1449   CHOLHDL 3.1 09/21/2015 1449   VLDL 21 09/21/2015 1449   LDLCALC 116 09/21/2015 1449    CMP Latest Ref Rng & Units 09/21/2015 09/08/2015 12/16/2014  Glucose 65 - 99 mg/dL 306(H) 508(H) 119(H)  BUN 7 - 25 mg/dL _1 Creatinine 0.60 - 1.35 mg/dL 1.29 1.22 1.03  Sodium 135 - 146 mmol/L 131(L) 131(L) 136  Potassium 3.5 - 5.3 mmol/L 4.7 5.2(H) 4.6  Chloride 98 - 110 mmol/L 96(L) 95(L) 97(L)  CO2 20 - 31 mmol/L _2 Calcium 8.6 - 10.3 mg/dL 9.2 9.8 9.8  Total Protein 6.1 - 8.1 g/dL 7.5 - -  Total Bilirubin 0.2 - 1.2 mg/dL 0.3 - -  Alkaline Phos 40 - 115 U/L 86 - -  AST 10 - 40 U/L 32 - -  ALT 9 - 46 U/L 41 - -      Assessment & Plan:   1. Type 2 diabetes mellitus with other specified complication ([place]) Uncontrolled with A1c of 9.6 from 09/2015 Increase metformin from 500 twice daily to 1000 mg twice daily Discussed available community resources for eye exam as he has no medical coverage - Glucose (CBG) - metFORMIN (GLUCOPHAGE) 500 MG tablet; Take 2 tablets (1,000 mg total) by mouth 2 (two) times  daily with a meal.  Dispense: 120 tablet; Refill: 3 - Blood Glucose Monitoring Suppl (TRUE METRIX METER) DEVI; 1 each by Does not apply route 3 (three) times daily before meals.  Dispense: 1 Device; Refill: 0 - TRUEPLUS LANCETS 28G MISC; 1 each by Does not apply route 3 (three) times daily before meals.  Dispense: 100 each; Refill: 12 - glucose blood (TRUE METRIX BLOOD GLUCOSE TEST) test strip; Use 3 times daily before meals  Dispense: 100 each; Refill: 12 - Microalbumin / creatinine urine ratio - Ambulatory referral to Podiatry  2. HIV (human immunodeficiency virus infection) ([place]) Currently on antiretrovirals therapy Keep appointment with infectious disease  3. Chronic systolic heart  failure (HCC) EF 30-35% with moderately to severely reduced systolic function from 2-D echo of 11/2014 Euvolemic at this time. Keep appointment with cardiology  4. Essential hypertension Controlled  5. Hyperlipidemia LDL goal is less than 100 Currently not on a statin and I have initiated Lipitor due to his cardiovascular risk - atorvastatin (LIPITOR) 20 MG tablet; Take 1 tablet (20 mg total) by mouth daily.  Dispense: 30 tablet; Refill: 3  6. Other diabetic neurological complication associated with type 2 diabetes mellitus ([place])  7. Dystrophic nail  - Ambulatory referral to Podiatry   Meds ordered this encounter  Medications  . metFORMIN (GLUCOPHAGE) 500 MG tablet    Sig: Take 2 tablets (1,000 mg total) by mouth 2 (two) times daily with a meal.    Dispense:  120 tablet    Refill:  3  . Blood Glucose Monitoring Suppl (TRUE METRIX METER) DEVI    Sig: 1 each by Does not apply route 3 (three) times daily before meals.    Dispense:  1 Device    Refill:  0  . TRUEPLUS LANCETS 28G MISC    Sig: 1 each by Does not apply route 3 (three) times daily before meals.    Dispense:  100 each    Refill:  12  . glucose blood (TRUE METRIX BLOOD GLUCOSE TEST) test strip    Sig: Use 3 times daily before meals      Dispense:  100 each    Refill:  12  . atorvastatin (LIPITOR) 20 MG tablet    Sig: Take 1 tablet (20 mg total) by mouth daily.    Dispense:  30 tablet    Refill:  3    Follow-up: Return in about 3 months (around 02/10/2016) for Follow-up on diabetes mellitus.   Arnoldo Morale MD

## 2015-11-10 NOTE — Progress Notes (Signed)
Out of metformin for two weeks Three teeth pulled one week ago Needs a glucometer

## 2015-11-10 NOTE — Patient Instructions (Signed)
Diabetes Mellitus and Food It is important for you to manage your blood sugar (glucose) level. Your blood glucose level can be greatly affected by what you eat. Eating healthier foods in the appropriate amounts throughout the day at about the same time each day will help you control your blood glucose level. It can also help slow or prevent worsening of your diabetes mellitus. Healthy eating may even help you improve the level of your blood pressure and reach or maintain a healthy weight.  General recommendations for healthful eating and cooking habits include:  Eating meals and snacks regularly. Avoid going long periods of time without eating to lose weight.  Eating a diet that consists mainly of plant-based foods, such as fruits, vegetables, nuts, legumes, and whole grains.  Using low-heat cooking methods, such as baking, instead of high-heat cooking methods, such as deep frying. Work with your dietitian to make sure you understand how to use the Nutrition Facts information on food labels. HOW CAN FOOD AFFECT ME? Carbohydrates Carbohydrates affect your blood glucose level more than any other type of food. Your dietitian will help you determine how many carbohydrates to eat at each meal and teach you how to count carbohydrates. Counting carbohydrates is important to keep your blood glucose at a healthy level, especially if you are using insulin or taking certain medicines for diabetes mellitus. Alcohol Alcohol can cause sudden decreases in blood glucose (hypoglycemia), especially if you use insulin or take certain medicines for diabetes mellitus. Hypoglycemia can be a life-threatening condition. Symptoms of hypoglycemia (sleepiness, dizziness, and disorientation) are similar to symptoms of having too much alcohol.  If your health care provider has given you approval to drink alcohol, do so in moderation and use the following guidelines:  Women should not have more than one drink per day, and men  should not have more than two drinks per day. One drink is equal to:  12 oz of beer.  5 oz of wine.  1 oz of hard liquor.  Do not drink on an empty stomach.  Keep yourself hydrated. Have water, diet soda, or unsweetened iced tea.  Regular soda, juice, and other mixers might contain a lot of carbohydrates and should be counted. WHAT FOODS ARE NOT RECOMMENDED? As you make food choices, it is important to remember that all foods are not the same. Some foods have fewer nutrients per serving than other foods, even though they might have the same number of calories or carbohydrates. It is difficult to get your body what it needs when you eat foods with fewer nutrients. Examples of foods that you should avoid that are high in calories and carbohydrates but low in nutrients include:  Trans fats (most processed foods list trans fats on the Nutrition Facts label).  Regular soda.  Juice.  Candy.  Sweets, such as cake, pie, doughnuts, and cookies.  Fried foods. WHAT FOODS CAN I EAT? Eat nutrient-rich foods, which will nourish your body and keep you healthy. The food you should eat also will depend on several factors, including:  The calories you need.  The medicines you take.  Your weight.  Your blood glucose level.  Your blood pressure level.  Your cholesterol level. You should eat a variety of foods, including:  Protein.  Lean cuts of meat.  Proteins low in saturated fats, such as fish, egg whites, and beans. Avoid processed meats.  Fruits and vegetables.  Fruits and vegetables that may help control blood glucose levels, such as apples, mangoes, and   yams.  Dairy products.  Choose fat-free or low-fat dairy products, such as milk, yogurt, and cheese.  Grains, bread, pasta, and rice.  Choose whole grain products, such as multigrain bread, whole oats, and brown rice. These foods may help control blood pressure.  Fats.  Foods containing healthful fats, such as nuts,  avocado, olive oil, canola oil, and fish. DOES EVERYONE WITH DIABETES MELLITUS HAVE THE SAME MEAL PLAN? Because every person with diabetes mellitus is different, there is not one meal plan that works for everyone. It is very important that you meet with a dietitian who will help you create a meal plan that is just right for you.   This information is not intended to replace advice given to you by your health care provider. Make sure you discuss any questions you have with your health care provider.   Document Released: 12/22/2004 Document Revised: 04/17/2014 Document Reviewed: 02/21/2013 Elsevier Interactive Patient Education 2016 Else[employer] Inc.  

## 2015-11-11 LAB — MICROALBUMIN / CREATININE URINE RATIO
CREATININE, URINE: 51 mg/dL (ref 20–370)
MICROALB UR: 4.1 mg/dL
MICROALB/CREAT RATIO: 80 ug/mg{creat} — AB (ref <30)

## 2015-11-15 ENCOUNTER — Ambulatory Visit: Payer: Self-pay | Attending: Podiatry | Admitting: Podiatry

## 2015-11-15 DIAGNOSIS — E1169 Type 2 diabetes mellitus with other specified complication: Secondary | ICD-10-CM

## 2015-11-15 DIAGNOSIS — B351 Tinea unguium: Secondary | ICD-10-CM

## 2015-11-15 NOTE — Progress Notes (Signed)
Subjective:     Patient ID: Shaun Brennan, male   DOB: Aug 29, 1975, 40 y.o.   MRN: [id number]  **Seen at the [facility]**  HPI Patient presents today for thick, painful, elongated toenails that he cannot trim himself. No redness or drainage. No recent injury to his foot. Remote injury to the right ankle several years ago, surgery done in [place]. No other concerns today.   Review of Systems  All other systems reviewed and are negative.      Objective:   Physical Exam General: AAO x3, NAD  Dermatological: Nails are hypertrophic, dystrophic, discolored x 10. Tenderness to nails 1-5 bilaterally. No redness, drainage, swelling or sings of infection. Scars from prior surgery are well healed.   Vascular: Dorsalis Pedis artery and Posterior Tibial artery pedal pulses are 2/4 bilateral with immedate capillary fill time. Pedal hair growth present.  There is no pain with calf compression, swelling, warmth, erythema.   Neruologic: Protective sensation decreased  Musculoskeletal: Decreased ROM right ankle.   Gait: Unassisted, Nonantalgic.      Assessment:     Symptomatic onychomycosis    Plan:     -Treatment options discussed including all alternatives, risks, and complications -Etiology of symptoms were discussed -Nails debrided 10 without complications or bleeding. -Daily foot inspection  Ovid Curd, DPM

## 2015-12-02 ENCOUNTER — Telehealth: Payer: Self-pay

## 2015-12-02 NOTE — Telephone Encounter (Signed)
Called patient to inquire about forms needed to be brought back to financial counselor Olegario Messier in order to obtain orange card for podiatry and opthalmology referral. Left message on voicemail to call back to office. Rejeana Brock, LPN

## 2015-12-09 ENCOUNTER — Inpatient Hospital Stay (HOSPITAL_COMMUNITY): Admission: RE | Admit: 2015-12-09 | Payer: Self-pay | Source: Ambulatory Visit

## 2015-12-20 ENCOUNTER — Telehealth (HOSPITAL_COMMUNITY): Payer: Self-pay | Admitting: Surgery

## 2015-12-20 NOTE — Telephone Encounter (Signed)
I spoke with Shaun Brennan--[facility] Community Paramedic regarding Shaun Brennan.  With the assistance of the [facility] Outpatient LCSW he has secured housing.  Secondary to his progression through the [employer] and fact that he is doing so well-- the decision was made for him to graduate from the program.   He will continue to be seen through the [facility].

## 2016-01-17 ENCOUNTER — Inpatient Hospital Stay (HOSPITAL_COMMUNITY)
Admission: EM | Admit: 2016-01-17 | Discharge: 2016-01-20 | DRG: 975 | Disposition: A | Discharge status: Home / Self Care | Payer: Self-pay | Attending: Family Medicine | Admitting: Family Medicine

## 2016-01-17 ENCOUNTER — Inpatient Hospital Stay (HOSPITAL_COMMUNITY): Payer: Self-pay

## 2016-01-17 ENCOUNTER — Encounter (HOSPITAL_COMMUNITY): Payer: Self-pay | Admitting: Emergency Medicine

## 2016-01-17 ENCOUNTER — Emergency Department (HOSPITAL_COMMUNITY): Payer: Self-pay

## 2016-01-17 DIAGNOSIS — I4581 Long QT syndrome: Secondary | ICD-10-CM | POA: Diagnosis present

## 2016-01-17 DIAGNOSIS — I110 Hypertensive heart disease with heart failure: Secondary | ICD-10-CM | POA: Diagnosis present

## 2016-01-17 DIAGNOSIS — I10 Essential (primary) hypertension: Secondary | ICD-10-CM

## 2016-01-17 DIAGNOSIS — I160 Hypertensive urgency: Secondary | ICD-10-CM | POA: Diagnosis present

## 2016-01-17 DIAGNOSIS — E1140 Type 2 diabetes mellitus with diabetic neuropathy, unspecified: Secondary | ICD-10-CM | POA: Diagnosis present

## 2016-01-17 DIAGNOSIS — B20 Human immunodeficiency virus [HIV] disease: Secondary | ICD-10-CM | POA: Diagnosis present

## 2016-01-17 DIAGNOSIS — E119 Type 2 diabetes mellitus without complications: Secondary | ICD-10-CM

## 2016-01-17 DIAGNOSIS — E785 Hyperlipidemia, unspecified: Secondary | ICD-10-CM | POA: Diagnosis present

## 2016-01-17 DIAGNOSIS — E669 Obesity, unspecified: Secondary | ICD-10-CM | POA: Diagnosis present

## 2016-01-17 DIAGNOSIS — R059 Cough, unspecified: Secondary | ICD-10-CM

## 2016-01-17 DIAGNOSIS — Z6832 Body mass index (BMI) 32.0-32.9, adult: Secondary | ICD-10-CM

## 2016-01-17 DIAGNOSIS — R0602 Shortness of breath: Secondary | ICD-10-CM | POA: Diagnosis present

## 2016-01-17 DIAGNOSIS — Z823 Family history of stroke: Secondary | ICD-10-CM

## 2016-01-17 DIAGNOSIS — Z23 Encounter for immunization: Secondary | ICD-10-CM

## 2016-01-17 DIAGNOSIS — R05 Cough: Secondary | ICD-10-CM

## 2016-01-17 DIAGNOSIS — Z7901 Long term (current) use of anticoagulants: Secondary | ICD-10-CM

## 2016-01-17 DIAGNOSIS — Z833 Family history of diabetes mellitus: Secondary | ICD-10-CM

## 2016-01-17 DIAGNOSIS — F17210 Nicotine dependence, cigarettes, uncomplicated: Secondary | ICD-10-CM | POA: Diagnosis present

## 2016-01-17 DIAGNOSIS — Z8679 Personal history of other diseases of the circulatory system: Secondary | ICD-10-CM

## 2016-01-17 DIAGNOSIS — F1010 Alcohol abuse, uncomplicated: Secondary | ICD-10-CM | POA: Diagnosis present

## 2016-01-17 DIAGNOSIS — I429 Cardiomyopathy, unspecified: Secondary | ICD-10-CM | POA: Diagnosis present

## 2016-01-17 DIAGNOSIS — E1169 Type 2 diabetes mellitus with other specified complication: Secondary | ICD-10-CM

## 2016-01-17 DIAGNOSIS — J181 Lobar pneumonia, unspecified organism: Secondary | ICD-10-CM

## 2016-01-17 DIAGNOSIS — J189 Pneumonia, unspecified organism: Principal | ICD-10-CM | POA: Diagnosis present

## 2016-01-17 DIAGNOSIS — I5022 Chronic systolic (congestive) heart failure: Secondary | ICD-10-CM | POA: Diagnosis present

## 2016-01-17 DIAGNOSIS — Z21 Asymptomatic human immunodeficiency virus [HIV] infection status: Secondary | ICD-10-CM | POA: Diagnosis present

## 2016-01-17 LAB — TROPONIN I
TROPONIN I: 0.05 ng/mL — AB (ref <0.03)
TROPONIN I: 0.05 ng/mL — AB (ref <0.03)

## 2016-01-17 LAB — I-STAT TROPONIN, ED
TROPONIN I, POC: 0.03 ng/mL (ref 0.00–0.08)
Troponin i, poc: 0.01 ng/mL (ref 0.00–0.08)

## 2016-01-17 LAB — COMPREHENSIVE METABOLIC PANEL
ALBUMIN: 3.6 g/dL (ref 3.5–5.0)
ALK PHOS: 82 U/L (ref 38–126)
ALT: 25 U/L (ref 17–63)
ANION GAP: 13 (ref 5–15)
AST: 28 U/L (ref 15–41)
BILIRUBIN TOTAL: 0.5 mg/dL (ref 0.3–1.2)
BUN: 11 mg/dL (ref 6–20)
CALCIUM: 9.2 mg/dL (ref 8.9–10.3)
CO2: 24 mmol/L (ref 22–32)
CREATININE: 1.1 mg/dL (ref 0.61–1.24)
Chloride: 98 mmol/L — ABNORMAL LOW (ref 101–111)
GFR calc non Af Amer: >60 mL/min (ref >60)
GLUCOSE: 354 mg/dL — AB (ref 65–99)
Potassium: 4 mmol/L (ref 3.5–5.1)
Sodium: 135 mmol/L (ref 135–145)
Total Protein: 7.6 g/dL (ref 6.5–8.1)

## 2016-01-17 LAB — I-STAT VENOUS BLOOD GAS, ED
Acid-Base Excess: 2 mmol/L (ref 0.0–2.0)
Bicarbonate: 26.7 mmol/L (ref 20.0–28.0)
O2 SAT: 47 %
PCO2 VEN: 39.4 mmHg — AB (ref 44.0–60.0)
PO2 VEN: 25 mmHg — AB (ref 32.0–45.0)
TCO2: 28 mmol/L (ref 0–100)
pH, Ven: 7.439 — ABNORMAL HIGH (ref 7.250–7.430)

## 2016-01-17 LAB — CBC WITH DIFFERENTIAL/PLATELET
BASOS PCT: 0 %
Basophils Absolute: 0 10*3/uL (ref 0.0–0.1)
EOS ABS: 0 10*3/uL (ref 0.0–0.7)
EOS PCT: 0 %
HCT: 40 % (ref 39.0–52.0)
HEMOGLOBIN: 13.7 g/dL (ref 13.0–17.0)
Lymphocytes Relative: 17 %
Lymphs Abs: 1.7 10*3/uL (ref 0.7–4.0)
MCH: 33.3 pg (ref 26.0–34.0)
MCHC: 34.3 g/dL (ref 30.0–36.0)
MCV: 97.1 fL (ref 78.0–100.0)
MONOS PCT: 8 %
Monocytes Absolute: 0.8 10*3/uL (ref 0.1–1.0)
NEUTROS PCT: 75 %
Neutro Abs: 7.4 10*3/uL (ref 1.7–7.7)
Platelets: 131 10*3/uL — ABNORMAL LOW (ref 150–400)
RBC: 4.12 MIL/uL — ABNORMAL LOW (ref 4.22–5.81)
RDW: 12.6 % (ref 11.5–15.5)
WBC: 10 10*3/uL (ref 4.0–10.5)

## 2016-01-17 LAB — STREP PNEUMONIAE URINARY ANTIGEN: Strep Pneumo Urinary Antigen: NEGATIVE

## 2016-01-17 LAB — GLUCOSE, CAPILLARY
GLUCOSE-CAPILLARY: 418 mg/dL — AB (ref 65–99)
GLUCOSE-CAPILLARY: 203 mg/dL — AB (ref 65–99)

## 2016-01-17 LAB — LIPID PANEL
Cholesterol: 230 mg/dL — ABNORMAL HIGH (ref 0–200)
HDL: 61 mg/dL
LDL Cholesterol: 149 mg/dL — ABNORMAL HIGH (ref 0–99)
Total CHOL/HDL Ratio: 3.8 ratio
Triglycerides: 101 mg/dL
VLDL: 20 mg/dL (ref 0–40)

## 2016-01-17 LAB — BRAIN NATRIURETIC PEPTIDE: B Natriuretic Peptide: 66.5 pg/mL (ref 0.0–100.0)

## 2016-01-17 MED ORDER — THIAMINE HCL 100 MG/ML IJ SOLN
100.0000 mg | Freq: Every day | INTRAMUSCULAR | Status: DC
  Filled 2016-01-17: qty 2

## 2016-01-17 MED ORDER — ENOXAPARIN SODIUM 40 MG/0.4ML ~~LOC~~ SOLN
40.0000 mg | SUBCUTANEOUS | Status: DC
Start: 2016-01-17 — End: 2016-01-20
  Administered 2016-01-17 – 2016-01-18 (×2): 40 mg via SUBCUTANEOUS
  Filled 2016-01-17 (×2): qty 0.4

## 2016-01-17 MED ORDER — SACUBITRIL-VALSARTAN 24-26 MG PO TABS
1.0000 | ORAL_TABLET | Freq: Two times a day (BID) | ORAL | Status: DC
  Administered 2016-01-17 – 2016-01-18 (×3): 1 via ORAL
  Filled 2016-01-17 (×4): qty 1

## 2016-01-17 MED ORDER — HYDRALAZINE HCL 50 MG PO TABS
100.0000 mg | ORAL_TABLET | Freq: Two times a day (BID) | ORAL | Status: DC
  Administered 2016-01-17 – 2016-01-20 (×6): 100 mg via ORAL
  Filled 2016-01-17 (×6): qty 2

## 2016-01-17 MED ORDER — VITAMIN B-1 100 MG PO TABS
100.0000 mg | ORAL_TABLET | Freq: Every day | ORAL | Status: DC
  Administered 2016-01-17 – 2016-01-20 (×4): 100 mg via ORAL
  Filled 2016-01-17 (×4): qty 1

## 2016-01-17 MED ORDER — LORAZEPAM 1 MG PO TABS: 1.0000 mg | ORAL_TABLET | Freq: Four times a day (QID) | ORAL | Status: AC | PRN

## 2016-01-17 MED ORDER — DEXTROSE 5 % IV SOLN
1.0000 g | Freq: Once | INTRAVENOUS | Status: AC
  Administered 2016-01-17: 1 g via INTRAVENOUS
  Filled 2016-01-17: qty 10

## 2016-01-17 MED ORDER — INSULIN ASPART 100 UNIT/ML ~~LOC~~ SOLN
0.0000 [IU] | Freq: Three times a day (TID) | SUBCUTANEOUS | Status: DC
  Administered 2016-01-17: 16 [IU] via SUBCUTANEOUS
  Administered 2016-01-18 (×2): 8 [IU] via SUBCUTANEOUS
  Administered 2016-01-18 – 2016-01-19 (×2): 5 [IU] via SUBCUTANEOUS
  Administered 2016-01-19: 8 [IU] via SUBCUTANEOUS
  Administered 2016-01-19: 15 [IU] via SUBCUTANEOUS
  Administered 2016-01-20 (×2): 3 [IU] via SUBCUTANEOUS

## 2016-01-17 MED ORDER — NITROGLYCERIN IN D5W 200-5 MCG/ML-% IV SOLN
0.0000 ug/min | Freq: Once | INTRAVENOUS | Status: AC
  Administered 2016-01-17: 5 ug/min via INTRAVENOUS
  Filled 2016-01-17: qty 250

## 2016-01-17 MED ORDER — LORAZEPAM 2 MG/ML IJ SOLN: 1.0000 mg | Freq: Four times a day (QID) | INTRAMUSCULAR | Status: AC | PRN

## 2016-01-17 MED ORDER — AZITHROMYCIN 500 MG IV SOLR
500.0000 mg | Freq: Once | INTRAVENOUS | Status: AC
  Administered 2016-01-17: 500 mg via INTRAVENOUS
  Filled 2016-01-17: qty 500

## 2016-01-17 MED ORDER — GUAIFENESIN ER 600 MG PO TB12
600.0000 mg | ORAL_TABLET | Freq: Two times a day (BID) | ORAL | Status: DC
  Administered 2016-01-17 – 2016-01-20 (×7): 600 mg via ORAL
  Filled 2016-01-17 (×7): qty 1

## 2016-01-17 MED ORDER — SULFAMETHOXAZOLE-TRIMETHOPRIM 800-160 MG PO TABS: 1.0000 | ORAL_TABLET | Freq: Two times a day (BID) | ORAL | Status: DC

## 2016-01-17 MED ORDER — ATORVASTATIN CALCIUM 20 MG PO TABS
20.0000 mg | ORAL_TABLET | Freq: Every day | ORAL | Status: DC
  Administered 2016-01-17: 20 mg via ORAL
  Filled 2016-01-17: qty 1

## 2016-01-17 MED ORDER — IPRATROPIUM-ALBUTEROL 0.5-2.5 (3) MG/3ML IN SOLN
3.0000 mL | Freq: Once | RESPIRATORY_TRACT | Status: AC
  Administered 2016-01-17: 3 mL via RESPIRATORY_TRACT
  Filled 2016-01-17: qty 3

## 2016-01-17 MED ORDER — INFLUENZA VAC SPLIT QUAD 0.5 ML IM SUSY
0.5000 mL | PREFILLED_SYRINGE | INTRAMUSCULAR | Status: AC
  Administered 2016-01-18: 0.5 mL via INTRAMUSCULAR
  Filled 2016-01-17: qty 0.5

## 2016-01-17 MED ORDER — DOLUTEGRAVIR SODIUM 50 MG PO TABS
50.0000 mg | ORAL_TABLET | Freq: Every day | ORAL | Status: DC
  Administered 2016-01-17 – 2016-01-20 (×4): 50 mg via ORAL
  Filled 2016-01-17 (×4): qty 1

## 2016-01-17 MED ORDER — ISOSORBIDE MONONITRATE ER 60 MG PO TB24
60.0000 mg | ORAL_TABLET | Freq: Every day | ORAL | Status: DC
  Administered 2016-01-17 – 2016-01-20 (×4): 60 mg via ORAL
  Filled 2016-01-17 (×4): qty 1

## 2016-01-17 MED ORDER — INSULIN ASPART 100 UNIT/ML ~~LOC~~ SOLN
0.0000 [IU] | Freq: Every day | SUBCUTANEOUS | Status: DC
  Administered 2016-01-17: 2 [IU] via SUBCUTANEOUS
  Administered 2016-01-18: 3 [IU] via SUBCUTANEOUS
  Administered 2016-01-19: 2 [IU] via SUBCUTANEOUS

## 2016-01-17 MED ORDER — CARVEDILOL 25 MG PO TABS
25.0000 mg | ORAL_TABLET | Freq: Two times a day (BID) | ORAL | Status: DC
  Administered 2016-01-17 – 2016-01-20 (×6): 25 mg via ORAL
  Filled 2016-01-17 (×6): qty 1

## 2016-01-17 MED ORDER — FOLIC ACID 1 MG PO TABS
1.0000 mg | ORAL_TABLET | Freq: Every day | ORAL | Status: DC
Start: 2016-01-17 — End: 2016-01-20
  Administered 2016-01-17 – 2016-01-20 (×4): 1 mg via ORAL
  Filled 2016-01-17 (×4): qty 1

## 2016-01-17 MED ORDER — IPRATROPIUM-ALBUTEROL 0.5-2.5 (3) MG/3ML IN SOLN
3.0000 mL | Freq: Four times a day (QID) | RESPIRATORY_TRACT | Status: DC | PRN
  Administered 2016-01-19 – 2016-01-20 (×2): 3 mL via RESPIRATORY_TRACT
  Filled 2016-01-17 (×2): qty 3

## 2016-01-17 MED ORDER — EMTRICITABINE-TENOFOVIR AF 200-25 MG PO TABS
1.0000 | ORAL_TABLET | Freq: Every day | ORAL | Status: DC
  Administered 2016-01-17 – 2016-01-20 (×4): 1 via ORAL
  Filled 2016-01-17 (×5): qty 1

## 2016-01-17 MED ORDER — PIPERACILLIN-TAZOBACTAM 3.375 G IVPB
3.3750 g | Freq: Three times a day (TID) | INTRAVENOUS | Status: DC
  Administered 2016-01-17: 3.375 g via INTRAVENOUS
  Filled 2016-01-17 (×3): qty 50

## 2016-01-17 MED ORDER — LEVOFLOXACIN IN D5W 500 MG/100ML IV SOLN
500.0000 mg | INTRAVENOUS | Status: DC
  Administered 2016-01-18 – 2016-01-19 (×2): 500 mg via INTRAVENOUS
  Filled 2016-01-17 (×3): qty 100

## 2016-01-17 MED ORDER — ADULT MULTIVITAMIN W/MINERALS CH
1.0000 | ORAL_TABLET | Freq: Every day | ORAL | Status: DC
  Administered 2016-01-17 – 2016-01-20 (×4): 1 via ORAL
  Filled 2016-01-17 (×4): qty 1

## 2016-01-17 NOTE — Progress Notes (Signed)
CRITICAL VALUE ALERT  Critical value received:  Troponin 0.05  Date of notification:  01/17/16  Time of notification:  1541  Critical value read back:yes  Nurse who received alert: Janell Quiet  MD notified (1st page):  FPTS Intern  Time of first page: 1543  MD notified (2nd page):  Time of second page:  Responding MD:  FPTS Intern  Time MD responded: [phone]

## 2016-01-17 NOTE — Care Management Note (Signed)
Case Management Note  Patient Details  Name: Shaun Brennan MRN: [id number] Date of Birth: [birth year]  Subjective/Objective:                 Independent patient from home, who lives with friend, admitted w/ PNA. 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 Ninetta Lights, is compliant with ART, viral load non detectable. Admitted for IV Abx.  PCP Dr Venetia Night   Action/Plan:  No CM needs identified at this time, will continue to follow.   Expected Discharge Date:                  Expected Discharge Plan:  Home/Self Care  In-House Referral:  NA  Discharge planning Services  NA  Post Acute Care Choice:  NA Choice offered to:  NA  DME Arranged:  N/A DME Agency:  NA  HH Arranged:  NA HH Agency:  NA  Status of Service:  Completed, signed off  If discussed at Long Length of Stay Meetings, dates discussed:    Additional Comments:  Lawerance Sabal, RN 01/17/2016, 4:10 PM

## 2016-01-17 NOTE — ED Triage Notes (Signed)
Pt here with c/o sob and cough , pt only c/o chest pain with the cough , pt received 5 of albuterol by EMS

## 2016-01-17 NOTE — ED Provider Notes (Signed)
[facility] DEPT Provider Note   CSN: [id number] Arrival date & time: 01/17/16  6283     History   Chief Complaint Chief Complaint  Patient presents with  . Shortness of Breath    HPI Shaun Brennan is a 40 y.o. male.  The history is provided by the patient.  Shortness of Breath  This is a new problem. The average episode lasts 12 days. The problem occurs continuously.The current episode started more than 1 week ago. The problem has been gradually worsening. Associated symptoms include cough, sputum production, PND, orthopnea and chest pain. Pertinent negatives include no fever, no headaches, no hemoptysis, no vomiting, no abdominal pain, no rash, no leg pain and no leg swelling. Associated symptoms comments: Yellow sputum. It is unknown what precipitated the problem. He has tried beta-agonist inhalers for the symptoms. The treatment provided no relief. Associated medical issues include asthma and heart failure. Associated medical issues do not include PE or DVT.    Past Medical History:  Diagnosis Date  . Chronic systolic heart failure (HCC)    a. EF 15-20%, grade II DD, LA mild/mod dilated  . Diabetes mellitus without complication ([place])   . DM2 (diabetes mellitus, type 2) ([place])   . HIV (human immunodeficiency virus infection) ([place])   . Hypertension     Patient Active Problem List   Diagnosis Date Noted  . Shortness of breath 01/17/2016  . Accelerated hypertension   . Hyperlipidemia 11/10/2015  . Diabetic neuropathy ([place]) 11/10/2015  . History of ETOH abuse 09/08/2015  . Suicidal ideation 07/14/2014  . Major depression 07/14/2014  . Depression, major, single episode, moderate ([place]) 07/11/2014  . Community acquired pneumonia 07/07/2014  . Community acquired pneumonia of right lower lobe of lung ([place]) 07/07/2014  . HTN (hypertension) 12/17/2013  . Chronic systolic heart failure ([place]) 12/17/2013  . Diabetes mellitus due to underlying condition without complications  ([place]) 15/17/6160  . Smoking 12/12/2013  . Acute on chronic systolic heart failure ([place]) 12/09/2013  . Acute respiratory distress 12/06/2013  . 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 DM2 (diabetes mellitus, type 2) ([place]) 12/06/2013  . HIV (human immunodeficiency virus infection) ([place]) 12/06/2013  . H/O CHF 12/06/2013  . Cardiomegaly: per cxr 12/06/13 12/06/2013    Past Surgical History:  Procedure Laterality Date  . ANKLE SURGERY Right        Home Medications    Prior to Admission medications   Medication Sig Start Date End Date Taking? Authorizing Provider  atorvastatin (LIPITOR) 20 MG tablet Take 1 tablet (20 mg total) by mouth daily. 11/10/15  Yes Arnoldo Morale, MD  Blood Glucose Monitoring Suppl (TRUE METRIX METER) DEVI 1 each by Does not apply route 3 (three) times daily before meals. 11/10/15  Yes Arnoldo Morale, MD  carvedilol (COREG) 25 MG tablet TAKE 1 TABLET BY MOUTH 2 TIMES DAILY WITH A MEAL. 10/06/15  Yes Shaune Pascal Bensimhon, MD  dolutegravir (TIVICAY) 50 MG tablet Take 1 tablet (50 mg total) by mouth daily. 10/05/15  Yes Campbell Riches, MD  emtricitabine-tenofovir AF (DESCOVY) 200-25 MG tablet Take 1 tablet by mouth daily. 10/05/15  Yes Campbell Riches, MD  furosemide (LASIX) 40 MG tablet TAKE 1 TABLET BY MOUTH ON MONDAYS & FRIDAYS 08/10/15  Yes [name] D Clegg, NP  glucose blood (TRUE METRIX BLOOD GLUCOSE TEST) test strip Use 3 times daily before meals 11/10/15  Yes Arnoldo Morale, MD  glucose monitoring kit (FREESTYLE) monitoring kit 1 each by Does not apply route 4 (four) times daily -  after meals and at bedtime. 1 month Diabetic Testing Supplies for QAC-QHS accuchecks. 12/12/13  Yes Lorayne Marek, MD  hydrALAZINE (APRESOLINE) 50 MG tablet Take 2 tablets (100 mg total) by mouth 2 (two) times daily. 08/11/15  Yes [name] D Clegg, NP  HYDROcodone-acetaminophen (NORCO/VICODIN) 5-325 MG tablet Take 1 tablet by mouth every 4 (four) hours as needed for pain. 11/04/15  Yes Historical Provider, MD    isosorbide mononitrate (IMDUR) 60 MG 24 hr tablet Take 1 tablet (60 mg total) by mouth daily. 03/08/15  Yes Jolaine Artist, MD  metFORMIN (GLUCOPHAGE) 500 MG tablet Take 2 tablets (1,000 mg total) by mouth 2 (two) times daily with a meal. 11/10/15  Yes Arnoldo Morale, MD  sacubitril-valsartan (ENTRESTO) 24-26 MG Take 1 tablet by mouth 2 (two) times daily. 09/21/15  Yes Jolaine Artist, MD  TRUEPLUS LANCETS 28G MISC 1 each by Does not apply route 3 (three) times daily before meals. 11/10/15  Yes Arnoldo Morale, MD    Family History Family History  Problem Relation Age of Onset  . Other Father   . Diabetes Mother   . Stroke Paternal Grandfather     Social History Social History  Substance Use Topics  . Smoking status: Current Every Day Smoker    Packs/day: 0.25    Types: Cigarettes  . Smokeless tobacco: Never Used     Comment: about 10 cigarettes a week  . Alcohol use 14.4 oz/week    12 Cans of beer, 12 Shots of liquor per week     Allergies   Lisinopril   Review of Systems Review of Systems  Constitutional: Positive for chills and fatigue. Negative for diaphoresis and fever.  HENT: Positive for congestion.   Respiratory: Positive for cough, sputum production and shortness of breath. Negative for hemoptysis.   Cardiovascular: Positive for chest pain, orthopnea and PND. Negative for leg swelling.  Gastrointestinal: Negative for abdominal pain, nausea and vomiting.  Genitourinary: Negative for flank pain.  Musculoskeletal: Negative for myalgias.  Skin: Negative for rash.  Neurological: Negative for headaches.  Psychiatric/Behavioral: Negative for confusion.     Physical Exam Updated Vital Signs BP (!) 166/114 (BP Location: Left Arm)   Pulse (!) 106   Temp 99.1 F (37.3 C) (Oral)   Resp 20   SpO2 98%   Physical Exam  Constitutional: He is oriented to person, place, and time. He appears well-developed and well-nourished. No distress.  Pleasant, cooperative, ill but  non-toxic appearing  HENT:  Head: Normocephalic and atraumatic.  Mouth/Throat: Oropharynx is clear and moist.  Eyes: Conjunctivae are normal. No scleral icterus.  Neck: Neck supple. No JVD present. No tracheal deviation present.  Cardiovascular: Normal rate, regular rhythm and intact distal pulses.  Exam reveals no gallop and no friction rub.   Mildly tachycaridic to 105  Pulmonary/Chest: He has wheezes. He has rales.  tachypneic to 20's, speaking 4-5 word sentences, rales throughout b/l lower and middle lung fields, worse right lower side, wheezing throughout b/l upper and middle lung fields.   Abdominal: Soft. He exhibits no distension. There is no tenderness.  Musculoskeletal: He exhibits no edema or tenderness.  Symmetric size and appearance of b/l Le's, no calf swelling or tenderness  Neurological: He is alert and oriented to person, place, and time. He exhibits normal muscle tone. Coordination normal.  Skin: Skin is warm and dry. No rash noted. He is not diaphoretic.  Psychiatric: He has a normal mood and affect.  Nursing note and vitals reviewed.  ED Treatments / Results  Labs (all labs ordered are listed, but only abnormal results are displayed) Labs Reviewed  COMPREHENSIVE METABOLIC PANEL - Abnormal; Notable for the following:       Result Value   Chloride 98 (*)    Glucose, Bld 354 (*)    All other components within normal limits  CBC WITH DIFFERENTIAL/PLATELET - Abnormal; Notable for the following:    RBC 4.12 (*)    Platelets 131 (*)    All other components within normal limits  TROPONIN I - Abnormal; Notable for the following:    Troponin I 0.05 (*)    All other components within normal limits  LIPID PANEL - Abnormal; Notable for the following:    Cholesterol 230 (*)    LDL Cholesterol 149 (*)    All other components within normal limits  I-STAT VENOUS BLOOD GAS, ED - Abnormal; Notable for the following:    pH, Ven 7.439 (*)    pCO2, Ven 39.4 (*)    pO2, Ven  25.0 (*)    All other components within normal limits  RESPIRATORY PANEL BY PCR  BRAIN NATRIURETIC PEPTIDE  STREP PNEUMONIAE URINARY ANTIGEN  BLOOD GAS, VENOUS  TROPONIN I  TROPONIN I  HEMOGLOBIN A1C  T-HELPER CELLS (CD4) COUNT (NOT AT [facility])  HIV 1 RNA QUANT-NO REFLEX-BLD  LEGIONELLA PNEUMOPHILA SEROGP 1 UR AG  BASIC METABOLIC PANEL  I-STAT TROPOININ, ED  I-STAT TROPOININ, ED    EKG  EKG Interpretation  Date/Time:  Monday January 17 2016 08:36:58 EDT Ventricular Rate:  110 PR Interval:    QRS Duration: 119 QT Interval:  365 QTC Calculation: 494 R Axis:   -40 Text Interpretation:  Sinus tachycardia LAE, consider biatrial enlargement Left anterior fascicular block LVH with secondary repolarization abnormality Anterior Q waves, possibly due to LVH SINCE LAST TRACING HEART RATE HAS INCREASED Confirmed by BELFI  MD, MELANIE ([id number]) on 01/17/2016 8:43:11 AM       Radiology Dg Chest 2 View  Result Date: 01/17/2016 CLINICAL DATA:  Ten days of productive cough and shortness of breath. Coughing worse when lying down. Patient also reports fatigue. Current smoker, history of diabetes and hypertension. Also history of HIV, CHF EXAM: CHEST  2 VIEW COMPARISON:  Portable chest x-ray of January 17, 2016 and PA and lateral chest x-ray of June 14, 2015. FINDINGS: The heart size and mediastinal contours are within normal limits. Both lungs are clear. The visualized skeletal structures are unremarkable. IMPRESSION: Patchy parenchymal density bilaterally worrisome for early pneumonia. Followup PA and lateral chest X-ray is recommended in 3-4 weeks following trial of antibiotic therapy to ensure resolution and exclude underlying malignancy. Previous granulomatous infection.  No evidence of CHF. Electronically Signed   By: David  Martinique M.D.   On: 01/17/2016 16:54   Dg Chest Portable 1 View  Result Date: 01/17/2016 CLINICAL DATA:  Shortness of breath and cough for 10 days EXAM: PORTABLE CHEST 1 VIEW  COMPARISON:  June 14, 2015 FINDINGS: There is no edema or consolidation. There are scattered tiny granulomas throughout the lungs bilaterally. The heart size and pulmonary vascularity are normal. No adenopathy. There is a calcified azygos lymph node. No bone lesions. IMPRESSION: Evidence prior granulomatous disease. No edema or consolidation. Stable cardiac silhouette. Electronically Signed   By: Lowella Grip III M.D.   On: 01/17/2016 09:00    Procedures Procedures (including critical care time)  Medications Ordered in ED Medications  atorvastatin (LIPITOR) tablet 20 mg (not administered)  dolutegravir (TIVICAY)  tablet 50 mg (50 mg Oral Given 01/17/16 1348)  emtricitabine-tenofovir AF (DESCOVY) 200-25 MG per tablet 1 tablet (1 tablet Oral Given 01/17/16 1429)  carvedilol (COREG) tablet 25 mg (not administered)  sacubitril-valsartan (ENTRESTO) 24-26 mg per tablet (1 tablet Oral Given 01/17/16 1429)  hydrALAZINE (APRESOLINE) tablet 100 mg (not administered)  isosorbide mononitrate (IMDUR) 24 hr tablet 60 mg (60 mg Oral Given 01/17/16 1348)  enoxaparin (LOVENOX) injection 40 mg (40 mg Subcutaneous Given 01/17/16 1351)  insulin aspart (novoLOG) injection 0-15 Units (not administered)  insulin aspart (novoLOG) injection 0-5 Units (not administered)  guaiFENesin (MUCINEX) 12 hr tablet 600 mg (600 mg Oral Given 01/17/16 1428)  LORazepam (ATIVAN) tablet 1 mg (not administered)    Or  LORazepam (ATIVAN) injection 1 mg (not administered)  thiamine (VITAMIN B-1) tablet 100 mg (100 mg Oral Given 01/17/16 1420)    Or  thiamine (B-1) injection 100 mg ( Intravenous See Alternative 46/8/03 2122)  folic acid (FOLVITE) tablet 1 mg (1 mg Oral Given 01/17/16 1420)  multivitamin with minerals tablet 1 tablet (1 tablet Oral Given 01/17/16 1420)  levofloxacin (LEVAQUIN) IVPB 500 mg (not administered)  ipratropium-albuterol (DUONEB) 0.5-2.5 (3) MG/3ML nebulizer solution 3 mL (not administered)  nitroGLYCERIN 50 mg  in dextrose 5 % 250 mL (0.2 mg/mL) infusion (0 mcg/min Intravenous Stopped 01/17/16 1205)  ipratropium-albuterol (DUONEB) 0.5-2.5 (3) MG/3ML nebulizer solution 3 mL (3 mLs Nebulization Given 01/17/16 1056)  cefTRIAXone (ROCEPHIN) 1 g in dextrose 5 % 50 mL IVPB (0 g Intravenous Stopped 01/17/16 1219)  azithromycin (ZITHROMAX) 500 mg in dextrose 5 % 250 mL IVPB (500 mg Intravenous New Bag/Given 01/17/16 1220)  ipratropium-albuterol (DUONEB) 0.5-2.5 (3) MG/3ML nebulizer solution 3 mL (3 mLs Nebulization Given 01/17/16 1505)     Initial Impression / Assessment and Plan / ED Course  I have reviewed the triage vital signs and the nursing notes.  Pertinent labs & imaging results that were available during my care of the patient were reviewed by me and considered in my medical decision making (see chart for details).  Clinical Course   Ronnel Rekowski is a 40 y.o. male with h/o HFrEF (30% on 11/2014 and grade II diastolic dysfunction), HIV (Cd4 380 and undetectable viral load 09/21/15), who presents to ED for gradual-onset dyspnea x 2 weeks (since 09/27), associated with orthopnea, DOE, cough productive of yellow sputum, no fevers. Initial impression was HF exacerbation, so started on nitro drip with his hypertension of about 200/100, however, CXR without pulmonary edema, so nitro drip discontinued. Pt denies any CP at rest, only diffuse chest discomfort with coughing fits, none otherwise. Given DuoNebs, which seemed to help his breathing some. Empirically treated for CAP with rocephin and azithromycin, admitted for further management, as pt could use a couple days of  IV abx given his HIV status, and scheduled breathing treatments/pulmonary toilet for his dyspnea.   Pt condition, course, and admission were discussed with attending physician Dr. Malvin Johns.  Final Clinical Impressions(s) / ED Diagnoses   Final diagnoses:  SOB (shortness of breath)  Community acquired pneumonia of right lower lobe of lung  ([place])  Shortness of breath    New Prescriptions Current Discharge Medication List       Paralee Cancel, MD 01/17/16 [place], MD 01/18/16 [phone]

## 2016-01-17 NOTE — Progress Notes (Signed)
Discussed care with Dr Drue Second, ID.  She notes that Zosyn/ Bactrim likely not needed given recent CD4 420 and undetectable viral load.  Very unlikely that he would have PCP unless noncompliant with HIV meds or CD4 ~100.  Ok to discontinue and transition to Rocephin/Azithro.  Discussed that QTc prolonged, so she recommends Levaquin instead.  Recommends Urine strep, legionella and RVP.  No need for formal consult unless clinically worsens.  Greatly appreciate Dr Feliz Beam time and assistance with this patient.    Nayab Aten M. Nadine Counts, DO PGY-3, [facility]

## 2016-01-17 NOTE — H&P (Signed)
[facility] Admission History and Physical Service Pager: [phone]  Patient name: Shaun Brennan Medical record number: [id number] Date of birth: [birth year] Age: 40 y.o. Gender: male  Primary Care Provider: Arnoldo Morale, MD Consultants: ID  Code Status: Full (obtained on admission)  Chief Complaint: Shortness of breath and cough  Assessment and Plan: Shaun Brennan is a 40 y.o. male presenting with shortness of breath and cough . PMH is significant for HIV, DM 2, CHF, hypertension, tobacco use disorder, major depression and hyperlipidemia  Shortness of breath/cough: Evaluation of symptoms suggestive for infectious etiology likely pneumonia after URI. Exam remarkable for crackles bilaterally over low lung fields. Chest x-ray with increased opacity over lower lung fields although it was read negative for edema or consolidation. Patient's is immunocompromised with HIV and diabetes. He has chest pain but this is only when she coughs. Unlikely ACS with no significant changes on his EKG and negative troponin. Chest pain is also not exertional. Unlikely PE as well as cough 1.5 (tachycardia). Unlikely CHF with BNP of 66 also patient appears obese.  -Admit to MedSurg. Attending Dr. Ree Kida -Status post a dose of ceftriaxone and azithromycin -Start Zosyn for broad coverage. I'll be cautious with azithromycin given prolonged QTC -Bactrim DS for possible PCP in patient with HIV as her last CD4 count was 420 4 months ago.  -DuoNeb every 4 hours as needed -Mucinex -Consider viral panel -Cycle troponin, although low suspicion for ACS at this time -Oxygen as needed -Blood Culture if febrile. He has already received azithromycin and ceftriaxone.    HIV:  CD4 420 and undetectable viral RNA on 09/21/2015. On Tivicay and Descovy at home. Reports good compliance with his medications. He is followed by Dr. Johnnye Sima from [place] -Consider consulting ID -Continue home  medications  Hypertension: Patient with hypertensive urgency on arrival. No end organ damage at this time.  -Continue home meds as above -Continue home hydralazine  HFrEF: Last echo in 11/2014 with ejection fraction of 30-35%, severely dilated cavity and G2DD. Last visit to cardiology office appears to be 09/08/2015. BNP 66 on admission. No sign of fluid overload. Some crackles over lower lung fields but chest x-ray without edema. -Continue home meds (coreg, Imdur, lasix and Entresto). Hold losartan. Unclear if he is taking this with Entresto.  -Consider echocardiogram  DM-2: Last A1c 9.6 on 09/21/2015. Blood glucose elevated to mid 300s on admission. Blood work not concerning for DKA or HHS. -SSI-intermediate -A1c  Hyperlipidemia: Last lipid panel in 09/2015. Definitely significant risk factor for ASCVD. Not able to calculate the risk because of his age -Lipid panel -Consider starting statin and aspirin  Major depression: Doesn't appear to be on medication. Denies suicidal ideation -The monitoring  Tobacco use disorder: Reports smoking 2-3 cigarettes a day. Declines nicotine patch  Alcohol use: Reports drinking up to a pack over the weekends. Recent drink 2 or the 40 ounce beers this weekend. -CIWA  FEN/GI:  -Regular diet  -Saline lock  Prophylaxis: Lovenox   Disposition: Admit to [facility] for evaluation and treatment of possible pneumonia in immunocompromised patient with HIV and diabetes   History of Present Illness:  Shaun Brennan is a 40 y.o. male presenting with shortness of breath and cough  Patient reports cold-like symptoms (runny nose, congestion and cough) for about a week ago. Later he developed shortness of breath that is progressively getting worse and he had to come into the ED. He endorses cough which is productive with yellowish sputum. He denies hemoptysis.  He also reports chest pain with cough but not with exertion or at rest. Cough worse with lying flat. He uses  only one pillow which is his baseline. He denies leg swelling. He denies fever, chills, nausea, vomiting, diarrhea, dysuria, recent travel flight. He also denies sick contacts. He reports taking his Lasix as prescribed, which was twice a week, Monday and Friday. He reports good compliance with his HIV medications. His last dose on all his medications was last night  Socially, patient lives with his friend. He reports smoking about 2-3 cigarettes a day. Reports drinking up to a pack of beer over the weekends. However, this weekend he only had 2of the 40 ounce beers  ED course: Vital signs remarkable for tachycardia to low 100s and blood pressure in the 180s over 120s. CBC within normal limits except mild thrombocytopenia to 131. CMP within normal limits except for glucose to 354. VBG 7.4/39/26/47. Point-of-care troponin negative 2. BNP 66.5. EKG with sinus tachycardia, LAE, L AFB, LVH and borderline QTC. Chest x-ray read as scattered tiny granulomas throughout the lungs bilaterally (not new) but without edema or consolidation although there is increased opacity over lower lung fields. He was given azithromycin 500 mg once, ceftriaxone 1 g once, nitroglycerin tries and DuoNeb once. Family medicine teaching service was contacted for admission out of concern for right lower lobe pneumonia in immunocompromised patient.  Review Of Systems: Per HPI Cold about a week ago. Couldn't lie flat. Coughand interested ROS  Patient Active Problem List   Diagnosis Date Noted  . Hyperlipidemia 11/10/2015  . Diabetic neuropathy ([place]) 11/10/2015  . History of ETOH abuse 09/08/2015  . Suicidal ideation 07/14/2014  . Major depression 07/14/2014  . Depression, major, single episode, moderate ([place]) 07/11/2014  . Community acquired pneumonia 07/07/2014  . CAP (community acquired pneumonia) 07/07/2014  . HTN (hypertension) 12/17/2013  . Chronic systolic heart failure ([place]) 12/17/2013  . Diabetes mellitus due to  underlying condition without complications ([place]) 00/93/8182  . Smoking 12/12/2013  . Acute on chronic systolic heart failure ([place]) 12/09/2013  . Acute respiratory distress 12/06/2013  . 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 DM2 (diabetes mellitus, type 2) ([place]) 12/06/2013  . HIV (human immunodeficiency virus infection) ([place]) 12/06/2013  . H/O CHF 12/06/2013  . Cardiomegaly: per cxr 12/06/13 12/06/2013    Past Medical History: Past Medical History:  Diagnosis Date  . Chronic systolic heart failure (HCC)    a. EF 15-20%, grade II DD, LA mild/mod dilated  . Diabetes mellitus without complication ([place])   . DM2 (diabetes mellitus, type 2) ([place])   . HIV (human immunodeficiency virus infection) ([place])   . Hypertension     Past Surgical History: Past Surgical History:  Procedure Laterality Date  . ANKLE SURGERY Right     Social History: Social History  Substance Use Topics  . Smoking status: Current Every Day Smoker    Packs/day: 0.25    Types: Cigarettes  . Smokeless tobacco: Never Used     Comment: about 10 cigarettes a week  . Alcohol use 14.4 oz/week    12 Cans of beer, 12 Shots of liquor per week   Additional social history: none  Please also refer to relevant sections of EMR.  Family History: Family History  Problem Relation Age of Onset  . Other Father   . Diabetes Mother   . Stroke Paternal Grandfather    (If not completed, MUST add something in)  Allergies and Medications: Allergies  Allergen Reactions  . Lisinopril  Cough   No current facility-administered medications on file prior to encounter.    Current Outpatient Prescriptions on File Prior to Encounter  Medication Sig Dispense Refill  . atorvastatin (LIPITOR) 20 MG tablet Take 1 tablet (20 mg total) by mouth daily. 30 tablet 3  . Blood Glucose Monitoring Suppl (TRUE METRIX METER) DEVI 1 each by Does not apply route 3 (three) times daily before meals. 1 Device 0  . carvedilol (COREG) 25 MG tablet TAKE 1  TABLET BY MOUTH 2 TIMES DAILY WITH A MEAL. 60 tablet 3  . dolutegravir (TIVICAY) 50 MG tablet Take 1 tablet (50 mg total) by mouth daily. 30 tablet 6  . emtricitabine-tenofovir AF (DESCOVY) 200-25 MG tablet Take 1 tablet by mouth daily. 90 tablet 3  . furosemide (LASIX) 40 MG tablet TAKE 1 TABLET BY MOUTH ON MONDAYS & FRIDAYS 10 tablet PRN  . glucose blood (TRUE METRIX BLOOD GLUCOSE TEST) test strip Use 3 times daily before meals 100 each 12  . glucose monitoring kit (FREESTYLE) monitoring kit 1 each by Does not apply route 4 (four) times daily - after meals and at bedtime. 1 month Diabetic Testing Supplies for QAC-QHS accuchecks. 1 each 1  . hydrALAZINE (APRESOLINE) 50 MG tablet Take 2 tablets (100 mg total) by mouth 2 (two) times daily. 120 tablet 3  . isosorbide mononitrate (IMDUR) 60 MG 24 hr tablet Take 1 tablet (60 mg total) by mouth daily. 30 tablet 6  . losartan (COZAAR) 100 MG tablet Take 1 tablet (100 mg total) by mouth daily. 30 tablet 6  . metFORMIN (GLUCOPHAGE) 500 MG tablet Take 2 tablets (1,000 mg total) by mouth 2 (two) times daily with a meal. 120 tablet 3  . sacubitril-valsartan (ENTRESTO) 24-26 MG Take 1 tablet by mouth 2 (two) times daily. 60 tablet 11  . TRUEPLUS LANCETS 28G MISC 1 each by Does not apply route 3 (three) times daily before meals. 100 each 12    Objective: BP (!) 178/106   Pulse 116   Temp 98.6 F (37 C) (Oral)   Resp 23   SpO2 100%  Exam: GEN: appears obese, in some distress from cough HEENT:  no conjunctival injection, sclera anicteric, crusted rhinorrhea, congestion or erythema, mmm without erythema or exudation CVS: RRR, normal s1 and s2, no murmurs, no edema RESP: mildly increased work of breathing, good air movement bilaterally, some crackles over lower lung fields bilaterally, negative for wheeze GI: Obese, soft, non-tender,non-distended NEURO: alert and oriented appropriately, no gross defecits  PSYCH: appropriate mood and affect   Labs and  Imaging: CBC BMET   Recent Labs Lab 01/17/16 0849  WBC 10.0  HGB 13.7  HCT 40.0  PLT 131*    Recent Labs Lab 01/17/16 0849  NA 135  K 4.0  CL 98*  CO2 24  BUN 11  CREATININE 1.10  GLUCOSE 354*  CALCIUM 9.2     Dg Chest Portable 1 View  Result Date: 01/17/2016 CLINICAL DATA:  Shortness of breath and cough for 10 days EXAM: PORTABLE CHEST 1 VIEW COMPARISON:  June 14, 2015 FINDINGS: There is no edema or consolidation. There are scattered tiny granulomas throughout the lungs bilaterally. The heart size and pulmonary vascularity are normal. No adenopathy. There is a calcified azygos lymph node. No bone lesions. IMPRESSION: Evidence prior granulomatous disease. No edema or consolidation. Stable cardiac silhouette. Electronically Signed   By: Lowella Grip III M.D.   On: 01/17/2016 09:00    Mercy Riding, MD 01/17/2016,  11:53 AM PGY-2, [place] Intern pager: [phone], text pages welcome

## 2016-01-17 NOTE — ED Notes (Signed)
Per Micromedex nitro and rocephin are IV compatible.

## 2016-01-17 NOTE — Consult Note (Signed)
Advanced Heart Failure Team Consult Note  Referring Physician: Dr. Ree Kida Primary Physician: Dr. Charlane Ferretti Primary Cardiologist:  Dr. Haroldine Laws   Reason for Consultation: SOB, history of Systolic EF  HPI:    Shaun Brennan is a 40 y.o. male with a history of HIV, DM2, HTN, tobacco abuse and chronic systolic heart failure (due to presumed NICM EF 25-30% - no cath).   Last seen in HF clinic 09/08/15. Was thought to be improved. Switched to [employer].  Paramedicine continued. Weight 207 lbs at that time.   He presented to [facility]/17/16 with SOB and cough. CXR read as no edema or consolidation, but increased opacity over lower lung fields. Pertinent labs on admission include K 4.0, Creatinine 1.10, BNP 66.5, WBC 10.0.  Venous Blood Gas pH 7.439, pCO2 39.4, p02 25, 02 sat 47.0, Bicarb 26.7.  ID consulted with HIV history. They say unlikely PCP with recent CD4 count of 420 and undetectable viral load. Recommend treat with Levaquin. No formal consult unless clinically worsens. Afebrile.   Says he's been feeling bad for the past few weeks. More SOB walking around the house. Coughing. Denies fevers or chills. Still smoking ~2-3 cigarettes a day. Drank 2 40 oz beers over the weekend.  Taking all medications as directed. States he has NOT been taking both Losartan and Entresto, only taking Entresto. Weight has been stable. Paramedicine no longer following (graduated last month).   Review of Systems: [y] = yes, '[ ]'$  = no   General: Weight gain '[ ]'$ ; Weight loss '[ ]'$ ; Anorexia '[ ]'$ ; Fatigue [ y]; Fever Blue.Reese ]; Chills '[ ]'$ ; Weakness '[ ]'$   Cardiac: Chest pain/pressure '[ ]'$ ; Resting SOB '[ ]'$ ; Exertional SOB Blue.Reese ]; Orthopnea '[ ]'$ ; Pedal Edema '[ ]'$ ; Palpitations '[ ]'$ ; Syncope '[ ]'$ ; Presyncope '[ ]'$ ; Paroxysmal nocturnal dyspnea'[ ]'$   Pulmonary: Cough [y]; Wheezing'[ ]'$ ; Hemoptysis'[ ]'$ ; Sputum '[ ]'$ ; Snoring '[ ]'$   GI: Vomiting'[ ]'$ ; Dysphagia'[ ]'$ ; Melena'[ ]'$ ; Hematochezia '[ ]'$ ; Heartburn'[ ]'$ ; Abdominal pain '[ ]'$ ; Constipation '[ ]'$ ; Diarrhea [  ]; BRBPR '[ ]'$   GU: Hematuria'[ ]'$ ; Dysuria '[ ]'$ ; Nocturia'[ ]'$   Vascular: Pain in legs with walking '[ ]'$ ; Pain in feet with lying flat '[ ]'$ ; Non-healing sores '[ ]'$ ; Stroke '[ ]'$ ; TIA '[ ]'$ ; Slurred speech '[ ]'$ ;  Neuro: Headaches'[ ]'$ ; Vertigo'[ ]'$ ; Seizures'[ ]'$ ; Paresthesias'[ ]'$ ;Blurred vision '[ ]'$ ; Diplopia '[ ]'$ ; Vision changes '[ ]'$   Ortho/Skin: Arthritis '[ ]'$ ; Joint pain '[ ]'$ ; Muscle pain '[ ]'$ ; Joint swelling '[ ]'$ ; Back Pain '[ ]'$ ; Rash '[ ]'$   Psych: Depression'[ ]'$ ; Anxiety'[ ]'$   Heme: Bleeding problems '[ ]'$ ; Clotting disorders '[ ]'$ ; Anemia '[ ]'$   Endocrine: Diabetes '[ ]'$ ; Thyroid dysfunction'[ ]'$   Home Medications Prior to Admission medications   Medication Sig Start Date End Date Taking? Authorizing Provider  atorvastatin (LIPITOR) 20 MG tablet Take 1 tablet (20 mg total) by mouth daily. 11/10/15  Yes Arnoldo Morale, MD  Blood Glucose Monitoring Suppl (TRUE METRIX METER) DEVI 1 each by Does not apply route 3 (three) times daily before meals. 11/10/15  Yes Arnoldo Morale, MD  carvedilol (COREG) 25 MG tablet TAKE 1 TABLET BY MOUTH 2 TIMES DAILY WITH A MEAL. 10/06/15  Yes Shaune Pascal Karlina Suares, MD  dolutegravir (TIVICAY) 50 MG tablet Take 1 tablet (50 mg total) by mouth daily. 10/05/15  Yes [facility] Riches, MD  emtricitabine-tenofovir AF (DESCOVY) 200-25 MG tablet Take 1 tablet by mouth daily. 10/05/15  Yes [facility] Riches, MD  furosemide (LASIX) 40 MG tablet TAKE 1 TABLET BY MOUTH ON MONDAYS & FRIDAYS 08/10/15  Yes [name] D Clegg, NP  glucose blood (TRUE METRIX BLOOD GLUCOSE TEST) test strip Use 3 times daily before meals 11/10/15  Yes Arnoldo Morale, MD  glucose monitoring kit (FREESTYLE) monitoring kit 1 each by Does not apply route 4 (four) times daily - after meals and at bedtime. 1 month Diabetic Testing Supplies for QAC-QHS accuchecks. 12/12/13  Yes Lorayne Marek, MD  hydrALAZINE (APRESOLINE) 50 MG tablet Take 2 tablets (100 mg total) by mouth 2 (two) times daily. 08/11/15  Yes [name] D Clegg, NP  HYDROcodone-acetaminophen (NORCO/VICODIN) 5-325 MG  tablet Take 1 tablet by mouth every 4 (four) hours as needed for pain. 11/04/15  Yes Historical Provider, MD  isosorbide mononitrate (IMDUR) 60 MG 24 hr tablet Take 1 tablet (60 mg total) by mouth daily. 03/08/15  Yes Jolaine Artist, MD  losartan (COZAAR) 100 MG tablet Take 1 tablet (100 mg total) by mouth daily. 03/08/15  Yes Jolaine Artist, MD  metFORMIN (GLUCOPHAGE) 500 MG tablet Take 2 tablets (1,000 mg total) by mouth 2 (two) times daily with a meal. 11/10/15  Yes Arnoldo Morale, MD  sacubitril-valsartan (ENTRESTO) 24-26 MG Take 1 tablet by mouth 2 (two) times daily. 09/21/15  Yes Jolaine Artist, MD  TRUEPLUS LANCETS 28G MISC 1 each by Does not apply route 3 (three) times daily before meals. 11/10/15  Yes Arnoldo Morale, MD    Past Medical History: Past Medical History:  Diagnosis Date  . Chronic systolic heart failure (HCC)    a. EF 15-20%, grade II DD, LA mild/mod dilated  . Diabetes mellitus without complication ([place])   . DM2 (diabetes mellitus, type 2) ([place])   . HIV (human immunodeficiency virus infection) ([place])   . Hypertension     Past Surgical History: Past Surgical History:  Procedure Laterality Date  . ANKLE SURGERY Right     Family History: Family History  Problem Relation Age of Onset  . Other Father   . Diabetes Mother   . Stroke Paternal Grandfather     Social History: Social History   Social History  . Marital status: Single    Spouse name: N/A  . Number of children: N/A  . Years of education: N/A   Social History Main Topics  . Smoking status: Current Every Day Smoker    Packs/day: 0.25    Types: Cigarettes  . Smokeless tobacco: Never Used     Comment: about 10 cigarettes a week  . Alcohol use 14.4 oz/week    12 Cans of beer, 12 Shots of liquor per week  . Drug use:     Types: Marijuana  . Sexual activity: Not Asked     Comment: pt. given condoms   Other Topics Concern  . None   Social History Narrative   Currently homeless     Allergies:  Allergies  Allergen Reactions  . Lisinopril Cough    Objective:    Vital Signs:   Temp:  [98.6 F (37 C)] 98.6 F (37 C) (10/09 0841) Pulse Rate:  [108-119] 119 (10/09 1215) Resp:  [14-23] 14 (10/09 1215) BP: (163-185)/(93-126) 172/126 (10/09 1215) SpO2:  [94 %-100 %] 99 % (10/09 1215)    Weight change: There were no vitals filed for this visit.  Intake/Output:   Intake/Output Summary (Last 24 hours) at 01/17/16 1348 Last data filed at 01/17/16 1219  Gross per 24 hour  Intake  50 ml  Output                0 ml  Net               50 ml     Physical Exam: General:  Fatigued appearing. Warm. + cough HEENT: Normal Neck: supple. JVP difficult to assess, appears 8-9 cm. Carotids 2+ bilat; no bruits. No thyromegaly or nodule noted.  Cor: PMI nondisplaced. Regular rate & rhythm. No rubs, gallops or murmurs. Lungs: Diminished basilar sounds, slight crackles in R base Abdomen: Obese, soft, NT, ND, no HSM. No bruits or masses. +BS  Extremities: no cyanosis, clubbing, rash. Trace ankle edema at most.  Neuro: alert & orientedx3, cranial nerves grossly intact. moves all 4 extremities w/o difficulty. Affect pleasant  Telemetry: Not connected  Labs: Basic Metabolic Panel:  Recent Labs Lab 01/17/16 0849  NA 135  K 4.0  CL 98*  CO2 24  GLUCOSE 354*  BUN 11  CREATININE 1.10  CALCIUM 9.2    Liver Function Tests:  Recent Labs Lab 01/17/16 0849  AST 28  ALT 25  ALKPHOS 82  BILITOT 0.5  PROT 7.6  ALBUMIN 3.6   No results for input(s): LIPASE, AMYLASE in the last 168 hours. No results for input(s): AMMONIA in the last 168 hours.  CBC:  Recent Labs Lab 01/17/16 0849  WBC 10.0  NEUTROABS 7.4  HGB 13.7  HCT 40.0  MCV 97.1  PLT 131*    Cardiac Enzymes: No results for input(s): CKTOTAL, CKMB, CKMBINDEX, TROPONINI in the last 168 hours.  BNP: BNP (last 3 results)  Recent Labs  01/17/16 0849  BNP 66.5    ProBNP (last 3  results) No results for input(s): PROBNP in the last 8760 hours.   CBG: No results for input(s): GLUCAP in the last 168 hours.  Coagulation Studies: No results for input(s): LABPROT, INR in the last 72 hours.  Other results: EKG: sinus tach 110  Imaging: Dg Chest Portable 1 View  Result Date: 01/17/2016 CLINICAL DATA:  Shortness of breath and cough for 10 days EXAM: PORTABLE CHEST 1 VIEW COMPARISON:  June 14, 2015 FINDINGS: There is no edema or consolidation. There are scattered tiny granulomas throughout the lungs bilaterally. The heart size and pulmonary vascularity are normal. No adenopathy. There is a calcified azygos lymph node. No bone lesions. IMPRESSION: Evidence prior granulomatous disease. No edema or consolidation. Stable cardiac silhouette. Electronically Signed   By: Lowella Grip III M.D.   On: 01/17/2016 09:00      Medications:     Current Medications: . atorvastatin  20 mg Oral q1800  . carvedilol  25 mg Oral BID WC  . dolutegravir  50 mg Oral Daily  . emtricitabine-tenofovir AF  1 tablet Oral Daily  . enoxaparin (LOVENOX) injection  40 mg Subcutaneous Q24H  . folic acid  1 mg Oral Daily  . guaiFENesin  600 mg Oral BID  . hydrALAZINE  100 mg Oral BID  . insulin aspart  0-15 Units Subcutaneous TID WC  . insulin aspart  0-5 Units Subcutaneous QHS  . ipratropium-albuterol  3 mL Nebulization Once  . isosorbide mononitrate  60 mg Oral Daily  . multivitamin with minerals  1 tablet Oral Daily  . sacubitril-valsartan  1 tablet Oral BID  . thiamine  100 mg Oral Daily   Or  . thiamine  100 mg Intravenous Daily     Infusions:      Assessment   1.  Cough and SOB 2. Chronic systolic HF - Echo 10/261 LVEF 30-35% 3. HTN  4. HIV - CD4 count 420 in 09/2015, viral load undetectable 5. Tobacco abuse 6. Alcohol Abuse 7. DM2  Plan    Volume status looks stable. BNP not elevated. Creatinine stable.  Takes lasix at home on Mondays and Fridays. Add Daily  weights.   Agree with repeat Echo.   On IV Azithro + Levaquin per ID (Rocephin avoided with QTc prolongation) for possible PNA s/p URI.  Per primary. Lungs with mild crackles but no wheezing currently. Encouraged to stop smoking.   BP remains in 160s this afternoon, though at time of my exam hadn't gotten meds yet with wait in ED this am.  Will see how he responds prior to up-titrating. Room to go up on Entresto if tolerated.   Length of Stay: 0  Shirley Friar PA-C 01/17/2016, 1:48 PM  Advanced Heart Failure Team Pager [phone] (M-F; 7a - 4p)  Please contact [facility] for night-coverage after hours (4p -7a ) and weekends on [email or web address]  Patient seen and examined with Oda Kilts, PA-C. We discussed all aspects of the encounter. I agree with the assessment and plan as stated above.   I have reviewed CXR personally. I suspect he has RLL PNA. Volume status currently looks fine. Agree with IV abx. Can hold diuretics for now. Restart as needed. BP is up in setting of not getting meds yet. Will follow closely. Will repeat echo. Watch closely for low output symptoms in setting of systemic illness. We will follow as needed.   Sandip Power,MD 11:53 PM

## 2016-01-17 NOTE — Progress Notes (Signed)
Pharmacy Antibiotic Note  Shaun Brennan is a 40 y.o. male admitted on 01/17/2016 with CAP.  Pharmacy has been consulted for Zosyn dosing.  Currently afebrile, WBC wnl.  Plan: Zosyn 3.375g IV q8h (4 hour infusion).  Monitor clinical progress, c/s, renal function, abx plan/LOT    Temp (24hrs), Avg:98.9 F (37.2 C), Min:98.6 F (37 C), Max:99.1 F (37.3 C)   Recent Labs Lab 01/17/16 0849  WBC 10.0  CREATININE 1.10    CrCl cannot be calculated (Unknown ideal weight.).    Allergies  Allergen Reactions  . Lisinopril Cough    Antimicrobials this admission: 10/9 Zosyn>>  Microbiology results: 10/9 Resp panel>>   Thank you for allowing [place] to participate in this patients care. Signe Colt, PharmD Pager: [phone] 01/17/2016 2:14 PM

## 2016-01-18 DIAGNOSIS — I10 Essential (primary) hypertension: Secondary | ICD-10-CM

## 2016-01-18 LAB — BASIC METABOLIC PANEL
ANION GAP: 10 (ref 5–15)
BUN: 10 mg/dL (ref 6–20)
CALCIUM: 9 mg/dL (ref 8.9–10.3)
CO2: 29 mmol/L (ref 22–32)
CREATININE: 0.97 mg/dL (ref 0.61–1.24)
Chloride: 94 mmol/L — ABNORMAL LOW (ref 101–111)
GLUCOSE: 200 mg/dL — AB (ref 65–99)
Potassium: 3.3 mmol/L — ABNORMAL LOW (ref 3.5–5.1)
Sodium: 133 mmol/L — ABNORMAL LOW (ref 135–145)

## 2016-01-18 LAB — HIV-1 RNA QUANT-NO REFLEX-BLD
HIV 1 RNA Quant: 11200 copies/mL
LOG10 HIV-1 RNA: 4.049 log10copy/mL

## 2016-01-18 LAB — RESPIRATORY PANEL BY PCR
Adenovirus: NOT DETECTED
BORDETELLA PERTUSSIS-RVPCR: NOT DETECTED
Chlamydophila pneumoniae: NOT DETECTED
Coronavirus 229E: NOT DETECTED
Coronavirus HKU1: NOT DETECTED
Coronavirus NL63: NOT DETECTED
Coronavirus OC43: NOT DETECTED
INFLUENZA B-RVPPCR: NOT DETECTED
Influenza A: NOT DETECTED
METAPNEUMOVIRUS-RVPPCR: NOT DETECTED
Mycoplasma pneumoniae: NOT DETECTED
PARAINFLUENZA VIRUS 2-RVPPCR: NOT DETECTED
PARAINFLUENZA VIRUS 3-RVPPCR: NOT DETECTED
Parainfluenza Virus 1: NOT DETECTED
Parainfluenza Virus 4: NOT DETECTED
RESPIRATORY SYNCYTIAL VIRUS-RVPPCR: NOT DETECTED
RHINOVIRUS / ENTEROVIRUS - RVPPCR: DETECTED — AB

## 2016-01-18 LAB — GLUCOSE, CAPILLARY
GLUCOSE-CAPILLARY: 282 mg/dL — AB (ref 65–99)
Glucose-Capillary: 227 mg/dL — ABNORMAL HIGH (ref 65–99)
Glucose-Capillary: 255 mg/dL — ABNORMAL HIGH (ref 65–99)
Glucose-Capillary: 277 mg/dL — ABNORMAL HIGH (ref 65–99)

## 2016-01-18 LAB — T-HELPER CELLS (CD4) COUNT (NOT AT ARMC)
CD4 % Helper T Cell: 37 % (ref 33–55)
CD4 T CELL ABS: 340 /uL — AB (ref 400–2700)

## 2016-01-18 LAB — LEGIONELLA PNEUMOPHILA SEROGP 1 UR AG: L. pneumophila Serogp 1 Ur Ag: NEGATIVE

## 2016-01-18 LAB — TROPONIN I
TROPONIN I: 0.04 ng/mL — AB (ref <0.03)
TROPONIN I: 0.16 ng/mL — AB (ref <0.03)
Troponin I: 0.06 ng/mL (ref <0.03)

## 2016-01-18 LAB — HEMOGLOBIN A1C
HEMOGLOBIN A1C: 10.3 % — AB (ref 4.8–5.6)
Mean Plasma Glucose: 249 mg/dL

## 2016-01-18 MED ORDER — ATORVASTATIN CALCIUM 40 MG PO TABS
40.0000 mg | ORAL_TABLET | Freq: Every day | ORAL | Status: DC
  Administered 2016-01-18 – 2016-01-19 (×2): 40 mg via ORAL
  Filled 2016-01-18 (×2): qty 1

## 2016-01-18 MED ORDER — ACETAMINOPHEN 325 MG PO TABS
650.0000 mg | ORAL_TABLET | Freq: Once | ORAL | Status: AC
  Administered 2016-01-18: 650 mg via ORAL
  Filled 2016-01-18: qty 2

## 2016-01-18 MED ORDER — ASPIRIN EC 81 MG PO TBEC
81.0000 mg | DELAYED_RELEASE_TABLET | Freq: Every day | ORAL | Status: DC
  Administered 2016-01-18 – 2016-01-20 (×3): 81 mg via ORAL
  Filled 2016-01-18 (×3): qty 1

## 2016-01-18 MED ORDER — POTASSIUM CHLORIDE CRYS ER 20 MEQ PO TBCR
20.0000 meq | EXTENDED_RELEASE_TABLET | Freq: Two times a day (BID) | ORAL | Status: DC
  Administered 2016-01-18 – 2016-01-19 (×3): 20 meq via ORAL
  Filled 2016-01-18 (×3): qty 1

## 2016-01-18 MED ORDER — SACUBITRIL-VALSARTAN 49-51 MG PO TABS
1.0000 | ORAL_TABLET | Freq: Two times a day (BID) | ORAL | Status: DC
  Administered 2016-01-18 – 2016-01-20 (×4): 1 via ORAL
  Filled 2016-01-18 (×4): qty 1

## 2016-01-18 NOTE — Progress Notes (Signed)
Family Medicine Teaching Service Daily Progress Note Intern Pager: [phone]  Patient name: Shaun Brennan Medical record number: [id number] Date of birth: Mar 27, 1976 Age: 40 y.o. Gender: male  Primary Care Provider: Jaclyn ShaggyEnobong, Amao, MD Consultants: ID Code Status: FULL  Pt Overview and Major Events to Date:   Assessment and Plan: Sherlock Shaun Brennan is a 40 y.o. male presenting with shortness of breath and cough . PMH is significant for HIV, DM 2, CHF, hypertension, tobacco use disorder, major depression and hyperlipidemia  Shortness of breath/cough: Evaluation of symptoms suggestive for infectious etiology likely pneumonia after URI. Exam remarkable for crackles bilaterally over low lung fields. Chest x-ray with increased opacity over lower lung fields although it was read negative for edema or consolidation. Patient's is immunocompromised with HIV and diabetes. He has chest pain but this is only when she coughs. Unlikely ACS with no significant changes on his EKG and negative troponin. Chest pain is also not exertional.  -s/p a dose of ceftriaxone and azithromycin -Levaquin  Day 1 -DuoNeb every 4 hours as needed -Mucinex 600 mg bid -RVP pending  -Urine strep negative, legionella antigen pending -Cycle troponin, although low suspicion for ACS at this time -Oxygen as needed  HIV:  CD4 420 and undetectable viral RNA on 09/21/2015. On Tivicay and Descovy at home. Reports good compliance with his medications. He is followed by Dr. Ninetta LightsHatcher from [facility]. -c/s ID, appreciate recs -HIV, CD4 and viral load pending -Continue home medications  Hypertension: Patient with hypertensive urgency on arrival. No end organ damage at this time.  -Continue home meds as above -Continue home hydralazine  HFrEF: Last echo in 11/2014 with ejection fraction of 30-35%, severely dilated cavity and G2DD. Last visit to cardiology office appears to be 09/08/2015. BNP 66 on admission. No sign of fluid overload. Some  crackles over lower lung fields but chest x-ray without edema. -Continue home meds (coreg, Imdur, lasix and Entresto). Hold losartan. -Consider echocardiogram -c/s to heart failure, appreciate recs  DM-2: Last A1c 9.6 on 09/21/2015. Blood glucose elevated to mid 300s on admission. Blood work not concerning for DKA or HHS. -SSI-intermediate -A1c pending  Hyperlipidemia: Last lipid panel in 09/2015. 26.6% risk for ASCVD -Lipid panel LDL 149 -Start Aspirin 81 mg and Lipitor 40 mg  Major depression: Stable.  Doesn't appear to be on medication. Denies suicidal ideation.  Tobacco use disorder: Reports smoking 2-3 cigarettes a day. Declines nicotine patch.  Alcohol use: Reports drinking up to a pack over the weekends. Recent drink 2 or the 40 ounce beers this weekend. -CIWA  FEN/GI:  -Regular diet  -Saline lock  Prophylaxis: Lovenox   Disposition: Pending clinical improvement.  Subjective:  Patient laying in hospital bed and tired-appearing.  States he feels the same as he did before.  Chest pain when he coughs.  No other complaints at this time.  Objective: Temp:  [97.3 F (36.3 C)-99.1 F (37.3 C)] 97.9 F (36.6 C) (10/10 1539) Pulse Rate:  [79-98] 79 (10/10 1539) Resp:  [16-18] 16 (10/10 1539) BP: (125-174)/(80-116) 165/99 (10/10 1539) SpO2:  [98 %-100 %] 98 % (10/10 1539) Weight:  [196 lb 10.4 oz (89.2 kg)-199 lb 1.2 oz (90.3 kg)] 196 lb 10.4 oz (89.2 kg) (10/10 0639)  GEN: obese, laying in hospital bed, some distress from cough  HEENT:  no conjunctival injection, MMM without erythema or exudate CVS: RRR, normal s1 and s2, no murmurs, no edema RESP: mildly increased work of breathing, good air movement bilaterally, some crackles over lower lung fields  bilaterally, no wheezing noted GI: Obese, soft, NT ND, +bs NEURO: alert and oriented appropriately, no gross deficits  PSYCH: appropriate mood and affect  Laboratory:  Recent Labs Lab 01/17/16 0849  WBC 10.0   HGB 13.7  HCT 40.0  PLT 131*    Recent Labs Lab 01/17/16 0849 01/18/16 0047  NA 135 133*  K 4.0 3.3*  CL 98* 94*  CO2 24 29  BUN 11 10  CREATININE 1.10 0.97  CALCIUM 9.2 9.0  PROT 7.6  --   BILITOT 0.5  --   ALKPHOS 82  --   ALT 25  --   AST 28  --   GLUCOSE 354* 200*   Imaging/Diagnostic Tests: Dg Chest 2 View  Result Date: 01/17/2016 CLINICAL DATA:  Ten days of productive cough and shortness of breath. Coughing worse when lying down. Patient also reports fatigue. Current smoker, history of diabetes and hypertension. Also history of HIV, CHF EXAM: CHEST  2 VIEW COMPARISON:  Portable chest x-ray of January 17, 2016 and PA and lateral chest x-ray of June 14, 2015. FINDINGS: The heart size and mediastinal contours are within normal limits. Both lungs are clear. The visualized skeletal structures are unremarkable. IMPRESSION: Patchy parenchymal density bilaterally worrisome for early pneumonia. Followup PA and lateral chest X-ray is recommended in 3-4 weeks following trial of antibiotic therapy to ensure resolution and exclude underlying malignancy. Previous granulomatous infection.  No evidence of CHF. Electronically Signed   By: David  [place] M.D.   On: 01/17/2016 16:54   Freddrick March, MD 01/18/2016, 4:22 PM PGY-1, [facility] Family Medicine FPTS Intern pager: [phone], text pages welcome

## 2016-01-18 NOTE — Progress Notes (Signed)
Inpatient Diabetes Program Recommendations  AACE/ADA: New Consensus Statement on Inpatient Glycemic Control (2015)  Target Ranges:  Prepandial:   less than 140 mg/dL      Peak postprandial:   less than 180 mg/dL (1-2 hours)      Critically ill patients:  140 - 180 mg/dL   Lab Results  Component Value Date   GLUCAP 255 (H) 01/18/2016   HGBA1C 10.3 (H) 01/17/2016    Review of Glycemic Control  Diabetes history: DM 2 Outpatient Diabetes medications: Metformin 1000 mg BID Current orders for Inpatient glycemic control: Novolog Moderate + HS scale  Inpatient Diabetes Program Recommendations:   A1c 10.3% obtained on 01/17/16. Patient only on Metformin at home. May want to consider another oral medication at d/c. Due to insurance status consider glucotrol.  Thanks,   Christena Deem RN, MSN, [facility] Inpatient Diabetes Coordinator Team Pager [phone] (8a-5p)

## 2016-01-18 NOTE — Progress Notes (Signed)
Advanced Heart Failure Rounding Note  Referring Physician: Dr. Randolm IdolFletke Primary Physician: Dr. Odette HornsEnobong Primary Cardiologist:  Dr. Gala RomneyBensimhon   Reason for Consultation: SOB, history of Systolic EF  Subjective:    2 View Chest with bilaterally patchy density concerning for early PNA.   Relatively stable from HF perspective.  BP remains elevated. Echo pending.  Feels "About the same" today.  Continues to cough with general malaise. TMax 99.1.  Objective:   Weight Range: 196 lb 10.4 oz (89.2 kg) Body mass index is 32.72 kg/m.   Vital Signs:   Temp:  [97.3 F (36.3 C)-99.1 F (37.3 C)] 97.9 F (36.6 C) (10/10 1216) Pulse Rate:  [82-106] 85 (10/10 1216) Resp:  [17-20] 18 (10/10 1216) BP: (125-174)/(80-116) 125/86 (10/10 1216) SpO2:  [98 %-100 %] 100 % (10/10 1216) Weight:  [196 lb 10.4 oz (89.2 kg)-199 lb 1.2 oz (90.3 kg)] 196 lb 10.4 oz (89.2 kg) (10/10 0639) Last BM Date: 01/17/16  Weight change: Filed Weights   01/17/16 1841 01/18/16 0639  Weight: 199 lb 1.2 oz (90.3 kg) 196 lb 10.4 oz (89.2 kg)    Intake/Output:   Intake/Output Summary (Last 24 hours) at 01/18/16 1254 Last data filed at 01/18/16 1149  Gross per 24 hour  Intake             9.28 ml  Output              600 ml  Net          -590.72 ml     Physical Exam: General:  Fatigued appearing. Ongoing cough.  HEENT: Normal Neck: supple. JVP 8-9 cm.  Carotids 2+ bilat; no bruits. No thyromegaly or nodule noted.  Cor: PMI nondisplaced. RRR. No M/G/R Lungs: Diminished basilar sounds R>L Abdomen: Obese, soft, NT, ND, no HSM. No bruits or masses. +BS  Extremities: no cyanosis, clubbing, rash. Trace ankle edema Neuro: alert & orientedx3, cranial nerves grossly intact. moves all 4 extremities w/o difficulty. Affect pleasant  Telemetry: Reviewed, NSR 80s  Labs: CBC  Recent Labs  01/17/16 0849  WBC 10.0  NEUTROABS 7.4  HGB 13.7  HCT 40.0  MCV 97.1  PLT 131*   Basic Metabolic Panel  Recent  Labs  16/01/9609/09/17 0849 01/18/16 0047  NA 135 133*  K 4.0 3.3*  CL 98* 94*  CO2 24 29  GLUCOSE 354* 200*  BUN 11 10  CREATININE 1.10 0.97  CALCIUM 9.2 9.0   Liver Function Tests  Recent Labs  01/17/16 0849  AST 28  ALT 25  ALKPHOS 82  BILITOT 0.5  PROT 7.6  ALBUMIN 3.6   No results for input(s): LIPASE, AMYLASE in the last 72 hours. Cardiac Enzymes  Recent Labs  01/17/16 1406 01/17/16 1901 01/18/16 0047  TROPONINI 0.05* 0.05* 0.06*    BNP: BNP (last 3 results)  Recent Labs  01/17/16 0849  BNP 66.5    ProBNP (last 3 results) No results for input(s): PROBNP in the last 8760 hours.   D-Dimer No results for input(s): DDIMER in the last 72 hours. Hemoglobin A1C No results for input(s): HGBA1C in the last 72 hours. Fasting Lipid Panel  Recent Labs  01/17/16 1406  CHOL 230*  HDL 61  LDLCALC 149*  TRIG 101  CHOLHDL 3.8   Thyroid Function Tests No results for input(s): TSH, T4TOTAL, T3FREE, THYROIDAB in the last 72 hours.  Invalid input(s): FREET3  Other results:     Imaging/Studies:  Dg Chest 2 View  Result Date:  01/17/2016 CLINICAL DATA:  Ten days of productive cough and shortness of breath. Coughing worse when lying down. Patient also reports fatigue. Current smoker, history of diabetes and hypertension. Also history of HIV, CHF EXAM: CHEST  2 VIEW COMPARISON:  Portable chest x-ray of January 17, 2016 and PA and lateral chest x-ray of June 14, 2015. FINDINGS: The heart size and mediastinal contours are within normal limits. Both lungs are clear. The visualized skeletal structures are unremarkable. IMPRESSION: Patchy parenchymal density bilaterally worrisome for early pneumonia. Followup PA and lateral chest X-ray is recommended in 3-4 weeks following trial of antibiotic therapy to ensure resolution and exclude underlying malignancy. Previous granulomatous infection.  No evidence of CHF. Electronically Signed   By: David  [place] M.D.   On: 01/17/2016  16:54   Dg Chest Portable 1 View  Result Date: 01/17/2016 CLINICAL DATA:  Shortness of breath and cough for 10 days EXAM: PORTABLE CHEST 1 VIEW COMPARISON:  June 14, 2015 FINDINGS: There is no edema or consolidation. There are scattered tiny granulomas throughout the lungs bilaterally. The heart size and pulmonary vascularity are normal. No adenopathy. There is a calcified azygos lymph node. No bone lesions. IMPRESSION: Evidence prior granulomatous disease. No edema or consolidation. Stable cardiac silhouette. Electronically Signed   By: Bretta Bang III M.D.   On: 01/17/2016 09:00     Latest Echo  Latest Cath   Medications:     Scheduled Medications: . aspirin EC  81 mg Oral Daily  . atorvastatin  40 mg Oral q1800  . carvedilol  25 mg Oral BID WC  . dolutegravir  50 mg Oral Daily  . emtricitabine-tenofovir AF  1 tablet Oral Daily  . enoxaparin (LOVENOX) injection  40 mg Subcutaneous Q24H  . folic acid  1 mg Oral Daily  . guaiFENesin  600 mg Oral BID  . hydrALAZINE  100 mg Oral BID  . insulin aspart  0-15 Units Subcutaneous TID WC  . insulin aspart  0-5 Units Subcutaneous QHS  . isosorbide mononitrate  60 mg Oral Daily  . levofloxacin (LEVAQUIN) IV  500 mg Intravenous Q24H  . multivitamin with minerals  1 tablet Oral Daily  . potassium chloride  20 mEq Oral BID  . sacubitril-valsartan  1 tablet Oral BID  . thiamine  100 mg Oral Daily   Or  . thiamine  100 mg Intravenous Daily     Infusions:     PRN Medications:  ipratropium-albuterol, LORazepam **OR** LORazepam   Assessment   1. Cough and SOB 2. Chronic systolic HF - Echo 11/2014 LVEF 30-35% 3. HTN  4. HIV - CD4 count 420 in 09/2015, viral load undetectable 5. Tobacco abuse 6. Alcohol Abuse 7. DM2  Plan    Volume status remains stable.  Creatinine stable.    BP remains into 170s this am. Increase Entresto 49/51 mg BID.   ABX per primary team.   Length of Stay: 1  Graciella Freer  PA-C 01/18/2016, 12:54 PM  Advanced Heart Failure Team Pager [phone] (M-F; 7a - 4p)  Please contact [facility] for night-coverage after hours (4p -7a ) and weekends on [email or web address]   Patient seen and examined with Otilio Saber, PA-C. We discussed all aspects of the encounter. I agree with the assessment and plan as stated above.   Volume status looks ok. BP high. Agree with increasing Entresto. Treatment of CAP per primary team.   Bensimhon, Daniel,MD 6:56 PM

## 2016-01-18 NOTE — Progress Notes (Signed)
Patient went into 2nd degree heart block. Asymptomatic, was sleeping didn't feel any different. Vitals signs being taken, will continue to monitor.

## 2016-01-18 NOTE — Progress Notes (Signed)
Notified MD of elevated troponin: 0.06 and low potassium: 3.3. MD ordered EKG and potassium supplement.

## 2016-01-19 ENCOUNTER — Inpatient Hospital Stay (HOSPITAL_COMMUNITY): Payer: Self-pay

## 2016-01-19 DIAGNOSIS — I509 Heart failure, unspecified: Secondary | ICD-10-CM

## 2016-01-19 LAB — CBC
HEMATOCRIT: 38.4 % — AB (ref 39.0–52.0)
HEMOGLOBIN: 13.7 g/dL (ref 13.0–17.0)
MCH: 33.3 pg (ref 26.0–34.0)
MCHC: 35.7 g/dL (ref 30.0–36.0)
MCV: 93.2 fL (ref 78.0–100.0)
Platelets: 134 10*3/uL — ABNORMAL LOW (ref 150–400)
RBC: 4.12 MIL/uL — AB (ref 4.22–5.81)
RDW: 12.6 % (ref 11.5–15.5)
WBC: 4.4 10*3/uL (ref 4.0–10.5)

## 2016-01-19 LAB — ECHOCARDIOGRAM COMPLETE
CHL CUP DOP CALC LVOT VTI: 13.3 cm
CHL CUP STROKE VOLUME: 49 mL
E decel time: 246 msec
EERAT: 10.33
FS: 11 % — AB (ref 28–44)
Height: 65 in
IVS/LV PW RATIO, ED: 0.77
LADIAMINDEX: 1.7 cm/m2
LASIZE: 35 mm
LAVOL: 38.8 mL
LAVOLA4C: 36.6 mL
LAVOLIN: 18.8 mL/m2
LDCA: 3.8 cm2
LEFT ATRIUM END SYS DIAM: 35 mm
LV PW d: 12.9 mm — AB (ref 0.6–1.1)
LV SIMPSON'S DISK: 39
LV TDI E'MEDIAL: 4.03
LV dias vol index: 61 mL/m2
LV dias vol: 126 mL (ref 62–150)
LV sys vol index: 38 mL/m2
LVEEAVG: 10.33
LVEEMED: 10.33
LVELAT: 5.98 cm/s
LVOT diameter: 22 mm
LVOT peak vel: 88.6 cm/s
LVOTSV: 51 mL
LVSYSVOL: 77 mL — AB (ref 21–61)
MV Dec: 246
MV pk A vel: 69.8 m/s
MVPKEVEL: 61.8 m/s
RV LATERAL S' VELOCITY: 14 cm/s
TAPSE: 20.9 mm
TDI e' lateral: 5.98
Weight: 3164.04 oz

## 2016-01-19 LAB — GLUCOSE, CAPILLARY
GLUCOSE-CAPILLARY: 381 mg/dL — AB (ref 65–99)
GLUCOSE-CAPILLARY: 244 mg/dL — AB (ref 65–99)
Glucose-Capillary: 217 mg/dL — ABNORMAL HIGH (ref 65–99)
Glucose-Capillary: 276 mg/dL — ABNORMAL HIGH (ref 65–99)

## 2016-01-19 LAB — TROPONIN I: TROPONIN I: 0.03 ng/mL — AB (ref <0.03)

## 2016-01-19 LAB — BASIC METABOLIC PANEL
Anion gap: 10 (ref 5–15)
BUN: 11 mg/dL (ref 6–20)
CALCIUM: 9.3 mg/dL (ref 8.9–10.3)
CO2: 26 mmol/L (ref 22–32)
CREATININE: 0.95 mg/dL (ref 0.61–1.24)
Chloride: 96 mmol/L — ABNORMAL LOW (ref 101–111)
GFR calc Af Amer: >60 mL/min (ref >60)
GLUCOSE: 248 mg/dL — AB (ref 65–99)
Potassium: 4.4 mmol/L (ref 3.5–5.1)
Sodium: 132 mmol/L — ABNORMAL LOW (ref 135–145)

## 2016-01-19 MED ORDER — HYDRALAZINE HCL 20 MG/ML IJ SOLN
5.0000 mg | INTRAMUSCULAR | Status: DC | PRN
  Administered 2016-01-19: 5 mg via INTRAVENOUS
  Filled 2016-01-19: qty 1

## 2016-01-19 MED ORDER — IBUPROFEN 600 MG PO TABS: 600.0000 mg | ORAL_TABLET | ORAL | Status: DC | PRN

## 2016-01-19 MED ORDER — ACETAMINOPHEN 325 MG PO TABS
650.0000 mg | ORAL_TABLET | Freq: Four times a day (QID) | ORAL | Status: DC | PRN
  Administered 2016-01-19: 650 mg via ORAL
  Filled 2016-01-19: qty 2

## 2016-01-19 MED ORDER — BENZONATATE 100 MG PO CAPS
200.0000 mg | ORAL_CAPSULE | Freq: Three times a day (TID) | ORAL | Status: DC | PRN
  Administered 2016-01-19 – 2016-01-20 (×2): 200 mg via ORAL
  Filled 2016-01-19 (×2): qty 2

## 2016-01-19 NOTE — Progress Notes (Signed)
Family Medicine Teaching Service Daily Progress Note Intern Pager: [phone]  Patient name: Shaun Brennan Medical record number: [id number] Date of birth: May 16, 1975 Age: 40 y.o. Gender: male  Primary Care Provider: Jaclyn Shaggy, MD Consultants: ID Code Status: FULL  Pt Overview and Major Events to Date:   Assessment and Plan: Garet Gegg is a 40 y.o. male presenting with shortness of breath and cough . PMH is significant for HIV, DM 2, CHF, hypertension, tobacco use disorder, major depression and hyperlipidemia  Shortness of breath/cough: Evaluation of symptoms suggestive for infectious etiology likely pneumonia after URI. Chest x-ray with increased opacity over lower lung fields although it was read negative for edema or consolidation. Patient's is immunocompromised with HIV and diabetes. -RVP + for Rhino/Enterovirus.  Urine strep negative, legionella antigen negative.   He has chest pain but this is only when she coughs. Cough has worsened this morning.  Last duoneb was Monday.   -Spoke with nurse to give Tessalon and duoneb this AM and will re-check -Levaquin Day 2 -continue Duoneb every 4 hours prn -Mucinex 600 mg bid -Oxygen as needed  HIV:  CD4 420 and undetectable viral RNA on 09/21/2015. On Tivicay and Descovy at home. Reports good compliance with his medications. He is followed by Dr. Ninetta Lights from [facility]. -HIV, CD4 340 and viral load 11,200  -Per ID recs, continue home medications -will need appointment made outpatient to address likely noncompliance  Hypertension: Patient with hypertensive urgency on arrival. No end organ damage at this time. BP this AM 173/110.  Hydralazine prn.  -Continue home meds - Entresto was increased 10/10 to 49-51 mg -Continue Imdur 60 mg -Continue home hydralazine 100 mg bid  HFrEF: Last echo in 11/2014 with ejection fraction of 30-35%, severely dilated cavity and G2DD. Last visit to cardiology office appears to be 09/08/2015. BNP 66 on  admission. No sign of fluid overload. Some crackles over lower lung fields but chest x-ray without edema. -Continue home meds (coreg, Imdur, lasix and Entresto). Hold losartan. -echocardiogram ordered, pending. -c/s to heart failure, appreciate recs  Second degree block on tele.  Patient without chest pain and asymptomatic.  Cards consulted to see if additional workup to do - Per cards, continue to follow but no changes to therapies at this time.  DM-2: Last A1c 9.6 on 09/21/2015. Blood glucose elevated to mid 300s on admission. Blood work not concerning for DKA or HHS. -SSI-intermediate -A1c pending  Hyperlipidemia: Last lipid panel in 09/2015. 26.6% risk for ASCVD -Lipid panel LDL 149 -Start Aspirin 81 mg and Lipitor 40 mg  Major depression: Stable.  Doesn't appear to be on medication. Denies suicidal ideation.  Tobacco use disorder: Reports smoking 2-3 cigarettes a day. Declines nicotine patch.  Alcohol use: Reports drinking up to a pack over the weekends. Recent drink 2 or the 40 ounce beers this weekend. -CIWA  FEN/GI:  -Regular diet  -Saline lock  Prophylaxis: Lovenox   Disposition: Pending clinical improvement.  Subjective:  Patient laying in hospital bed and tired-appearing.  States his cough has worsened since yesterday.  Some increased work of breathing this morning.  States his last duoneb was on Monday.  Otherwise no complaints.    Objective: Temp:  [97.9 F (36.6 C)-98.6 F (37 C)] 98.4 F (36.9 C) (10/11 1422) Pulse Rate:  [79-93] 85 (10/11 1422) Resp:  [16-18] 18 (10/11 1422) BP: (136-174)/(84-110) 136/84 (10/11 1422) SpO2:  [98 %-100 %] 100 % (10/11 1422) Weight:  [197 lb 12 oz (89.7 kg)] 197 lb 12  oz (89.7 kg) (10/11 0546)  GEN: obese, laying in hospital bed, some distress from cough HEENT:  no conjunctival injection, MMM without erythema or exudate CVS: RRR, normal s1 and s2, no murmurs, no edema RESP: mildly increased work of breathing, still  with good air movement bilaterally, slight wheezing noted GI: Obese, soft, NT ND, +bs NEURO: alert and oriented appropriately, no gross deficits  PSYCH: appropriate mood and affect  Laboratory:  Recent Labs Lab 01/17/16 0849 01/19/16 0530  WBC 10.0 4.4  HGB 13.7 13.7  HCT 40.0 38.4*  PLT 131* 134*    Recent Labs Lab 01/17/16 0849 01/18/16 0047 01/19/16 0530  NA 135 133* 132*  K 4.0 3.3* 4.4  CL 98* 94* 96*  CO2 24 29 26   BUN 11 10 11   CREATININE 1.10 0.97 0.95  CALCIUM 9.2 9.0 9.3  PROT 7.6  --   --   BILITOT 0.5  --   --   ALKPHOS 82  --   --   ALT 25  --   --   AST 28  --   --   GLUCOSE 354* 200* 248*   Imaging/Diagnostic Tests: No results found. Freddrick MarchYashika Jozsef Wescoat, MD 01/19/2016, 3:07 PM PGY-1, [facility] FPTS Intern pager: [phone], text pages welcome

## 2016-01-19 NOTE — Progress Notes (Signed)
Advanced Heart Failure Rounding Note  Referring Physician: Dr. Randolm Idol Primary Physician: Dr. Odette Horns Primary Cardiologist:  Dr. Gala Romney   Reason for Consultation: SOB, history of Systolic EF  Subjective:    2 View Chest with bilaterally patchy density concerning for early PNA.   Relatively stable from HF perspective.  BP in 170s this am prior to meds, down into 120s yesterday after meds. Echo still pending.  Says he is coughing more today. Feels worse, but promptly falls asleep DURING my exam.   Afebrile. WBC 4.4.  Objective:   Weight Range: 197 lb 12 oz (89.7 kg) Body mass index is 32.91 kg/m.   Vital Signs:   Temp:  [97.9 F (36.6 C)-98.6 F (37 C)] 98.5 F (36.9 C) (10/11 0546) Pulse Rate:  [79-93] 82 (10/11 0546) Resp:  [16-18] 18 (10/11 0546) BP: (125-174)/(86-110) 173/110 (10/11 0546) SpO2:  [98 %-100 %] 100 % (10/11 0546) Weight:  [197 lb 12 oz (89.7 kg)] 197 lb 12 oz (89.7 kg) (10/11 0546) Last BM Date: 01/17/16  Weight change: Filed Weights   01/17/16 1841 01/18/16 0639 01/19/16 0546  Weight: 199 lb 1.2 oz (90.3 kg) 196 lb 10.4 oz (89.2 kg) 197 lb 12 oz (89.7 kg)    Intake/Output:   Intake/Output Summary (Last 24 hours) at 01/19/16 1046 Last data filed at 01/18/16 2337  Gross per 24 hour  Intake              100 ml  Output              600 ml  Net             -500 ml     Physical Exam: General:  Fatigued appearing. Cough.  HEENT: Normal Neck: supple. JVP 7-8 cm.  Carotids 2+ bilat; no bruits. No thyromegaly or nodule noted.  Cor: PMI nondisplaced. RRR. No M/G/R Lungs: Diminished sounds.  Abdomen: Obese, soft, NT, ND, no HSM. No bruits or masses. +BS  Extremities: no cyanosis, clubbing, rash. Trace ankle edema.  Neuro: alert & orientedx3, cranial nerves grossly intact. moves all 4 extremities w/o difficulty. Affect pleasant  Telemetry: Reviewed, NSR 80s, Noted to drop 2 beats around 1525 yesterday  Labs: CBC  Recent Labs   01/17/16 0849 01/19/16 0530  WBC 10.0 4.4  NEUTROABS 7.4  --   HGB 13.7 13.7  HCT 40.0 38.4*  MCV 97.1 93.2  PLT 131* 134*   Basic Metabolic Panel  Recent Labs  01/18/16 0047 01/19/16 0530  NA 133* 132*  K 3.3* 4.4  CL 94* 96*  CO2 29 26  GLUCOSE 200* 248*  BUN 10 11  CREATININE 0.97 0.95  CALCIUM 9.0 9.3   Liver Function Tests  Recent Labs  01/17/16 0849  AST 28  ALT 25  ALKPHOS 82  BILITOT 0.5  PROT 7.6  ALBUMIN 3.6   No results for input(s): LIPASE, AMYLASE in the last 72 hours. Cardiac Enzymes  Recent Labs  01/18/16 1210 01/18/16 1843 01/18/16 2348  TROPONINI 0.04* 0.16* 0.03*    BNP: BNP (last 3 results)  Recent Labs  01/17/16 0849  BNP 66.5    ProBNP (last 3 results) No results for input(s): PROBNP in the last 8760 hours.   D-Dimer No results for input(s): DDIMER in the last 72 hours. Hemoglobin A1C  Recent Labs  01/17/16 1406  HGBA1C 10.3*   Fasting Lipid Panel  Recent Labs  01/17/16 1406  CHOL 230*  HDL 61  LDLCALC 149*  TRIG 101  CHOLHDL 3.8   Thyroid Function Tests No results for input(s): TSH, T4TOTAL, T3FREE, THYROIDAB in the last 72 hours.  Invalid input(s): FREET3  Other results:     Imaging/Studies:  Dg Chest 2 View  Result Date: 01/17/2016 CLINICAL DATA:  Ten days of productive cough and shortness of breath. Coughing worse when lying down. Patient also reports fatigue. Current smoker, history of diabetes and hypertension. Also history of HIV, CHF EXAM: CHEST  2 VIEW COMPARISON:  Portable chest x-ray of January 17, 2016 and PA and lateral chest x-ray of June 14, 2015. FINDINGS: The heart size and mediastinal contours are within normal limits. Both lungs are clear. The visualized skeletal structures are unremarkable. IMPRESSION: Patchy parenchymal density bilaterally worrisome for early pneumonia. Followup PA and lateral chest X-ray is recommended in 3-4 weeks following trial of antibiotic therapy to ensure  resolution and exclude underlying malignancy. Previous granulomatous infection.  No evidence of CHF. Electronically Signed   By: David  [place] M.D.   On: 01/17/2016 16:54    Latest Echo  Latest Cath   Medications:     Scheduled Medications: . aspirin EC  81 mg Oral Daily  . atorvastatin  40 mg Oral q1800  . carvedilol  25 mg Oral BID WC  . dolutegravir  50 mg Oral Daily  . emtricitabine-tenofovir AF  1 tablet Oral Daily  . enoxaparin (LOVENOX) injection  40 mg Subcutaneous Q24H  . folic acid  1 mg Oral Daily  . guaiFENesin  600 mg Oral BID  . hydrALAZINE  100 mg Oral BID  . insulin aspart  0-15 Units Subcutaneous TID WC  . insulin aspart  0-5 Units Subcutaneous QHS  . isosorbide mononitrate  60 mg Oral Daily  . levofloxacin (LEVAQUIN) IV  500 mg Intravenous Q24H  . multivitamin with minerals  1 tablet Oral Daily  . potassium chloride  20 mEq Oral BID  . sacubitril-valsartan  1 tablet Oral BID  . thiamine  100 mg Oral Daily   Or  . thiamine  100 mg Intravenous Daily    Infusions:    PRN Medications: acetaminophen, benzonatate, hydrALAZINE, ipratropium-albuterol, LORazepam **OR** LORazepam   Assessment   1. Cough and SOB 2. Chronic systolic HF - Echo 11/2014 LVEF 30-35% 3. HTN  4. HIV - CD4 count 420 in 09/2015, viral load undetectable 5. Tobacco abuse 6. Alcohol Abuse 7. DM2 8. Dropped beats (? II degree AV block)  Plan    Volume status stable. Weight stable. Creatinine   WNL. K 4.4 with supp yesterday.   BP 170s this am.  On my check in room 114/80 with manual cuff, 110 by palp.  No medication change.   ABX per primary team.  Continues with general malaise and cough.  HF stable.   Had two dropped beats yesterday labeled as II degree AV block.  Nothing since.  Will continue to follow but no changes to therapies at this time.   Length of Stay: 2  Graciella Freer PA-C 01/19/2016, 10:46 AM  Advanced Heart Failure Team Pager [phone] (M-F; 7a - 4p)   Please contact [facility] for night-coverage after hours (4p -7a ) and weekends on [email or web address]  Patient seen and examined with Otilio Saber, PA-C. We discussed all aspects of the encounter. I agree with the assessment and plan as stated above.   Overall better today. Still with cough but afebrile. Less distress. Volume status looks good. BP checked manually and is good. Echo reviewed personally EF  40-45%.  Stable from HF perspective. We will sign off. Will arrange f/u in HF Clinic. Treatment of PNA per primary team. D/w Dr. Randolm IdolFletke at the bedside.   Bensimhon, Daniel,MD 3:44 PM

## 2016-01-19 NOTE — Progress Notes (Signed)
Echocardiogram 2D Echocardiogram has been performed.  Shaun Brennan 01/19/2016, 3:24 PM

## 2016-01-19 NOTE — Progress Notes (Signed)
Inpatient Diabetes Program Recommendations  AACE/ADA: New Consensus Statement on Inpatient Glycemic Control (2015)  Target Ranges:  Prepandial:   less than 140 mg/dL      Peak postprandial:   less than 180 mg/dL (1-2 hours)      Critically ill patients:  140 - 180 mg/dL   Lab Results  Component Value Date   GLUCAP 276 (H) 01/19/2016   HGBA1C 10.3 (H) 01/17/2016    Spoke with patient about diabetes and home regimen for diabetes control. Patient reports that he is followed by [facility] for diabetes management and currently takes only Metformin as an outpatient for diabetes control. Patient reports that they taking metformin as prescribed. He also reports of just getting a glucose meter to check his glucose levels at home because he was not doing that. He last went to the clinic in August this year. Informed patient of A1c level 10.3%. Discussed importance of checking CBGs and maintaining good CBG control to prevent long-term and short-term complications. Explained how hyperglycemia leads to damage within blood vessels which lead to the common complications seen with uncontrolled diabetes. Spoke with patient about following up with the [facility] to possibly start another medication for glucose control. Patient agreed. Patient verbalized understanding of information discussed and has no further questions at this time related to diabetes.   Thanks,  Christena Deem RN, MSN, [facility] Inpatient Diabetes Coordinator Team Pager [phone] (8a-5p)

## 2016-01-20 DIAGNOSIS — R0602 Shortness of breath: Secondary | ICD-10-CM

## 2016-01-20 DIAGNOSIS — R05 Cough: Secondary | ICD-10-CM

## 2016-01-20 DIAGNOSIS — R059 Cough, unspecified: Secondary | ICD-10-CM

## 2016-01-20 LAB — BASIC METABOLIC PANEL
ANION GAP: 10 (ref 5–15)
BUN: 13 mg/dL (ref 6–20)
CALCIUM: 9.5 mg/dL (ref 8.9–10.3)
CO2: 24 mmol/L (ref 22–32)
Chloride: 99 mmol/L — ABNORMAL LOW (ref 101–111)
Creatinine, Ser: 0.84 mg/dL (ref 0.61–1.24)
GLUCOSE: 222 mg/dL — AB (ref 65–99)
Potassium: 4.1 mmol/L (ref 3.5–5.1)
SODIUM: 133 mmol/L — AB (ref 135–145)

## 2016-01-20 LAB — GLUCOSE, CAPILLARY
GLUCOSE-CAPILLARY: 286 mg/dL — AB (ref 65–99)
Glucose-Capillary: 191 mg/dL — ABNORMAL HIGH (ref 65–99)

## 2016-01-20 MED ORDER — LEVOFLOXACIN 500 MG PO TABS
500.0000 mg | ORAL_TABLET | Freq: Every day | ORAL | Status: DC
  Administered 2016-01-20: 500 mg via ORAL
  Filled 2016-01-20: qty 1

## 2016-01-20 MED ORDER — PREDNISONE 50 MG PO TABS: 50.0000 mg | ORAL_TABLET | Freq: Every day | ORAL | 0 refills | Status: DC

## 2016-01-20 MED ORDER — GUAIFENESIN ER 600 MG PO TB12: 600.0000 mg | ORAL_TABLET | Freq: Two times a day (BID) | ORAL | Status: DC

## 2016-01-20 MED ORDER — BENZONATATE 200 MG PO CAPS: 200.0000 mg | ORAL_CAPSULE | Freq: Three times a day (TID) | ORAL | 0 refills | Status: DC | PRN

## 2016-01-20 MED ORDER — PREDNISONE 50 MG PO TABS: 50.0000 mg | ORAL_TABLET | Freq: Every day | ORAL | Status: DC

## 2016-01-20 MED ORDER — SACUBITRIL-VALSARTAN 49-51 MG PO TABS: 1.0000 | ORAL_TABLET | Freq: Two times a day (BID) | ORAL | 0 refills | Status: DC

## 2016-01-20 MED ORDER — ASPIRIN 81 MG PO TBEC: 81.0000 mg | DELAYED_RELEASE_TABLET | Freq: Every day | ORAL | Status: DC

## 2016-01-20 MED ORDER — LEVOFLOXACIN 500 MG PO TABS
500.0000 mg | ORAL_TABLET | Freq: Every day | ORAL | 0 refills | Status: DC
Start: 2016-01-20 — End: 2016-04-25

## 2016-01-20 NOTE — Care Management Note (Signed)
Case Management Note  Patient Details  Name: Shaun Brennan MRN: [id number] Date of Birth: [birth year]  Subjective/Objective:            Independent patient from home, who lives with friend, admitted w/ PNA. 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 Ninetta Lights, is compliant with ART, viral load non detectable. Admitted for IV Abx.  PCP Dr Venetia Night        Action/Plan:  Patient provided with MATCH, explained use. Will DC to home today.    Expected Discharge Date:                  Expected Discharge Plan:  Home/Self Care  In-House Referral:  NA  Discharge planning Services  CM Consult, MATCH Program  Post Acute Care Choice:  NA Choice offered to:  NA  DME Arranged:  N/A DME Agency:  NA  HH Arranged:  NA HH Agency:  NA  Status of Service:  Completed, signed off  If discussed at Long Length of Stay Meetings, dates discussed:    Additional Comments:  Lawerance Sabal, RN 01/20/2016, 1:11 PM

## 2016-01-20 NOTE — Progress Notes (Signed)
Patient discharge teaching given, including activity, diet, follow-up appoints, and medications. Patient verbalized understanding of all discharge instructions. IV access was d/c'd. Vitals are stable. Skin is intact except as charted in most recent assessments. Pt to be escorted out by NT, bus pass given for transportation.

## 2016-01-20 NOTE — Discharge Instructions (Signed)
You were admitted to the hospital for cough and shortness of breath.  Your breathing status improved while here and you were getting antibiotics as well as duonebs as needed.  You also got a cardiac workup to make sure it was not due to your heart and that was normal.  Please continue to take your oral antibiotics as prescribed for 5 days, starting today.  Also, I am prescribing you Tessalon for your cough and a short course of Prednisone that I would like you to start tomorrow (for 5 days). We are glad you are feeling better!

## 2016-01-20 NOTE — Progress Notes (Signed)
Family Medicine Teaching Service Daily Progress Note Intern Pager: [phone]  Patient name: Shaun Brennan Medical record number: [id number] Date of birth: Jan 01, 1976 Age: 40 y.o. Gender: male  Primary Care Provider: Jaclyn Shaggy, MD Consultants: ID Code Status: FULL  Pt Overview and Major Events to Date:  Levaquin (10/10-)  Assessment and Plan: Shaun Brennan is a 40 y.o. male presenting with shortness of breath and cough . PMH is significant for HIV, DM 2, CHF, hypertension, tobacco use disorder, major depression and hyperlipidemia  Shortness of breath/cough: Evaluation of symptoms suggestive for infectious etiology likely pneumonia after URI. Chest x-ray with increased opacity over lower lung fields although it was read negative for edema or consolidation. Patient is immunocompromised with HIV and diabetes. RVP + for Rhino/Enterovirus.  Urine strep negative, legionella antigen negative. Chest pain when he coughs.  Cough has remained the same but patient overall improved.   -Levaquin Day 3 (dose not received yet today) --> plan to discharge home on 5 more days of Levaquin to complete a course of 7 days. -Plan to d/c home on Prednisone burst (first dose today) x 5 days  HIV:  CD4 420 and undetectable viral RNA on 09/21/2015. On Tivicay and Descovy at home. Reports good compliance with his medications. He is followed by Dr. Ninetta Lights from [place]. -HIV, CD4 340 and viral load 11,200 . Discussed viral load with Dr. Drue Second.  Ok to continue home medications, will need outpatient follow up appointment with Dr. Ninetta Lights to address likely noncompliance.   Hypertension: Patient with hypertensive urgency on arrival. No end organ damage at this time. BP this AM 164/98.  Hydralazine prn.  -Continue home meds - Entresto was increased 10/10 to 49-51 mg -Continue Imdur 60 mg -Continue home hydralazine 100 mg bid  HFrEF: Last echo in 11/2014 with ejection fraction of 30-35%, severely dilated cavity and G2DD.  Last visit to cardiology office appears to be 09/08/2015. BNP 66 on admission. No sign of fluid overload. Some crackles over lower lung fields but chest x-ray without edema. -Continue home meds (coreg, Imdur, lasix and Entresto). Hold losartan. -Echo 10/11 - EF 40-45% (improved from prior 35%) G1DD -c/s to heart failure, appreciate recs  Second degree block on tele.  Patient without chest pain and asymptomatic.  Cards consulted to see if additional workup to do - Per cards, continue to follow but no changes to therapies at this time.  DM-2: Last A1c 9.6 on 09/21/2015. Blood glucose elevated to mid 300s on admission. Blood work not concerning for DKA or HHS. -SSI-intermediate -A1c pending  Hyperlipidemia: Last lipid panel in 09/2015. 26.6% risk for ASCVD -Lipid panel LDL 149 -Start Aspirin 81 mg and Lipitor 40 mg  Major depression: Stable.  Doesn't appear to be on medication. Denies suicidal ideation.  Tobacco use disorder: Reports smoking 2-3 cigarettes a day. Declines nicotine patch.  Alcohol use: Reports drinking up to a pack over the weekends. Recent drink 2 or the 40 ounce beers this weekend. -CIWA  FEN/GI:  -Regular diet  -Saline lock  Prophylaxis: Lovenox   Disposition: Home pending clinical improvement today.   Subjective:  Patient laying in hospital bed and tired-appearing.  States his cough still persists.  Otherwise no complaints and is ready to go home.  Objective: Temp:  [97.8 F (36.6 C)-98.4 F (36.9 C)] 97.8 F (36.6 C) (10/12 0621) Pulse Rate:  [85-95] 87 (10/12 0621) Resp:  [16-18] 16 (10/12 0621) BP: (128-164)/(71-104) 140/90 (10/12 0858) SpO2:  [98 %-100 %] 100 % (10/12 0621)  Weight:  [195 lb 8.8 oz (88.7 kg)] 195 lb 8.8 oz (88.7 kg) (10/12 0423)  GEN: obese, laying in hospital bed in NAD HEENT:  no conjunctival injection, MMM without erythema or exudate CVS: RRR, normal s1 and s2, no murmurs, no edema RESP: normal effort of breathing, still  with good air movement bilaterally, no wheezing noted GI: Obese, soft, NT ND, +bs NEURO: alert and oriented appropriately, no gross deficits  PSYCH: appropriate mood and affect  Laboratory:  Recent Labs Lab 01/17/16 0849 01/19/16 0530  WBC 10.0 4.4  HGB 13.7 13.7  HCT 40.0 38.4*  PLT 131* 134*    Recent Labs Lab 01/17/16 0849 01/18/16 0047 01/19/16 0530 01/20/16 0447  NA 135 133* 132* 133*  K 4.0 3.3* 4.4 4.1  CL 98* 94* 96* 99*  CO2 24 29 26 24   BUN 11 10 11 13   CREATININE 1.10 0.97 0.95 0.84  CALCIUM 9.2 9.0 9.3 9.5  PROT 7.6  --   --   --   BILITOT 0.5  --   --   --   ALKPHOS 82  --   --   --   ALT 25  --   --   --   AST 28  --   --   --   GLUCOSE 354* 200* 248* 222*   Imaging/Diagnostic Tests: Dg Chest 2 View  Result Date: 01/19/2016 CLINICAL DATA:  Productive cough and shortness of breath EXAM: CHEST  2 VIEW COMPARISON:  Two days ago FINDINGS: Improving basilar pneumonia, likely right middle lobe predominant. Background of granulomatous pulmonary and nodal calcifications. Normal heart size and mediastinal contours. IMPRESSION: 1. Improving pneumonia. 2. Granulomatous calcifications. Electronically Signed   By: Marnee SpringJonathon  Watts M.D.   On: 01/19/2016 15:36   Freddrick MarchYashika Mike Berntsen, MD 01/20/2016, 1:17 PM PGY-1,  Family Medicine FPTS Intern pager: [phone], text pages welcome

## 2016-01-20 NOTE — Discharge Summary (Signed)
[facility] Discharge Summary  Patient name: Shaun Brennan Medical record number: [id number] Date of birth: 08/27/1975 Age: 40 y.o. Gender: male Date of Admission: 01/17/2016  Date of Discharge: 01/20/2016 Admitting Physician: Lupita Dawn, MD  Primary Care Provider: Arnoldo Morale, MD Consultants: Heart Failure  Indication for Hospitalization: shortness of breath and cough  Discharge Diagnoses/Problem List:  Patient Active Problem List   Diagnosis Date Noted  . Cough   . SOB (shortness of breath)   . Shortness of breath 01/17/2016  . Accelerated hypertension   . Hyperlipidemia 11/10/2015  . Diabetic neuropathy ([place]) 11/10/2015  . History of ETOH abuse 09/08/2015  . Suicidal ideation 07/14/2014  . Major depression 07/14/2014  . Depression, major, single episode, moderate ([place]) 07/11/2014  . Community acquired pneumonia 07/07/2014  . Community acquired pneumonia of right lower lobe of lung ([place]) 07/07/2014  . HTN (hypertension) 12/17/2013  . Chronic systolic heart failure ([place]) 12/17/2013  . Diabetes mellitus due to underlying condition without complications ([place]) 78/29/5621  . Smoking 12/12/2013  . Acute on chronic systolic heart failure ([place]) 12/09/2013  . Acute respiratory distress 12/06/2013  . 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 DM2 (diabetes mellitus, type 2) ([place]) 12/06/2013  . HIV (human immunodeficiency virus infection) ([place]) 12/06/2013  . H/O CHF 12/06/2013  . Cardiomegaly: per cxr 12/06/13 12/06/2013   Disposition: Home  Discharge Condition: Stable  Discharge Exam: GEN: obese, laying in hospital bed in NAD HEENT: no conjunctival injection, MMM without erythema or exudate CVS: RRR, normal s1 and s2, no murmurs, no edema RESP: normal effort of breathing, still with good air movement bilaterally, no wheezing noted GI: Obese,soft, NT ND, +bs NEURO: alert and oriented appropriately, no gross deficits  PSYCH: appropriate mood and  affect  Brief Hospital Course:  Shaun Andrewsis a 40 y.o.malepresenting with shortness of breath and cough. PMH is significant for HIV, DM 2, CHF, hypertension, tobacco use disorder, major depression andhyperlipidemia  On admission with symptoms suggestive for infectious etiology, likely pneumonia after viral URI.  CXR showed increased opacity over lower lung fields.  Patient immunocompromised with HIV and diabetes.  RVP +for Rhino/Enterovirus.  Urine strep test negative, legionella antigen negative.  Patient had persistent cough throughout hospitalization and was receiving breathing treatments along with Mucinex and supportive care.  He was also started on antibiotics for his pneumonia and clinically improved over the course of his treatment.  At time of discharge, patient was stable and only with slight cough.  Was discharged home on oral antibiotics and prednisone x5 days.    Of note, patient has HIV with CD4 count 420 and undetectable viral load as of 09/21/2015.  Reports good compliance with his medications and follows with Dr. Johnnye Sima ([facility]).  While admitted, CD4 count 340 and viral load 11,200.  Discussed with inpatient [facility] doctor and will need outpatient follow up appointment with Dr. Johnnye Sima to address likely noncompliance with medications.    For his heart failure (HFrEF), patient with last echo on 11/27/2104 with EF 30-35% and G2DD.  No signs of fluid overload on admission and BNP of 66.  Slight crackles over lower lung fields and CXR without edema.  A repeat echo was performed while he was hospitalized.  Showing EF 40-45%, improved from prior.  Home medications were continued.   Issues for Follow Up:  1. Will need follow up appointment with Dr. Johnnye Sima ([facility]) to discuss HIV viral load and medication compliance 2. Will need outpatient spirometry to evaluate for obstructive lung disease  3. Discharged home on 5 days of Levaquin ( to complete a total course of 7 days) 4. Discharged home on  short course of oral Prednisone  5. Entresto dose was increased to 49-'51mg'$ . Will need to follow up on BP.  Significant Procedures: None  Significant Labs and Imaging:   Recent Labs Lab 01/17/16 0849 01/19/16 0530  WBC 10.0 4.4  HGB 13.7 13.7  HCT 40.0 38.4*  PLT 131* 134*    Recent Labs Lab 01/17/16 0849 01/18/16 0047 01/19/16 0530 01/20/16 0447  NA 135 133* 132* 133*  K 4.0 3.3* 4.4 4.1  CL 98* 94* 96* 99*  CO2 '24 29 26 24  '$ GLUCOSE 354* 200* 248* 222*  BUN '11 10 11 13  '$ CREATININE 1.10 0.97 0.95 0.84  CALCIUM 9.2 9.0 9.3 9.5  ALKPHOS 82  --   --   --   AST 28  --   --   --   ALT 25  --   --   --   ALBUMIN 3.6  --   --   --    Results/Tests Pending at Time of Discharge: None  Discharge Medications:    Medication List    STOP taking these medications   sacubitril-valsartan 24-26 MG Commonly known as:  ENTRESTO Replaced by:  sacubitril-valsartan 49-51 MG     TAKE these medications   aspirin 81 MG EC tablet Take 1 tablet (81 mg total) by mouth daily. Start taking on:  01/21/2016   atorvastatin 20 MG tablet Commonly known as:  LIPITOR Take 1 tablet (20 mg total) by mouth daily.   benzonatate 200 MG capsule Commonly known as:  TESSALON Take 1 capsule (200 mg total) by mouth 3 (three) times daily as needed for cough.   carvedilol 25 MG tablet Commonly known as:  COREG TAKE 1 TABLET BY MOUTH 2 TIMES DAILY WITH A MEAL.   dolutegravir 50 MG tablet Commonly known as:  TIVICAY Take 1 tablet (50 mg total) by mouth daily.   emtricitabine-tenofovir AF 200-25 MG tablet Commonly known as:  DESCOVY Take 1 tablet by mouth daily.   furosemide 40 MG tablet Commonly known as:  LASIX TAKE 1 TABLET BY MOUTH ON MONDAYS & FRIDAYS   glucose blood test strip Commonly known as:  TRUE METRIX BLOOD GLUCOSE TEST Use 3 times daily before meals   glucose monitoring kit monitoring kit 1 each by Does not apply route 4 (four) times daily - after meals and at bedtime. 1  month Diabetic Testing Supplies for QAC-QHS accuchecks.   TRUE METRIX METER Devi 1 each by Does not apply route 3 (three) times daily before meals.   guaiFENesin 600 MG 12 hr tablet Commonly known as:  MUCINEX Take 1 tablet (600 mg total) by mouth 2 (two) times daily.   hydrALAZINE 50 MG tablet Commonly known as:  APRESOLINE Take 2 tablets (100 mg total) by mouth 2 (two) times daily.   HYDROcodone-acetaminophen 5-325 MG tablet Commonly known as:  NORCO/VICODIN Take 1 tablet by mouth every 4 (four) hours as needed for pain.   isosorbide mononitrate 60 MG 24 hr tablet Commonly known as:  IMDUR Take 1 tablet (60 mg total) by mouth daily.   levofloxacin 500 MG tablet Commonly known as:  LEVAQUIN Take 1 tablet (500 mg total) by mouth daily.   metFORMIN 500 MG tablet Commonly known as:  GLUCOPHAGE Take 2 tablets (1,000 mg total) by mouth 2 (two) times daily with a meal.   predniSONE 50 MG  tablet Commonly known as:  DELTASONE Take 1 tablet (50 mg total) by mouth daily with breakfast. Start taking on:  01/21/2016   sacubitril-valsartan 49-51 MG Commonly known as:  ENTRESTO Take 1 tablet by mouth 2 (two) times daily. Replaces:  sacubitril-valsartan 24-26 MG   TRUEPLUS LANCETS 28G Misc 1 each by Does not apply route 3 (three) times daily before meals.      Discharge Instructions: Please refer to Patient Instructions section of EMR for full details.  Patient was counseled important signs and symptoms that should prompt return to medical care, changes in medications, dietary instructions, activity restrictions, and follow up appointments.   Follow-Up Appointments: Follow-up Information    [facility] Follow up on 01/28/2016.   Specialty:  Cardiology Why:  at 0930 for post hospital follow up. Please bring all of your medications to your visit. The code for parking is 4000. Contact information: [address] [id number] mc [place]         Lovenia Kim, MD 01/20/2016, 4:32 PM PGY-1, [place]

## 2016-01-20 NOTE — Progress Notes (Signed)
Inpatient Diabetes Program Recommendations  AACE/ADA: New Consensus Statement on Inpatient Glycemic Control (2015)  Target Ranges:  Prepandial:   less than 140 mg/dL      Peak postprandial:   less than 180 mg/dL (1-2 hours)      Critically ill patients:  140 - 180 mg/dL   Lab Results  Component Value Date   GLUCAP 191 (H) 01/20/2016   HGBA1C 10.3 (H) 01/17/2016    Review of Glycemic Control  Diabetes history: DM 2 Outpatient Diabetes medications: Metformin 1000 BID Current orders for Inpatient glycemic control: Novolog Moderate + HS scale  Inpatient Diabetes Program Recommendations:  Glucose consistently in the 200 range. A1c 10.3% on 01/17/16. Patient will need medication adjustment at time of discharge. Could increase Novolog Correction scale while inpatient.  Thanks,  Christena Deem RN, MSN, [facility] Inpatient Diabetes Coordinator Team Pager [phone] (8a-5p)

## 2016-01-28 ENCOUNTER — Encounter (HOSPITAL_COMMUNITY): Payer: Self-pay

## 2016-02-04 ENCOUNTER — Telehealth: Payer: Self-pay | Admitting: *Deleted

## 2016-02-04 MED FILL — ENTRESTO 49 MG-51 MG TABLET: 49-51 | 30 days supply | Qty: 60 | Fill #0

## 2016-02-04 MED FILL — levoFLOXacin 500 MG TABS: 500 | 5 days supply | Qty: 5 | Fill #0

## 2016-02-04 MED FILL — predniSONE 10 MG TABS: 10 | 4 days supply | Qty: 20 | Fill #0

## 2016-02-04 MED FILL — BENZONATATE 100 MG CAPSULE: 100 | 6 days supply | Qty: 40 | Fill #0

## 2016-02-04 NOTE — Telephone Encounter (Signed)
Pt did not use MATCH card before it expired and was at pharmacy to pick up meds.  [facility] reinstated card for use at [facility].

## 2016-02-16 ENCOUNTER — Encounter (HOSPITAL_COMMUNITY): Payer: Self-pay

## 2016-02-17 ENCOUNTER — Inpatient Hospital Stay (HOSPITAL_COMMUNITY): Admission: RE | Admit: 2016-02-17 | Payer: Self-pay | Source: Ambulatory Visit

## 2016-02-21 ENCOUNTER — Emergency Department (HOSPITAL_COMMUNITY)
Admission: EM | Admit: 2016-02-21 | Discharge: 2016-02-21 | Disposition: A | Discharge status: Home / Self Care | Payer: Self-pay | Attending: Emergency Medicine | Admitting: Emergency Medicine

## 2016-02-21 ENCOUNTER — Encounter (HOSPITAL_COMMUNITY): Payer: Self-pay | Admitting: Nurse Practitioner

## 2016-02-21 DIAGNOSIS — I110 Hypertensive heart disease with heart failure: Secondary | ICD-10-CM | POA: Insufficient documentation

## 2016-02-21 DIAGNOSIS — Y929 Unspecified place or not applicable: Secondary | ICD-10-CM | POA: Insufficient documentation

## 2016-02-21 DIAGNOSIS — S31821A Laceration without foreign body of left buttock, initial encounter: Secondary | ICD-10-CM | POA: Insufficient documentation

## 2016-02-21 DIAGNOSIS — Z7984 Long term (current) use of oral hypoglycemic drugs: Secondary | ICD-10-CM | POA: Insufficient documentation

## 2016-02-21 DIAGNOSIS — F17210 Nicotine dependence, cigarettes, uncomplicated: Secondary | ICD-10-CM | POA: Insufficient documentation

## 2016-02-21 DIAGNOSIS — Z79899 Other long term (current) drug therapy: Secondary | ICD-10-CM | POA: Insufficient documentation

## 2016-02-21 DIAGNOSIS — I5023 Acute on chronic systolic (congestive) heart failure: Secondary | ICD-10-CM | POA: Insufficient documentation

## 2016-02-21 DIAGNOSIS — S31829A Unspecified open wound of left buttock, initial encounter: Secondary | ICD-10-CM

## 2016-02-21 DIAGNOSIS — Y999 Unspecified external cause status: Secondary | ICD-10-CM | POA: Insufficient documentation

## 2016-02-21 DIAGNOSIS — E1140 Type 2 diabetes mellitus with diabetic neuropathy, unspecified: Secondary | ICD-10-CM | POA: Insufficient documentation

## 2016-02-21 DIAGNOSIS — Z7982 Long term (current) use of aspirin: Secondary | ICD-10-CM | POA: Insufficient documentation

## 2016-02-21 DIAGNOSIS — Y939 Activity, unspecified: Secondary | ICD-10-CM | POA: Insufficient documentation

## 2016-02-21 DIAGNOSIS — W268XXA Contact with other sharp object(s), not elsewhere classified, initial encounter: Secondary | ICD-10-CM | POA: Insufficient documentation

## 2016-02-21 DIAGNOSIS — Z23 Encounter for immunization: Secondary | ICD-10-CM | POA: Insufficient documentation

## 2016-02-21 MED ORDER — TETANUS-DIPHTH-ACELL PERTUSSIS 5-2.5-18.5 LF-MCG/0.5 IM SUSP
0.5000 mL | Freq: Once | INTRAMUSCULAR | Status: AC
  Administered 2016-02-21: 0.5 mL via INTRAMUSCULAR
  Filled 2016-02-21: qty 0.5

## 2016-02-21 MED ORDER — IBUPROFEN 200 MG PO TABS
600.0000 mg | ORAL_TABLET | Freq: Once | ORAL | Status: AC
  Administered 2016-02-21: 600 mg via ORAL
  Filled 2016-02-21: qty 1

## 2016-02-21 MED ORDER — CLINDAMYCIN HCL 150 MG PO CAPS: 300.0000 mg | ORAL_CAPSULE | Freq: Three times a day (TID) | ORAL | 0 refills | Status: DC

## 2016-02-21 NOTE — Discharge Instructions (Signed)
Keep wound clean using antibacterial soap and water, pat dry. I also recommend changing dressings daily. Keep you steri-strips on until they fall off on their own. He may continue taking Tylenol and ibuprofen as prescribed over-the-counter as needed for pain relief. Follow-up with your primary care provider in 3 days for wound recheck. Please return to the Emergency Department if symptoms worsen or new onset of fever, swelling, redness, drainage.

## 2016-02-21 NOTE — ED Triage Notes (Signed)
Pt presents with c/o buttocks injury. He fell in the shower on Saturday and landed on a metal bar, avulsing skin to inner gluteal area. He complains of severe pain at the site.

## 2016-02-21 NOTE — ED Notes (Signed)
Papers and medications reviewed with patient and significant other. They verbalize understanding

## 2016-02-21 NOTE — ED Provider Notes (Signed)
[facility] DEPT Provider Note   CSN: [id number] Arrival date & time: 02/21/16  1158     History   Chief Complaint Chief Complaint  Patient presents with  . Rectal Pain    HPI Shaun Brennan is a 40 y.o. male.  Pt is a 40 yo male with PMH of HIV, DM, HTN and CHF who presents to the ED with complaint of left buttocks pain. Pt reports 2 days ago he slipped and fell while he was taking a shower in the morning. He reports scraping his left buttocks on the metal water faucet while he was falling and notes he landed on his buttocks. Denies head injury or LOC. Pt reports having small amount of bleeding initially which has since resolved. He reports having mild associated pain to the wound which he notes is worse  When sitting or bearing weight on his buttocks. Reports only having a small amount of clear drainage from the wound. Pt denies fever, CP, abdominal pain, vomiting, diarrhea, rectal bleeding, blood in urine or stool, numbness, tingling, saddle anesthesia, loss of bowel or bladder, weakness. Pt denies taking any medications for his sxs or applying any creams/ointments to his wound since the injury. Tetanus status unknown.      Past Medical History:  Diagnosis Date  . Chronic systolic heart failure (HCC)    a. EF 15-20%, grade II DD, LA mild/mod dilated  . Diabetes mellitus without complication ([place])   . DM2 (diabetes mellitus, type 2) ([place])   . HIV (human immunodeficiency virus infection) ([place])   . Hypertension     Patient Active Problem List   Diagnosis Date Noted  . Cough   . SOB (shortness of breath)   . Shortness of breath 01/17/2016  . Accelerated hypertension   . Hyperlipidemia 11/10/2015  . Diabetic neuropathy ([place]) 11/10/2015  . History of ETOH abuse 09/08/2015  . Suicidal ideation 07/14/2014  . Major depression 07/14/2014  . Depression, major, single episode, moderate ([place]) 07/11/2014  . Community acquired pneumonia 07/07/2014  . Community acquired  pneumonia of right lower lobe of lung ([place]) 07/07/2014  . HTN (hypertension) 12/17/2013  . Chronic systolic heart failure ([place]) 12/17/2013  . Diabetes mellitus due to underlying condition without complications ([place]) 28/76/8115  . Smoking 12/12/2013  . Acute on chronic systolic heart failure ([place]) 12/09/2013  . Acute respiratory distress 12/06/2013  . 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 DM2 (diabetes mellitus, type 2) ([place]) 12/06/2013  . HIV (human immunodeficiency virus infection) ([place]) 12/06/2013  . H/O CHF 12/06/2013  . Cardiomegaly: per cxr 12/06/13 12/06/2013    Past Surgical History:  Procedure Laterality Date  . ANKLE SURGERY Right        Home Medications    Prior to Admission medications   Medication Sig Start Date End Date Taking? Authorizing Provider  aspirin EC 81 MG EC tablet Take 1 tablet (81 mg total) by mouth daily. 01/21/16   Lovenia Kim, MD  atorvastatin (LIPITOR) 20 MG tablet Take 1 tablet (20 mg total) by mouth daily. 11/10/15   Arnoldo Morale, MD  benzonatate (TESSALON) 200 MG capsule Take 1 capsule (200 mg total) by mouth 3 (three) times daily as needed for cough. 01/20/16   Lovenia Kim, MD  Blood Glucose Monitoring Suppl (TRUE METRIX METER) DEVI 1 each by Does not apply route 3 (three) times daily before meals. 11/10/15   Arnoldo Morale, MD  carvedilol (COREG) 25 MG tablet TAKE 1 TABLET BY MOUTH 2 TIMES DAILY WITH A MEAL. 10/06/15  Jolaine Artist, MD  clindamycin (CLEOCIN) 150 MG capsule Take 2 capsules (300 mg total) by mouth 3 (three) times daily. May dispense as '150mg'$  capsules 02/21/16   Chesley Noon Nadeau, PA-C  dolutegravir (TIVICAY) 50 MG tablet Take 1 tablet (50 mg total) by mouth daily. 10/05/15   Campbell Riches, MD  emtricitabine-tenofovir AF (DESCOVY) 200-25 MG tablet Take 1 tablet by mouth daily. 10/05/15   Campbell Riches, MD  furosemide (LASIX) 40 MG tablet TAKE 1 TABLET BY MOUTH ON MONDAYS & FRIDAYS 08/10/15   [name] D Clegg, NP  glucose blood  (TRUE METRIX BLOOD GLUCOSE TEST) test strip Use 3 times daily before meals 11/10/15   Arnoldo Morale, MD  glucose monitoring kit (FREESTYLE) monitoring kit 1 each by Does not apply route 4 (four) times daily - after meals and at bedtime. 1 month Diabetic Testing Supplies for QAC-QHS accuchecks. 12/12/13   Lorayne Marek, MD  guaiFENesin (MUCINEX) 600 MG 12 hr tablet Take 1 tablet (600 mg total) by mouth 2 (two) times daily. 01/20/16   Lovenia Kim, MD  hydrALAZINE (APRESOLINE) 50 MG tablet Take 2 tablets (100 mg total) by mouth 2 (two) times daily. 08/11/15   [name] D Ninfa Meeker, NP  HYDROcodone-acetaminophen (NORCO/VICODIN) 5-325 MG tablet Take 1 tablet by mouth every 4 (four) hours as needed for pain. 11/04/15   Historical Provider, MD  isosorbide mononitrate (IMDUR) 60 MG 24 hr tablet Take 1 tablet (60 mg total) by mouth daily. 03/08/15   Jolaine Artist, MD  levofloxacin (LEVAQUIN) 500 MG tablet Take 1 tablet (500 mg total) by mouth daily. 01/20/16   Lovenia Kim, MD  metFORMIN (GLUCOPHAGE) 500 MG tablet Take 2 tablets (1,000 mg total) by mouth 2 (two) times daily with a meal. 11/10/15   Arnoldo Morale, MD  predniSONE (DELTASONE) 50 MG tablet Take 1 tablet (50 mg total) by mouth daily with breakfast. 01/21/16   Lovenia Kim, MD  sacubitril-valsartan (ENTRESTO) 49-51 MG Take 1 tablet by mouth 2 (two) times daily. 01/20/16   Lovenia Kim, MD  TRUEPLUS LANCETS 28G MISC 1 each by Does not apply route 3 (three) times daily before meals. 11/10/15   Arnoldo Morale, MD    Family History Family History  Problem Relation Age of Onset  . Other Father   . Diabetes Mother   . Stroke Paternal Grandfather     Social History Social History  Substance Use Topics  . Smoking status: Current Every Day Smoker    Packs/day: 0.25    Types: Cigarettes  . Smokeless tobacco: Never Used     Comment: about 10 cigarettes a week  . Alcohol use 14.4 oz/week    12 Cans of beer, 12 Shots of liquor per week     Allergies    Lisinopril   Review of Systems Review of Systems  Skin: Positive for wound.  All other systems reviewed and are negative.    Physical Exam Updated Vital Signs BP 146/94 (BP Location: Right Arm)   Pulse 96   Temp 97.7 F (36.5 C) (Oral)   Resp 20   Ht '5\' 5"'$  (1.651 m)   Wt 93 kg   SpO2 99%   BMI 34.11 kg/m   Physical Exam  Constitutional: He is oriented to person, place, and time. He appears well-developed and well-nourished. No distress.  HENT:  Head: Normocephalic and atraumatic.  Eyes: Conjunctivae and EOM are normal. Right eye exhibits no discharge. Left eye exhibits no discharge. No scleral icterus.  Neck: Normal  range of motion. Neck supple.  Cardiovascular: Normal rate, regular rhythm, normal heart sounds and intact distal pulses.   Pulmonary/Chest: Effort normal and breath sounds normal. No respiratory distress. He has no wheezes. He has no rales. He exhibits no tenderness.  Abdominal: Soft. Bowel sounds are normal. He exhibits no distension and no mass. There is no tenderness. There is no rebound and no guarding. No hernia.  Musculoskeletal: Normal range of motion. He exhibits no edema, tenderness or deformity.  No midline C, T, or L tenderness. Full range of motion of neck and back. Full range of motion of bilateral upper and lower extremities, with 5/5 strength. Sensation intact. 2+ radial and PT pulses. Cap refill <2 seconds. Patient able to stand and ambulate without assistance.    Neurological: He is alert and oriented to person, place, and time. He has normal strength. No sensory deficit.  Skin: Skin is warm and dry. Capillary refill takes less than 2 seconds. He is not diaphoretic.  2cm thin triangular flap laceration noted to left inner buttocks lateral to rectum. No active bleeding or drainage noted. Subcutaneous fat visible, no visible vessels or muscle fibers.   Nursing note and vitals reviewed.    ED Treatments / Results  Labs (all labs ordered are  listed, but only abnormal results are displayed) Labs Reviewed - No data to display  EKG  EKG Interpretation None       Radiology No results found.  Procedures .Marland KitchenLaceration Repair Date/Time: 02/21/2016 5:41 PM Performed by: Nona Dell Authorized by: Nona Dell   Consent:    Consent obtained:  Verbal   Consent given by:  Patient   Risks discussed:  Infection, pain, poor cosmetic result and poor wound healing Laceration details:    Location: left medial buttocks.   Wound length (cm): 2cm long triangular flap. Repair type:    Repair type:  Simple Exploration:    Wound extent: no foreign bodies/material noted, no muscle damage noted, no nerve damage noted, no tendon damage noted and no vascular damage noted   Treatment:    Area cleansed with:  Saline   Amount of cleaning:  Extensive   Irrigation solution:  Sterile saline   Irrigation method:  Syringe Skin repair:    Repair method:  Steri-Strips   Number of Steri-Strips:  3 Approximation:    Approximation:  Close Post-procedure details:    Dressing:  Adhesive bandage   Patient tolerance of procedure:  Tolerated well, no immediate complications   (including critical care time)  Medications Ordered in ED Medications  Tdap (BOOSTRIX) injection 0.5 mL (0.5 mLs Intramuscular Given 02/21/16 1618)  ibuprofen (ADVIL,MOTRIN) tablet 600 mg (600 mg Oral Given 02/21/16 1618)     Initial Impression / Assessment and Plan / ED Course  I have reviewed the triage vital signs and the nursing notes.  Pertinent labs & imaging results that were available during my care of the patient were reviewed by me and considered in my medical decision making (see chart for details).  Clinical Course     Pressure irrigation performed. Wound explored and base of wound visualized in a bloodless field without evidence of foreign body.  Laceration occurred > 8 hours prior to arrival. Due to patient with history of HIV,  diabetes and delayed onset of laceration, will plan to apply Steri-Strips to closely approximate flap laceration to help with wound healing. Patient will be started on clindamycin due to pt has with comorbidities to effect normal wound healing. Tdap  updated.  Discussed wound home care with patient and answered questions. Pt to follow-up for wound check with PCP in 2-3 days; they are to return to the ED sooner for signs of infection. Pt is hemodynamically stable with no complaints prior to dc.    Final Clinical Impressions(s) / ED Diagnoses   Final diagnoses:  Wound of left buttock, initial encounter    New Prescriptions New Prescriptions   CLINDAMYCIN (CLEOCIN) 150 MG CAPSULE    Take 2 capsules (300 mg total) by mouth 3 (three) times daily. May dispense as '150mg'$  capsules     Chesley Noon [place], [place] 02/21/16 1744    Carmin Muskrat, MD 02/22/16 0020

## 2016-02-21 NOTE — ED Notes (Signed)
Assisted PA with dressing application

## 2016-02-22 ENCOUNTER — Encounter (HOSPITAL_COMMUNITY): Payer: Self-pay

## 2016-02-29 ENCOUNTER — Inpatient Hospital Stay (HOSPITAL_COMMUNITY): Admission: RE | Admit: 2016-02-29 | Payer: Self-pay | Source: Ambulatory Visit

## 2016-03-27 ENCOUNTER — Ambulatory Visit: Payer: Self-pay | Admitting: Infectious Diseases

## 2016-04-21 IMAGING — CR DG CHEST 2V
1 series · 1 of 1 positions shown · non-contrast
Comparison: None.

CLINICAL DATA: Cough and chest pain.

EXAM:
CHEST  2 VIEW

[x chest ap]
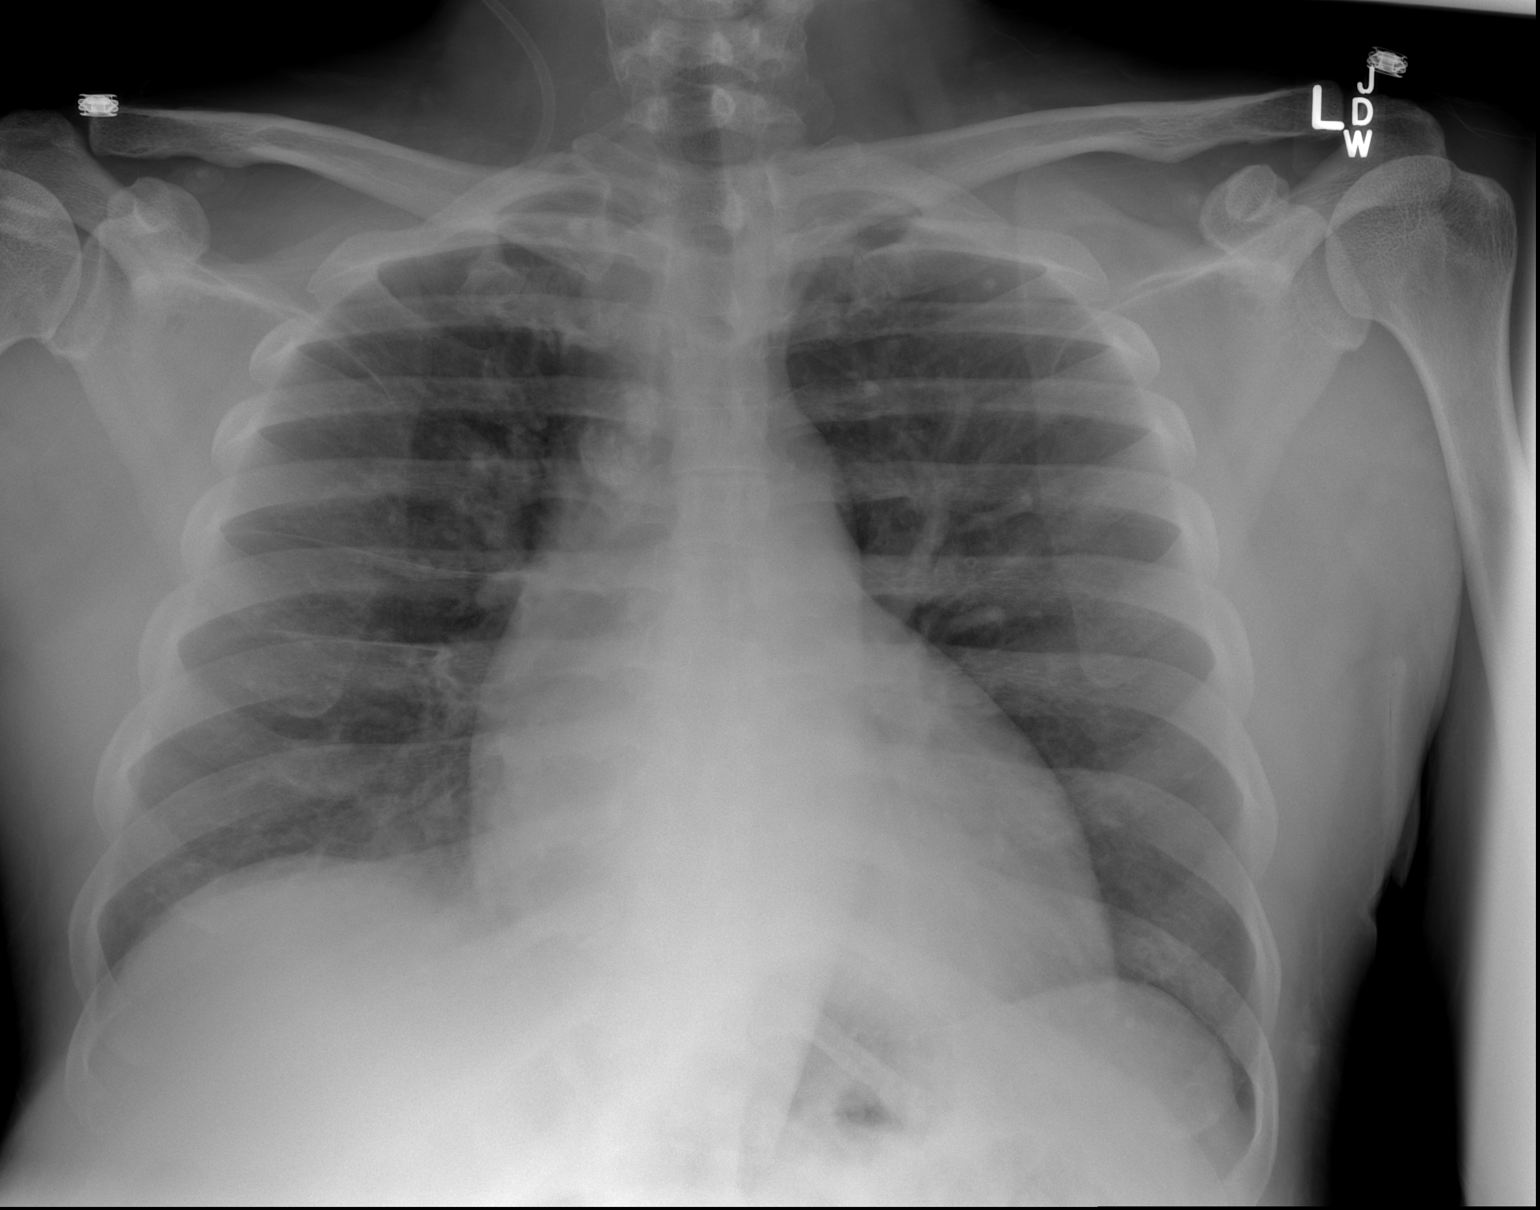

[1 of 1 positions shown; findings below may reference images not displayed]

FINDINGS: Mediastinum and hilar structures are normal. Cardiomegaly. Pulmonary
vascularity is normal. Mild right base infiltrate cannot be
excluded. No pleural effusion or pneumothorax.
IMPRESSION: 1. Mild right base infiltrate cannot be excluded.
2. Cardiomegaly, no evidence of congestive heart failure.

## 2016-04-25 ENCOUNTER — Ambulatory Visit (HOSPITAL_COMMUNITY)
Admission: RE | Admit: 2016-04-25 | Discharge: 2016-04-25 | Disposition: A | Discharge status: Home / Self Care | Payer: Self-pay | Source: Ambulatory Visit | Attending: Cardiology | Admitting: Cardiology

## 2016-04-25 VITALS — BP 180/110 | HR 99 | Wt 200.4 lb

## 2016-04-25 DIAGNOSIS — F1011 Alcohol abuse, in remission: Secondary | ICD-10-CM

## 2016-04-25 DIAGNOSIS — F1290 Cannabis use, unspecified, uncomplicated: Secondary | ICD-10-CM | POA: Insufficient documentation

## 2016-04-25 DIAGNOSIS — Z833 Family history of diabetes mellitus: Secondary | ICD-10-CM | POA: Insufficient documentation

## 2016-04-25 DIAGNOSIS — Z8489 Family history of other specified conditions: Secondary | ICD-10-CM | POA: Insufficient documentation

## 2016-04-25 DIAGNOSIS — I429 Cardiomyopathy, unspecified: Secondary | ICD-10-CM | POA: Insufficient documentation

## 2016-04-25 DIAGNOSIS — F1010 Alcohol abuse, uncomplicated: Secondary | ICD-10-CM | POA: Insufficient documentation

## 2016-04-25 DIAGNOSIS — Z9119 Patient's noncompliance with other medical treatment and regimen: Secondary | ICD-10-CM | POA: Insufficient documentation

## 2016-04-25 DIAGNOSIS — Z87898 Personal history of other specified conditions: Secondary | ICD-10-CM

## 2016-04-25 DIAGNOSIS — F17210 Nicotine dependence, cigarettes, uncomplicated: Secondary | ICD-10-CM | POA: Insufficient documentation

## 2016-04-25 DIAGNOSIS — B20 Human immunodeficiency virus [HIV] disease: Secondary | ICD-10-CM

## 2016-04-25 DIAGNOSIS — I10 Essential (primary) hypertension: Secondary | ICD-10-CM

## 2016-04-25 DIAGNOSIS — Z823 Family history of stroke: Secondary | ICD-10-CM | POA: Insufficient documentation

## 2016-04-25 DIAGNOSIS — Z9114 Patient's other noncompliance with medication regimen: Secondary | ICD-10-CM | POA: Insufficient documentation

## 2016-04-25 DIAGNOSIS — F17200 Nicotine dependence, unspecified, uncomplicated: Secondary | ICD-10-CM

## 2016-04-25 DIAGNOSIS — E119 Type 2 diabetes mellitus without complications: Secondary | ICD-10-CM | POA: Insufficient documentation

## 2016-04-25 DIAGNOSIS — Z590 Homelessness: Secondary | ICD-10-CM | POA: Insufficient documentation

## 2016-04-25 DIAGNOSIS — I5022 Chronic systolic (congestive) heart failure: Secondary | ICD-10-CM

## 2016-04-25 DIAGNOSIS — Z7984 Long term (current) use of oral hypoglycemic drugs: Secondary | ICD-10-CM | POA: Insufficient documentation

## 2016-04-25 DIAGNOSIS — I110 Hypertensive heart disease with heart failure: Secondary | ICD-10-CM | POA: Insufficient documentation

## 2016-04-25 DIAGNOSIS — Z21 Asymptomatic human immunodeficiency virus [HIV] infection status: Secondary | ICD-10-CM | POA: Insufficient documentation

## 2016-04-25 MED ORDER — SACUBITRIL-VALSARTAN 49-51 MG PO TABS: 1.0000 | ORAL_TABLET | Freq: Two times a day (BID) | ORAL | 3 refills | Status: DC

## 2016-04-25 MED ORDER — HYDRALAZINE HCL 50 MG PO TABS: 100.0000 mg | ORAL_TABLET | Freq: Two times a day (BID) | ORAL | 11 refills | Status: DC

## 2016-04-25 MED ORDER — ISOSORBIDE MONONITRATE ER 60 MG PO TB24: 60.0000 mg | ORAL_TABLET | Freq: Every day | ORAL | 6 refills | Status: DC

## 2016-04-25 MED ORDER — FUROSEMIDE 40 MG PO TABS: 40.0000 mg | ORAL_TABLET | ORAL | 11 refills | Status: DC

## 2016-04-25 MED FILL — hydrALAZINE HCL 50 MG TABS: 50 | 30 days supply | Qty: 120 | Fill #0

## 2016-04-25 MED FILL — ISOSORBIDE MN ER 60 MG TAB: 60 | 30 days supply | Qty: 30 | Fill #0

## 2016-04-25 MED FILL — FUROSEMIDE 40 MG TABLET: 40 | 30 days supply | Qty: 10 | Fill #0

## 2016-04-25 NOTE — Patient Instructions (Signed)
No changes to medication.  No lab work today.  Follow up 3 months with [name] Clegg NP-C.  Do the following things EVERYDAY: 1) Weigh yourself in the morning before breakfast. Write it down and keep it in a log. 2) Take your medicines as prescribed 3) Eat low salt foods-Limit salt (sodium) to 2000 mg per day.  4) Stay as active as you can everyday 5) Limit all fluids for the day to less than 2 liters

## 2016-04-25 NOTE — Progress Notes (Signed)
Patient ID: Shaun Brennan, male   DOB: 1976-01-29, 41 y.o.   MRN: [id number]   PCP: Dr. Annitta Needs [facility]) Primary Cardiologist: Dr. Haroldine Laws ID; Dr Johnnye Sima.   HPI: Shaun Brennan is a 41 yo male with a history of HIV, DM2, HTN, tobacco abuse and chronic systolic heart failure (due to presumed NICM EF 25-30% - no cath).   Follow up for Heart Failure:  Since the last visit he was admitted in October with pneumonia. Overall feeling great. Denies SOB except for steps. Does not have a scale. Does not need extra lasix. Appetite ok. Denies fever or chills. Says he misses his medications 2-3 days a week. Has all medications. Drinking 12 beers on the weekend. Smoking marijuana every now and then.   ECHO (12/08/2013): EF 15-20%, grade II DD, mild/mod LA, RV normal ECHO 04/27/2014 EF 25-30% RV mildy HK. Not seen well.  ECHO 11/30/14 preliminary results 40-45% ECHO 01/19/2016: EF 40-45%. Grade IDD  ROS: All systems negative except as listed in HPI, PMH and Problem List.  SH:  Social History   Social History  . Marital status: Single    Spouse name: N/A  . Number of children: N/A  . Years of education: N/A   Occupational History  . Not on file.   Social History Main Topics  . Smoking status: Current Every Day Smoker    Packs/day: 0.25    Types: Cigarettes  . Smokeless tobacco: Never Used     Comment: about 10 cigarettes a week  . Alcohol use 14.4 oz/week    12 Cans of beer, 12 Shots of liquor per week  . Drug use:     Types: Marijuana  . Sexual activity: Not on file     Comment: pt. given condoms   Other Topics Concern  . Not on file   Social History Narrative   Currently homeless    FH:  Family History  Problem Relation Age of Onset  . Other Father   . Diabetes Mother   . Stroke Paternal Grandfather     Past Medical History:  Diagnosis Date  . Chronic systolic heart failure (HCC)    a. EF 15-20%, grade II DD, LA mild/mod dilated  . Diabetes mellitus  without complication ([place])   . DM2 (diabetes mellitus, type 2) ([place])   . HIV (human immunodeficiency virus infection) ([place])   . Hypertension     Current Outpatient Prescriptions  Medication Sig Dispense Refill  . atorvastatin (LIPITOR) 20 MG tablet Take 1 tablet (20 mg total) by mouth daily. 30 tablet 3  . Blood Glucose Monitoring Suppl (TRUE METRIX METER) DEVI 1 each by Does not apply route 3 (three) times daily before meals. 1 Device 0  . carvedilol (COREG) 25 MG tablet TAKE 1 TABLET BY MOUTH 2 TIMES DAILY WITH A MEAL. 60 tablet 3  . dolutegravir (TIVICAY) 50 MG tablet Take 1 tablet (50 mg total) by mouth daily. 30 tablet 6  . emtricitabine-tenofovir AF (DESCOVY) 200-25 MG tablet Take 1 tablet by mouth daily. 90 tablet 3  . furosemide (LASIX) 40 MG tablet TAKE 1 TABLET BY MOUTH ON MONDAYS & FRIDAYS 10 tablet PRN  . glucose blood (TRUE METRIX BLOOD GLUCOSE TEST) test strip Use 3 times daily before meals 100 each 12  . glucose monitoring kit (FREESTYLE) monitoring kit 1 each by Does not apply route 4 (four) times daily - after meals and at bedtime. 1 month Diabetic Testing Supplies for QAC-QHS accuchecks. 1 each 1  .  guaiFENesin (MUCINEX) 600 MG 12 hr tablet Take 1 tablet (600 mg total) by mouth 2 (two) times daily.    . hydrALAZINE (APRESOLINE) 50 MG tablet Take 2 tablets (100 mg total) by mouth 2 (two) times daily. 120 tablet 3  . HYDROcodone-acetaminophen (NORCO/VICODIN) 5-325 MG tablet Take 1 tablet by mouth every 4 (four) hours as needed for pain.  0  . isosorbide mononitrate (IMDUR) 60 MG 24 hr tablet Take 1 tablet (60 mg total) by mouth daily. 30 tablet 6  . metFORMIN (GLUCOPHAGE) 500 MG tablet Take 2 tablets (1,000 mg total) by mouth 2 (two) times daily with a meal. 120 tablet 3  . sacubitril-valsartan (ENTRESTO) 49-51 MG Take 1 tablet by mouth 2 (two) times daily. 60 tablet 0  . TRUEPLUS LANCETS 28G MISC 1 each by Does not apply route 3 (three) times daily before meals. 100 each 12  .  aspirin EC 81 MG EC tablet Take 1 tablet (81 mg total) by mouth daily. (Patient not taking: Reported on 04/25/2016)     No current facility-administered medications for this encounter.     Vitals:   04/25/16 1403  BP: (!) 180/110  BP Location: Left Arm  Patient Position: Sitting  Cuff Size: Normal  Pulse: 99  SpO2: 99%  Weight: 200 lb 6.4 oz (90.9 kg)    PHYSICAL EXAM: General:  Well appearing. NAD. Walked in the clinic. Marland Kitchen  HEENT: normal Neck: supple. JVP flat. Carotids 2+ bilaterally; no bruits. No lymphadenopathy or thryomegaly. Cor: PMI normal. Regular rate & rhythm. No rubs, gallops or murmurs. Lungs: CTAB. Abdomen: obese, nontender. nondistended. No HSM. No bruits or masses. +BS Extremities: no cyanosis, clubbing, rash, no lower extremity edema.  Neuro: alert & orientedx3, cranial nerves grossly intact. Moves all 4 extremities w/o difficulty. Affect pleasant.   ASSESSMENT & PLAN: 1) Chronic systolic HF: NICM?, ECHO 28/2081 EF 40-45%  - NYHA II symptoms. Volume status stable. Continue lasix twice a day.  Continue goal dose of carvedilol.   - Continue  hydralazine to 100 mg twice a day. Reordered imduc trContinue  Imdur 30 mg daily.  -Continue entresto 24-26 mg twice a day. - He is not on spiro due to noncompliance.   --2) HTN - Elevated but did not have hydralazine today and he has been out of lasix + imdur. Reordered today.  3) HIV- Per ID.  4) Tobacco abuse:- Counseled on smoking cessation.  He declines smoking cessation.  6) Alcohol abuse: discussed alcohol cessation.    Encouraged to take medications as directed.  Follow up in 3 months   [name] Clegg, NP-C  2:00 PM

## 2016-04-25 NOTE — Progress Notes (Signed)
CSW met with patient in the clinic. Patient well known to this CSW from previous visits. Patient's friend who escorted him to the clinic unfortunately had a run in with security which caused patient great distress. CSW provided supportive intervention and assured patient had transportation and support at home. Patient shared excitement that he continues to reside in his apartment and will celebrate a year next month. CSW continues to be available as needed through the HF clinic. Raquel Sarna, [place], [place]

## 2016-04-25 NOTE — Addendum Note (Signed)
Encounter addended by: Marcy Siren, LCSW on: 04/25/2016  3:41 PM<BR>    Actions taken: Sign clinical note

## 2016-04-25 NOTE — Progress Notes (Signed)
Advanced Heart Failure Medication Review by a Pharmacist  Does the patient  feel that his/her medications are working for him/her?  yes  Has the patient been experiencing any side effects to the medications prescribed?  no  Does the patient measure his/her own blood pressure or blood glucose at home?  no   Does the patient have any problems obtaining medications due to transportation or finances?   yes  Understanding of regimen: fair Understanding of indications: fair Potential of compliance: fair Patient understands to avoid NSAIDs. Patient understands to avoid decongestants.  Issues to address at subsequent visits: Compliance   Pharmacist comments:  Mr. Shaun Brennan is a pleasant 41 yo M presenting with his medication bottles. He reports good compliance with his regimen but did admit to being out of furosemide and isosorbide x 1-2 weeks 2/2 running out and not picking up his refills. He did not have any other medication-related questions or concerns for me at this time.   Tyler Deis. Bonnye Fava, PharmD, BCPS, CPP Clinical Pharmacist Pager: [phone] Phone: [phone] 04/25/2016 2:18 PM      Time with patient: 10 minutes Preparation and documentation time: 2 minutes Total time: 12 minutes

## 2016-04-27 ENCOUNTER — Other Ambulatory Visit: Payer: Self-pay

## 2016-04-28 ENCOUNTER — Telehealth (HOSPITAL_COMMUNITY): Payer: Self-pay | Admitting: Pharmacist

## 2016-04-28 ENCOUNTER — Other Ambulatory Visit (HOSPITAL_COMMUNITY): Payer: Self-pay | Admitting: Pharmacist

## 2016-04-28 MED ORDER — SACUBITRIL-VALSARTAN 49-51 MG PO TABS: 1.0000 | ORAL_TABLET | Freq: Two times a day (BID) | ORAL | 3 refills | Status: DC

## 2016-04-28 NOTE — Telephone Encounter (Signed)
Updated Rx for Entresto 49-51 mg BID sent to [facility].   Tyler Deis. Bonnye Fava, PharmD, BCPS, CPP Clinical Pharmacist Pager: [phone] Phone: [phone] 04/28/2016 4:31 PM

## 2016-05-04 ENCOUNTER — Encounter: Payer: Self-pay | Admitting: Infectious Diseases

## 2016-05-10 ENCOUNTER — Ambulatory Visit: Payer: Self-pay | Admitting: Infectious Diseases

## 2016-05-11 ENCOUNTER — Other Ambulatory Visit (INDEPENDENT_AMBULATORY_CARE_PROVIDER_SITE_OTHER): Payer: Self-pay

## 2016-05-11 DIAGNOSIS — Z79899 Other long term (current) drug therapy: Secondary | ICD-10-CM

## 2016-05-11 DIAGNOSIS — B20 Human immunodeficiency virus [HIV] disease: Secondary | ICD-10-CM

## 2016-05-11 DIAGNOSIS — Z113 Encounter for screening for infections with a predominantly sexual mode of transmission: Secondary | ICD-10-CM

## 2016-05-11 LAB — CBC WITH DIFFERENTIAL/PLATELET
BASOS ABS: 0 {cells}/uL (ref 0–200)
Basophils Relative: 0 %
EOS PCT: 1 %
Eosinophils Absolute: 40 cells/uL (ref 15–500)
HEMATOCRIT: 42.4 % (ref 38.5–50.0)
HEMOGLOBIN: 13.7 g/dL (ref 13.2–17.1)
Lymphocytes Relative: 25 %
Lymphs Abs: 1000 cells/uL (ref 850–3900)
MCH: 32.3 pg (ref 27.0–33.0)
MCHC: 32.3 g/dL (ref 32.0–36.0)
MCV: 100 fL (ref 80.0–100.0)
MONOS PCT: 22 %
MPV: 11.3 fL (ref 7.5–12.5)
Monocytes Absolute: 880 cells/uL (ref 200–950)
NEUTROS ABS: 2080 {cells}/uL (ref 1500–7800)
NEUTROS PCT: 52 %
Platelets: 130 10*3/uL — ABNORMAL LOW (ref 140–400)
RBC: 4.24 MIL/uL (ref 4.20–5.80)
RDW: 14.7 % (ref 11.0–15.0)
WBC: 4 10*3/uL (ref 3.8–10.8)

## 2016-05-12 LAB — COMPLETE METABOLIC PANEL WITH GFR
ALBUMIN: 3.7 g/dL (ref 3.6–5.1)
ALT: 37 U/L (ref 9–46)
AST: 33 U/L (ref 10–40)
Alkaline Phosphatase: 89 U/L (ref 40–115)
BILIRUBIN TOTAL: 0.3 mg/dL (ref 0.2–1.2)
BUN: 11 mg/dL (ref 7–25)
CALCIUM: 9.3 mg/dL (ref 8.6–10.3)
CO2: 25 mmol/L (ref 20–31)
CREATININE: 1.49 mg/dL — AB (ref 0.60–1.35)
Chloride: 95 mmol/L — ABNORMAL LOW (ref 98–110)
GFR, EST AFRICAN AMERICAN: 67 mL/min (ref >=60)
GFR, Est Non African American: 58 mL/min — ABNORMAL LOW (ref >=60)
Glucose, Bld: 396 mg/dL — ABNORMAL HIGH (ref 65–99)
Potassium: 4.9 mmol/L (ref 3.5–5.3)
Sodium: 134 mmol/L — ABNORMAL LOW (ref 135–146)
TOTAL PROTEIN: 7.3 g/dL (ref 6.1–8.1)

## 2016-05-12 LAB — T-HELPER CELL (CD4) - (RCID CLINIC ONLY)
CD4 % Helper T Cell: 29 % — ABNORMAL LOW (ref 33–55)
CD4 T Cell Abs: 340 /uL — ABNORMAL LOW (ref 400–2700)

## 2016-05-12 LAB — LIPID PANEL
CHOL/HDL RATIO: 3.2 ratio (ref <5.0)
CHOLESTEROL: 247 mg/dL — AB (ref <200)
HDL: 77 mg/dL (ref >40)
LDL Cholesterol: 153 mg/dL — ABNORMAL HIGH (ref <100)
Triglycerides: 86 mg/dL (ref <150)
VLDL: 17 mg/dL (ref <30)

## 2016-05-12 LAB — RPR

## 2016-05-15 LAB — HIV-1 RNA QUANT-NO REFLEX-BLD
HIV 1 RNA Quant: <20 copies/mL — AB
HIV-1 RNA Quant, Log: <1.3 Log copies/mL — AB

## 2016-05-22 ENCOUNTER — Ambulatory Visit: Payer: Self-pay | Admitting: Infectious Diseases

## 2016-06-01 ENCOUNTER — Other Ambulatory Visit: Payer: Self-pay | Admitting: Infectious Diseases

## 2016-06-01 DIAGNOSIS — B20 Human immunodeficiency virus [HIV] disease: Secondary | ICD-10-CM

## 2016-06-07 ENCOUNTER — Ambulatory Visit: Payer: Self-pay | Admitting: Infectious Diseases

## 2016-06-11 ENCOUNTER — Inpatient Hospital Stay (HOSPITAL_COMMUNITY): Payer: Self-pay

## 2016-06-11 ENCOUNTER — Encounter (HOSPITAL_COMMUNITY): Payer: Self-pay

## 2016-06-11 ENCOUNTER — Emergency Department (HOSPITAL_COMMUNITY): Payer: Self-pay

## 2016-06-11 ENCOUNTER — Inpatient Hospital Stay (HOSPITAL_COMMUNITY)
Admission: EM | Admit: 2016-06-11 | Discharge: 2016-06-16 | DRG: 871 | Disposition: A | Discharge status: Home / Self Care | Payer: Self-pay | Attending: Internal Medicine | Admitting: Internal Medicine

## 2016-06-11 DIAGNOSIS — I428 Other cardiomyopathies: Secondary | ICD-10-CM | POA: Diagnosis present

## 2016-06-11 DIAGNOSIS — J9601 Acute respiratory failure with hypoxia: Secondary | ICD-10-CM | POA: Diagnosis present

## 2016-06-11 DIAGNOSIS — I5022 Chronic systolic (congestive) heart failure: Secondary | ICD-10-CM | POA: Diagnosis present

## 2016-06-11 DIAGNOSIS — Z7982 Long term (current) use of aspirin: Secondary | ICD-10-CM

## 2016-06-11 DIAGNOSIS — R000 Tachycardia, unspecified: Secondary | ICD-10-CM | POA: Diagnosis present

## 2016-06-11 DIAGNOSIS — E1165 Type 2 diabetes mellitus with hyperglycemia: Secondary | ICD-10-CM | POA: Diagnosis present

## 2016-06-11 DIAGNOSIS — R7989 Other specified abnormal findings of blood chemistry: Secondary | ICD-10-CM

## 2016-06-11 DIAGNOSIS — N189 Chronic kidney disease, unspecified: Secondary | ICD-10-CM

## 2016-06-11 DIAGNOSIS — I5043 Acute on chronic combined systolic (congestive) and diastolic (congestive) heart failure: Secondary | ICD-10-CM | POA: Diagnosis present

## 2016-06-11 DIAGNOSIS — F17210 Nicotine dependence, cigarettes, uncomplicated: Secondary | ICD-10-CM | POA: Diagnosis present

## 2016-06-11 DIAGNOSIS — Z823 Family history of stroke: Secondary | ICD-10-CM

## 2016-06-11 DIAGNOSIS — Z7984 Long term (current) use of oral hypoglycemic drugs: Secondary | ICD-10-CM

## 2016-06-11 DIAGNOSIS — I248 Other forms of acute ischemic heart disease: Secondary | ICD-10-CM | POA: Diagnosis present

## 2016-06-11 DIAGNOSIS — E860 Dehydration: Secondary | ICD-10-CM | POA: Diagnosis present

## 2016-06-11 DIAGNOSIS — E0865 Diabetes mellitus due to underlying condition with hyperglycemia: Secondary | ICD-10-CM | POA: Diagnosis present

## 2016-06-11 DIAGNOSIS — J189 Pneumonia, unspecified organism: Secondary | ICD-10-CM | POA: Diagnosis present

## 2016-06-11 DIAGNOSIS — I447 Left bundle-branch block, unspecified: Secondary | ICD-10-CM | POA: Diagnosis present

## 2016-06-11 DIAGNOSIS — N179 Acute kidney failure, unspecified: Secondary | ICD-10-CM | POA: Diagnosis present

## 2016-06-11 DIAGNOSIS — I110 Hypertensive heart disease with heart failure: Secondary | ICD-10-CM | POA: Diagnosis present

## 2016-06-11 DIAGNOSIS — Z992 Dependence on renal dialysis: Secondary | ICD-10-CM

## 2016-06-11 DIAGNOSIS — E871 Hypo-osmolality and hyponatremia: Secondary | ICD-10-CM | POA: Diagnosis present

## 2016-06-11 DIAGNOSIS — E1140 Type 2 diabetes mellitus with diabetic neuropathy, unspecified: Secondary | ICD-10-CM | POA: Diagnosis present

## 2016-06-11 DIAGNOSIS — F1011 Alcohol abuse, in remission: Secondary | ICD-10-CM

## 2016-06-11 DIAGNOSIS — E785 Hyperlipidemia, unspecified: Secondary | ICD-10-CM

## 2016-06-11 DIAGNOSIS — Z79899 Other long term (current) drug therapy: Secondary | ICD-10-CM

## 2016-06-11 DIAGNOSIS — R0602 Shortness of breath: Secondary | ICD-10-CM

## 2016-06-11 DIAGNOSIS — E1169 Type 2 diabetes mellitus with other specified complication: Secondary | ICD-10-CM

## 2016-06-11 DIAGNOSIS — B20 Human immunodeficiency virus [HIV] disease: Secondary | ICD-10-CM | POA: Diagnosis present

## 2016-06-11 DIAGNOSIS — Z833 Family history of diabetes mellitus: Secondary | ICD-10-CM

## 2016-06-11 DIAGNOSIS — Z21 Asymptomatic human immunodeficiency virus [HIV] infection status: Secondary | ICD-10-CM | POA: Diagnosis present

## 2016-06-11 DIAGNOSIS — J181 Lobar pneumonia, unspecified organism: Secondary | ICD-10-CM

## 2016-06-11 DIAGNOSIS — R778 Other specified abnormalities of plasma proteins: Secondary | ICD-10-CM

## 2016-06-11 DIAGNOSIS — A419 Sepsis, unspecified organism: Principal | ICD-10-CM | POA: Diagnosis present

## 2016-06-11 DIAGNOSIS — I10 Essential (primary) hypertension: Secondary | ICD-10-CM | POA: Diagnosis present

## 2016-06-11 LAB — URINALYSIS, ROUTINE W REFLEX MICROSCOPIC
BILIRUBIN URINE: NEGATIVE
Bacteria, UA: NONE SEEN
Glucose, UA: >=500 mg/dL — AB
Ketones, ur: 5 mg/dL — AB
Leukocytes, UA: NEGATIVE
Nitrite: NEGATIVE
Specific Gravity, Urine: 1.027 (ref 1.005–1.030)
pH: 6 (ref 5.0–8.0)

## 2016-06-11 LAB — BLOOD GAS, ARTERIAL
ACID-BASE EXCESS: 2.2 mmol/L — AB (ref 0.0–2.0)
Bicarbonate: 25.1 mmol/L (ref 20.0–28.0)
Drawn by: 347621
O2 Content: 2 L/min
O2 SAT: 96.9 %
PCO2 ART: 34.8 mmHg (ref 32.0–48.0)
PH ART: 7.481 — AB (ref 7.350–7.450)
Patient temperature: 102.8
pO2, Arterial: 104 mmHg (ref 83.0–108.0)

## 2016-06-11 LAB — I-STAT TROPONIN, ED: TROPONIN I, POC: 0.1 ng/mL — AB (ref 0.00–0.08)

## 2016-06-11 LAB — BRAIN NATRIURETIC PEPTIDE: B NATRIURETIC PEPTIDE 5: 1688.9 pg/mL — AB (ref 0.0–100.0)

## 2016-06-11 LAB — BASIC METABOLIC PANEL
Anion gap: 16 — ABNORMAL HIGH (ref 5–15)
BUN: 15 mg/dL (ref 6–20)
CALCIUM: 9 mg/dL (ref 8.9–10.3)
CO2: 21 mmol/L — AB (ref 22–32)
CREATININE: 1.28 mg/dL — AB (ref 0.61–1.24)
Chloride: 89 mmol/L — ABNORMAL LOW (ref 101–111)
GFR calc non Af Amer: >60 mL/min (ref >60)
Glucose, Bld: 382 mg/dL — ABNORMAL HIGH (ref 65–99)
Potassium: 4.3 mmol/L (ref 3.5–5.1)
SODIUM: 126 mmol/L — AB (ref 135–145)

## 2016-06-11 LAB — MAGNESIUM: MAGNESIUM: 1.7 mg/dL (ref 1.7–2.4)

## 2016-06-11 LAB — CBC WITH DIFFERENTIAL/PLATELET
BASOS PCT: 1 %
Basophils Absolute: 0 10*3/uL (ref 0.0–0.1)
EOS ABS: 0 10*3/uL (ref 0.0–0.7)
Eosinophils Relative: 0 %
HCT: 46.5 % (ref 39.0–52.0)
Hemoglobin: 16.5 g/dL (ref 13.0–17.0)
Lymphocytes Relative: 20 %
Lymphs Abs: 0.9 10*3/uL (ref 0.7–4.0)
MCH: 34 pg (ref 26.0–34.0)
MCHC: 35.5 g/dL (ref 30.0–36.0)
MCV: 95.7 fL (ref 78.0–100.0)
MONO ABS: 0.7 10*3/uL (ref 0.1–1.0)
MONOS PCT: 16 %
Neutro Abs: 2.7 10*3/uL (ref 1.7–7.7)
Neutrophils Relative %: 63 %
Platelets: 136 10*3/uL — ABNORMAL LOW (ref 150–400)
RBC: 4.86 MIL/uL (ref 4.22–5.81)
RDW: 13.2 % (ref 11.5–15.5)
WBC: 4.2 10*3/uL (ref 4.0–10.5)

## 2016-06-11 LAB — APTT: APTT: 32 s (ref 24–36)

## 2016-06-11 LAB — INFLUENZA PANEL BY PCR (TYPE A & B)
INFLAPCR: NEGATIVE
Influenza B By PCR: NEGATIVE

## 2016-06-11 LAB — PROTIME-INR
INR: 1.06
Prothrombin Time: 13.8 seconds (ref 11.4–15.2)

## 2016-06-11 LAB — TSH: TSH: 0.791 u[IU]/mL (ref 0.350–4.500)

## 2016-06-11 LAB — TROPONIN I: Troponin I: 0.13 ng/mL (ref <0.03)

## 2016-06-11 LAB — D-DIMER, QUANTITATIVE: D-Dimer, Quant: 3.07 ug/mL-FEU — ABNORMAL HIGH (ref 0.00–0.50)

## 2016-06-11 LAB — GLUCOSE, CAPILLARY: GLUCOSE-CAPILLARY: 380 mg/dL — AB (ref 65–99)

## 2016-06-11 LAB — I-STAT CG4 LACTIC ACID, ED: Lactic Acid, Venous: 2.66 mmol/L (ref 0.5–1.9)

## 2016-06-11 LAB — PROCALCITONIN: Procalcitonin: 0.23 ng/mL

## 2016-06-11 LAB — LACTIC ACID, PLASMA: Lactic Acid, Venous: 3 mmol/L (ref 0.5–1.9)

## 2016-06-11 MED ORDER — HYDRALAZINE HCL 20 MG/ML IJ SOLN
10.0000 mg | Freq: Once | INTRAMUSCULAR | Status: AC
  Administered 2016-06-11: 10 mg via INTRAVENOUS
  Filled 2016-06-11: qty 1

## 2016-06-11 MED ORDER — LORAZEPAM 2 MG/ML IJ SOLN
0.0000 mg | Freq: Four times a day (QID) | INTRAMUSCULAR | Status: AC
  Administered 2016-06-12 – 2016-06-13 (×2): 2 mg via INTRAVENOUS
  Filled 2016-06-11 (×2): qty 1

## 2016-06-11 MED ORDER — ADULT MULTIVITAMIN W/MINERALS CH
1.0000 | ORAL_TABLET | Freq: Every day | ORAL | Status: DC
  Administered 2016-06-12 – 2016-06-15 (×3): 1 via ORAL
  Filled 2016-06-11 (×4): qty 1

## 2016-06-11 MED ORDER — LORAZEPAM 1 MG PO TABS
1.0000 mg | ORAL_TABLET | Freq: Four times a day (QID) | ORAL | Status: AC | PRN
  Administered 2016-06-13 – 2016-06-14 (×2): 1 mg via ORAL
  Filled 2016-06-11 (×2): qty 1

## 2016-06-11 MED ORDER — FOLIC ACID 1 MG PO TABS
1.0000 mg | ORAL_TABLET | Freq: Every day | ORAL | Status: DC
  Administered 2016-06-12 – 2016-06-16 (×5): 1 mg via ORAL
  Filled 2016-06-11 (×5): qty 1

## 2016-06-11 MED ORDER — ACETAMINOPHEN 325 MG PO TABS: 650.0000 mg | ORAL_TABLET | Freq: Once | ORAL | Status: DC

## 2016-06-11 MED ORDER — ACETAMINOPHEN 325 MG PO TABS: 650.0000 mg | ORAL_TABLET | Freq: Four times a day (QID) | ORAL | Status: DC | PRN

## 2016-06-11 MED ORDER — DEXTROSE 5 % IV SOLN
1.0000 g | INTRAVENOUS | Status: DC
  Administered 2016-06-12 – 2016-06-15 (×4): 1 g via INTRAVENOUS
  Filled 2016-06-11 (×4): qty 10

## 2016-06-11 MED ORDER — ACETAMINOPHEN 650 MG RE SUPP: 650.0000 mg | Freq: Four times a day (QID) | RECTAL | Status: DC | PRN

## 2016-06-11 MED ORDER — ASPIRIN 325 MG PO TABS
325.0000 mg | ORAL_TABLET | Freq: Every day | ORAL | Status: DC
  Administered 2016-06-12 (×2): 325 mg via ORAL
  Filled 2016-06-11 (×2): qty 1

## 2016-06-11 MED ORDER — INSULIN ASPART 100 UNIT/ML ~~LOC~~ SOLN
0.0000 [IU] | Freq: Three times a day (TID) | SUBCUTANEOUS | Status: DC
Start: 2016-06-12 — End: 2016-06-12
  Administered 2016-06-12: 5 [IU] via SUBCUTANEOUS
  Administered 2016-06-12: 3 [IU] via SUBCUTANEOUS

## 2016-06-11 MED ORDER — ALBUTEROL SULFATE (2.5 MG/3ML) 0.083% IN NEBU: 5.0000 mg | INHALATION_SOLUTION | Freq: Once | RESPIRATORY_TRACT | Status: DC

## 2016-06-11 MED ORDER — ACETAMINOPHEN 325 MG PO TABS
650.0000 mg | ORAL_TABLET | Freq: Once | ORAL | Status: AC
  Administered 2016-06-11: 650 mg via ORAL
  Filled 2016-06-11: qty 2

## 2016-06-11 MED ORDER — DEXTROSE 5 % IV SOLN
500.0000 mg | INTRAVENOUS | Status: DC
  Administered 2016-06-12 – 2016-06-15 (×4): 500 mg via INTRAVENOUS
  Filled 2016-06-11 (×4): qty 500

## 2016-06-11 MED ORDER — LORAZEPAM 2 MG/ML IJ SOLN: 1.0000 mg | Freq: Four times a day (QID) | INTRAMUSCULAR | Status: AC | PRN

## 2016-06-11 MED ORDER — VITAMIN B-1 100 MG PO TABS
100.0000 mg | ORAL_TABLET | Freq: Every day | ORAL | Status: DC
  Administered 2016-06-12 – 2016-06-16 (×5): 100 mg via ORAL
  Filled 2016-06-11 (×5): qty 1

## 2016-06-11 MED ORDER — LORAZEPAM 2 MG/ML IJ SOLN: 0.0000 mg | Freq: Two times a day (BID) | INTRAMUSCULAR | Status: AC

## 2016-06-11 MED ORDER — ONDANSETRON HCL 4 MG PO TABS: 4.0000 mg | ORAL_TABLET | Freq: Four times a day (QID) | ORAL | Status: DC | PRN

## 2016-06-11 MED ORDER — ENOXAPARIN SODIUM 40 MG/0.4ML ~~LOC~~ SOLN
40.0000 mg | SUBCUTANEOUS | Status: DC
  Administered 2016-06-12 – 2016-06-16 (×5): 40 mg via SUBCUTANEOUS
  Filled 2016-06-11 (×6): qty 0.4

## 2016-06-11 MED ORDER — EMTRICITABINE-TENOFOVIR AF 200-25 MG PO TABS
1.0000 | ORAL_TABLET | Freq: Every day | ORAL | Status: DC
  Administered 2016-06-12 – 2016-06-16 (×5): 1 via ORAL
  Filled 2016-06-11 (×5): qty 1

## 2016-06-11 MED ORDER — IOPAMIDOL (ISOVUE-370) INJECTION 76%
INTRAVENOUS | Status: AC
  Administered 2016-06-11: 100 mL
  Filled 2016-06-11: qty 100

## 2016-06-11 MED ORDER — ISOSORBIDE MONONITRATE ER 60 MG PO TB24
60.0000 mg | ORAL_TABLET | Freq: Every day | ORAL | Status: DC
  Administered 2016-06-12 – 2016-06-16 (×5): 60 mg via ORAL
  Filled 2016-06-11 (×5): qty 1

## 2016-06-11 MED ORDER — SODIUM CHLORIDE 0.9 % IV BOLUS (SEPSIS)
1000.0000 mL | Freq: Once | INTRAVENOUS | Status: AC
  Administered 2016-06-11: 1000 mL via INTRAVENOUS

## 2016-06-11 MED ORDER — HYDRALAZINE HCL 50 MG PO TABS
100.0000 mg | ORAL_TABLET | Freq: Two times a day (BID) | ORAL | Status: DC
  Administered 2016-06-12 – 2016-06-16 (×10): 100 mg via ORAL
  Filled 2016-06-11 (×10): qty 2

## 2016-06-11 MED ORDER — THIAMINE HCL 100 MG/ML IJ SOLN: 100.0000 mg | Freq: Every day | INTRAMUSCULAR | Status: DC

## 2016-06-11 MED ORDER — DEXTROSE 5 % IV SOLN
1.0000 g | Freq: Once | INTRAVENOUS | Status: AC
  Administered 2016-06-11: 1 g via INTRAVENOUS
  Filled 2016-06-11: qty 10

## 2016-06-11 MED ORDER — DOLUTEGRAVIR SODIUM 50 MG PO TABS
50.0000 mg | ORAL_TABLET | Freq: Every day | ORAL | Status: DC
  Administered 2016-06-12 – 2016-06-16 (×5): 50 mg via ORAL
  Filled 2016-06-11 (×5): qty 1

## 2016-06-11 MED ORDER — ONDANSETRON HCL 4 MG/2ML IJ SOLN: 4.0000 mg | Freq: Four times a day (QID) | INTRAMUSCULAR | Status: DC | PRN

## 2016-06-11 MED ORDER — ASPIRIN 81 MG PO TBEC: 81.0000 mg | DELAYED_RELEASE_TABLET | Freq: Every day | ORAL | Status: DC

## 2016-06-11 MED ORDER — AZITHROMYCIN 500 MG IV SOLR
500.0000 mg | Freq: Once | INTRAVENOUS | Status: AC
  Administered 2016-06-11: 500 mg via INTRAVENOUS
  Filled 2016-06-11: qty 500

## 2016-06-11 NOTE — ED Notes (Signed)
Dinner Tray ordered 

## 2016-06-11 NOTE — Progress Notes (Signed)
Patient came from ED. He complained of chills and fever. Tele placed. Vitals were:  102.8 temp 164/112 b/p 126 pulse  RN received two critical labs for lactic acid and troponin. RN also saw d-dimer elevated. MD Toniann Fail came to patient's room to assess him- these labs were given to the doctor verbally. He placed orders for STAT CT angio and ABG. A one time dose of tylenol was then given to patient @ 2243. MD placed orders for stepdown. Patient will be transferred when he returns from CT. Will continue to monitor

## 2016-06-11 NOTE — Progress Notes (Signed)
CRITICAL VALUE ALERT  Critical value received: Troponin  0.13  Date of notification:  3/4  Time of notification:  1:51pm  Critical value read back:Yes.    Nurse who received alert:  Marlon Pel   MD notified (1st page):  Toniann Fail, MD  Time of first page:  10:52pm  MD notified (2nd page):  Time of second page:  Responding MD:  Awaiting call back   Time MD responded:

## 2016-06-11 NOTE — ED Provider Notes (Addendum)
[facility]-EMERGENCY DEPT Provider Note   CSN: [id number] Arrival date & time: 06/11/16  1457     History   Chief Complaint Chief Complaint  Patient presents with  . Shortness of Breath    HPI Shaun Brennan is a 41 y.o. male hx of HIV (CD4 340 and viral load low a month ago), Mild hypertension, diabetes here presenting with worsening shortness of breath, fever. Patient states that he has been having subjective chills for the last 3 days. Also has worsening shortness of breath and cough the last 3 days. Denies any chest pain. Patient states that he has a history of heart failure but this is different than them before. Denies any leg swelling. He did not take his blood pressure medicine for the last week or so. Patient does follow up with [facility].   The history is provided by the patient.    Past Medical History:  Diagnosis Date  . CHF (congestive heart failure) (HCC)   . Chronic systolic heart failure (HCC)    a. EF 15-20%, grade II DD, LA mild/mod dilated  . Diabetes mellitus without complication (HCC)   . DM2 (diabetes mellitus, type 2) (HCC)   . HIV (human immunodeficiency virus infection) (HCC)   . Hypertension     Patient Active Problem List   Diagnosis Date Noted  . Cough   . SOB (shortness of breath)   . Shortness of breath 01/17/2016  . Accelerated hypertension   . Hyperlipidemia 11/10/2015  . Diabetic neuropathy (HCC) 11/10/2015  . History of ETOH abuse 09/08/2015  . Suicidal ideation 07/14/2014  . Major depression 07/14/2014  . Depression, major, single episode, moderate (HCC) 07/11/2014  . Community acquired pneumonia 07/07/2014  . Community acquired pneumonia of right lower lobe of lung (HCC) 07/07/2014  . HTN (hypertension) 12/17/2013  . Chronic systolic heart failure (HCC) 12/17/2013  . Diabetes mellitus due to underlying condition without complications (HCC) 12/12/2013  . Smoking 12/12/2013  . Acute on chronic systolic heart failure  (HCC) 40/98/1191  . Acute respiratory distress 12/06/2013  . 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 DM2 (diabetes mellitus, type 2) (HCC) 12/06/2013  . HIV (human immunodeficiency virus infection) (HCC) 12/06/2013  . H/O CHF 12/06/2013  . Cardiomegaly: per cxr 12/06/13 12/06/2013    Past Surgical History:  Procedure Laterality Date  . ANKLE SURGERY Right        Home Medications    Prior to Admission medications   Medication Sig Start Date End Date Taking? Authorizing Provider  DESCOVY 200-25 MG tablet TAKE 1 TABLET BY MOUTH EVERY DAY 06/01/16  Yes Ginnie Smart, MD  hydrALAZINE (APRESOLINE) 50 MG tablet Take 2 tablets (100 mg total) by mouth 2 (two) times daily. 04/25/16  Yes [name] D Clegg, NP  isosorbide mononitrate (IMDUR) 60 MG 24 hr tablet Take 1 tablet (60 mg total) by mouth daily. 04/25/16  Yes [name] D Clegg, NP  sacubitril-valsartan (ENTRESTO) 49-51 MG Take 1 tablet by mouth 2 (two) times daily. 04/28/16  Yes Dolores Patty, MD  TIVICAY 50 MG tablet TAKE 1 TABLET BY MOUTH EVERY DAY 06/01/16  Yes Ginnie Smart, MD  aspirin EC 81 MG EC tablet Take 1 tablet (81 mg total) by mouth daily. Patient not taking: Reported on 04/25/2016 01/21/16   Freddrick March, MD  atorvastatin (LIPITOR) 20 MG tablet Take 1 tablet (20 mg total) by mouth daily. Patient not taking: Reported on 06/11/2016 11/10/15   Jaclyn Shaggy, MD  carvedilol (COREG) 25 MG tablet  TAKE 1 TABLET BY MOUTH 2 TIMES DAILY WITH A MEAL. Patient not taking: Reported on 06/11/2016 10/06/15   Dolores Patty, MD  furosemide (LASIX) 40 MG tablet Take 1 tablet (40 mg total) by mouth 2 (two) times a week. On Monday and Friday Patient not taking: Reported on 06/11/2016 04/27/16   [name] D Clegg, NP  guaiFENesin (MUCINEX) 600 MG 12 hr tablet Take 1 tablet (600 mg total) by mouth 2 (two) times daily. Patient not taking: Reported on 06/11/2016 01/20/16   Freddrick March, MD  metFORMIN (GLUCOPHAGE) 500 MG tablet Take 2 tablets (1,000 mg total) by mouth  2 (two) times daily with a meal. Patient not taking: Reported on 06/11/2016 11/10/15   Jaclyn Shaggy, MD    Family History Family History  Problem Relation Age of Onset  . Other Father   . Diabetes Mother   . Stroke Paternal Grandfather     Social History Social History  Substance Use Topics  . Smoking status: Current Every Day Smoker    Packs/day: 0.25    Types: Cigarettes  . Smokeless tobacco: Never Used     Comment: about 10 cigarettes a week  . Alcohol use 14.4 oz/week    12 Cans of beer, 12 Shots of liquor per week     Allergies   Lisinopril   Review of Systems Review of Systems  Respiratory: Positive for shortness of breath.   All other systems reviewed and are negative.    Physical Exam Updated Vital Signs BP 129/83   Pulse 115   Temp 100.8 F (38.2 C)   Resp 20   SpO2 96%   Physical Exam  Constitutional:  Chronically ill   HENT:  Head: Normocephalic.  MM slightly dry   Eyes: EOM are normal. Pupils are equal, round, and reactive to light.  Neck: Normal range of motion. Neck supple.  Cardiovascular: Normal rate, regular rhythm and normal heart sounds.   Pulmonary/Chest:  Slightly tachypneic, diminished bilateral bases   Abdominal: Soft. Bowel sounds are normal. He exhibits no distension. There is no tenderness. There is no guarding.  Musculoskeletal: Normal range of motion.  Neurological: He is alert.  Skin: Skin is warm.  Psychiatric: He has a normal mood and affect.  Nursing note and vitals reviewed.    ED Treatments / Results  Labs (all labs ordered are listed, but only abnormal results are displayed) Labs Reviewed  CBC WITH DIFFERENTIAL/PLATELET - Abnormal; Notable for the following:       Result Value   Platelets 136 (*)    All other components within normal limits  BASIC METABOLIC PANEL - Abnormal; Notable for the following:    Sodium 126 (*)    Chloride 89 (*)    CO2 21 (*)    Glucose, Bld 382 (*)    Creatinine, Ser 1.28 (*)     Anion gap 16 (*)    All other components within normal limits  I-STAT TROPOININ, ED - Abnormal; Notable for the following:    Troponin i, poc 0.10 (*)    All other components within normal limits  I-STAT CG4 LACTIC ACID, ED - Abnormal; Notable for the following:    Lactic Acid, Venous 2.66 (*)    All other components within normal limits  CULTURE, BLOOD (ROUTINE X 2)  CULTURE, BLOOD (ROUTINE X 2)  URINE CULTURE  INFLUENZA PANEL BY PCR (TYPE A & B)  URINALYSIS, ROUTINE W REFLEX MICROSCOPIC  BRAIN NATRIURETIC PEPTIDE    EKG  EKG  Interpretation  Date/Time:  Sunday June 11 2016 16:56:15 EST Ventricular Rate:  124 PR Interval:    QRS Duration: 163 QT Interval:  442 QTC Calculation: 635 R Axis:   -74 Text Interpretation:  Sinus tachycardia Consider right atrial enlargement Left bundle branch block Baseline wander in lead(s) V6 rate faster  Confirmed by YAO  MD, DAVID ([id number]) on 06/11/2016 5:15:55 PM       Radiology Dg Chest 2 View  Result Date: 06/11/2016 CLINICAL DATA:  Shortness of breath. EXAM: CHEST  2 VIEW COMPARISON:  Radiographs of January 19, 2016. FINDINGS: The heart size and mediastinal contours are within normal limits. No pneumothorax or pleural effusion is noted. Stable small calcified granulomata are noted in both lungs. No acute pulmonary disease is noted. The visualized skeletal structures are unremarkable. IMPRESSION: No active cardiopulmonary disease. Electronically Signed   By: Lupita Raider, M.D.   On: 06/11/2016 15:52    Procedures Procedures (including critical care time)  Medications Ordered in ED Medications  acetaminophen (TYLENOL) tablet 650 mg (650 mg Oral Not Given 06/11/16 1755)  cefTRIAXone (ROCEPHIN) 1 g in dextrose 5 % 50 mL IVPB (1 g Intravenous New Bag/Given 06/11/16 1805)  azithromycin (ZITHROMAX) 500 mg in dextrose 5 % 250 mL IVPB (not administered)  acetaminophen (TYLENOL) tablet 650 mg (650 mg Oral Given 06/11/16 1646)  sodium chloride 0.9 %  bolus 1,000 mL (1,000 mLs Intravenous New Bag/Given 06/11/16 1755)  hydrALAZINE (APRESOLINE) injection 10 mg (10 mg Intravenous Given 06/11/16 1755)     Initial Impression / Assessment and Plan / ED Course  I have reviewed the triage vital signs and the nursing notes.  Pertinent labs & imaging results that were available during my care of the patient were reviewed by me and considered in my medical decision making (see chart for details).     Shaun Brennan is a 41 y.o. male here with SOB, cough, fever. Febrile in the ED. Slightly tachypneic and tachycardic. Meets sepsis criteria. Will do sepsis workup. Also has EF 30% on last echo. Will hold off on 30 cc/kg bolus. Will give 1 L NS bolus and give rocephin, azithro empirically. Will swab for flu.   6:26 PM WBC nl. Na 126, Cr 1.28. Glucose 382. AG 16. I think likely dehydration. No vomiting to suggest DKA. CXR clear but clinically has pneumonia. Since he has HIV (CD4 340), will treat empirically. His CD 4 is not low enough to cause PCP pneumonia so will not treat for PCP. Flu swab. Lactate 2.6, given 1 L NS bolus. Trop 0.1 likely from demand ischemia. Will admit to tele for pneumonia, hyponatremia, dehydration, elevated trop.   Final Clinical Impressions(s) / ED Diagnoses   Final diagnoses:  None    New Prescriptions New Prescriptions   No medications on file     Charlynne Pander, MD 06/11/16 1828    Charlynne Pander, MD 06/11/16 [id number]

## 2016-06-11 NOTE — Progress Notes (Signed)
Pharmacy Antibiotic Note  Akshay Fairbairn is a 41 y.o. male admitted on 06/11/2016 with pneumonia.  Pharmacy has been consulted for Ceftriaxone dosing. WBC WNL. Scr 1.28. Lactic acid elevated.   Plan: -Ceftriaxone 1g IV q24h -Azithromycin 500 mg IV q24h -Trend WBC, temp, renal function  -F/U infectious work-up  Height: 5\' 5"  (165.1 cm) Weight: 188 lb 4.4 oz (85.4 kg) IBW/kg (Calculated) : 61.5  Temp (24hrs), Avg:100.9 F (38.3 C), Min:98.8 F (37.1 C), Max:102.8 F (39.3 C)   Recent Labs Lab 06/11/16 1514 06/11/16 1809 06/11/16 2117  WBC 4.2  --   --   CREATININE 1.28*  --   --   LATICACIDVEN  --  2.66* 3.0*    Estimated Creatinine Clearance: 77.9 mL/min (by C-G formula based on SCr of 1.28 mg/dL (H)).    Allergies  Allergen Reactions  . Lisinopril Cough    Abran Duke 06/11/2016 11:22 PM

## 2016-06-11 NOTE — Progress Notes (Signed)
CRITICAL VALUE ALERT  Critical value received:  Lactic 3.07  Date of notification:  3/4  Time of notification:  10:23pm  Critical value read back:Yes.    Nurse who received alert:  Marlon Pel   MD notified (1st page):  Toniann Fail, MD (on unit)  Time of first page:  10:25pm  MD notified (2nd page):  Time of second page:  Responding MD:   Time MD responded:

## 2016-06-11 NOTE — Progress Notes (Signed)
  Pt admitted to the unit. Pt is stable, alert and oriented per baseline. Oriented to room, staff, and call bell. Educated to call for any assistance. Bed in lowest position, call bell within reach- will continue to monitor. 

## 2016-06-11 NOTE — H&P (Addendum)
History and Physical    Shaun Brennan ZOX:[id number] DOB: July 23, 1975 DOA: 06/11/2016  PCP: Jaclyn Shaggy, MD  Patient coming from: Home.  Chief Complaint: Shortness of breath.  HPI: Shaun Brennan is a 41 y.o. male with history of HIV, nonischemic cardiomyopathy, diabetes mellitus type 2 and hypertension presents to the ER because of worsening shortness of breath with fever and chills over the last 3-4 days. Denies any chest pain abdominal pain nausea vomiting.   ED Course: In the ER patient was found to be febrile and tachycardic but chest x-ray was unremarkable. Patient states his shortness of breath increases on exertion and lying down. Since patient was febrile patient was empirically started on antibiotics for community-acquired pneumonia after blood cultures were obtained. Patient is being admitted for acute respiratory failure with possible developing sepsis from pneumonia.  Review of Systems: As per HPI, rest all negative.   Past Medical History:  Diagnosis Date  . CHF (congestive heart failure) (HCC)   . Chronic systolic heart failure (HCC)    a. EF 15-20%, grade II DD, LA mild/mod dilated  . Diabetes mellitus without complication (HCC)   . DM2 (diabetes mellitus, type 2) (HCC)   . HIV (human immunodeficiency virus infection) (HCC)   . Hypertension     Past Surgical History:  Procedure Laterality Date  . ANKLE SURGERY Right      reports that he has been smoking Cigarettes.  He has been smoking about 0.25 packs per day. He has never used smokeless tobacco. He reports that he drinks about 14.4 oz of alcohol per week . He reports that he uses drugs, including Marijuana.  Allergies  Allergen Reactions  . Lisinopril Cough    Family History  Problem Relation Age of Onset  . Other Father   . Diabetes Mother   . Stroke Paternal Grandfather     Prior to Admission medications   Medication Sig Start Date End Date Taking? Authorizing Provider  DESCOVY 200-25 MG tablet  TAKE 1 TABLET BY MOUTH EVERY DAY 06/01/16  Yes Ginnie Smart, MD  hydrALAZINE (APRESOLINE) 50 MG tablet Take 2 tablets (100 mg total) by mouth 2 (two) times daily. 04/25/16  Yes [name] D Clegg, NP  isosorbide mononitrate (IMDUR) 60 MG 24 hr tablet Take 1 tablet (60 mg total) by mouth daily. 04/25/16  Yes [name] D Clegg, NP  sacubitril-valsartan (ENTRESTO) 49-51 MG Take 1 tablet by mouth 2 (two) times daily. 04/28/16  Yes Dolores Patty, MD  TIVICAY 50 MG tablet TAKE 1 TABLET BY MOUTH EVERY DAY 06/01/16  Yes Ginnie Smart, MD  aspirin EC 81 MG EC tablet Take 1 tablet (81 mg total) by mouth daily. Patient not taking: Reported on 04/25/2016 01/21/16   Freddrick March, MD  atorvastatin (LIPITOR) 20 MG tablet Take 1 tablet (20 mg total) by mouth daily. Patient not taking: Reported on 06/11/2016 11/10/15   Jaclyn Shaggy, MD  carvedilol (COREG) 25 MG tablet TAKE 1 TABLET BY MOUTH 2 TIMES DAILY WITH A MEAL. Patient not taking: Reported on 06/11/2016 10/06/15   Dolores Patty, MD  furosemide (LASIX) 40 MG tablet Take 1 tablet (40 mg total) by mouth 2 (two) times a week. On Monday and Friday Patient not taking: Reported on 06/11/2016 04/27/16   [name] D Clegg, NP  guaiFENesin (MUCINEX) 600 MG 12 hr tablet Take 1 tablet (600 mg total) by mouth 2 (two) times daily. Patient not taking: Reported on 06/11/2016 01/20/16   Freddrick March, MD  metFORMIN (  GLUCOPHAGE) 500 MG tablet Take 2 tablets (1,000 mg total) by mouth 2 (two) times daily with a meal. Patient not taking: Reported on 06/11/2016 11/10/15   Jaclyn Shaggy, MD    Physical Exam: Vitals:   06/11/16 1930 06/11/16 2030 06/11/16 2130 06/11/16 2132  BP: (!) 167/109 (!) 144/111 (!) 164/112 (!) 155/114  Pulse: (!) 121 (!) 123 (!) 126   Resp: (!) 30 (!) 37 18   Temp:   (!) 102.8 F (39.3 C)   TempSrc:   Oral   SpO2: 95% 96% 98%   Weight:    85.4 kg (188 lb 4.4 oz)  Height:    5\' 5"  (1.651 m)      Constitutional: Moderately built and nourished. Vitals:   06/11/16  1930 06/11/16 2030 06/11/16 2130 06/11/16 2132  BP: (!) 167/109 (!) 144/111 (!) 164/112 (!) 155/114  Pulse: (!) 121 (!) 123 (!) 126   Resp: (!) 30 (!) 37 18   Temp:   (!) 102.8 F (39.3 C)   TempSrc:   Oral   SpO2: 95% 96% 98%   Weight:    85.4 kg (188 lb 4.4 oz)  Height:    5\' 5"  (1.651 m)   Eyes: Anicteric no pallor. ENMT: No discharge from the ears eyes nose and mouth. Neck: No mass felt. No neck rigidity. No JVD appreciated. Respiratory: No rhonchi or crepitations. Cardiovascular: S1-S2 regular no murmurs appreciated. Abdomen: Soft nontender bowel sounds present. Musculoskeletal: No edema. No joint effusion. Skin: No rash or skin appears warm. Neurologic: Alert awake oriented to time place and person. Moves all extremities. Psychiatric: Appears normal. Normal affect.   Labs on Admission: I have personally reviewed following labs and imaging studies  CBC:  Recent Labs Lab 06/11/16 1514  WBC 4.2  NEUTROABS 2.7  HGB 16.5  HCT 46.5  MCV 95.7  PLT 136*   Basic Metabolic Panel:  Recent Labs Lab 06/11/16 1514  NA 126*  K 4.3  CL 89*  CO2 21*  GLUCOSE 382*  BUN 15  CREATININE 1.28*  CALCIUM 9.0   GFR: Estimated Creatinine Clearance: 77.9 mL/min (by C-G formula based on SCr of 1.28 mg/dL (H)). Liver Function Tests: No results for input(s): AST, ALT, ALKPHOS, BILITOT, PROT, ALBUMIN in the last 168 hours. No results for input(s): LIPASE, AMYLASE in the last 168 hours. No results for input(s): AMMONIA in the last 168 hours. Coagulation Profile: No results for input(s): INR, PROTIME in the last 168 hours. Cardiac Enzymes:  Recent Labs Lab 06/11/16 2117  TROPONINI 0.13*   BNP (last 3 results) No results for input(s): PROBNP in the last 8760 hours. HbA1C: No results for input(s): HGBA1C in the last 72 hours. CBG:  Recent Labs Lab 06/11/16 2132  GLUCAP 380*   Lipid Profile: No results for input(s): CHOL, HDL, LDLCALC, TRIG, CHOLHDL, LDLDIRECT in the  last 72 hours. Thyroid Function Tests:  Recent Labs  06/11/16 2117  TSH 0.791   Anemia Panel: No results for input(s): VITAMINB12, FOLATE, FERRITIN, TIBC, IRON, RETICCTPCT in the last 72 hours. Urine analysis:    Component Value Date/Time   COLORURINE YELLOW 06/11/2016 1800   APPEARANCEUR CLEAR 06/11/2016 1800   LABSPEC 1.027 06/11/2016 1800   PHURINE 6.0 06/11/2016 1800   GLUCOSEU >=500 (A) 06/11/2016 1800   HGBUR MODERATE (A) 06/11/2016 1800   BILIRUBINUR NEGATIVE 06/11/2016 1800   KETONESUR 5 (A) 06/11/2016 1800   PROTEINUR >=300 (A) 06/11/2016 1800   UROBILINOGEN 0.2 12/20/2013 1321   NITRITE NEGATIVE  06/11/2016 1800   LEUKOCYTESUR NEGATIVE 06/11/2016 1800   Sepsis Labs: @LABRCNTIP (procalcitonin:4,lacticidven:4) )No results found for this or any previous visit (from the past 240 hour(s)).   Radiological Exams on Admission: Dg Chest 2 View  Result Date: 06/11/2016 CLINICAL DATA:  Shortness of breath. EXAM: CHEST  2 VIEW COMPARISON:  Radiographs of January 19, 2016. FINDINGS: The heart size and mediastinal contours are within normal limits. No pneumothorax or pleural effusion is noted. Stable small calcified granulomata are noted in both lungs. No acute pulmonary disease is noted. The visualized skeletal structures are unremarkable. IMPRESSION: No active cardiopulmonary disease. Electronically Signed   By: Lupita Raider, M.D.   On: 06/11/2016 15:52    EKG: Independently reviewed. Sinus tachycardia with LBBB.  Assessment/Plan Principal Problem:   Acute respiratory failure with hypoxia (HCC) Active Problems:   HIV (human immunodeficiency virus infection) (HCC)   HTN (hypertension)   Chronic systolic heart failure (HCC)   History of ETOH abuse   Sepsis (HCC)   Controlled type 2 diabetes mellitus with hyperglycemia (HCC)    1. Acute respiratory failure with hypoxia with possible developing sepsis secondary to pneumonia - since patient is short of breath unit dressed  with elevated d-dimer I am ordering a CT angiogram of the chest at this time. We'll continue with ceftriaxone and Zithromax for now. Follow blood cultures urine cultures urine for Legionella and strep antigen. Influenza PCR S come back as negative. Since patient has HIV will check LDH levels and if elevated may start Bactrim for PCP coverage. Patient's shortness of breath could also be contributed from CHF but at this time patient looks more septic so I'm holding off patient's diuretics. Trend lactate levels checked procalcitonin levels. 2. Nonischemic cardiomyopathy last EF measured was 40-45% with history of hypertension - continue with hydralazine and Imdur. We'll hold off Enteresto since patient is receiving IV dye for CT angiogram. If creatinine is stable and blood pressure stable restart Enteresto. 3. HIV - check CD4 count. As per the infectious disease notes patient has been compliant with his medication. See #1. Continue antiretrovirals. 4. Diabetes mellitus type 2 with hyperglycemia - we'll place patient on Lantus while in hospital along with sliding scale coverage to closely follow metabolic panel for any worsening anion gap. 5. History of alcohol abuse - will place patient on CIWA protocol.   DVT prophylaxis: Lovenox. Code Status: Full code.  Family Communication: Discussed with patient.  Disposition Plan: Home.  Consults called: None.  Admission status: Inpatient.    Eduard Clos MD [facility] Pager [phone].  If 7PM-7AM, please contact night-coverage [email or web address] Password [facility]  06/11/2016, 10:56 PM

## 2016-06-11 NOTE — Progress Notes (Signed)
NP has been paged concerning pt's vitals and other orders. Awaiting call back.

## 2016-06-11 NOTE — ED Notes (Signed)
The pt has a cold cough generalized body aches since Wednesday  He has pain in his chest whenever he coughs  Rapid respirations from coughing    Chest congested

## 2016-06-11 NOTE — ED Notes (Signed)
He has not had his bp med for one week

## 2016-06-11 NOTE — ED Triage Notes (Signed)
Patient complains of increasing shortness of breath for the past 3 days with chest pain. States that he has CHF and states that this seems different from that, tachypnea on arrival, skin warm and dry

## 2016-06-11 NOTE — Progress Notes (Signed)
Tylenol given for temp of 102.8

## 2016-06-11 NOTE — ED Notes (Signed)
Temp down 

## 2016-06-12 LAB — CBC WITH DIFFERENTIAL/PLATELET
BASOS ABS: 0 10*3/uL (ref 0.0–0.1)
Basophils Relative: 0 %
EOS PCT: 0 %
Eosinophils Absolute: 0 10*3/uL (ref 0.0–0.7)
HEMATOCRIT: 44.8 % (ref 39.0–52.0)
Hemoglobin: 15.6 g/dL (ref 13.0–17.0)
Lymphocytes Relative: 25 %
Lymphs Abs: 1.2 10*3/uL (ref 0.7–4.0)
MCH: 33.7 pg (ref 26.0–34.0)
MCHC: 34.8 g/dL (ref 30.0–36.0)
MCV: 96.8 fL (ref 78.0–100.0)
MONO ABS: 0.7 10*3/uL (ref 0.1–1.0)
MONOS PCT: 16 %
NEUTROS ABS: 2.8 10*3/uL (ref 1.7–7.7)
Neutrophils Relative %: 59 %
Platelets: 123 10*3/uL — ABNORMAL LOW (ref 150–400)
RBC: 4.63 MIL/uL (ref 4.22–5.81)
RDW: 13.6 % (ref 11.5–15.5)
WBC: 4.8 10*3/uL (ref 4.0–10.5)

## 2016-06-12 LAB — COMPREHENSIVE METABOLIC PANEL
ALT: 21 U/L (ref 17–63)
AST: 27 U/L (ref 15–41)
Albumin: 2.9 g/dL — ABNORMAL LOW (ref 3.5–5.0)
Alkaline Phosphatase: 62 U/L (ref 38–126)
Anion gap: 7 (ref 5–15)
BILIRUBIN TOTAL: 0.5 mg/dL (ref 0.3–1.2)
BUN: 16 mg/dL (ref 6–20)
CO2: 29 mmol/L (ref 22–32)
CREATININE: 1.41 mg/dL — AB (ref 0.61–1.24)
Calcium: 8.7 mg/dL — ABNORMAL LOW (ref 8.9–10.3)
Chloride: 94 mmol/L — ABNORMAL LOW (ref 101–111)
Glucose, Bld: 345 mg/dL — ABNORMAL HIGH (ref 65–99)
POTASSIUM: 5.5 mmol/L — AB (ref 3.5–5.1)
Sodium: 130 mmol/L — ABNORMAL LOW (ref 135–145)
TOTAL PROTEIN: 7.2 g/dL (ref 6.5–8.1)

## 2016-06-12 LAB — GLUCOSE, CAPILLARY
GLUCOSE-CAPILLARY: 221 mg/dL — AB (ref 65–99)
Glucose-Capillary: 267 mg/dL — ABNORMAL HIGH (ref 65–99)
Glucose-Capillary: 259 mg/dL — ABNORMAL HIGH (ref 65–99)
Glucose-Capillary: 228 mg/dL — ABNORMAL HIGH (ref 65–99)

## 2016-06-12 LAB — URINE CULTURE: Culture: NO GROWTH

## 2016-06-12 LAB — TROPONIN I
Troponin I: 0.11 ng/mL (ref <0.03)
Troponin I: 0.08 ng/mL (ref <0.03)

## 2016-06-12 LAB — T-HELPER CELLS (CD4) COUNT (NOT AT ARMC)
CD4 % Helper T Cell: 45 % (ref 33–55)
CD4 T CELL ABS: 410 /uL (ref 400–2700)

## 2016-06-12 LAB — LACTATE DEHYDROGENASE: LDH: 265 U/L — ABNORMAL HIGH (ref 98–192)

## 2016-06-12 LAB — MRSA PCR SCREENING: MRSA BY PCR: NEGATIVE

## 2016-06-12 LAB — LACTIC ACID, PLASMA: Lactic Acid, Venous: 1.6 mmol/L (ref 0.5–1.9)

## 2016-06-12 MED ORDER — ORAL CARE MOUTH RINSE
15.0000 mL | Freq: Two times a day (BID) | OROMUCOSAL | Status: DC
  Administered 2016-06-13 – 2016-06-16 (×6): 15 mL via OROMUCOSAL

## 2016-06-12 MED ORDER — ASPIRIN 81 MG PO CHEW
81.0000 mg | CHEWABLE_TABLET | Freq: Every day | ORAL | Status: DC
  Administered 2016-06-12 – 2016-06-16 (×5): 81 mg via ORAL
  Filled 2016-06-12 (×5): qty 1

## 2016-06-12 MED ORDER — INSULIN GLARGINE 100 UNIT/ML ~~LOC~~ SOLN
5.0000 [IU] | Freq: Once | SUBCUTANEOUS | Status: AC
  Administered 2016-06-12: 5 [IU] via SUBCUTANEOUS
  Filled 2016-06-12: qty 0.05

## 2016-06-12 MED ORDER — INSULIN ASPART 100 UNIT/ML ~~LOC~~ SOLN
0.0000 [IU] | Freq: Three times a day (TID) | SUBCUTANEOUS | Status: DC
  Administered 2016-06-12: 5 [IU] via SUBCUTANEOUS
  Administered 2016-06-13: 7 [IU] via SUBCUTANEOUS
  Administered 2016-06-13: 5 [IU] via SUBCUTANEOUS
  Administered 2016-06-13: 2 [IU] via SUBCUTANEOUS
  Administered 2016-06-14: 3 [IU] via SUBCUTANEOUS
  Administered 2016-06-14: 2 [IU] via SUBCUTANEOUS
  Administered 2016-06-14: 9 [IU] via SUBCUTANEOUS
  Administered 2016-06-15: 1 [IU] via SUBCUTANEOUS
  Administered 2016-06-15: 2 [IU] via SUBCUTANEOUS
  Administered 2016-06-15: 1 [IU] via SUBCUTANEOUS
  Administered 2016-06-16 (×2): 2 [IU] via SUBCUTANEOUS

## 2016-06-12 MED ORDER — INSULIN GLARGINE 100 UNIT/ML ~~LOC~~ SOLN
5.0000 [IU] | Freq: Every day | SUBCUTANEOUS | Status: DC
Start: 2016-06-12 — End: 2016-06-14
  Administered 2016-06-12 – 2016-06-13 (×2): 5 [IU] via SUBCUTANEOUS
  Filled 2016-06-12 (×2): qty 0.05

## 2016-06-12 MED ORDER — INSULIN ASPART 100 UNIT/ML ~~LOC~~ SOLN
0.0000 [IU] | Freq: Every day | SUBCUTANEOUS | Status: DC
  Administered 2016-06-12: 2 [IU] via SUBCUTANEOUS
  Administered 2016-06-13 – 2016-06-15 (×3): 3 [IU] via SUBCUTANEOUS

## 2016-06-12 MED ORDER — SODIUM CHLORIDE 0.9 % IV SOLN
INTRAVENOUS | Status: DC
Start: 2016-06-12 — End: 2016-06-14
  Administered 2016-06-12 – 2016-06-13 (×2): via INTRAVENOUS

## 2016-06-12 NOTE — Care Management Note (Addendum)
Case Management Note  Patient Details  Name: Shaun Brennan MRN: [id number] Date of Birth: 11-22-1975  Subjective/Objective:    From home , presents with sepsis secondary to pna, Acute resp failure with hypoxia, has hx of 042, htn, chf, hx of etoh. No insurance listed  May need med ast at dc.  He lives with a room mate, pta indep, he uses a cane, he states he [facility] and he gets his metformin from there, [facility], patient should be able to go to [facility] [place] on [address] to pick up his HAART meds and if he needs them delivered to him he could have them do that also, he has transportation at dc.  NCM will cont to follow for dc needs.                Action/Plan:   Expected Discharge Date:  06/15/16               Expected Discharge Plan:  Home/Self Care  In-House Referral:     Discharge planning Services  CM Consult, Medication Assistance  Post Acute Care Choice:    Choice offered to:     DME Arranged:    DME Agency:     HH Arranged:    HH Agency:     Status of Service:  In process, will continue to follow  If discussed at Long Length of Stay Meetings, dates discussed:    Additional Comments:  Leone Haven, RN 06/12/2016, 9:03 AM

## 2016-06-12 NOTE — Progress Notes (Signed)
Shaun Brennan  Shaun Brennan  FDV:[id number] DOB: 1975/12/19 DOA: 06/11/2016 PCP: Jaclyn Shaggy, MD    Brief Narrative:  41 y.o. male with history of HIV, nonischemic cardiomyopathy, DM2 and HTN who presented to the ER w/ shortness of breath with fever and chills over 3-4 days.  In the ER patient was found to be febrile and tachycardic but chest x-ray was unremarkable.   Subjective: The patient states he does not feel significantly better than he did at the time of his admission.  He denies new complaints.  He denies nausea vomiting or abdominal pain.  Assessment & Plan:  Acute respiratory failure with hypoxia due to multifocal PNA Cont empiric abx tx - follow clinical course   Nonischemic Chronic systolic and diastolic CHF EF 99-87% w/ grade 1 DD via TTE Oct 2017 - followed in CHF clinic - no signif volume overload on exam at this time - follow daily weights and Is/Os - baseline wgt 90.9kg as of Jan 2018 Filed Weights   06/11/16 2132 06/12/16 0500  Weight: 85.4 kg (188 lb 4.4 oz) 86.1 kg (189 lb 13.1 oz)     Hyponatremia  Likely actually related to hypovolemia - cont gentle hydration and follow - improving w/ volume expansion   HIV Followed in ID clinic - CD4 count 410 - VL <20 as of 05/11/16 - continue usual home meds   DM2 CBG poorly controlled - adjust tx and follow   Hx of EtOH abuse   DVT prophylaxis: lovenox Code Status: FULL CODE Family Communication:  Disposition Plan: SDU - will likely be stable for transfer to med bed in AM   Consultants:  none  Procedures: none  Antimicrobials:  Rocephin 3/4 > Azithro 3/4 >  Objective: Blood pressure (!) 134/103, pulse (!) 102, temperature 98.1 F (36.7 C), temperature source Oral, resp. rate 20, height 5\' 5"  (1.651 m), weight 86.1 kg (189 lb 13.1 oz), SpO2 95 %.  Intake/Output Summary (Last 24 hours) at 06/12/16 1154 Last data filed at 06/12/16 0559  Gross per 24 hour  Intake             1050  ml  Output             1050 ml  Net                0 ml   Filed Weights   06/11/16 2132 06/12/16 0500  Weight: 85.4 kg (188 lb 4.4 oz) 86.1 kg (189 lb 13.1 oz)    Examination: General: No acute respiratory distress at rest in bed  Lungs: Bibasilar crackles with no wheezing Cardiovascular: Regular rate and rhythm without murmur gallop or rub normal S1 and S2 Abdomen: Nontender, nondistended, soft, bowel sounds positive, no rebound, no ascites, no appreciable mass Extremities: No significant cyanosis, clubbing, or edema bilateral lower extremities  CBC:  Recent Labs Lab 06/11/16 1514 06/12/16 0324  WBC 4.2 4.8  NEUTROABS 2.7 2.8  HGB 16.5 15.6  HCT 46.5 44.8  MCV 95.7 96.8  PLT 136* 123*   Basic Metabolic Panel:  Recent Labs Lab 06/11/16 1514 06/11/16 2117 06/12/16 0324  NA 126*  --  130*  K 4.3  --  5.5*  CL 89*  --  94*  CO2 21*  --  29  GLUCOSE 382*  --  345*  BUN 15  --  16  CREATININE 1.28*  --  1.41*  CALCIUM 9.0  --  8.7*  MG  --  1.7  --    GFR: Estimated Creatinine Clearance: 70.9 mL/min (by C-G formula based on SCr of 1.41 mg/dL (H)).  Liver Function Tests:  Recent Labs Lab 06/12/16 0324  AST 27  ALT 21  ALKPHOS 62  BILITOT 0.5  PROT 7.2  ALBUMIN 2.9*    Coagulation Profile:  Recent Labs Lab 06/11/16 2117  INR 1.06    Cardiac Enzymes:  Recent Labs Lab 06/11/16 2117 06/12/16 0324 06/12/16 0722  TROPONINI 0.13* 0.11* 0.08*    HbA1C: Hgb A1c MFr Bld  Date/Time Value Ref Range Status  01/17/2016 02:06 PM 10.3 (H) 4.8 - 5.6 % Final    Comment:    (NOTE)         Pre-diabetes: 5.7 - 6.4         Diabetes: >6.4         Glycemic control for adults with diabetes: <7.0   09/21/2015 02:49 PM 9.6 (H) <5.7 % Final    Comment:      For someone without known diabetes, a hemoglobin A1c value of 6.5% or greater indicates that they may have diabetes and this should be confirmed with a follow-up test.   For someone with known  diabetes, a value <7% indicates that their diabetes is well controlled and a value greater than or equal to 7% indicates suboptimal control. A1c targets should be individualized based on duration of diabetes, age, comorbid conditions, and other considerations.   Currently, no consensus exists for use of hemoglobin A1c for diagnosis of diabetes for children.       CBG:  Recent Labs Lab 06/11/16 2132 06/12/16 0744  GLUCAP 380* 221*    Recent Results (from the past 240 hour(s))  MRSA PCR Screening     Status: None   Collection Time: 06/12/16 12:21 AM  Result Value Ref Range Status   MRSA by PCR NEGATIVE NEGATIVE Final    Comment:        The GeneXpert MRSA Assay (FDA approved for NASAL specimens only), is one component of a comprehensive MRSA colonization surveillance program. It is not intended to diagnose MRSA infection nor to guide or monitor treatment for MRSA infections.      Scheduled Meds: . aspirin  325 mg Oral Daily  . azithromycin  500 mg Intravenous Q24H  . cefTRIAXone (ROCEPHIN)  IV  1 g Intravenous Q24H  . dolutegravir  50 mg Oral Daily  . emtricitabine-tenofovir AF  1 tablet Oral Daily  . enoxaparin (LOVENOX) injection  40 mg Subcutaneous Q24H  . folic acid  1 mg Oral Daily  . hydrALAZINE  100 mg Oral BID  . insulin aspart  0-9 Units Subcutaneous TID WC  . insulin glargine  5 Units Subcutaneous QHS  . isosorbide mononitrate  60 mg Oral Daily  . LORazepam  0-4 mg Intravenous Q6H   Followed by  . [START ON 06/14/2016] LORazepam  0-4 mg Intravenous Q12H  . multivitamin with minerals  1 tablet Oral Daily  . thiamine  100 mg Oral Daily   Or  . thiamine  100 mg Intravenous Daily     LOS: 1 day   Lonia Blood, MD [facility] Office  [phone] Pager - Text Page per Amion as per below:  On-Call/Text Page:      [email or web address]      password [email or web address]  If 7PM-7AM, please contact night-coverage [email or web address] Password [email or web address] 06/12/2016, 11:54  AM

## 2016-06-12 NOTE — Progress Notes (Addendum)
Inpatient Diabetes Program Recommendations  AACE/ADA: New Consensus Statement on Inpatient Glycemic Control (2015)  Target Ranges:  Prepandial:   less than 140 mg/dL      Peak postprandial:   less than 180 mg/dL (1-2 hours)      Critically ill patients:  140 - 180 mg/dL  Results for JESUSALBERTO, GREISS (MRN [id number]) as of 06/12/2016 09:26  Ref. Range 06/12/2016 07:44  Glucose-Capillary Latest Ref Range: 65 - 99 mg/dL 048 (H)   Results for CLAVIN, JOHNSEY (MRN [id number]) as of 06/12/2016 09:26  Ref. Range 06/11/2016 15:14 06/12/2016 03:24  Glucose Latest Ref Range: 65 - 99 mg/dL 388 (H) 828 (H)   Results for KAIVEN, NEASON (MRN [id number]) as of 06/12/2016 09:26  Ref. Range 01/17/2016 14:06  Hemoglobin A1C Latest Ref Range: 4.8 - 5.6 % 10.3 (H)   Review of Glycemic Control  Diabetes history: DM2 Outpatient Diabetes medications: Metformin 1000 mg BID Current orders for Inpatient glycemic control: Lantus 5 units QHS, Novolog 0-9 units TID with meals  Inpatient Diabetes Program Recommendations: Correction (SSI): Please consider ordering Novolog 0-5 units QHS for bedtime correction. HgbA1C: Please add an A1C to blood in lab to evaluate glycemic control over the past 2-3 months.  Thanks, Orlando Penner, RN, MSN, CDE Diabetes Coordinator Inpatient Diabetes Program [phone] (Team Pager from 8am to 5pm)

## 2016-06-13 LAB — CBC
HEMATOCRIT: 39.1 % (ref 39.0–52.0)
Hemoglobin: 13.1 g/dL (ref 13.0–17.0)
MCH: 32.7 pg (ref 26.0–34.0)
MCHC: 33.5 g/dL (ref 30.0–36.0)
MCV: 97.5 fL (ref 78.0–100.0)
Platelets: 113 10*3/uL — ABNORMAL LOW (ref 150–400)
RBC: 4.01 MIL/uL — ABNORMAL LOW (ref 4.22–5.81)
RDW: 13.2 % (ref 11.5–15.5)
WBC: 4 10*3/uL (ref 4.0–10.5)

## 2016-06-13 LAB — GLUCOSE, CAPILLARY
Glucose-Capillary: 321 mg/dL — ABNORMAL HIGH (ref 65–99)
Glucose-Capillary: 251 mg/dL — ABNORMAL HIGH (ref 65–99)
Glucose-Capillary: 196 mg/dL — ABNORMAL HIGH (ref 65–99)
Glucose-Capillary: 263 mg/dL — ABNORMAL HIGH (ref 65–99)

## 2016-06-13 LAB — COMPREHENSIVE METABOLIC PANEL
ALK PHOS: 76 U/L (ref 38–126)
ALT: 20 U/L (ref 17–63)
ANION GAP: 6 (ref 5–15)
AST: 26 U/L (ref 15–41)
Albumin: 2.4 g/dL — ABNORMAL LOW (ref 3.5–5.0)
BILIRUBIN TOTAL: 0.3 mg/dL (ref 0.3–1.2)
BUN: 21 mg/dL — ABNORMAL HIGH (ref 6–20)
CALCIUM: 9.1 mg/dL (ref 8.9–10.3)
CO2: 28 mmol/L (ref 22–32)
CREATININE: 0.95 mg/dL (ref 0.61–1.24)
Chloride: 99 mmol/L — ABNORMAL LOW (ref 101–111)
GFR calc non Af Amer: >60 mL/min (ref >60)
GLUCOSE: 217 mg/dL — AB (ref 65–99)
Potassium: 4.1 mmol/L (ref 3.5–5.1)
Sodium: 133 mmol/L — ABNORMAL LOW (ref 135–145)
TOTAL PROTEIN: 5.8 g/dL — AB (ref 6.5–8.1)

## 2016-06-13 LAB — STREP PNEUMONIAE URINARY ANTIGEN: Strep Pneumo Urinary Antigen: POSITIVE — AB

## 2016-06-13 NOTE — Progress Notes (Signed)
Pt transferred uneventfully to 6E18. VSS. Pt's partner at bedside and aware of transfer.

## 2016-06-13 NOTE — Progress Notes (Addendum)
Inpatient Diabetes Program Recommendations  AACE/ADA: New Consensus Statement on Inpatient Glycemic Control (2015)  Target Ranges:  Prepandial:   less than 140 mg/dL      Peak postprandial:   less than 180 mg/dL (1-2 hours)      Critically ill patients:  140 - 180 mg/dL  Results for Shaun Brennan, Shaun Brennan (MRN [id number]) as of 06/13/2016 07:02  Ref. Range 06/13/2016 03:53  Glucose Latest Ref Range: 65 - 99 mg/dL 130 (H)   Results for Shaun Brennan, Shaun Brennan (MRN [id number]) as of 06/13/2016 07:02  Ref. Range 06/12/2016 07:44 06/12/2016 13:16 06/12/2016 16:29 06/12/2016 21:04  Glucose-Capillary Latest Ref Range: 65 - 99 mg/dL 295 (H) 284 (H) 132 (H) 228 (H)  Results for Shaun Brennan, Shaun Brennan (MRN [id number]) as of 06/13/2016 07:02  Ref. Range 06/11/2016 15:14  Glucose Latest Ref Range: 65 - 99 mg/dL 366 (H)   Review of Glycemic Control  Diabetes history: DM2 Outpatient Diabetes medications: Metformin 1000 mg BID Current orders for Inpatient glycemic control: Lantus 5 units QHS, Novolog 0-9 units TID with meals, Novolog 0-5 units QHS  Inpatient Diabetes Program Recommendations:  Insulin - Basal: Patient received Lantus 5 units at 8:44 am and Lantus 5 units at 22:43 on 06/12/16. Fasting lab glucose is 217 mg/dl this morning. Please consider increasing Lantus to 17 units QHS (based on 86 kg x 0.2 units). Insulin - Meal Coverage: Please consider ordering Novolog 4 units TID with meals for meal coverage if patient eats at least 50% of meals. HgbA1C: Please add on an A1C to blood in lab if available (or draw with next scheduled blood draw) to evaluate glycemic control over the past 2-3 months. Outpatient Follow Up: Initial glucose was 382 mg/dl on arrival to hospital and glucose ranged from 217-267 mg/dl over the past 24 hours with addition of Lantus and Novolog. Anticipate patient needs additional medication(s) for DM control.  MD may want to consider discharging patient on additional medication(s) for DM control. If so, please  consider cost as patient has no insurance.  Recommend patient follow up with PCP to improve glycemic control.   Addendum 06/13/16@8 :57-Spoke with patient over the phone regarding DM and importance of DM control. Patient reports that he goes to [facility] for [facility] and that he is prescribed Metformin 1000 mg BID but he reports that he has been out of Metformin for at least a week. Patient reports that he was taking Metformin as prescribed "most of the time" when he had the medication. Patient reports that he can not afford to get a refill on his Metformin. Inquired about cost of Metformin and patient reports it cost "$40". Explained to patient that Metformin is a very affordable medication and it should be cheaper than that and encouraged patient to check again with pharmacy at the clinic. Patient states that he does not have a glucometer so he does not check his glucose at all.  Inquired about prior A1C and patient reports that he does not know what an A1C value is. Explained what an A1C is and discussed last  A1C results in the chart  (10.3% on 01/17/16) and explained that his last A1C indicated an average glucose of 249 mg/dl.  Discussed glucose and A1C goals. Discussed initial glucose of 382 mg/dl on presentation to hospital and glucose ranges of 221-267 mg/dl over the past 24 hours with insulin. Explained that patient may likely need additional DM medications for DM control.  Discussed importance of checking CBGs and maintaining good  CBG control to prevent long-term and short-term complications. Explained how hyperglycemia leads to damage within blood vessels which lead to the common complications seen with uncontrolled diabetes. Stressed to the patient the importance of improving glycemic control to prevent further complications from uncontrolled diabetes.Patient reports that he can not afford anything and will need assistance getting medication and supplies for glucose  monitoring. Informed patient, CM is already involved and is following to determine if  he is eligible for any type of medication assistance.  Patient verbalized understanding of information discussed and he states that he has no further questions at this time related to diabetes.   MD, please provide patient with Rx for glucometer and testing supplies at time of discharge so he can take it to [facility] to get filled.   Thanks, Orlando Penner, RN, MSN, CDE Diabetes Coordinator Inpatient Diabetes Program [phone] (Team Pager from 8am to 5pm)

## 2016-06-13 NOTE — Progress Notes (Addendum)
Shaun Brennan TEAM 1 - Stepdown/ICU TEAM  Shaun Brennan  WVP:[id number] DOB: 11-14-1975 DOA: 06/11/2016 PCP: Jaclyn Shaggy, MD    Brief Narrative:  41 y.o. male with history of HIV, nonischemic cardiomyopathy, DM2 and HTN who presented to the ER w/ shortness of breath with fever and chills over 3-4 days.  In the ER patient was found to be febrile and tachycardic but chest x-ray was unremarkable.   Subjective: Patient denies any complaints this morning and states he feels a little better than yesterday. He was afebrile overnight.  Assessment & Plan:  Acute respiratory failure with hypoxia due to multifocal PNA Cont empiric abx tx - follow clinical course . Continue ceftriaxone and azithromycin Supplemental oxygen as needed. Would also recommend ambulatory pulse ox prior to his discharge. Due to hyponatremia I will also check for urine Legionella. Urine strep He is slightly hypoxic which I think is likely from community-acquired pneumonia and unlikely PCP pneumonia.  Nonischemic Chronic systolic and diastolic CHF-compensated EF 40-45% w/ grade 1 DD via TTE Oct 2017 - followed in CHF clinic - no signif volume overload on exam at this time - follow daily weights and Is/Os - baseline wgt 90.9kg as of Jan 2018 [facility] Weights   06/11/16 2132 06/12/16 0500  Weight: 85.4 kg (188 lb 4.4 oz) 86.1 kg (189 lb 13.1 oz)     Hyponatremia  Likely related to intravascular volume depletion. It has been improving with IV fluids We will continue IV fluids at this time and monitor sodium daily  HIV Followed in ID clinic - CD4 count 410 - VL <20 as of 05/11/16 - continue usual home meds  Continue Tivicay and descovy  DM2 CBG poorly controlled - adjust tx and follow   Hx of EtOH abuse   DVT prophylaxis: lovenox Code Status: FULL CODE Family Communication:  Disposition Plan: Transfer to [facility]  Consultants:  none  Procedures: none  Antimicrobials:  Rocephin 3/4 > Azithro 3/4  >  Objective: Blood pressure (!) 151/106, pulse (!) 102, temperature 97.6 F (36.4 C), temperature source Oral, resp. rate 20, height 5\' 5"  (1.651 m), weight 86.1 kg (189 lb 13.1 oz), SpO2 (!) 89 %.  Intake/Output Summary (Last 24 hours) at 06/13/16 1256 Last data filed at 06/13/16 0749  Gross per 24 hour  Intake           413.33 ml  Output              900 ml  Net          -486.67 ml   Filed Weights   06/11/16 2132 06/12/16 0500  Weight: 85.4 kg (188 lb 4.4 oz) 86.1 kg (189 lb 13.1 oz)    Examination: General: No acute respiratory distress at rest in bed  Lungs: minimal Bibasilar crackles with no wheezing Cardiovascular: Regular rate and rhythm without murmur gallop or rub normal S1 and S2 Abdomen: Nontender, nondistended, soft, bowel sounds positive, no rebound, no ascites, no appreciable mass Extremities: No significant cyanosis, clubbing, or edema bilateral lower extremities  CBC:  Recent Labs Lab 06/11/16 1514 06/12/16 0324 06/13/16 0353  WBC 4.2 4.8 4.0  NEUTROABS 2.7 2.8  --   HGB 16.5 15.6 13.1  HCT 46.5 44.8 39.1  MCV 95.7 96.8 97.5  PLT 136* 123* 113*   Basic Metabolic Panel:  Recent Labs Lab 06/11/16 1514 06/11/16 2117 06/12/16 0324 06/13/16 0353  NA 126*  --  130* 133*  K 4.3  --  5.5* 4.1  CL 89*  --  94* 99*  CO2 21*  --  29 28  GLUCOSE 382*  --  345* 217*  BUN 15  --  16 21*  CREATININE 1.28*  --  1.41* 0.95  CALCIUM 9.0  --  8.7* 9.1  MG  --  1.7  --   --    GFR: Estimated Creatinine Clearance: 105.3 mL/min (by C-G formula based on SCr of 0.95 mg/dL).  Liver Function Tests:  Recent Labs Lab 06/12/16 0324 06/13/16 0353  AST 27 26  ALT 21 20  ALKPHOS 62 76  BILITOT 0.5 0.3  PROT 7.2 5.8*  ALBUMIN 2.9* 2.4*    Coagulation Profile:  Recent Labs Lab 06/11/16 2117  INR 1.06    Cardiac Enzymes:  Recent Labs Lab 06/11/16 2117 06/12/16 0324 06/12/16 0722  TROPONINI 0.13* 0.11* 0.08*    HbA1C: Hgb A1c MFr Bld   Date/Time Value Ref Range Status  01/17/2016 02:06 PM 10.3 (H) 4.8 - 5.6 % Final    Comment:    (NOTE)         Pre-diabetes: 5.7 - 6.4         Diabetes: >6.4         Glycemic control for adults with diabetes: <7.0   09/21/2015 02:49 PM 9.6 (H) <5.7 % Final    Comment:      For someone without known diabetes, a hemoglobin A1c value of 6.5% or greater indicates that they may have diabetes and this should be confirmed with a follow-up test.   For someone with known diabetes, a value <7% indicates that their diabetes is well controlled and a value greater than or equal to 7% indicates suboptimal control. A1c targets should be individualized based on duration of diabetes, age, comorbid conditions, and other considerations.   Currently, no consensus exists for use of hemoglobin A1c for diagnosis of diabetes for children.       CBG:  Recent Labs Lab 06/12/16 1316 06/12/16 1629 06/12/16 2104 06/13/16 0746 06/13/16 1157  GLUCAP 267* 259* 228* 321* 251*    Recent Results (from the past 240 hour(s))  Blood culture (routine x 2)     Status: None (Preliminary result)   Collection Time: 06/11/16  5:54 PM  Result Value Ref Range Status   Specimen Description BLOOD LEFT ANTECUBITAL  Final   Special Requests BOTTLES DRAWN AEROBIC AND ANAEROBIC 5CC  Final   Culture NO GROWTH < 24 HOURS  Final   Report Status PENDING  Incomplete  Blood culture (routine x 2)     Status: None (Preliminary result)   Collection Time: 06/11/16  5:57 PM  Result Value Ref Range Status   Specimen Description BLOOD RIGHT FOREARM  Final   Special Requests AEROBIC BOTTLE ONLY 5CC  Final   Culture NO GROWTH < 24 HOURS  Final   Report Status PENDING  Incomplete  Urine culture     Status: None   Collection Time: 06/11/16  6:00 PM  Result Value Ref Range Status   Specimen Description URINE, CLEAN CATCH  Final   Special Requests NONE  Final   Culture NO GROWTH  Final   Report Status 06/12/2016 FINAL  Final   MRSA PCR Screening     Status: None   Collection Time: 06/12/16 12:21 AM  Result Value Ref Range Status   MRSA by PCR NEGATIVE NEGATIVE Final    Comment:        The GeneXpert MRSA Assay (FDA approved for NASAL specimens only), is one component of  a comprehensive MRSA colonization surveillance program. It is not intended to diagnose MRSA infection nor to guide or monitor treatment for MRSA infections.      Scheduled Meds: . aspirin  81 mg Oral Daily  . azithromycin  500 mg Intravenous Q24H  . cefTRIAXone (ROCEPHIN)  IV  1 g Intravenous Q24H  . dolutegravir  50 mg Oral Daily  . emtricitabine-tenofovir AF  1 tablet Oral Daily  . enoxaparin (LOVENOX) injection  40 mg Subcutaneous Q24H  . folic acid  1 mg Oral Daily  . hydrALAZINE  100 mg Oral BID  . insulin aspart  0-5 Units Subcutaneous QHS  . insulin aspart  0-9 Units Subcutaneous TID WC  . insulin glargine  5 Units Subcutaneous QHS  . isosorbide mononitrate  60 mg Oral Daily  . LORazepam  0-4 mg Intravenous Q6H   Followed by  . [START ON 06/14/2016] LORazepam  0-4 mg Intravenous Q12H  . mouth rinse  15 mL Mouth Rinse BID  . multivitamin with minerals  1 tablet Oral Daily  . thiamine  100 mg Oral Daily   Or  . thiamine  100 mg Intravenous Daily     LOS: 2 days   Stephania Fragmin MD [phone]  On-Call/Text Page:      [email or web address]      password [email or web address]  If 7PM-7AM, please contact night-coverage [email or web address] Password [email or web address] 06/13/2016, 12:56 PM

## 2016-06-14 ENCOUNTER — Inpatient Hospital Stay: Payer: Self-pay | Admitting: Critical Care Medicine

## 2016-06-14 ENCOUNTER — Inpatient Hospital Stay (HOSPITAL_COMMUNITY): Payer: Self-pay

## 2016-06-14 DIAGNOSIS — Z992 Dependence on renal dialysis: Secondary | ICD-10-CM

## 2016-06-14 DIAGNOSIS — N189 Chronic kidney disease, unspecified: Secondary | ICD-10-CM

## 2016-06-14 DIAGNOSIS — I5023 Acute on chronic systolic (congestive) heart failure: Secondary | ICD-10-CM

## 2016-06-14 DIAGNOSIS — E871 Hypo-osmolality and hyponatremia: Secondary | ICD-10-CM

## 2016-06-14 DIAGNOSIS — Z794 Long term (current) use of insulin: Secondary | ICD-10-CM

## 2016-06-14 DIAGNOSIS — J189 Pneumonia, unspecified organism: Secondary | ICD-10-CM

## 2016-06-14 DIAGNOSIS — N179 Acute kidney failure, unspecified: Secondary | ICD-10-CM

## 2016-06-14 DIAGNOSIS — E0865 Diabetes mellitus due to underlying condition with hyperglycemia: Secondary | ICD-10-CM

## 2016-06-14 DIAGNOSIS — B20 Human immunodeficiency virus [HIV] disease: Secondary | ICD-10-CM

## 2016-06-14 DIAGNOSIS — J181 Lobar pneumonia, unspecified organism: Secondary | ICD-10-CM

## 2016-06-14 LAB — BASIC METABOLIC PANEL
Anion gap: 9 (ref 5–15)
BUN: 19 mg/dL (ref 6–20)
CHLORIDE: 100 mmol/L — AB (ref 101–111)
CO2: 23 mmol/L (ref 22–32)
CREATININE: 0.93 mg/dL (ref 0.61–1.24)
Calcium: 8.8 mg/dL — ABNORMAL LOW (ref 8.9–10.3)
GFR calc Af Amer: >60 mL/min (ref >60)
GFR calc non Af Amer: >60 mL/min (ref >60)
Glucose, Bld: 234 mg/dL — ABNORMAL HIGH (ref 65–99)
Potassium: 3.9 mmol/L (ref 3.5–5.1)
Sodium: 132 mmol/L — ABNORMAL LOW (ref 135–145)

## 2016-06-14 LAB — MAGNESIUM: Magnesium: 1.7 mg/dL (ref 1.7–2.4)

## 2016-06-14 LAB — GLUCOSE, CAPILLARY
GLUCOSE-CAPILLARY: 239 mg/dL — AB (ref 65–99)
GLUCOSE-CAPILLARY: 371 mg/dL — AB (ref 65–99)
Glucose-Capillary: 189 mg/dL — ABNORMAL HIGH (ref 65–99)
Glucose-Capillary: 271 mg/dL — ABNORMAL HIGH (ref 65–99)

## 2016-06-14 MED ORDER — INSULIN ASPART 100 UNIT/ML ~~LOC~~ SOLN
4.0000 [IU] | Freq: Three times a day (TID) | SUBCUTANEOUS | Status: DC
  Administered 2016-06-14: 4 [IU] via SUBCUTANEOUS

## 2016-06-14 MED ORDER — GUAIFENESIN-DM 100-10 MG/5ML PO SYRP
5.0000 mL | ORAL_SOLUTION | ORAL | Status: DC | PRN
  Administered 2016-06-14 – 2016-06-15 (×4): 5 mL via ORAL
  Filled 2016-06-14 (×4): qty 5

## 2016-06-14 MED ORDER — INSULIN GLARGINE 100 UNIT/ML ~~LOC~~ SOLN
17.0000 [IU] | Freq: Every day | SUBCUTANEOUS | Status: DC
  Administered 2016-06-14: 17 [IU] via SUBCUTANEOUS
  Filled 2016-06-14: qty 0.17

## 2016-06-14 MED ORDER — FUROSEMIDE 10 MG/ML IJ SOLN
40.0000 mg | Freq: Once | INTRAMUSCULAR | Status: AC
  Administered 2016-06-14: 40 mg via INTRAVENOUS
  Filled 2016-06-14: qty 4

## 2016-06-14 MED ORDER — DM-GUAIFENESIN ER 30-600 MG PO TB12
1.0000 | ORAL_TABLET | Freq: Two times a day (BID) | ORAL | Status: DC
  Administered 2016-06-14 – 2016-06-16 (×5): 1 via ORAL
  Filled 2016-06-14 (×5): qty 1

## 2016-06-14 NOTE — Progress Notes (Signed)
Inpatient Diabetes Program Recommendations  AACE/ADA: New Consensus Statement on Inpatient Glycemic Control (2015)  Target Ranges:  Prepandial:   less than 140 mg/dL      Peak postprandial:   less than 180 mg/dL (1-2 hours)      Critically ill patients:  140 - 180 mg/dL   Results for Shaun Brennan, Shaun Brennan (MRN [id number]) as of 06/14/2016 14:19  Ref. Range 06/14/2016 07:46 06/14/2016 11:16  Glucose-Capillary Latest Ref Range: 65 - 99 mg/dL 859 (H) 292 (H)   Review of Glycemic Control  Diabetes history: DM 2 Outpatient Diabetes medications: Metformin Current orders for Inpatient glycemic control: Lantus 5 units, Novolog Sensitive + HS  Inpatient Diabetes Program Recommendations:  Insulin - Basal: Fasting lab glucose is 239 mg/dl this morning. Please consider increasing Lantus to 17 units QHS (based on 86 kg x 0.2 units).  Insulin - Meal Coverage: Please consider ordering Novolog 4 units TID with meals for meal coverage if patient eats at least 50% of meals.  Rounded on patient to verify if he had questions and to review drawing up insulin and insulin administration. Patient reports "he already knows how to administer insulin as he was on in (both vial and syringe and insulin pen) at one time when he was first diagnosed with DM."  Thanks,  Christena Deem RN, MSN, [facility] Inpatient Diabetes Coordinator Team Pager [phone] (8a-5p)

## 2016-06-14 NOTE — Progress Notes (Signed)
PROGRESS NOTE                                                                                                                                                                                                             Patient Demographics:    Shaun Brennan, is a 41 y.o. male, DOB - 1975/12/16, WUJ:[id number]  Admit date - 06/11/2016   Admitting Physician Eduard Clos, MD  Outpatient Primary MD for the patient is Jaclyn Shaggy, MD  LOS - 3  Outpatient Specialists: [facility]  Chief Complaint  Patient presents with  . Shortness of Breath       Brief Narrative   41 year old male with HIV, nonischemic cardiomyopathy type 2 diabetes mellitus and hypertension recently to the ED with shortness of breath with fever and chills for past 3-4 days found to be febrile and tachycardic with acute hypoxic respiratory failure secondary to multifocal pneumonia. Required stepdown admission.   Subjective:   Complains of productive cough with whitish all morning.   Assessment  & Plan :    Principal Problem:   Acute respiratory failure with hypoxia/ Sepsis (HCC) Secondary to multifocal pneumonia. Empiric Rocephin and azithromycin. Cultures negative. Urine strep antigen negative. Legionella antigen pending. Monitor O2 sat. When necessary nebs  Active Problems: Nonischemic cardiomyopathy with mild acute systolic  CHF Persistent cough possibly due to mild volume overload. Ordered a dose of IV Lasix. EF of 40-45% with grade 1 diastolic dysfunction on  last echo. Monitor strict I/O and daily weight.    HIV (human immunodeficiency virus infection) (HCC) CD4 of 410., VL< 20. Continue Tivicay and descovy      Unontrolled type 2 diabetes mellitus with hyperglycemia (HCC) Increase Lantus to 17 units at bedtime with a meal aspart 4 units a times a day.  Essential hypertension Continue Imdur. BP elevated. Add prn hydralazine  Acute kidney  injury Suspect secondary to sepsis. Now resolved.  Code Status : Full code  Family Communication  : Friend at bedside  Disposition Plan  : Home tomorrow if continues to improve  Barriers For Discharge : Active symptoms  Consults  :  None  Procedures  : None  DVT Prophylaxis  :  Lovenox -  Lab Results  Component Value Date   PLT 113 (L) 06/13/2016    Antibiotics  :    Anti-infectives  Start     Dose/Rate Route Frequency Ordered Stop   06/12/16 1800  cefTRIAXone (ROCEPHIN) 1 g in dextrose 5 % 50 mL IVPB     1 g 100 mL/hr over 30 Minutes Intravenous Every 24 hours 06/11/16 2322     06/12/16 1800  azithromycin (ZITHROMAX) 500 mg in dextrose 5 % 250 mL IVPB     500 mg 250 mL/hr over 60 Minutes Intravenous Every 24 hours 06/11/16 2322     06/12/16 1000  emtricitabine-tenofovir AF (DESCOVY) 200-25 MG per tablet 1 tablet     1 tablet Oral Daily 06/11/16 2255     06/12/16 1000  dolutegravir (TIVICAY) tablet 50 mg     50 mg Oral Daily 06/11/16 2255     06/11/16 1715  cefTRIAXone (ROCEPHIN) 1 g in dextrose 5 % 50 mL IVPB     1 g 100 mL/hr over 30 Minutes Intravenous  Once 06/11/16 1701 06/11/16 1937   06/11/16 1715  azithromycin (ZITHROMAX) 500 mg in dextrose 5 % 250 mL IVPB     500 mg 250 mL/hr over 60 Minutes Intravenous  Once 06/11/16 1701 06/11/16 2040        Objective:   Vitals:   06/13/16 1930 06/13/16 2008 06/14/16 0645 06/14/16 0944  BP: (!) 152/105 (!) 151/99 114/66 (!) 156/111  Pulse:  100 90 97  Resp:  19 20 18   Temp: 98.4 F (36.9 C) 98.6 F (37 C) 97.9 F (36.6 C) 98.6 F (37 C)  TempSrc: Oral Oral Oral Oral  SpO2:  95% 98% 98%  Weight:      Height:        Wt Readings from Last 3 Encounters:  06/12/16 86.1 kg (189 lb 13.1 oz)  04/25/16 90.9 kg (200 lb 6.4 oz)  02/21/16 93 kg (205 lb)     Intake/Output Summary (Last 24 hours) at 06/14/16 1451 Last data filed at 06/14/16 1341  Gross per 24 hour  Intake          3240.83 ml  Output              2750 ml  Net           490.83 ml     Physical Exam  Gen: not in distress HEENT:moist mucosa, supple neck Chest: Bibasilar crackles CVS: N S1&S2, no murmurs, rubs or gallop GI: soft, NT, ND, BS+ Musculoskeletal: warm, no edema     Data Review:    CBC  Recent Labs Lab 06/11/16 1514 06/12/16 0324 06/13/16 0353  WBC 4.2 4.8 4.0  HGB 16.5 15.6 13.1  HCT 46.5 44.8 39.1  PLT 136* 123* 113*  MCV 95.7 96.8 97.5  MCH 34.0 33.7 32.7  MCHC 35.5 34.8 33.5  RDW 13.2 13.6 13.2  LYMPHSABS 0.9 1.2  --   MONOABS 0.7 0.7  --   EOSABS 0.0 0.0  --   BASOSABS 0.0 0.0  --     Chemistries   Recent Labs Lab 06/11/16 1514 06/11/16 2117 06/12/16 0324 06/13/16 0353 06/14/16 0713  NA 126*  --  130* 133* 132*  K 4.3  --  5.5* 4.1 3.9  CL 89*  --  94* 99* 100*  CO2 21*  --  29 28 23   GLUCOSE 382*  --  345* 217* 234*  BUN 15  --  16 21* 19  CREATININE 1.28*  --  1.41* 0.95 0.93  CALCIUM 9.0  --  8.7* 9.1 8.8*  MG  --  1.7  --   --  1.7  AST  --   --  27 26  --   ALT  --   --  21 20  --   ALKPHOS  --   --  62 76  --   BILITOT  --   --  0.5 0.3  --    ------------------------------------------------------------------------------------------------------------------ No results for input(s): CHOL, HDL, LDLCALC, TRIG, CHOLHDL, LDLDIRECT in the last 72 hours.  Lab Results  Component Value Date   HGBA1C 10.3 (H) 01/17/2016   ------------------------------------------------------------------------------------------------------------------  Recent Labs  06/11/16 2117  TSH 0.791   ------------------------------------------------------------------------------------------------------------------ No results for input(s): VITAMINB12, FOLATE, FERRITIN, TIBC, IRON, RETICCTPCT in the last 72 hours.  Coagulation profile  Recent Labs Lab 06/11/16 2117  INR 1.06     Recent Labs  06/11/16 2117  DDIMER 3.07*    Cardiac Enzymes  Recent Labs Lab 06/11/16 2117 06/12/16 0324  06/12/16 0722  TROPONINI 0.13* 0.11* 0.08*   ------------------------------------------------------------------------------------------------------------------    Component Value Date/Time   BNP 1,688.9 (H) 06/11/2016 1757    Inpatient Medications  Scheduled Meds: . aspirin  81 mg Oral Daily  . azithromycin  500 mg Intravenous Q24H  . cefTRIAXone (ROCEPHIN)  IV  1 g Intravenous Q24H  . dextromethorphan-guaiFENesin  1 tablet Oral BID  . dolutegravir  50 mg Oral Daily  . emtricitabine-tenofovir AF  1 tablet Oral Daily  . enoxaparin (LOVENOX) injection  40 mg Subcutaneous Q24H  . folic acid  1 mg Oral Daily  . hydrALAZINE  100 mg Oral BID  . insulin aspart  0-5 Units Subcutaneous QHS  . insulin aspart  0-9 Units Subcutaneous TID WC  . insulin glargine  5 Units Subcutaneous QHS  . isosorbide mononitrate  60 mg Oral Daily  . LORazepam  0-4 mg Intravenous Q12H  . mouth rinse  15 mL Mouth Rinse BID  . multivitamin with minerals  1 tablet Oral Daily  . thiamine  100 mg Oral Daily   Or  . thiamine  100 mg Intravenous Daily   Continuous Infusions: PRN Meds:.acetaminophen **OR** acetaminophen, guaiFENesin-dextromethorphan, LORazepam **OR** LORazepam, ondansetron **OR** ondansetron (ZOFRAN) IV  Micro Results Recent Results (from the past 240 hour(s))  Blood culture (routine x 2)     Status: None (Preliminary result)   Collection Time: 06/11/16  5:54 PM  Result Value Ref Range Status   Specimen Description BLOOD LEFT ANTECUBITAL  Final   Special Requests BOTTLES DRAWN AEROBIC AND ANAEROBIC 5CC  Final   Culture NO GROWTH 3 DAYS  Final   Report Status PENDING  Incomplete  Blood culture (routine x 2)     Status: None (Preliminary result)   Collection Time: 06/11/16  5:57 PM  Result Value Ref Range Status   Specimen Description BLOOD RIGHT FOREARM  Final   Special Requests AEROBIC BOTTLE ONLY 5CC  Final   Culture NO GROWTH 3 DAYS  Final   Report Status PENDING  Incomplete  Urine  culture     Status: None   Collection Time: 06/11/16  6:00 PM  Result Value Ref Range Status   Specimen Description URINE, CLEAN CATCH  Final   Special Requests NONE  Final   Culture NO GROWTH  Final   Report Status 06/12/2016 FINAL  Final  MRSA PCR Screening     Status: None   Collection Time: 06/12/16 12:21 AM  Result Value Ref Range Status   MRSA by PCR NEGATIVE NEGATIVE Final    Comment:        The GeneXpert MRSA  Assay (FDA approved for NASAL specimens only), is one component of a comprehensive MRSA colonization surveillance program. It is not intended to diagnose MRSA infection nor to guide or monitor treatment for MRSA infections.     Radiology Reports Dg Chest 2 View  Result Date: 06/11/2016 CLINICAL DATA:  Shortness of breath. EXAM: CHEST  2 VIEW COMPARISON:  Radiographs of January 19, 2016. FINDINGS: The heart size and mediastinal contours are within normal limits. No pneumothorax or pleural effusion is noted. Stable small calcified granulomata are noted in both lungs. No acute pulmonary disease is noted. The visualized skeletal structures are unremarkable. IMPRESSION: No active cardiopulmonary disease. Electronically Signed   By: Lupita Raider, M.D.   On: 06/11/2016 15:52   Ct Angio Chest Pe W Or Wo Contrast  Result Date: 06/12/2016 CLINICAL DATA:  Shortness of breath. HIV and congestive heart failure. EXAM: CT ANGIOGRAPHY CHEST WITH CONTRAST TECHNIQUE: Multidetector CT imaging of the chest was performed using the standard protocol during bolus administration of intravenous contrast. Multiplanar CT image reconstructions and MIPs were obtained to evaluate the vascular anatomy. CONTRAST:  200 mL total Isovue 370 COMPARISON:  Chest radiograph 07/28/2016 FINDINGS: Cardiovascular: Contrast injection is sufficient to demonstrate satisfactory opacification of the pulmonary arteries to the segmental level. There is no pulmonary embolus. The main pulmonary artery is within normal  limits for size. There is no CT evidence of acute right heart strain. The visualized aorta is normal. There is a normal 3-vessel arch branching pattern. Heart size is enlarged, without pericardial effusion. There are coronary artery calcifications. Mediastinum/Nodes: No mediastinal, hilar or axillary lymphadenopathy. The visualized thyroid and thoracic esophageal course are unremarkable. Lungs/Pleura: There are scattered calcified granulomata bilaterally. Small area of consolidation noted in the lateral right lung base and medial left lung base. No pleural effusions. Upper Abdomen: Contrast bolus timing is not optimized for evaluation of the abdominal organs. Within this limitation, the visualized organs of the upper abdomen are normal. Musculoskeletal: No chest wall abnormality. No acute or significant osseous findings. Review of the MIP images confirms the above findings. IMPRESSION: 1. No pulmonary embolus. 2. Multiple small foci of consolidation within both lungs, possibly indicating multifocal infection. 3. Cardiomegaly and coronary artery atherosclerotic calcification. 4. Scattered bilateral pulmonary calcified granulomata. Electronically Signed   By: Deatra Robinson M.D.   On: 06/12/2016 00:12   Dg Chest Port 1 View  Result Date: 06/14/2016 CLINICAL DATA:  Shortness of breath EXAM: PORTABLE CHEST 1 VIEW COMPARISON:  Chest x-ray and chest CT scan of June 11, 2016 FINDINGS: The lungs are well-expanded. There is no focal infiltrate. The cardiac silhouette is enlarged. The pulmonary vascularity is mildly engorged and less distinct today. A trace of pleural fluid on the left is suspected. The mediastinum is normal in width. There is a stable calcified approximately 6 mm diameter nodule in the right pulmonary apex. The bony thorax is unremarkable. IMPRESSION: No acute pneumonia. Probable low-grade compensated CHF. When the patient can tolerate the procedure, a PA and lateral chest x-ray would be useful to allow  direct comparison to the earlier study. Electronically Signed   By: David  [place] M.D.   On: 06/14/2016 10:50    Time Spent in minutes  25   Eddie North M.D on 06/14/2016 at 2:51 PM  Between 7am to 7pm - Pager - [phone]  After 7pm go to [email or web address] - password [facility]  [facility] -  Office  [phone]

## 2016-06-14 NOTE — Progress Notes (Signed)
Pt educated via teach back on how to administer insulin, verbalized understanding and was able to self administered insulin.

## 2016-06-15 LAB — LEGIONELLA PNEUMOPHILA SEROGP 1 UR AG: L. pneumophila Serogp 1 Ur Ag: NEGATIVE

## 2016-06-15 LAB — BASIC METABOLIC PANEL
ANION GAP: 8 (ref 5–15)
BUN: 18 mg/dL (ref 6–20)
CALCIUM: 8.9 mg/dL (ref 8.9–10.3)
CO2: 26 mmol/L (ref 22–32)
CREATININE: 0.91 mg/dL (ref 0.61–1.24)
Chloride: 100 mmol/L — ABNORMAL LOW (ref 101–111)
GFR calc Af Amer: >60 mL/min (ref >60)
GFR calc non Af Amer: >60 mL/min (ref >60)
GLUCOSE: 158 mg/dL — AB (ref 65–99)
Potassium: 3.8 mmol/L (ref 3.5–5.1)
Sodium: 134 mmol/L — ABNORMAL LOW (ref 135–145)

## 2016-06-15 LAB — MAGNESIUM: Magnesium: 1.7 mg/dL (ref 1.7–2.4)

## 2016-06-15 LAB — HEMOGLOBIN A1C
HEMOGLOBIN A1C: 10.8 % — AB (ref 4.8–5.6)
Mean Plasma Glucose: 263 mg/dL

## 2016-06-15 LAB — GLUCOSE, CAPILLARY
GLUCOSE-CAPILLARY: 136 mg/dL — AB (ref 65–99)
GLUCOSE-CAPILLARY: 271 mg/dL — AB (ref 65–99)
Glucose-Capillary: 165 mg/dL — ABNORMAL HIGH (ref 65–99)
Glucose-Capillary: 148 mg/dL — ABNORMAL HIGH (ref 65–99)

## 2016-06-15 MED ORDER — GUAIFENESIN-CODEINE 100-10 MG/5ML PO SOLN
10.0000 mL | ORAL | Status: DC | PRN
  Administered 2016-06-15 (×2): 10 mL via ORAL
  Filled 2016-06-15 (×2): qty 10

## 2016-06-15 MED ORDER — INSULIN GLARGINE 100 UNIT/ML ~~LOC~~ SOLN
21.0000 [IU] | Freq: Every day | SUBCUTANEOUS | Status: DC
  Administered 2016-06-15: 21 [IU] via SUBCUTANEOUS
  Filled 2016-06-15 (×2): qty 0.21

## 2016-06-15 MED ORDER — SACUBITRIL-VALSARTAN 49-51 MG PO TABS
1.0000 | ORAL_TABLET | Freq: Two times a day (BID) | ORAL | Status: DC
  Administered 2016-06-15 – 2016-06-16 (×3): 1 via ORAL
  Filled 2016-06-15 (×4): qty 1

## 2016-06-15 MED ORDER — INSULIN ASPART 100 UNIT/ML ~~LOC~~ SOLN
6.0000 [IU] | Freq: Three times a day (TID) | SUBCUTANEOUS | Status: DC
  Administered 2016-06-15 – 2016-06-16 (×5): 6 [IU] via SUBCUTANEOUS

## 2016-06-15 MED ORDER — FUROSEMIDE 10 MG/ML IJ SOLN
40.0000 mg | Freq: Once | INTRAMUSCULAR | Status: AC
  Administered 2016-06-15: 40 mg via INTRAVENOUS
  Filled 2016-06-15: qty 4

## 2016-06-15 MED ORDER — BENZONATATE 100 MG PO CAPS
200.0000 mg | ORAL_CAPSULE | Freq: Three times a day (TID) | ORAL | Status: DC
  Administered 2016-06-15 – 2016-06-16 (×3): 200 mg via ORAL
  Filled 2016-06-15 (×3): qty 2

## 2016-06-15 NOTE — Care Management Note (Signed)
Case Management Note  Patient Details  Name: Shaun Brennan MRN: [id number] Date of Birth: 04/08/1976  Subjective/Objective:   CM following for progression and d/c planning.                  Action/Plan: Met with pt and significant other re d/c plan, [facility] and obtains 042 meds with their program. He has recently been getting his Metformin at the [facility]. This CM rescheduled this pt for a follow up appointment at [facility] for Wednesday, June 21, 2016 @ 10am . This CM has attempted multiple times to reach the pharmacy at [facility] re pt need for insulin, no response. This CM has prepared a MATCH letter for the pt to use in obtaining his insulin.   Expected Discharge Date:  06/15/16               Expected Discharge Plan:  Home/Self Care  In-House Referral:     Discharge planning Services  CM Consult, Medication Assistance, Indigent Health Clinic  Post Acute Care Choice:  NA Choice offered to:  NA  DME Arranged:  N/A DME Agency:  NA  HH Arranged:  NA HH Agency:  NA  Status of Service:  Completed, signed off  If discussed at Long Length of Stay Meetings, dates discussed:    Additional Comments:  ,  U, RN 06/15/2016, 11:53 AM  

## 2016-06-15 NOTE — Progress Notes (Signed)
PROGRESS NOTE                                                                                                                                                                                                             Patient Demographics:    Shaun Brennan, is a 41 y.o. male, DOB - 09/25/1975, NGE:[id number]  Admit date - 06/11/2016   Admitting Physician Eduard Clos, MD  Outpatient Primary MD for the patient is Jaclyn Shaggy, MD  LOS - 4  Outpatient Specialists: ID  Chief Complaint  Patient presents with  . Shortness of Breath       Brief Narrative   41 year old male with HIV, nonischemic cardiomyopathy type 2 diabetes mellitus and hypertension recently to the ED with shortness of breath with fever and chills for past 3-4 days found to be febrile and tachycardic with acute hypoxic respiratory failure secondary to multifocal pneumonia. Required stepdown admission.   Subjective:   Having recurrent cough but his am. Also having some shortness of breath.   Assessment  & Plan :    Principal Problem:   Acute respiratory failure with hypoxia/ Sepsis (HCC) Secondary to multifocal pneumonia. Empiric Rocephin and azithromycin. Cultures negative. Urine strep antigen negative. Legionella antigen pending.Sats Stable When necessary nebs  Active Problems: Nonischemic cardiomyopathy with mild acute systolic  CHF Persistent cough possibly due to mild volume overload. . EF of 40-45% with grade 1 diastolic dysfunction on  last echo. Monitor strict I/O and daily weight. Resumed entresto. The new aspirin, Imdur and hydralazine Will order another dose of IV Lasix.  Ongoing productive cough Antitussive adjusted. Add tessalon pearls.    HIV (human immunodeficiency virus infection) (HCC) CD4 of 410., VL< 20. Continue Tivicay and descovy      Unontrolled type 2 diabetes mellitus with hyperglycemia (HCC) Increase Lantus to  21 units at bedtime with a meal aspart 6 units a times a day. A1c of 10.8. Patient will need insulin with diabetic supplies upon discharge. Insulin teaching provided.  Essential hypertension Continue Imdur. BP elevated. Add prn hydralazine  Acute kidney injury Suspect secondary to sepsis. Now resolved.  Code Status : Full code  Family Communication  : Friend at bedside  Disposition Plan  : Needs monitoring another day for persistent cough and dyspnea. Home tomorrow if continues to improve  Barriers For Discharge :  Active symptoms  Consults  :  None  Procedures  : None  DVT Prophylaxis  :  Lovenox -  Lab Results  Component Value Date   PLT 113 (L) 06/13/2016    Antibiotics  :    Anti-infectives    Start     Dose/Rate Route Frequency Ordered Stop   06/12/16 1800  cefTRIAXone (ROCEPHIN) 1 g in dextrose 5 % 50 mL IVPB     1 g 100 mL/hr over 30 Minutes Intravenous Every 24 hours 06/11/16 2322     06/12/16 1800  azithromycin (ZITHROMAX) 500 mg in dextrose 5 % 250 mL IVPB     500 mg 250 mL/hr over 60 Minutes Intravenous Every 24 hours 06/11/16 2322     06/12/16 1000  emtricitabine-tenofovir AF (DESCOVY) 200-25 MG per tablet 1 tablet     1 tablet Oral Daily 06/11/16 2255     06/12/16 1000  dolutegravir (TIVICAY) tablet 50 mg     50 mg Oral Daily 06/11/16 2255     06/11/16 1715  cefTRIAXone (ROCEPHIN) 1 g in dextrose 5 % 50 mL IVPB     1 g 100 mL/hr over 30 Minutes Intravenous  Once 06/11/16 1701 06/11/16 1937   06/11/16 1715  azithromycin (ZITHROMAX) 500 mg in dextrose 5 % 250 mL IVPB     500 mg 250 mL/hr over 60 Minutes Intravenous  Once 06/11/16 1701 06/11/16 2040        Objective:   Vitals:   06/14/16 2219 06/15/16 0500 06/15/16 0539 06/15/16 1000  BP: (!) 166/113  (!) 151/113 (!) 153/98  Pulse: 97  95 88  Resp: 18  18 18   Temp: 98.2 F (36.8 C)  98.4 F (36.9 C) 98.4 F (36.9 C)  TempSrc: Oral  Oral Oral  SpO2: 98%  98% 100%  Weight: 88.5 kg (195 lb 1.6 oz)  88.5 kg (195 lb 1.7 oz)    Height:        Wt Readings from Last 3 Encounters:  06/15/16 88.5 kg (195 lb 1.7 oz)  04/25/16 90.9 kg (200 lb 6.4 oz)  02/21/16 93 kg (205 lb)     Intake/Output Summary (Last 24 hours) at 06/15/16 1524 Last data filed at 06/15/16 1300  Gross per 24 hour  Intake             1800 ml  Output             2450 ml  Net             -650 ml     Physical Exam  Gen: Some distress with cough HEENT:moist mucosa, supple neck Chest: Bibasilar crackles++ CVS: N S1&S2, no murmurs, rubs or gallop GI: soft, NT, ND,  Musculoskeletal: warm, no edema     Data Review:    CBC  Recent Labs Lab 06/11/16 1514 06/12/16 0324 06/13/16 0353  WBC 4.2 4.8 4.0  HGB 16.5 15.6 13.1  HCT 46.5 44.8 39.1  PLT 136* 123* 113*  MCV 95.7 96.8 97.5  MCH 34.0 33.7 32.7  MCHC 35.5 34.8 33.5  RDW 13.2 13.6 13.2  LYMPHSABS 0.9 1.2  --   MONOABS 0.7 0.7  --   EOSABS 0.0 0.0  --   BASOSABS 0.0 0.0  --     Chemistries   Recent Labs Lab 06/11/16 1514 06/11/16 2117 06/12/16 0324 06/13/16 0353 06/14/16 0713 06/15/16 0559  NA 126*  --  130* 133* 132* 134*  K 4.3  --  5.5* 4.1  3.9 3.8  CL 89*  --  94* 99* 100* 100*  CO2 21*  --  29 28 23 26   GLUCOSE 382*  --  345* 217* 234* 158*  BUN 15  --  16 21* 19 18  CREATININE 1.28*  --  1.41* 0.95 0.93 0.91  CALCIUM 9.0  --  8.7* 9.1 8.8* 8.9  MG  --  1.7  --   --  1.7 1.7  AST  --   --  27 26  --   --   ALT  --   --  21 20  --   --   ALKPHOS  --   --  62 76  --   --   BILITOT  --   --  0.5 0.3  --   --    ------------------------------------------------------------------------------------------------------------------ No results for input(s): CHOL, HDL, LDLCALC, TRIG, CHOLHDL, LDLDIRECT in the last 72 hours.  Lab Results  Component Value Date   HGBA1C 10.8 (H) 06/14/2016   ------------------------------------------------------------------------------------------------------------------ No results for input(s): TSH,  T4TOTAL, T3FREE, THYROIDAB in the last 72 hours.  Invalid input(s): FREET3 ------------------------------------------------------------------------------------------------------------------ No results for input(s): VITAMINB12, FOLATE, FERRITIN, TIBC, IRON, RETICCTPCT in the last 72 hours.  Coagulation profile  Recent Labs Lab 06/11/16 2117  INR 1.06    No results for input(s): DDIMER in the last 72 hours.  Cardiac Enzymes  Recent Labs Lab 06/11/16 2117 06/12/16 0324 06/12/16 0722  TROPONINI 0.13* 0.11* 0.08*   ------------------------------------------------------------------------------------------------------------------    Component Value Date/Time   BNP 1,688.9 (H) 06/11/2016 1757    Inpatient Medications  Scheduled Meds: . aspirin  81 mg Oral Daily  . azithromycin  500 mg Intravenous Q24H  . cefTRIAXone (ROCEPHIN)  IV  1 g Intravenous Q24H  . dextromethorphan-guaiFENesin  1 tablet Oral BID  . dolutegravir  50 mg Oral Daily  . emtricitabine-tenofovir AF  1 tablet Oral Daily  . enoxaparin (LOVENOX) injection  40 mg Subcutaneous Q24H  . folic acid  1 mg Oral Daily  . hydrALAZINE  100 mg Oral BID  . insulin aspart  0-5 Units Subcutaneous QHS  . insulin aspart  0-9 Units Subcutaneous TID WC  . insulin aspart  6 Units Subcutaneous TID WC  . insulin glargine  21 Units Subcutaneous QHS  . isosorbide mononitrate  60 mg Oral Daily  . LORazepam  0-4 mg Intravenous Q12H  . mouth rinse  15 mL Mouth Rinse BID  . multivitamin with minerals  1 tablet Oral Daily  . sacubitril-valsartan  1 tablet Oral BID  . thiamine  100 mg Oral Daily   Continuous Infusions: PRN Meds:.acetaminophen **OR** acetaminophen, guaiFENesin-codeine, ondansetron **OR** ondansetron (ZOFRAN) IV  Micro Results Recent Results (from the past 240 hour(s))  Blood culture (routine x 2)     Status: None (Preliminary result)   Collection Time: 06/11/16  5:54 PM  Result Value Ref Range Status   Specimen  Description BLOOD LEFT ANTECUBITAL  Final   Special Requests BOTTLES DRAWN AEROBIC AND ANAEROBIC 5CC  Final   Culture NO GROWTH 4 DAYS  Final   Report Status PENDING  Incomplete  Blood culture (routine x 2)     Status: None (Preliminary result)   Collection Time: 06/11/16  5:57 PM  Result Value Ref Range Status   Specimen Description BLOOD RIGHT FOREARM  Final   Special Requests AEROBIC BOTTLE ONLY 5CC  Final   Culture NO GROWTH 4 DAYS  Final   Report Status PENDING  Incomplete  Urine culture  Status: None   Collection Time: 06/11/16  6:00 PM  Result Value Ref Range Status   Specimen Description URINE, CLEAN CATCH  Final   Special Requests NONE  Final   Culture NO GROWTH  Final   Report Status 06/12/2016 FINAL  Final  MRSA PCR Screening     Status: None   Collection Time: 06/12/16 12:21 AM  Result Value Ref Range Status   MRSA by PCR NEGATIVE NEGATIVE Final    Comment:        The GeneXpert MRSA Assay (FDA approved for NASAL specimens only), is one component of a comprehensive MRSA colonization surveillance program. It is not intended to diagnose MRSA infection nor to guide or monitor treatment for MRSA infections.     Radiology Reports Dg Chest 2 View  Result Date: 06/11/2016 CLINICAL DATA:  Shortness of breath. EXAM: CHEST  2 VIEW COMPARISON:  Radiographs of January 19, 2016. FINDINGS: The heart size and mediastinal contours are within normal limits. No pneumothorax or pleural effusion is noted. Stable small calcified granulomata are noted in both lungs. No acute pulmonary disease is noted. The visualized skeletal structures are unremarkable. IMPRESSION: No active cardiopulmonary disease. Electronically Signed   By: Lupita Raider, M.D.   On: 06/11/2016 15:52   Ct Angio Chest Pe W Or Wo Contrast  Result Date: 06/12/2016 CLINICAL DATA:  Shortness of breath. HIV and congestive heart failure. EXAM: CT ANGIOGRAPHY CHEST WITH CONTRAST TECHNIQUE: Multidetector CT imaging of the  chest was performed using the standard protocol during bolus administration of intravenous contrast. Multiplanar CT image reconstructions and MIPs were obtained to evaluate the vascular anatomy. CONTRAST:  200 mL total Isovue 370 COMPARISON:  Chest radiograph 07/28/2016 FINDINGS: Cardiovascular: Contrast injection is sufficient to demonstrate satisfactory opacification of the pulmonary arteries to the segmental level. There is no pulmonary embolus. The main pulmonary artery is within normal limits for size. There is no CT evidence of acute right heart strain. The visualized aorta is normal. There is a normal 3-vessel arch branching pattern. Heart size is enlarged, without pericardial effusion. There are coronary artery calcifications. Mediastinum/Nodes: No mediastinal, hilar or axillary lymphadenopathy. The visualized thyroid and thoracic esophageal course are unremarkable. Lungs/Pleura: There are scattered calcified granulomata bilaterally. Small area of consolidation noted in the lateral right lung base and medial left lung base. No pleural effusions. Upper Abdomen: Contrast bolus timing is not optimized for evaluation of the abdominal organs. Within this limitation, the visualized organs of the upper abdomen are normal. Musculoskeletal: No chest wall abnormality. No acute or significant osseous findings. Review of the MIP images confirms the above findings. IMPRESSION: 1. No pulmonary embolus. 2. Multiple small foci of consolidation within both lungs, possibly indicating multifocal infection. 3. Cardiomegaly and coronary artery atherosclerotic calcification. 4. Scattered bilateral pulmonary calcified granulomata. Electronically Signed   By: Deatra Robinson M.D.   On: 06/12/2016 00:12   Dg Chest Port 1 View  Result Date: 06/14/2016 CLINICAL DATA:  Shortness of breath EXAM: PORTABLE CHEST 1 VIEW COMPARISON:  Chest x-ray and chest CT scan of June 11, 2016 FINDINGS: The lungs are well-expanded. There is no focal  infiltrate. The cardiac silhouette is enlarged. The pulmonary vascularity is mildly engorged and less distinct today. A trace of pleural fluid on the left is suspected. The mediastinum is normal in width. There is a stable calcified approximately 6 mm diameter nodule in the right pulmonary apex. The bony thorax is unremarkable. IMPRESSION: No acute pneumonia. Probable low-grade compensated CHF. When the  patient can tolerate the procedure, a PA and lateral chest x-ray would be useful to allow direct comparison to the earlier study. Electronically Signed   By: David  [place] M.D.   On: 06/14/2016 10:50    Time Spent in minutes  25   Eddie North M.D on 06/15/2016 at 3:24 PM  Between 7am to 7pm - Pager - [phone]  After 7pm go to [email or web address] - password [facility]  [facility] -  Office  [phone]

## 2016-06-16 DIAGNOSIS — E0865 Diabetes mellitus due to underlying condition with hyperglycemia: Secondary | ICD-10-CM | POA: Diagnosis present

## 2016-06-16 DIAGNOSIS — N189 Chronic kidney disease, unspecified: Secondary | ICD-10-CM

## 2016-06-16 DIAGNOSIS — Z992 Dependence on renal dialysis: Secondary | ICD-10-CM

## 2016-06-16 DIAGNOSIS — N179 Acute kidney failure, unspecified: Secondary | ICD-10-CM

## 2016-06-16 LAB — CULTURE, BLOOD (ROUTINE X 2)
Culture: NO GROWTH
Culture: NO GROWTH

## 2016-06-16 LAB — BASIC METABOLIC PANEL
ANION GAP: 7 (ref 5–15)
BUN: 14 mg/dL (ref 6–20)
CALCIUM: 8.7 mg/dL — AB (ref 8.9–10.3)
CO2: 25 mmol/L (ref 22–32)
Chloride: 100 mmol/L — ABNORMAL LOW (ref 101–111)
Creatinine, Ser: 0.86 mg/dL (ref 0.61–1.24)
GFR calc Af Amer: >60 mL/min (ref >60)
GLUCOSE: 237 mg/dL — AB (ref 65–99)
POTASSIUM: 3.7 mmol/L (ref 3.5–5.1)
Sodium: 132 mmol/L — ABNORMAL LOW (ref 135–145)

## 2016-06-16 LAB — MAGNESIUM: Magnesium: 1.8 mg/dL (ref 1.7–2.4)

## 2016-06-16 LAB — GLUCOSE, CAPILLARY
GLUCOSE-CAPILLARY: 174 mg/dL — AB (ref 65–99)
Glucose-Capillary: 198 mg/dL — ABNORMAL HIGH (ref 65–99)

## 2016-06-16 MED ORDER — GUAIFENESIN-CODEINE 100-10 MG/5ML PO SOLN: 10.0000 mL | ORAL | 0 refills | Status: DC | PRN

## 2016-06-16 MED ORDER — BLOOD GLUCOSE MONITOR KIT: PACK | 0 refills | Status: DC

## 2016-06-16 MED ORDER — INSULIN NPH ISOPHANE & REGULAR (70-30) 100 UNIT/ML ~~LOC~~ SUSP: 25.0000 [IU] | Freq: Two times a day (BID) | SUBCUTANEOUS | 11 refills | Status: DC

## 2016-06-16 MED ORDER — DEXTROSE 5 % IV SOLN
1.0000 g | INTRAVENOUS | Status: AC
  Administered 2016-06-16: 1 g via INTRAVENOUS
  Filled 2016-06-16: qty 10

## 2016-06-16 MED ORDER — INSULIN NPH ISOPHANE & REGULAR (70-30) 100 UNIT/ML ~~LOC~~ SUSP: 15.0000 [IU] | Freq: Two times a day (BID) | SUBCUTANEOUS | 11 refills | Status: DC

## 2016-06-16 MED ORDER — ATORVASTATIN CALCIUM 20 MG PO TABS: 20.0000 mg | ORAL_TABLET | Freq: Every day | ORAL | 0 refills | Status: DC

## 2016-06-16 MED ORDER — ASPIRIN 81 MG PO TBEC: 81.0000 mg | DELAYED_RELEASE_TABLET | Freq: Every day | ORAL | 3 refills | Status: DC

## 2016-06-16 MED ORDER — METFORMIN HCL 500 MG PO TABS: 1000.0000 mg | ORAL_TABLET | Freq: Two times a day (BID) | ORAL | 0 refills | Status: DC

## 2016-06-16 MED ORDER — DEXTROSE 5 % IV SOLN
500.0000 mg | INTRAVENOUS | Status: AC
  Administered 2016-06-16: 500 mg via INTRAVENOUS
  Filled 2016-06-16: qty 500

## 2016-06-16 MED ORDER — CARVEDILOL 25 MG PO TABS: ORAL_TABLET | ORAL | 3 refills | Status: DC

## 2016-06-16 MED ORDER — "INSULIN SYRINGE 28G X 1/2"" 1 ML MISC": 1.0000 "application " | Freq: Two times a day (BID) | 0 refills | Status: AC

## 2016-06-16 MED FILL — ULTICARE INS SYR 1 ML 30GX1: 30G X 1/2" | 30 days supply | Qty: 50 | Fill #0

## 2016-06-16 MED FILL — metFORMIN HCL 500 MG TABS: 500 | 15 days supply | Qty: 60 | Fill #0

## 2016-06-16 MED FILL — ATORVASTATIN 20 MG TABLET: 20 | 30 days supply | Qty: 30 | Fill #0

## 2016-06-16 MED FILL — NOVOLIN 70/30 100 UNITS/ML: (70-30) 100 | 33 days supply | Qty: 10 | Fill #0

## 2016-06-16 MED FILL — CARVEDILOL 25 MG TABLET: 25 | 30 days supply | Qty: 60 | Fill #0

## 2016-06-16 NOTE — Progress Notes (Signed)
Pt discharged to home. Discharge instructions, medications and follow up appointments discussed and reviewed with pt, verbalized understanding. IV discontinued, cath intact, site clean and dry. Pt was escorted out of the unit in wheelchair accompanied by partner, took all belongings with them.

## 2016-06-16 NOTE — Discharge Instructions (Signed)

## 2016-06-16 NOTE — Clinical Social Work Note (Signed)
Patient medically stable for discharge today and requested a taxi ride get home, as he has several bags with him. Taxi voucher provided to nurse for patient.  Genelle Bal, MSW, LCSW Licensed Clinical Social Worker Clinical Social Work Department [employer] [phone]

## 2016-06-16 NOTE — Discharge Summary (Addendum)
Physician Discharge Summary  Shaun Brennan RSW:[id number] DOB: [birth year] DOA: 06/11/2016  PCP: Arnoldo Morale, MD  Admit date: 06/11/2016 Discharge date: 06/16/2016  Admitted From: Home Disposition:  Home  Recommendations for Outpatient Follow-up:  1. Patient to follow-up at [facility] next week. Patient is being discharged on insulin with supplies. Please adjust dose during outpatient follow-up. 2. Follow-up in the heart failure clinic as scheduled. 3. Follow-up in the ID clinic as scheduled.  Home Health: None Equipment/Devices: None  Discharge Condition: Fair CODE STATUS: Full code Diet recommendation: Heart Healthy / Carb Modified    Discharge Diagnoses:  Principal Problem:   Acute respiratory failure with hypoxia (HCC)   Active Problems:   Sepsis ([place])     Diabetes mellitus due to underlying condition, uncontrolled, with hyperglycemia, without long-term current use of insulin (HCC)   HIV (human immunodeficiency virus infection) ([place])   HTN (hypertension)   Chronic systolic heart failure ([place])   Community acquired pneumonia of right lower lobe of lung ([place])   History of ETOH abuse   Brief narrative/history of present illness Please refer to admission H&P for details, in brief, 41 year old male with HIV, nonischemic cardiomyopathy type 2 diabetes mellitus and hypertension recently to the ED with shortness of breath with fever and chills for past 3-4 days found to be febrile and tachycardic with acute hypoxic respiratory failure secondary to multifocal pneumonia. Required stepdown admission.  Hospital course Principal Problem:   Acute respiratory failure with hypoxia/ Sepsis ([place]) Secondary to multifocal pneumonia. Empiric Rocephin and azithromycin. Cultures Including Urine strep antigen and Legionella antigen Negative. Symptoms improved. Completed 5 day course of antibiotics prior to discharge.  Active Problems: Nonischemic cardiomyopathy with mild acute  systolic  CHF Persistent cough possibly due to mild volume overload. . EF of 40-45% with grade 1 diastolic dysfunction on  last echo. Monitor strict I/O and daily weight. Resumed entresto.  continue aspirin, Coreg Imdur and hydralazine.  Received 2 doses of IV Lasix.  Ongoing productive cough Improved with due to [facility] IV Lasix dose.    HIV (human immunodeficiency virus infection) ([place]) No documented history of AIDS. CD4 of 410., VL< 20. Continue Tivicay and descovy.       Unontrolled type 2 diabetes mellitus with hyperglycemia (HCC) A1c of 10.8. Requiting insulin in the hospital. He will be discharged on insulin with diabetic supplies .Insulin teaching provided. He is going to be discharged on Humulin 70/30, 15 units 2 times daily. Also increase metformin to thousand milligrams twice a day.  Uncontrolled hypertension Appears that patient has not been taking all his blood pressure medications. (Especially carvedilol and hydralazine. I have resumed them all and provided prescriptions. Continue Coreg, hydralazine, Imdur and Lasix (as instructed).    Acute kidney injury Suspect secondary to sepsis. Now resolved.    Family Communication  : Friend at bedside  Disposition Plan  :  home Consults  :  None  Procedures  : None   Discharge Instructions   Allergies as of 06/16/2016      Reactions   Lisinopril Cough      Medication List    STOP taking these medications   guaiFENesin 600 MG 12 hr tablet Commonly known as:  MUCINEX     TAKE these medications   aspirin 81 MG EC tablet Take 1 tablet (81 mg total) by mouth daily.   atorvastatin 20 MG tablet Commonly known as:  LIPITOR Take 1 tablet (20 mg total) by mouth daily.   blood glucose meter  kit and supplies Kit Dispense based on patient and insurance preference. Use up to four times daily as directed. (FOR ICD-9 250.00, 250.01).   carvedilol 25 MG tablet Commonly known as:  COREG TAKE 1 TABLET BY MOUTH 2  TIMES DAILY WITH A MEAL. What changed:  See the new instructions.   DESCOVY 200-25 MG tablet Generic drug:  emtricitabine-tenofovir AF TAKE 1 TABLET BY MOUTH EVERY DAY   furosemide 40 MG tablet Commonly known as:  LASIX Take 1 tablet (40 mg total) by mouth 2 (two) times a week. On Monday and Friday   guaiFENesin-codeine 100-10 MG/5ML syrup Take 10 mLs by mouth every 4 (four) hours as needed for cough.   hydrALAZINE 50 MG tablet Commonly known as:  APRESOLINE Take 2 tablets (100 mg total) by mouth 2 (two) times daily.   insulin NPH-regular Human (70-30) 100 UNIT/ML injection Commonly known as:  NOVOLIN 70/30 Inject 15 Units into the skin 2 (two) times daily with a meal.   INSULIN SYRINGE 1CC/28G 28G X 1/2" 1 ML Misc 1 application by Does not apply route 2 (two) times daily.   isosorbide mononitrate 60 MG 24 hr tablet Commonly known as:  IMDUR Take 1 tablet (60 mg total) by mouth daily.   metFORMIN 500 MG tablet Commonly known as:  GLUCOPHAGE Take 2 tablets (1,000 mg total) by mouth 2 (two) times daily with a meal.   sacubitril-valsartan 49-51 MG Commonly known as:  ENTRESTO Take 1 tablet by mouth 2 (two) times daily.   TIVICAY 50 MG tablet Generic drug:  dolutegravir TAKE 1 TABLET BY MOUTH EVERY DAY      Follow-up [facility] Follow up on 06/21/2016.   Why:  10am, please call if you are unable to keep this appointment.  Contact information: [place] [phone]         Allergies  Allergen Reactions  . Lisinopril Cough      Procedures/Studies: Dg Chest 2 View  Result Date: 06/11/2016 CLINICAL DATA:  Shortness of breath. EXAM: CHEST  2 VIEW COMPARISON:  Radiographs of January 19, 2016. FINDINGS: The heart size and mediastinal contours are within normal limits. No pneumothorax or pleural effusion is noted. Stable small calcified granulomata are noted in both lungs.  No acute pulmonary disease is noted. The visualized skeletal structures are unremarkable. IMPRESSION: No active cardiopulmonary disease. Electronically Signed   By: Marijo Conception, M.D.   On: 06/11/2016 15:52   Ct Angio Chest Pe W Or Wo Contrast  Result Date: 06/12/2016 CLINICAL DATA:  Shortness of breath. HIV and congestive heart failure. EXAM: CT ANGIOGRAPHY CHEST WITH CONTRAST TECHNIQUE: Multidetector CT imaging of the chest was performed using the standard protocol during bolus administration of intravenous contrast. Multiplanar CT image reconstructions and MIPs were obtained to evaluate the vascular anatomy. CONTRAST:  200 mL total Isovue 370 COMPARISON:  Chest radiograph 07/28/2016 FINDINGS: Cardiovascular: Contrast injection is sufficient to demonstrate satisfactory opacification of the pulmonary arteries to the segmental level. There is no pulmonary embolus. The main pulmonary artery is within normal limits for size. There is no CT evidence of acute right heart strain. The visualized aorta is normal. There is a normal 3-vessel arch branching pattern. Heart size is enlarged, without pericardial effusion. There are coronary artery calcifications. Mediastinum/Nodes: No mediastinal, hilar or axillary lymphadenopathy. The visualized thyroid and thoracic esophageal course are unremarkable. Lungs/Pleura: There are scattered calcified granulomata bilaterally. Small area of  consolidation noted in the lateral right lung base and medial left lung base. No pleural effusions. Upper Abdomen: Contrast bolus timing is not optimized for evaluation of the abdominal organs. Within this limitation, the visualized organs of the upper abdomen are normal. Musculoskeletal: No chest wall abnormality. No acute or significant osseous findings. Review of the MIP images confirms the above findings. IMPRESSION: 1. No pulmonary embolus. 2. Multiple small foci of consolidation within both lungs, possibly indicating multifocal  infection. 3. Cardiomegaly and coronary artery atherosclerotic calcification. 4. Scattered bilateral pulmonary calcified granulomata. Electronically Signed   By: Ulyses Jarred M.D.   On: 06/12/2016 00:12   Dg Chest Port 1 View  Result Date: 06/14/2016 CLINICAL DATA:  Shortness of breath EXAM: PORTABLE CHEST 1 VIEW COMPARISON:  Chest x-ray and chest CT scan of June 11, 2016 FINDINGS: The lungs are well-expanded. There is no focal infiltrate. The cardiac silhouette is enlarged. The pulmonary vascularity is mildly engorged and less distinct today. A trace of pleural fluid on the left is suspected. The mediastinum is normal in width. There is a stable calcified approximately 6 mm diameter nodule in the right pulmonary apex. The bony thorax is unremarkable. IMPRESSION: No acute pneumonia. Probable low-grade compensated CHF. When the patient can tolerate the procedure, a PA and lateral chest x-ray would be useful to allow direct comparison to the earlier study. Electronically Signed   By: David  Martinique M.D.   On: 06/14/2016 10:50       Subjective: Dyspnea and cough significantly improved.  Discharge Exam: Vitals:   06/15/16 2110 06/16/16 0416  BP: (!) 127/98 (!) 131/96  Pulse: 97 91  Resp: 17 18  Temp: 98.5 F (36.9 C) 98.6 F (37 C)   Vitals:   06/15/16 1000 06/15/16 1805 06/15/16 2110 06/16/16 0416  BP: (!) 153/98 (!) 155/101 (!) 127/98 (!) 131/96  Pulse: 88 88 97 91  Resp: '18 16 17 18  '$ Temp: 98.4 F (36.9 C) 98 F (36.7 C) 98.5 F (36.9 C) 98.6 F (37 C)  TempSrc: Oral Oral Oral Oral  SpO2: 100% 100% 98% 96%  Weight:   86.9 kg (191 lb 8 oz)   Height:        Gen: Not in distress HEENT:moist mucosa, supple neck Chest: Improved bibasilar crackles CVS: N S1&S2, no murmurs,  GI: soft, NT, ND,  Musculoskeletal: warm, no edema   The results of significant diagnostics from this hospitalization (including imaging, microbiology, ancillary and laboratory) are listed below for  reference.     Microbiology: Recent Results (from the past 240 hour(s))  Blood culture (routine x 2)     Status: None (Preliminary result)   Collection Time: 06/11/16  5:54 PM  Result Value Ref Range Status   Specimen Description BLOOD LEFT ANTECUBITAL  Final   Special Requests BOTTLES DRAWN AEROBIC AND ANAEROBIC 5CC  Final   Culture NO GROWTH 4 DAYS  Final   Report Status PENDING  Incomplete  Blood culture (routine x 2)     Status: None (Preliminary result)   Collection Time: 06/11/16  5:57 PM  Result Value Ref Range Status   Specimen Description BLOOD RIGHT FOREARM  Final   Special Requests AEROBIC BOTTLE ONLY 5CC  Final   Culture NO GROWTH 4 DAYS  Final   Report Status PENDING  Incomplete  Urine culture     Status: None   Collection Time: 06/11/16  6:00 PM  Result Value Ref Range Status   Specimen Description URINE, CLEAN CATCH  Final   Special Requests NONE  Final   Culture NO GROWTH  Final   Report Status 06/12/2016 FINAL  Final  MRSA PCR Screening     Status: None   Collection Time: 06/12/16 12:21 AM  Result Value Ref Range Status   MRSA by PCR NEGATIVE NEGATIVE Final    Comment:        The GeneXpert MRSA Assay (FDA approved for NASAL specimens only), is one component of a comprehensive MRSA colonization surveillance program. It is not intended to diagnose MRSA infection nor to guide or monitor treatment for MRSA infections.      Labs: BNP (last 3 results)  Recent Labs  01/17/16 0849 06/11/16 1757  BNP 66.5 1,610.9*   Basic Metabolic Panel:  Recent Labs Lab 06/11/16 2117 06/12/16 0324 06/13/16 0353 06/14/16 0713 06/15/16 0559 06/16/16 0627  NA  --  130* 133* 132* 134* 132*  K  --  5.5* 4.1 3.9 3.8 3.7  CL  --  94* 99* 100* 100* 100*  CO2  --  '29 28 23 26 25  '$ GLUCOSE  --  345* 217* 234* 158* 237*  BUN  --  16 21* '19 18 14  '$ CREATININE  --  1.41* 0.95 0.93 0.91 0.86  CALCIUM  --  8.7* 9.1 8.8* 8.9 8.7*  MG 1.7  --   --  1.7 1.7 1.8   Liver  Function Tests:  Recent Labs Lab 06/12/16 0324 06/13/16 0353  AST 27 26  ALT 21 20  ALKPHOS 62 76  BILITOT 0.5 0.3  PROT 7.2 5.8*  ALBUMIN 2.9* 2.4*   No results for input(s): LIPASE, AMYLASE in the last 168 hours. No results for input(s): AMMONIA in the last 168 hours. CBC:  Recent Labs Lab 06/11/16 1514 06/12/16 0324 06/13/16 0353  WBC 4.2 4.8 4.0  NEUTROABS 2.7 2.8  --   HGB 16.5 15.6 13.1  HCT 46.5 44.8 39.1  MCV 95.7 96.8 97.5  PLT 136* 123* 113*   Cardiac Enzymes:  Recent Labs Lab 06/11/16 2117 06/12/16 0324 06/12/16 0722  TROPONINI 0.13* 0.11* 0.08*   BNP: Invalid input(s): POCBNP CBG:  Recent Labs Lab 06/15/16 0757 06/15/16 1124 06/15/16 1705 06/15/16 2120 06/16/16 0728  GLUCAP 165* 136* 148* 271* 198*   D-Dimer No results for input(s): DDIMER in the last 72 hours. Hgb A1c  Recent Labs  06/14/16 1540  HGBA1C 10.8*   Lipid Profile No results for input(s): CHOL, HDL, LDLCALC, TRIG, CHOLHDL, LDLDIRECT in the last 72 hours. Thyroid function studies No results for input(s): TSH, T4TOTAL, T3FREE, THYROIDAB in the last 72 hours.  Invalid input(s): FREET3 Anemia work up No results for input(s): VITAMINB12, FOLATE, FERRITIN, TIBC, IRON, RETICCTPCT in the last 72 hours. Urinalysis    Component Value Date/Time   COLORURINE YELLOW 06/11/2016 1800   APPEARANCEUR CLEAR 06/11/2016 1800   LABSPEC 1.027 06/11/2016 1800   PHURINE 6.0 06/11/2016 1800   GLUCOSEU >=500 (A) 06/11/2016 1800   HGBUR MODERATE (A) 06/11/2016 1800   BILIRUBINUR NEGATIVE 06/11/2016 1800   KETONESUR 5 (A) 06/11/2016 1800   PROTEINUR >=300 (A) 06/11/2016 1800   UROBILINOGEN 0.2 12/20/2013 1321   NITRITE NEGATIVE 06/11/2016 1800   LEUKOCYTESUR NEGATIVE 06/11/2016 1800   Sepsis Labs Invalid input(s): PROCALCITONIN,  WBC,  LACTICIDVEN Microbiology Recent Results (from the past 240 hour(s))  Blood culture (routine x 2)     Status: None (Preliminary result)   Collection  Time: 06/11/16  5:54 PM  Result Value Ref Range Status  Specimen Description BLOOD LEFT ANTECUBITAL  Final   Special Requests BOTTLES DRAWN AEROBIC AND ANAEROBIC 5CC  Final   Culture NO GROWTH 4 DAYS  Final   Report Status PENDING  Incomplete  Blood culture (routine x 2)     Status: None (Preliminary result)   Collection Time: 06/11/16  5:57 PM  Result Value Ref Range Status   Specimen Description BLOOD RIGHT FOREARM  Final   Special Requests AEROBIC BOTTLE ONLY 5CC  Final   Culture NO GROWTH 4 DAYS  Final   Report Status PENDING  Incomplete  Urine culture     Status: None   Collection Time: 06/11/16  6:00 PM  Result Value Ref Range Status   Specimen Description URINE, CLEAN CATCH  Final   Special Requests NONE  Final   Culture NO GROWTH  Final   Report Status 06/12/2016 FINAL  Final  MRSA PCR Screening     Status: None   Collection Time: 06/12/16 12:21 AM  Result Value Ref Range Status   MRSA by PCR NEGATIVE NEGATIVE Final    Comment:        The GeneXpert MRSA Assay (FDA approved for NASAL specimens only), is one component of a comprehensive MRSA colonization surveillance program. It is not intended to diagnose MRSA infection nor to guide or monitor treatment for MRSA infections.      Time coordinating discharge: Over 30 minutes  SIGNED:   Louellen Molder, MD  [facility] 06/16/2016, 10:34 AM Pager   If 7PM-7AM, please contact night-coverage [email or web address] Password [email or web address]

## 2016-06-21 ENCOUNTER — Inpatient Hospital Stay: Payer: Self-pay | Admitting: Family Medicine

## 2016-07-24 ENCOUNTER — Encounter (HOSPITAL_COMMUNITY): Payer: Self-pay

## 2016-08-01 ENCOUNTER — Other Ambulatory Visit (HOSPITAL_COMMUNITY): Payer: Self-pay | Admitting: *Deleted

## 2016-08-07 MED FILL — ATORVASTATIN 20 MG TABLET: 20 | 34 days supply | Qty: 34 | Fill #0

## 2016-08-07 MED FILL — FUROSEMIDE 40 MG TABLET: 40 | 30 days supply | Qty: 8 | Fill #0

## 2016-08-07 MED FILL — ISOSORBIDE MN ER 60 MG TAB: 60 | 30 days supply | Qty: 34 | Fill #0

## 2016-08-07 MED FILL — CARVEDILOL 25 MG TABLET: 25 | 34 days supply | Qty: 68 | Fill #0

## 2016-08-07 MED FILL — hydrALAZINE HCL 50 MG TABS: 50 | 34 days supply | Qty: 136 | Fill #0

## 2016-08-08 ENCOUNTER — Telehealth: Payer: Self-pay | Admitting: Licensed Clinical Social Worker

## 2016-08-08 NOTE — Telephone Encounter (Signed)
CSW attempted to contact patient with no success. CSW unable to leave message as mailbox is full. Lasandra Beech, LCSW, CCSW-MCS [phone]

## 2016-08-15 ENCOUNTER — Encounter (HOSPITAL_COMMUNITY): Payer: Self-pay

## 2016-09-18 ENCOUNTER — Encounter: Payer: Self-pay | Admitting: Infectious Diseases

## 2016-09-18 ENCOUNTER — Telehealth: Payer: Self-pay | Admitting: Infectious Diseases

## 2016-09-18 ENCOUNTER — Ambulatory Visit (INDEPENDENT_AMBULATORY_CARE_PROVIDER_SITE_OTHER): Payer: Self-pay | Admitting: Infectious Diseases

## 2016-09-18 VITALS — BP 103/68 | HR 87 | Temp 98.2°F | Ht 65.0 in | Wt 194.0 lb

## 2016-09-18 DIAGNOSIS — Z113 Encounter for screening for infections with a predominantly sexual mode of transmission: Secondary | ICD-10-CM

## 2016-09-18 DIAGNOSIS — B351 Tinea unguium: Secondary | ICD-10-CM | POA: Insufficient documentation

## 2016-09-18 DIAGNOSIS — B20 Human immunodeficiency virus [HIV] disease: Secondary | ICD-10-CM

## 2016-09-18 DIAGNOSIS — Z79899 Other long term (current) drug therapy: Secondary | ICD-10-CM

## 2016-09-18 DIAGNOSIS — E0865 Diabetes mellitus due to underlying condition with hyperglycemia: Secondary | ICD-10-CM

## 2016-09-18 DIAGNOSIS — Z23 Encounter for immunization: Secondary | ICD-10-CM

## 2016-09-18 NOTE — Progress Notes (Signed)
   Subjective:    Patient ID: Shaun Brennan, male    DOB: Oct 15, 1975, 41 y.o.   MRN: [id number]  HPI 41 y.o. male with history of HIV diagnosed approximately 2007, diabetes mellitus, hypertension, history of CHF (30-35% NYHA II). DTGV/Desc is his only rx.  He was in hosptial March 2018 with multifocal pneumonia. His last A1C was 10.8 (06-2016). Sugars have been "fine" Wants to see podiatry.    HIV 1 RNA Quant (copies/mL)  Date Value  05/11/2016 <20 DETECTED (A)  01/17/2016 11,200  09/21/2015 <20   CD4 T Cell Abs (/uL)  Date Value  06/12/2016 410  05/11/2016 340 (L)  01/17/2016 340 (L)    Review of Systems  Constitutional: Negative for appetite change and unexpected weight change.  Respiratory: Negative for cough and shortness of breath.   Cardiovascular: Negative for leg swelling.  Gastrointestinal: Negative for constipation and diarrhea.  Genitourinary: Negative for difficulty urinating.  Skin: Negative for wound.  Neurological: Positive for numbness.  onychomycosis     Objective:   Physical Exam  Constitutional: He appears well-developed and well-nourished.  HENT:  Mouth/Throat: No oropharyngeal exudate.  Eyes: EOM are normal. Pupils are equal, round, and reactive to light.  Neck: Neck supple.  Cardiovascular: Normal rate, regular rhythm and normal heart sounds.   Pulmonary/Chest: Effort normal and breath sounds normal.  Abdominal: Soft. Bowel sounds are normal. There is no tenderness. There is no rebound.  Musculoskeletal: He exhibits edema.  Trace edema RLE.   Lymphadenopathy:    He has no cervical adenopathy.  Neurological: He is alert. He has normal strength.  Grossly normal light touch BLE.   Skin:             Assessment & Plan:

## 2016-09-18 NOTE — Telephone Encounter (Signed)
error 

## 2016-09-18 NOTE — Assessment & Plan Note (Signed)
He will f/u with his PCP Needs optho Will set him up with podiatry.

## 2016-09-18 NOTE — Assessment & Plan Note (Signed)
Offered/refused condoms.  Will see him back in 6 months with labs Consider change to FDC (biktarvy?) Mening vax today Will check his Hep B S Ab post vax at next lab draw

## 2016-09-18 NOTE — Addendum Note (Signed)
Addended by: Andree Coss on: 09/18/2016 04:28 PM   Modules accepted: Orders

## 2016-09-18 NOTE — Assessment & Plan Note (Signed)
Will have him seen by podiatry.   

## 2016-09-27 ENCOUNTER — Encounter (HOSPITAL_COMMUNITY): Payer: Self-pay

## 2016-10-02 MED FILL — CARVEDILOL 25 MG TABLET: 25 | 30 days supply | Qty: 60 | Fill #1

## 2016-10-02 MED FILL — FUROSEMIDE 40 MG TABLET: 40 | 30 days supply | Qty: 10 | Fill #1

## 2016-10-03 ENCOUNTER — Emergency Department (HOSPITAL_COMMUNITY): Payer: Self-pay

## 2016-10-03 ENCOUNTER — Encounter (HOSPITAL_COMMUNITY): Payer: Self-pay

## 2016-10-03 ENCOUNTER — Inpatient Hospital Stay (HOSPITAL_COMMUNITY)
Admission: EM | Admit: 2016-10-03 | Discharge: 2016-10-05 | DRG: 194 | Disposition: A | Discharge status: Home / Self Care | Payer: Self-pay | Attending: Internal Medicine | Admitting: Internal Medicine

## 2016-10-03 ENCOUNTER — Encounter (HOSPITAL_COMMUNITY): Payer: Self-pay | Admitting: Vascular Surgery

## 2016-10-03 DIAGNOSIS — R0602 Shortness of breath: Secondary | ICD-10-CM | POA: Diagnosis present

## 2016-10-03 DIAGNOSIS — R059 Cough, unspecified: Secondary | ICD-10-CM | POA: Diagnosis present

## 2016-10-03 DIAGNOSIS — E119 Type 2 diabetes mellitus without complications: Secondary | ICD-10-CM

## 2016-10-03 DIAGNOSIS — Z9114 Patient's other noncompliance with medication regimen: Secondary | ICD-10-CM

## 2016-10-03 DIAGNOSIS — I10 Essential (primary) hypertension: Secondary | ICD-10-CM | POA: Diagnosis present

## 2016-10-03 DIAGNOSIS — Z8679 Personal history of other diseases of the circulatory system: Secondary | ICD-10-CM

## 2016-10-03 DIAGNOSIS — Z794 Long term (current) use of insulin: Secondary | ICD-10-CM

## 2016-10-03 DIAGNOSIS — R05 Cough: Secondary | ICD-10-CM | POA: Diagnosis present

## 2016-10-03 DIAGNOSIS — I110 Hypertensive heart disease with heart failure: Secondary | ICD-10-CM | POA: Diagnosis present

## 2016-10-03 DIAGNOSIS — J189 Pneumonia, unspecified organism: Principal | ICD-10-CM | POA: Diagnosis present

## 2016-10-03 DIAGNOSIS — E785 Hyperlipidemia, unspecified: Secondary | ICD-10-CM | POA: Diagnosis present

## 2016-10-03 DIAGNOSIS — I5042 Chronic combined systolic (congestive) and diastolic (congestive) heart failure: Secondary | ICD-10-CM | POA: Diagnosis present

## 2016-10-03 DIAGNOSIS — E1165 Type 2 diabetes mellitus with hyperglycemia: Secondary | ICD-10-CM | POA: Diagnosis present

## 2016-10-03 DIAGNOSIS — F17210 Nicotine dependence, cigarettes, uncomplicated: Secondary | ICD-10-CM | POA: Diagnosis present

## 2016-10-03 DIAGNOSIS — Z79899 Other long term (current) drug therapy: Secondary | ICD-10-CM

## 2016-10-03 DIAGNOSIS — Z21 Asymptomatic human immunodeficiency virus [HIV] infection status: Secondary | ICD-10-CM | POA: Diagnosis present

## 2016-10-03 DIAGNOSIS — B20 Human immunodeficiency virus [HIV] disease: Secondary | ICD-10-CM

## 2016-10-03 LAB — BASIC METABOLIC PANEL
Anion gap: 15 (ref 5–15)
BUN: 8 mg/dL (ref 6–20)
CALCIUM: 9.7 mg/dL (ref 8.9–10.3)
CO2: 23 mmol/L (ref 22–32)
CREATININE: 0.95 mg/dL (ref 0.61–1.24)
Chloride: 96 mmol/L — ABNORMAL LOW (ref 101–111)
GFR calc Af Amer: >60 mL/min (ref >60)
Glucose, Bld: 254 mg/dL — ABNORMAL HIGH (ref 65–99)
POTASSIUM: 3.9 mmol/L (ref 3.5–5.1)
SODIUM: 134 mmol/L — AB (ref 135–145)

## 2016-10-03 LAB — URINALYSIS, ROUTINE W REFLEX MICROSCOPIC
Bacteria, UA: NONE SEEN
Bilirubin Urine: NEGATIVE
Ketones, ur: NEGATIVE mg/dL
Leukocytes, UA: NEGATIVE
NITRITE: NEGATIVE
PH: 5 (ref 5.0–8.0)
PROTEIN: 100 mg/dL — AB
Specific Gravity, Urine: 1.03 (ref 1.005–1.030)

## 2016-10-03 LAB — CBC
HCT: 43.6 % (ref 39.0–52.0)
Hemoglobin: 14.9 g/dL (ref 13.0–17.0)
MCH: 33 pg (ref 26.0–34.0)
MCHC: 34.2 g/dL (ref 30.0–36.0)
MCV: 96.5 fL (ref 78.0–100.0)
PLATELETS: 138 10*3/uL — AB (ref 150–400)
RBC: 4.52 MIL/uL (ref 4.22–5.81)
RDW: 12.9 % (ref 11.5–15.5)
WBC: 4.9 10*3/uL (ref 4.0–10.5)

## 2016-10-03 LAB — I-STAT CG4 LACTIC ACID, ED: Lactic Acid, Venous: 2.2 mmol/L (ref 0.5–1.9)

## 2016-10-03 LAB — LACTIC ACID, PLASMA: LACTIC ACID, VENOUS: 1.3 mmol/L (ref 0.5–1.9)

## 2016-10-03 LAB — BRAIN NATRIURETIC PEPTIDE: B Natriuretic Peptide: 93.9 pg/mL (ref 0.0–100.0)

## 2016-10-03 LAB — GLUCOSE, CAPILLARY
GLUCOSE-CAPILLARY: 257 mg/dL — AB (ref 65–99)
Glucose-Capillary: 345 mg/dL — ABNORMAL HIGH (ref 65–99)

## 2016-10-03 LAB — I-STAT TROPONIN, ED: TROPONIN I, POC: 0.01 ng/mL (ref 0.00–0.08)

## 2016-10-03 LAB — STREP PNEUMONIAE URINARY ANTIGEN: Strep Pneumo Urinary Antigen: NEGATIVE

## 2016-10-03 MED ORDER — IOPAMIDOL (ISOVUE-300) INJECTION 61%
INTRAVENOUS | Status: AC
Start: 2016-10-03 — End: 2016-10-03
  Administered 2016-10-03: 75 mL
  Filled 2016-10-03: qty 75

## 2016-10-03 MED ORDER — FUROSEMIDE 40 MG PO TABS
40.0000 mg | ORAL_TABLET | ORAL | Status: DC
  Administered 2016-10-05: 40 mg via ORAL
  Filled 2016-10-03: qty 1

## 2016-10-03 MED ORDER — ASPIRIN 81 MG PO TBEC: 81.0000 mg | DELAYED_RELEASE_TABLET | Freq: Every day | ORAL | Status: DC

## 2016-10-03 MED ORDER — ATORVASTATIN CALCIUM 20 MG PO TABS
20.0000 mg | ORAL_TABLET | Freq: Every day | ORAL | Status: DC
  Administered 2016-10-03 – 2016-10-05 (×3): 20 mg via ORAL
  Filled 2016-10-03 (×3): qty 1

## 2016-10-03 MED ORDER — BENZONATATE 100 MG PO CAPS
200.0000 mg | ORAL_CAPSULE | Freq: Three times a day (TID) | ORAL | Status: DC | PRN
  Administered 2016-10-04 – 2016-10-05 (×2): 200 mg via ORAL
  Filled 2016-10-03 (×2): qty 2

## 2016-10-03 MED ORDER — CARVEDILOL 25 MG PO TABS
25.0000 mg | ORAL_TABLET | Freq: Two times a day (BID) | ORAL | Status: DC
  Administered 2016-10-03 – 2016-10-05 (×4): 25 mg via ORAL
  Filled 2016-10-03 (×4): qty 1

## 2016-10-03 MED ORDER — BENZONATATE 100 MG PO CAPS
100.0000 mg | ORAL_CAPSULE | Freq: Once | ORAL | Status: AC
  Administered 2016-10-03: 100 mg via ORAL
  Filled 2016-10-03: qty 1

## 2016-10-03 MED ORDER — DOLUTEGRAVIR SODIUM 50 MG PO TABS
50.0000 mg | ORAL_TABLET | Freq: Every day | ORAL | Status: DC
  Administered 2016-10-03 – 2016-10-05 (×3): 50 mg via ORAL
  Filled 2016-10-03 (×3): qty 1

## 2016-10-03 MED ORDER — GUAIFENESIN-CODEINE 100-10 MG/5ML PO SOLN
5.0000 mL | Freq: Four times a day (QID) | ORAL | Status: DC | PRN
  Administered 2016-10-03 – 2016-10-05 (×3): 5 mL via ORAL
  Filled 2016-10-03 (×3): qty 5

## 2016-10-03 MED ORDER — INSULIN ASPART 100 UNIT/ML ~~LOC~~ SOLN
0.0000 [IU] | Freq: Three times a day (TID) | SUBCUTANEOUS | Status: DC
  Administered 2016-10-03: 8 [IU] via SUBCUTANEOUS
  Administered 2016-10-04 (×2): 5 [IU] via SUBCUTANEOUS
  Administered 2016-10-04: 8 [IU] via SUBCUTANEOUS
  Administered 2016-10-05: 11 [IU] via SUBCUTANEOUS
  Administered 2016-10-05: 8 [IU] via SUBCUTANEOUS

## 2016-10-03 MED ORDER — DEXTROSE 5 % IV SOLN
1.0000 g | INTRAVENOUS | Status: DC
  Administered 2016-10-04 – 2016-10-05 (×2): 1 g via INTRAVENOUS
  Filled 2016-10-03 (×2): qty 10

## 2016-10-03 MED ORDER — SACUBITRIL-VALSARTAN 49-51 MG PO TABS
1.0000 | ORAL_TABLET | Freq: Two times a day (BID) | ORAL | Status: DC
  Administered 2016-10-03 – 2016-10-05 (×4): 1 via ORAL
  Filled 2016-10-03 (×5): qty 1

## 2016-10-03 MED ORDER — ENOXAPARIN SODIUM 40 MG/0.4ML ~~LOC~~ SOLN
40.0000 mg | SUBCUTANEOUS | Status: DC
  Administered 2016-10-03 – 2016-10-04 (×2): 40 mg via SUBCUTANEOUS
  Filled 2016-10-03 (×2): qty 0.4

## 2016-10-03 MED ORDER — INSULIN ASPART 100 UNIT/ML ~~LOC~~ SOLN
0.0000 [IU] | Freq: Every day | SUBCUTANEOUS | Status: DC
  Administered 2016-10-03: 4 [IU] via SUBCUTANEOUS
  Administered 2016-10-04: 2 [IU] via SUBCUTANEOUS

## 2016-10-03 MED ORDER — DEXTROSE 5 % IV SOLN
1.0000 g | Freq: Once | INTRAVENOUS | Status: AC
  Administered 2016-10-03: 1 g via INTRAVENOUS
  Filled 2016-10-03: qty 10

## 2016-10-03 MED ORDER — ALBUTEROL SULFATE (2.5 MG/3ML) 0.083% IN NEBU: 2.5000 mg | INHALATION_SOLUTION | RESPIRATORY_TRACT | Status: DC | PRN

## 2016-10-03 MED ORDER — HYDRALAZINE HCL 50 MG PO TABS
100.0000 mg | ORAL_TABLET | Freq: Two times a day (BID) | ORAL | Status: DC
  Administered 2016-10-03 – 2016-10-05 (×4): 100 mg via ORAL
  Filled 2016-10-03 (×4): qty 2

## 2016-10-03 MED ORDER — ACETAMINOPHEN 650 MG RE SUPP: 650.0000 mg | Freq: Four times a day (QID) | RECTAL | Status: DC | PRN

## 2016-10-03 MED ORDER — ACETAMINOPHEN 325 MG PO TABS: 650.0000 mg | ORAL_TABLET | Freq: Four times a day (QID) | ORAL | Status: DC | PRN

## 2016-10-03 MED ORDER — DICLOFENAC SODIUM 1 % TD GEL
2.0000 g | Freq: Four times a day (QID) | TRANSDERMAL | Status: DC
  Administered 2016-10-03 – 2016-10-05 (×7): 2 g via TOPICAL
  Filled 2016-10-03: qty 100

## 2016-10-03 MED ORDER — DEXTROSE 5 % IV SOLN
500.0000 mg | INTRAVENOUS | Status: DC
  Administered 2016-10-04: 500 mg via INTRAVENOUS
  Filled 2016-10-03 (×2): qty 500

## 2016-10-03 MED ORDER — EMTRICITABINE-TENOFOVIR AF 200-25 MG PO TABS
1.0000 | ORAL_TABLET | Freq: Every day | ORAL | Status: DC
  Administered 2016-10-03 – 2016-10-05 (×3): 1 via ORAL
  Filled 2016-10-03 (×3): qty 1

## 2016-10-03 MED ORDER — ASPIRIN EC 81 MG PO TBEC
81.0000 mg | DELAYED_RELEASE_TABLET | Freq: Every day | ORAL | Status: DC
  Administered 2016-10-03 – 2016-10-05 (×3): 81 mg via ORAL
  Filled 2016-10-03 (×3): qty 1

## 2016-10-03 MED ORDER — SODIUM CHLORIDE 0.9 % IV BOLUS (SEPSIS)
1000.0000 mL | Freq: Once | INTRAVENOUS | Status: AC
  Administered 2016-10-03: 1000 mL via INTRAVENOUS

## 2016-10-03 MED ORDER — ISOSORBIDE MONONITRATE ER 60 MG PO TB24
60.0000 mg | ORAL_TABLET | Freq: Every day | ORAL | Status: DC
  Administered 2016-10-03 – 2016-10-05 (×3): 60 mg via ORAL
  Filled 2016-10-03 (×3): qty 1

## 2016-10-03 MED ORDER — AZITHROMYCIN 500 MG IV SOLR
500.0000 mg | Freq: Once | INTRAVENOUS | Status: AC
  Administered 2016-10-03: 500 mg via INTRAVENOUS
  Filled 2016-10-03: qty 500

## 2016-10-03 MED ORDER — ACETAMINOPHEN 325 MG PO TABS
650.0000 mg | ORAL_TABLET | Freq: Once | ORAL | Status: AC
  Administered 2016-10-03: 650 mg via ORAL
  Filled 2016-10-03: qty 2

## 2016-10-03 NOTE — ED Notes (Signed)
Admitting at bedside 

## 2016-10-03 NOTE — Progress Notes (Signed)
Shaun Brennan [id number] Admitted to 5W25: 10/03/2016 5:46 PM Attending Provider: Gust Rung, DO    Shaun Brennan is a 41 y.o. male patient admitted from ED awake, alert  & orientated  X 3,  Full Code, VSS - Blood pressure (!) 178/110, pulse 89, temperature 98.9 F (37.2 C), resp. rate 18, height 5\' 5"  (1.651 m), weight 87.7 kg (193 lb 5.5 oz), SpO2 96 %., R/A, no c/o shortness of breath, c/o chest pain with coughing, no distress noted. Non- Tele.   IV site WDL:  hand right, condition patent and no redness with a transparent dsg that's clean dry and intact.  Allergies:   Allergies  Allergen Reactions  . Lisinopril Cough     Past Medical History:  Diagnosis Date  . CHF (congestive heart failure) (HCC)   . Chronic systolic heart failure (HCC)    a. EF 15-20%, grade II DD, LA mild/mod dilated  . Diabetes mellitus without complication (HCC)   . DM2 (diabetes mellitus, type 2) (HCC)   . HIV (human immunodeficiency virus infection) (HCC)   . Hypertension     History:  Will be obtained from the patient.  Pt orientation to unit, room and routine. Information packet given to patient/family and safety video watched.  Admission INP armband ID verified with patient/family, and in place. SR up x 2, fall risk assessment complete with Patient and family verbalizing understanding of risks associated with falls. Pt verbalizes an understanding of how to use the call bell and to call for help before getting out of bed.  Skin, clean-dry- with evidence abrasion to right upper and mid back. .      Will cont to monitor and assist as needed.  Joana Reamer, RN 10/03/2016 5:46 PM

## 2016-10-03 NOTE — ED Notes (Signed)
Gave patient a [place] sandwich with a ginger ale patient is resting with family at beside

## 2016-10-03 NOTE — ED Notes (Signed)
Pt. desat during coughing episodes. Pt. Returns to baseline shortly after. EDP made aware.

## 2016-10-03 NOTE — ED Triage Notes (Signed)
Pt reports to the ED for eval of productive yellow/white cough since Friday and SOB/difficulty catching his breath with coughing episodes. He states that he is SOB at rest as well as with coughing at this time. Also reports some CP with coughing. Denies any N/V/D. Reports some diaphoresis but he is unsure if he has fevers or chills. Denies any blood in this sputum. Reports some night sweats.

## 2016-10-03 NOTE — ED Notes (Signed)
Patient transported to CT 

## 2016-10-03 NOTE — ED Notes (Signed)
Abx delayed due to blood culture collection.

## 2016-10-03 NOTE — ED Notes (Signed)
Pt. Made aware he needs urine specimen.

## 2016-10-03 NOTE — ED Provider Notes (Signed)
[facility] DEPT Provider Note   CSN: [id number] Arrival date & time: 10/03/16  0086     History   Chief Complaint Chief Complaint  Patient presents with  . Cough  . Shortness of Breath    HPI Shaun Brennan is a 41 y.o. male.  Shaun Brennan is a 41 y.o. Male with a history of HIV on HAART, diabetes and CHF who presents to the emergency department complaining of 5 days of cough and chills. Patient reports he developed some shortness of breath starting 2 days ago. He reports some pain in his chest with coughing, but no chest pain otherwise. He reports chills and sweating and possibly subjective fever. He reports he's been compliant with his medications. Last CD4 count was 410 in March 2018. He is followed by infectious disease Dr. Johnnye Sima. He took nothing for treatment of his symptoms today. He denies abdominal pain, nausea, vomiting, lightheadedness, syncope, rashes, or urinary symptoms.   The history is provided by the patient, medical records and a significant other. No language interpreter was used.  Cough  Associated symptoms include chills, rhinorrhea and shortness of breath. Pertinent negatives include no chest pain, no headaches, no sore throat and no wheezing.  Shortness of Breath  Associated symptoms include a fever (subjective ), rhinorrhea and cough. Pertinent negatives include no headaches, no sore throat, no neck pain, no wheezing, no chest pain, no vomiting, no abdominal pain and no rash.    Past Medical History:  Diagnosis Date  . CHF (congestive heart failure) ([place])   . Chronic systolic heart failure (HCC)    a. EF 15-20%, grade II DD, LA mild/mod dilated  . Diabetes mellitus without complication ([place])   . DM2 (diabetes mellitus, type 2) ([place])   . HIV (human immunodeficiency virus infection) ([place])   . Hypertension     Patient Active Problem List   Diagnosis Date Noted  . Pneumonia 10/03/2016  . Onychomycosis 09/18/2016  . Diabetes mellitus due to  underlying condition, uncontrolled, with hyperglycemia, without long-term current use of insulin ([place]) 06/16/2016  . Acute renal failure superimposed on chronic kidney disease, on chronic dialysis ([place])   . Sepsis ([place]) 06/11/2016  . Acute respiratory failure with hypoxia ([place]) 06/11/2016  . Controlled type 2 diabetes mellitus with hyperglycemia ([place]) 06/11/2016  . Cough   . SOB (shortness of breath)   . Shortness of breath 01/17/2016  . Accelerated hypertension   . Hyperlipidemia 11/10/2015  . Diabetic neuropathy ([place]) 11/10/2015  . History of ETOH abuse 09/08/2015  . Suicidal ideation 07/14/2014  . Major depression 07/14/2014  . Depression, major, single episode, moderate ([place]) 07/11/2014  . Community acquired pneumonia 07/07/2014  . Community acquired pneumonia of right lower lobe of lung ([place]) 07/07/2014  . HTN (hypertension) 12/17/2013  . Chronic systolic heart failure ([place]) 12/17/2013  . Diabetes mellitus due to underlying condition without complications ([place]) 76/19/5093  . Smoking 12/12/2013  . Acute on chronic systolic heart failure ([place]) 12/09/2013  . Acute respiratory distress 12/06/2013  . 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 DM2 (diabetes mellitus, type 2) ([place]) 12/06/2013  . HIV (human immunodeficiency virus infection) ([place]) 12/06/2013  . H/O CHF 12/06/2013  . Cardiomegaly: per cxr 12/06/13 12/06/2013    Past Surgical History:  Procedure Laterality Date  . ANKLE SURGERY Right        Home Medications    Prior to Admission medications   Medication Sig Start Date End Date Taking? Authorizing Provider  aspirin 81 MG EC tablet Take 1 tablet (  81 mg total) by mouth daily. 06/16/16   Dhungel, Nishant, MD  atorvastatin (LIPITOR) 20 MG tablet Take 1 tablet (20 mg total) by mouth daily. 06/16/16   Dhungel, Flonnie Overman, MD  blood glucose meter kit and supplies KIT Dispense based on patient and insurance preference. Use up to four times daily as directed. (FOR ICD-9 250.00,  250.01). 06/16/16   Dhungel, Nishant, MD  carvedilol (COREG) 25 MG tablet TAKE 1 TABLET BY MOUTH 2 TIMES DAILY WITH A MEAL. 06/16/16   Dhungel, Nishant, MD  DESCOVY 200-25 MG tablet TAKE 1 TABLET BY MOUTH EVERY DAY 06/01/16   Campbell Riches, MD  furosemide (LASIX) 40 MG tablet Take 1 tablet (40 mg total) by mouth 2 (two) times a week. On Monday and Friday 04/27/16   Darrick Grinder D, NP  hydrALAZINE (APRESOLINE) 50 MG tablet Take 2 tablets (100 mg total) by mouth 2 (two) times daily. 04/25/16   Clegg, [name] D, NP  insulin NPH-regular Human (NOVOLIN 70/30) (70-30) 100 UNIT/ML injection Inject 15 Units into the skin 2 (two) times daily with a meal. 06/16/16   Dhungel, Nishant, MD  isosorbide mononitrate (IMDUR) 60 MG 24 hr tablet Take 1 tablet (60 mg total) by mouth daily. 04/25/16   Clegg, [name] D, NP  metFORMIN (GLUCOPHAGE) 500 MG tablet Take 2 tablets (1,000 mg total) by mouth 2 (two) times daily with a meal. 06/16/16   Dhungel, Nishant, MD  sacubitril-valsartan (ENTRESTO) 49-51 MG Take 1 tablet by mouth 2 (two) times daily. 04/28/16   Bensimhon, Shaune Pascal, MD  TIVICAY 50 MG tablet TAKE 1 TABLET BY MOUTH EVERY DAY 06/01/16   Campbell Riches, MD  Flossie Buffy INSULIN SYRINGE 30G X 1/2" 1 ML MISC  06/16/16   [provider]    Family History Family History  Problem Relation Age of Onset  . Other Father   . Diabetes Mother   . Stroke Paternal Grandfather     Social History Social History  Substance Use Topics  . Smoking status: Current Every Day Smoker    Packs/day: 0.25    Types: Cigarettes  . Smokeless tobacco: Never Used     Comment: about 10 cigarettes a week  . Alcohol use 14.4 oz/week    12 Cans of beer, 12 Shots of liquor per week     Allergies   Lisinopril   Review of Systems Review of Systems  Constitutional: Positive for chills, fatigue and fever (subjective ).  HENT: Positive for congestion and rhinorrhea. Negative for sore throat and trouble swallowing.   Eyes: Negative for  visual disturbance.  Respiratory: Positive for cough and shortness of breath. Negative for chest tightness and wheezing.   Cardiovascular: Negative for chest pain and palpitations.  Gastrointestinal: Negative for abdominal pain, diarrhea, nausea and vomiting.  Genitourinary: Negative for dysuria.  Musculoskeletal: Negative for back pain and neck pain.  Skin: Negative for rash.  Neurological: Negative for light-headedness and headaches.     Physical Exam Updated Vital Signs BP (!) 186/102   Pulse 85   Temp 98.5 F (36.9 C) (Oral)   Resp 20   SpO2 (!) 88%   Physical Exam  Constitutional: He is oriented to person, place, and time. He appears well-developed and well-nourished. No distress.  Chronically ill-appearing. Nontoxic-appearing.  HENT:  Head: Normocephalic and atraumatic.  Mouth/Throat: Oropharynx is clear and moist.  Rhinorrhea present. Mucous membranes slightly dry.  Eyes: Conjunctivae are normal. Pupils are equal, round, and reactive to light. Right eye exhibits no discharge.  Left eye exhibits no discharge.  Neck: Neck supple.  Cardiovascular: Normal rate, regular rhythm, normal heart sounds and intact distal pulses.   Pulmonary/Chest: Effort normal. No stridor. No respiratory distress. He has no wheezes.  Crackles noted to left base. Lung sounds slightly diminished bilaterally. No increased work of breathing. Oxygen saturation is 98% on room air.  Abdominal: Soft. There is no tenderness. There is no guarding.  Musculoskeletal: He exhibits edema. He exhibits no tenderness.  Mild bilateral ankle edema.   Lymphadenopathy:    He has no cervical adenopathy.  Neurological: He is alert and oriented to person, place, and time. Coordination normal.  Skin: Skin is warm and dry. Capillary refill takes less than 2 seconds. No rash noted. He is not diaphoretic. No erythema. No pallor.  Psychiatric: He has a normal mood and affect. His behavior is normal.  Nursing note and vitals  reviewed.    ED Treatments / Results  Labs (all labs ordered are listed, but only abnormal results are displayed) Labs Reviewed  BASIC METABOLIC PANEL - Abnormal; Notable for the following:       Result Value   Sodium 134 (*)    Chloride 96 (*)    Glucose, Bld 254 (*)    All other components within normal limits  CBC - Abnormal; Notable for the following:    Platelets 138 (*)    All other components within normal limits  URINALYSIS, ROUTINE W REFLEX MICROSCOPIC - Abnormal; Notable for the following:    Glucose, UA >=500 (*)    Hgb urine dipstick SMALL (*)    Protein, ur 100 (*)    Squamous Epithelial / LPF 0-5 (*)    All other components within normal limits  I-STAT CG4 LACTIC ACID, ED - Abnormal; Notable for the following:    Lactic Acid, Venous 2.20 (*)    All other components within normal limits  CULTURE, BLOOD (ROUTINE X 2)  CULTURE, BLOOD (ROUTINE X 2)  URINE CULTURE  RESPIRATORY PANEL BY PCR  BRAIN NATRIURETIC PEPTIDE  STREP PNEUMONIAE URINARY ANTIGEN  I-STAT TROPOININ, ED    EKG  EKG Interpretation  Date/Time:  Tuesday October 03 2016 08:43:49 EDT Ventricular Rate:  98 PR Interval:  144 QRS Duration: 122 QT Interval:  390 QTC Calculation: 497 R Axis:   -25 Text Interpretation:  Normal sinus rhythm Left ventricular hypertrophy with QRS widening and repolarization abnormality Cannot rule out Septal infarct , age undetermined Abnormal ECG No significant change since last tracing Confirmed by Wandra Arthurs [phone]) on 10/03/2016 9:26:38 AM       Radiology Dg Chest 2 View  Result Date: 10/03/2016 CLINICAL DATA:  Cough and shortness of breath. EXAM: CHEST  2 VIEW COMPARISON:  06/14/2016 and 06/11/2016 FINDINGS: The heart size and mediastinal contours are within normal limits. Both lungs are clear. The visualized skeletal structures are unremarkable. IMPRESSION: No active cardiopulmonary disease. Electronically Signed   By: Lorriane Shire M.D.   On: 10/03/2016 09:20    Ct Chest W Contrast  Result Date: 10/03/2016 CLINICAL DATA:  Shortness of breath and cough, history of HIV, initial encounter EXAM: CT CHEST WITH CONTRAST TECHNIQUE: Multidetector CT imaging of the chest was performed during intravenous contrast administration. CONTRAST:  50m ISOVUE-300 IOPAMIDOL (ISOVUE-300) INJECTION 61% COMPARISON:  Chest x-ray from earlier in the same day as well as a CT from 06/11/2016, 12/07/2013 FINDINGS: Cardiovascular: Thoracic aorta is within normal limits without aneurysmal dilatation or dissection. The pulmonary artery as visualized is within normal  limits although enhancement is less than optimal. Mild coronary calcifications are seen. The cardiac structures are at the upper limits of normal in size. Mediastinum/Nodes: Thoracic inlet is within normal limits. Stable precarinal lymph node is noted in the AP window. Also noted are stable small hilar lymph nodes and stable subcarinal lymph node. These are likely reactive in nature given their long-term stability since 2015. Lungs/Pleura: Some minimal patchy infiltrative changes noted in the lateral aspect of the left lung base new from the prior exam although other areas of previously seen infiltrate have resolved in the interval consistent with prior inflammatory change. Multiple calcified granulomas are noted. No sizable effusion or pneumothorax is seen. Upper Abdomen: Fatty infiltration liver is noted. The remainder the upper abdomen is within normal limits. Musculoskeletal: No acute bony abnormality is seen. IMPRESSION: Resolution of multiple previous patchy areas of infiltrate particularly in the left lung. This is consistent with postinflammatory change. A minimal amount of left basilar infiltrate is seen also likely related to pulmonary infectious process. Changes of prior granulomatous disease. Stable lymph nodes within the hila and mediastinum dating back to 2015 consistent with benign etiology. Electronically Signed   By:  Inez Catalina M.D.   On: 10/03/2016 11:56    Procedures Procedures (including critical care time)  Medications Ordered in ED Medications  sodium chloride 0.9 % bolus 1,000 mL (0 mLs Intravenous Stopped 10/03/16 1232)  cefTRIAXone (ROCEPHIN) 1 g in dextrose 5 % 50 mL IVPB (0 g Intravenous Stopped 10/03/16 1051)  azithromycin (ZITHROMAX) 500 mg in dextrose 5 % 250 mL IVPB (0 mg Intravenous Stopped 10/03/16 1201)  benzonatate (TESSALON) capsule 100 mg (100 mg Oral Given 10/03/16 1017)  acetaminophen (TYLENOL) tablet 650 mg (650 mg Oral Given 10/03/16 1017)  iopamidol (ISOVUE-300) 61 % injection (75 mLs  Contrast Given 10/03/16 1139)  benzonatate (TESSALON) capsule 100 mg (100 mg Oral Given 10/03/16 1104)     Initial Impression / Assessment and Plan / ED Course  I have reviewed the triage vital signs and the nursing notes.  Pertinent labs & imaging results that were available during my care of the patient were reviewed by me and considered in my medical decision making (see chart for details).     This is a 41 y.o. Male with a history of HIV on HAART, diabetes and CHF who presents to the emergency department complaining of 5 days of cough and chills. Patient reports he developed some shortness of breath starting 2 days ago. He reports some pain in his chest with coughing, but no chest pain otherwise. He reports chills and sweating and possibly subjective fever. He reports he's been compliant with his medications. Last CD4 count was 410 in March 2018. He is followed by infectious disease Dr. Johnnye Sima. He took nothing for treatment of his symptoms today. Patient was admitted to the hospital about 3 months ago with multifocal pneumonia. On exam the patient is afebrile and nontoxic appearing. He is coughing. He is not hypoxic or tachypneic on my exam.  Lung sounds reveal slight crackles noted to his left face and slightly diminished lung sounds. No significant lower extremity edema. Patient does not appear  fluid overloaded. Chest x-ray shows no active cardiopulmonary disease. CBC shows no leukocytosis. Lactic acid is mildly elevated at 2.20. Urinalysis is without signs of infection. Troponin and BNP are not elevated. Patient recently had multifocal pneumonia that was only detected on CT of his chest. We'll obtain CT scan of his chest to rule out atypical pneumonia.  CT chest reveals resolution of multiple previous patchy areas of infiltrate. There is a minimal amount of less basilar infiltrate that is likely related to pulmonary infectious process. Patient is also having some periods of hypoxia when coughing. His oxygen saturation returns to normal and he is not coughing. I called and consulted with infectious disease physician Dr. Baxter Flattery. She agrees with plan to continue Rocephin and azithromycin for antibiotics. Plan for admission by medicine for observation. Patient and family agree with plan. I spoke with internal medicine teaching service who accepted the patient for admission.  This patient was discussed with and evaluated by Dr. Darl Householder who agrees with assessment and plan.   Final Clinical Impressions(s) / ED Diagnoses   Final diagnoses:  Atypical pneumonia  HIV (human immunodeficiency virus infection) ([place])  Cough    New Prescriptions New Prescriptions   No medications on file     Waynetta Pean, Hershal Coria 10/03/16 1529    Drenda Freeze, MD 10/05/16 1256

## 2016-10-03 NOTE — H&P (Signed)
Date: 10/03/2016               Patient Name:  Shaun Brennan MRN: [id number]  DOB: [birth year] Age / Sex: 41 y.o., male   PCP: Arnoldo Morale, MD         Medical Service: Internal Medicine Teaching Service         Attending Physician: Dr. Lucious Groves, DO    First Contact: Dr. Hetty Ely Pager: [phone]  Second Contact: Dr. Juleen China  Pager: [phone]       After Hours (After 5p/  First Contact Pager: [phone]  weekends / holidays): Second Contact Pager: [phone]   Chief Complaint: Productive cough for 1 week  History of Present Illness: Shaun Brennan is a 41 year old man with a past medical history of chronic combined CHF (01/2016 EF 57-84% grade 1 diastolic dysfunction), type 2 diabetes, HIV, hypertension, hyperlipidemia presents with cough productive of white and yellow sputum for the past week. This is associated with pain with coughing, runny nose, wheezing at times and worsening of the cough and difficulty breathing when he lies down He denies diarrhea, sore throat, leg swelling, abdominal pain, fever, chills, sinus pressure, weight gain or changes in appetite. He has no known sick contacts and is up-to-date on his influenza and pneumonia vaccines. He hasn't tried anything over-the-counter medications for this cough but did try taking Lasix. He is usually supposed to take Lasix twice per week but had run out of these pills he picks him up yesterday and told them this did not help to relieve his cough. He has no prior history of MI.   He was admitted in March for multifocal pneumonia is only discovered by CT chest and required treatment with 5 day course Rocephin and azithromycin and had symptom improvement at discharge. Urine strep antigen was positive and legionella was negative at that time.  In the ED he was afebrile with heart rate 69G to 295M systolic pressure 841L, satting at 98% on room air.  Observation revealed a glucose 250, lactic acid 2.2, strep pneumo Urinary antigen negative, poc  troponin 0.01, BNP 94, urinalysis showed greater than 500 glucose with no leukocytes or nitrites. Chest x-ray showed no acute cardiopulmonary disease at CT chest showed a left upper lobe infiltrate. Blood cultures and urine cultures were drawn. A respiratory pathogen panel was sent. He received a dose of ceftriaxone and azithromycin, Tessalon Perles, and Tylenol. These did not help to relieve his cough.  Meds:  Current Meds  Medication Sig  . aspirin 81 MG EC tablet Take 1 tablet (81 mg total) by mouth daily.  Marland Kitchen atorvastatin (LIPITOR) 20 MG tablet Take 1 tablet (20 mg total) by mouth daily.  . blood glucose meter kit and supplies KIT Dispense based on patient and insurance preference. Use up to four times daily as directed. (FOR ICD-9 250.00, 250.01).  . carvedilol (COREG) 25 MG tablet TAKE 1 TABLET BY MOUTH 2 TIMES DAILY WITH A MEAL.  . DESCOVY 200-25 MG tablet TAKE 1 TABLET BY MOUTH EVERY DAY  . furosemide (LASIX) 40 MG tablet Take 1 tablet (40 mg total) by mouth 2 (two) times a week. On Monday and Friday  . hydrALAZINE (APRESOLINE) 50 MG tablet Take 2 tablets (100 mg total) by mouth 2 (two) times daily.  . isosorbide mononitrate (IMDUR) 60 MG 24 hr tablet Take 1 tablet (60 mg total) by mouth daily.  . metFORMIN (GLUCOPHAGE) 500 MG tablet Take 2 tablets (1,000 mg total) by mouth 2 (  two) times daily with a meal.  . sacubitril-valsartan (ENTRESTO) 49-51 MG Take 1 tablet by mouth 2 (two) times daily.  Marland Kitchen TIVICAY 50 MG tablet TAKE 1 TABLET BY MOUTH EVERY DAY  . ULTICARE INSULIN SYRINGE 30G X 1/2" 1 ML MISC      Allergies: Allergies as of 10/03/2016 - Review Complete 09/18/2016  Allergen Reaction Noted  . Lisinopril Cough 03/25/2014   Past Medical History:  Diagnosis Date  . CHF (congestive heart failure) ([place])   . Chronic systolic heart failure (HCC)    a. EF 15-20%, grade II DD, LA mild/mod dilated  . Diabetes mellitus without complication ([place])   . DM2 (diabetes mellitus, type 2) ([place])    . HIV (human immunodeficiency virus infection) ([place])   . Hypertension     Family History: Her mother suffered an MI in his 21s  Social History: Smokes 5 cigarettes per day age 41. Drinks a 12 pack of beer on the weekends. Denies illicit drug use.  Review of Systems: A complete ROS was negative except as per HPI.   Physical Exam: Blood pressure (!) 178/110, pulse 89, temperature 98.9 F (37.2 C), resp. rate 18, height _0  (1.651 m), weight 193 lb 5.5 oz (87.7 kg), SpO2 96 %. Physical Exam  Constitutional: He is oriented to person, place, and time and well-developed, well-nourished, and in no distress. No distress.  HENT:  Head: Normocephalic and atraumatic.  Eyes: Conjunctivae are normal. Right eye exhibits no discharge. Left eye exhibits no discharge. No scleral icterus.  Cardiovascular: Normal rate and regular rhythm.   No murmur heard. 1+ lower extremity pitting edema  Pulmonary/Chest: Effort normal and breath sounds normal. No respiratory distress. He has no wheezes.  Bibasilar crackles, left > right  Actively coughing  Abdominal: Soft. Bowel sounds are normal. He exhibits no distension. There is no tenderness. There is no guarding.  Neurological: He is alert and oriented to person, place, and time.  Skin: Skin is warm and dry. He is not diaphoretic.  Two superficial lacerations over his right upper and mid back, about 1 inch by .5 inch.   Psychiatric: Affect and judgment normal.   EKG: Personal review of the EKG reveals left bundle branch block  CXR: Personal review of the chest x-ray reveals no acute cardiopulmonary disease.  CT chest of the chest shows left lower lobe infiltrate  Assessment & Plan by Problem:    Pneumonia   Shortness of breath   Cough Shaun Brennan is a 41 year old man with a past medical history of HIV who was recently admitted for multifocal pneumonia presents with one week of cough with purulent sputum production. He did describe some symptoms  which are concerning for volume overload but BNP is 94. Chest x-ray did not show a pneumonia however he has pulmonary crackles on exam and CT of the chest did show a left basilar infiltrate. He is afebrile and received a dose of azithromycin and ceftriaxone. ID was phone consulted and agreed with the plan to continue azithromycin and ceftriaxone.  Ordered Tessalon Albuterol nebs when necessary Guifenesin with codeine F/U RPP     DM2 (diabetes mellitus, type 2) ([place]) Last A1c June 2018 was 15. Home medications include metformin 1000 mg twice daily and insulin but he says he does not take insulin. Ordered moderate sliding scale with nighttime correction  Carb modified diet.     HIV (human immunodeficiency virus infection) ([place]) Last viral load 05/11/2016 undetectable and 06/2016 CD4 count 410. He reports good  medication compliance. Continue home meds Tivicay and Descovy     H/O CHF Some signs of volume overload on exam and reports orthopnea, BNP was 94 and chest x-ray did not reveal pulmonary edema. Continue home med carvedilol 25 mg twice daily Continue home meds Lasix 40 mg twice per week Imdur 60 mg daily Entresto BID  Daily weights and strict intakes and outputs    HTN (hypertension) Currently hypertensive, he has not taken his medications today. Ordered home med hydralazine 100 mg twice daily    Hyperlipidemia Continue home meds atorvastatin 20 mg daily   Dispo: Admit patient to Observation with expected length of stay less than 2 midnights.  Signed: Ledell Noss, MD 10/03/2016, 5:53 PM  Pager: [phone]

## 2016-10-04 LAB — RESPIRATORY PANEL BY PCR
ADENOVIRUS-RVPPCR: NOT DETECTED
Bordetella pertussis: NOT DETECTED
CORONAVIRUS HKU1-RVPPCR: NOT DETECTED
CORONAVIRUS NL63-RVPPCR: NOT DETECTED
Chlamydophila pneumoniae: NOT DETECTED
Coronavirus 229E: NOT DETECTED
Coronavirus OC43: NOT DETECTED
Influenza A: NOT DETECTED
Influenza B: NOT DETECTED
METAPNEUMOVIRUS-RVPPCR: NOT DETECTED
Mycoplasma pneumoniae: NOT DETECTED
PARAINFLUENZA VIRUS 1-RVPPCR: NOT DETECTED
PARAINFLUENZA VIRUS 2-RVPPCR: NOT DETECTED
PARAINFLUENZA VIRUS 3-RVPPCR: NOT DETECTED
Parainfluenza Virus 4: NOT DETECTED
RHINOVIRUS / ENTEROVIRUS - RVPPCR: NOT DETECTED
Respiratory Syncytial Virus: NOT DETECTED

## 2016-10-04 LAB — BASIC METABOLIC PANEL
Anion gap: 8 (ref 5–15)
BUN: 11 mg/dL (ref 6–20)
CHLORIDE: 97 mmol/L — AB (ref 101–111)
CO2: 26 mmol/L (ref 22–32)
CREATININE: 0.98 mg/dL (ref 0.61–1.24)
Calcium: 9 mg/dL (ref 8.9–10.3)
GFR calc non Af Amer: >60 mL/min (ref >60)
Glucose, Bld: 278 mg/dL — ABNORMAL HIGH (ref 65–99)
POTASSIUM: 4.1 mmol/L (ref 3.5–5.1)
Sodium: 131 mmol/L — ABNORMAL LOW (ref 135–145)

## 2016-10-04 LAB — URINE CULTURE: Culture: NO GROWTH

## 2016-10-04 LAB — CBC
HEMATOCRIT: 43.2 % (ref 39.0–52.0)
HEMOGLOBIN: 14.8 g/dL (ref 13.0–17.0)
MCH: 33.1 pg (ref 26.0–34.0)
MCHC: 34.3 g/dL (ref 30.0–36.0)
MCV: 96.6 fL (ref 78.0–100.0)
PLATELETS: 111 10*3/uL — AB (ref 150–400)
RBC: 4.47 MIL/uL (ref 4.22–5.81)
RDW: 12.8 % (ref 11.5–15.5)
WBC: 4.1 10*3/uL (ref 4.0–10.5)

## 2016-10-04 LAB — GLUCOSE, CAPILLARY
GLUCOSE-CAPILLARY: 231 mg/dL — AB (ref 65–99)
GLUCOSE-CAPILLARY: 216 mg/dL — AB (ref 65–99)
GLUCOSE-CAPILLARY: 235 mg/dL — AB (ref 65–99)
Glucose-Capillary: 277 mg/dL — ABNORMAL HIGH (ref 65–99)

## 2016-10-04 MED ORDER — INSULIN ASPART PROT & ASPART (70-30 MIX) 100 UNIT/ML ~~LOC~~ SUSP
12.0000 [IU] | Freq: Two times a day (BID) | SUBCUTANEOUS | Status: DC
  Administered 2016-10-04: 12 [IU] via SUBCUTANEOUS
  Filled 2016-10-04 (×2): qty 10

## 2016-10-04 NOTE — Progress Notes (Signed)
Inpatient Diabetes Program Recommendations  AACE/ADA: New Consensus Statement on Inpatient Glycemic Control (2015)  Target Ranges:  Prepandial:   less than 140 mg/dL      Peak postprandial:   less than 180 mg/dL (1-2 hours)      Critically ill patients:  140 - 180 mg/dL   Results for Shaun Brennan, Shaun Brennan (MRN [id number]) as of 10/04/2016 13:35  Ref. Range 10/03/2016 17:46 10/03/2016 21:41 10/04/2016 07:36 10/04/2016 12:59  Glucose-Capillary Latest Ref Range: 65 - 99 mg/dL 545 (H) 625 (H) 638 (H) 231 (H)    Admit with: Productive cough for 1 week  History: DM, CHF, HIV  Home DM Meds: Metformin 1000 mg BID       70/30 Insulin- 15 units BID (patient states he is not taking)  Current Insulin Orders: Novolog Moderate Correction Scale/ SSI (0-15 units) TID AC + HS      MD- Attempted to speak with patient today.  Pt was very sleepy during our conversation and could not keep his eyes open during our time together and kept drifting off while answering questions.  Used to take 70/30 insulin but could not tell me when he stopped it and could not tell me the doses either.  Current A1c level pending.  Please consider restarting 70/30 insulin here in hospital: Recommend starting 70/30 Insulin 12 units BID (0.2 units/kg dosing for basal portion of pre-mixed insulin)     --Will follow patient during hospitalization--  Ambrose Finland RN, MSN, CDE Diabetes Coordinator Inpatient Glycemic Control Team Team Pager: [phone] (8a-5p)

## 2016-10-04 NOTE — Progress Notes (Signed)
   Subjective: Feels that his cough and chest pain with cough have improved but he still feeling weak.   Objective:  Vital signs in last 24 hours: Vitals:   10/04/16 0606 10/04/16 0621 10/04/16 0842 10/04/16 1607  BP: (!) 142/89  (!) 147/86 (!) 169/93  Pulse: 98  98 74  Resp: 18   (!) 24  Temp: 98.4 F (36.9 C)   98.4 F (36.9 C)  TempSrc: Oral     SpO2: 96%   97%  Weight:  193 lb 9.6 oz (87.8 kg)    Height:       Physical Exam  Constitutional: He is oriented to person, place, and time. He appears well-developed and well-nourished. No distress.  HENT:  Head: Normocephalic and atraumatic.  Cardiovascular: Normal rate and regular rhythm.   No murmur heard. Pulmonary/Chest: Effort normal and breath sounds normal. No respiratory distress. He has no wheezes. He has no rales.  Abdominal: Soft. He exhibits no distension. There is no tenderness. There is no guarding.  Neurological: He is alert and oriented to person, place, and time.  Skin: Skin is warm and dry. He is not diaphoretic.  Psychiatric: He has a normal mood and affect. His behavior is normal.   Assessment/Plan:    Pneumonia   Shortness of breath   Cough Cough seems overall improved seems to have responded well to Tessalon and guaifenesin with codeine.  -continue tessalon, albuterol nebs, and guifenesin with codeine PRN  - follow up sputum culture  - follow up urine strep antigen  - continue IV ceftriaxone and azithromycin, will transition to PO cefdinir and azithromycin tomorrow  Follow-up respiratory pathogen panel    DM2 (diabetes mellitus, type 2) (HCC) Last A1c was 15 comments include metformin and he is prescribed insulin but does not use this. - Increased to resistant sliding scale with nighttime coverage Started Novolin 70/30 12 units twice daily    HIV (human immunodeficiency virus infection) (HCC) - continue home med tivicay and descovy     H/O CHF Lungs sound clear and he appears euvolemic on  exam. Continue home meds carvedilol, Lasix, Imdur, and entresto Daily weights and strict Is and Os     HTN (hypertension) Normotensive today - continue home med hydralazine     Hyperlipidemia Continue home med atorvastatin   Dispo: Anticipated discharge tomorrow   Eulah Pont, MD 10/04/2016, 4:22 PM Pager: [phone]

## 2016-10-05 LAB — BASIC METABOLIC PANEL
ANION GAP: 7 (ref 5–15)
BUN: 15 mg/dL (ref 6–20)
CO2: 27 mmol/L (ref 22–32)
Calcium: 9.3 mg/dL (ref 8.9–10.3)
Chloride: 96 mmol/L — ABNORMAL LOW (ref 101–111)
Creatinine, Ser: 1.03 mg/dL (ref 0.61–1.24)
GFR calc Af Amer: >60 mL/min (ref >60)
GFR calc non Af Amer: >60 mL/min (ref >60)
GLUCOSE: 255 mg/dL — AB (ref 65–99)
POTASSIUM: 4.4 mmol/L (ref 3.5–5.1)
Sodium: 130 mmol/L — ABNORMAL LOW (ref 135–145)

## 2016-10-05 LAB — GLUCOSE, CAPILLARY
Glucose-Capillary: 304 mg/dL — ABNORMAL HIGH (ref 65–99)
Glucose-Capillary: 254 mg/dL — ABNORMAL HIGH (ref 65–99)

## 2016-10-05 LAB — HEMOGLOBIN A1C
HEMOGLOBIN A1C: 10.8 % — AB (ref 4.8–5.6)
Mean Plasma Glucose: 263 mg/dL

## 2016-10-05 MED ORDER — CEFDINIR 300 MG PO CAPS: 300.0000 mg | ORAL_CAPSULE | Freq: Two times a day (BID) | ORAL | 0 refills | Status: DC

## 2016-10-05 MED ORDER — INSULIN ASPART PROT & ASPART (70-30 MIX) 100 UNIT/ML ~~LOC~~ SUSP
14.0000 [IU] | Freq: Two times a day (BID) | SUBCUTANEOUS | Status: DC
  Filled 2016-10-05: qty 10

## 2016-10-05 MED ORDER — BENZONATATE 200 MG PO CAPS: 200.0000 mg | ORAL_CAPSULE | Freq: Three times a day (TID) | ORAL | 0 refills | Status: DC | PRN

## 2016-10-05 MED ORDER — AZITHROMYCIN 250 MG PO TABS: 250.0000 mg | ORAL_TABLET | Freq: Every day | ORAL | 0 refills | Status: DC

## 2016-10-05 MED ORDER — AZITHROMYCIN 500 MG PO TABS
250.0000 mg | ORAL_TABLET | Freq: Every day | ORAL | Status: DC
  Administered 2016-10-05: 250 mg via ORAL
  Filled 2016-10-05: qty 1

## 2016-10-05 MED FILL — ?CEFDINIR 300 MG CAPSULE: 300 MG | 2 days supply | Qty: 5 | Fill #0

## 2016-10-05 MED FILL — AZITHROMYCIN 250 MG TABLET: 250 | 2 days supply | Qty: 2 | Fill #0

## 2016-10-05 MED FILL — BENZONATATE 100 MG CAPSULE: 100 | 10 days supply | Qty: 60 | Fill #0

## 2016-10-05 NOTE — Progress Notes (Signed)
   Subjective: Feels as though his cough has improved significantly and his chest pain has resolve, he is ready to return home today.   Objective:  Vital signs in last 24 hours: Vitals:   10/04/16 1607 10/04/16 1630 10/04/16 2129 10/05/16 0728  BP: (!) 169/93 (!) 150/88 132/78 (!) 145/92  Pulse: 74  87 72  Resp: (!) 24  18 20   Temp: 98.4 F (36.9 C)  97.9 F (36.6 C) 98.5 F (36.9 C)  TempSrc:      SpO2: 97%  96% 96%  Weight:    192 lb 4.8 oz (87.2 kg)  Height:       Physical Exam  Constitutional: He is oriented to person, place, and time. He appears well-developed and well-nourished. No distress.  HENT:  Head: Normocephalic and atraumatic.  Cardiovascular: Normal rate and regular rhythm.   No murmur heard. Pulmonary/Chest: Effort normal and breath sounds normal. No respiratory distress. He has no wheezes. He has no rales.  Abdominal: Soft. He exhibits no distension. There is no tenderness. There is no guarding.  Neurological: He is alert and oriented to person, place, and time.  Skin: Skin is warm and dry. He is not diaphoretic.  Psychiatric: He has a normal mood and affect. His behavior is normal.   Assessment/Plan:    Pneumonia   Shortness of breath   Cough He is looking much better today and will return home to complete oral antibiotics. RPP neg.  -continue tessalon, albuterol nebs, and guifenesin with codeine PRN  - follow up sputum culture, not yet collected  -  transition to PO cefdinir and azithromycin today    DM2 (diabetes mellitus, type 2) (HCC)  A1c 10.8 home meds include metformin and he is prescribed insulin but does not use this. - Increased to resistant sliding scale with nighttime coverage Increased Novolin 70/30 14 units twice daily    HIV (human immunodeficiency virus infection) (HCC) - continue home med tivicay and descovy     H/O CHF Lungs sound clear and he appears euvolemic on exam. Continue home meds carvedilol, Lasix, Imdur, and  entresto Daily weights and strict Is and Os     HTN (hypertension) Normotensive today - continue home med hydralazine     Hyperlipidemia Continue home med atorvastatin   Dispo: Anticipated discharge tomorrow   Eulah Pont, MD 10/05/2016, 11:46 AM Pager: [phone]

## 2016-10-05 NOTE — Discharge Summary (Signed)
Name: [name] MRN: [id number] DOB: [birth year] 41 y.o. PCP: Arnoldo Morale, MD  Date of Admission: 10/03/2016  9:09 AM Date of Discharge: 10/05/2016 Attending Physician: Lucious Groves, DO  Discharge Diagnosis: 1. Pneumonia   Cough   Discharge Medications: Allergies as of 10/05/2016      Reactions   Lisinopril Cough      Medication List    TAKE these medications   aspirin 81 MG EC tablet Take 1 tablet (81 mg total) by mouth daily.   atorvastatin 20 MG tablet Commonly known as:  LIPITOR Take 1 tablet (20 mg total) by mouth daily.   azithromycin 250 MG tablet Commonly known as:  ZITHROMAX Take 1 tablet (250 mg total) by mouth daily. Stop taking 6/30   benzonatate 200 MG capsule Commonly known as:  TESSALON Take 1 capsule (200 mg total) by mouth 3 (three) times daily as needed for cough.   blood glucose meter kit and supplies Kit Dispense based on patient and insurance preference. Use up to four times daily as directed. (FOR ICD-9 250.00, 250.01).   carvedilol 25 MG tablet Commonly known as:  COREG TAKE 1 TABLET BY MOUTH 2 TIMES DAILY WITH A MEAL.   cefdinir 300 MG capsule Commonly known as:  OMNICEF Take 1 capsule (300 mg total) by mouth 2 (two) times daily. Stop taking 6/30   DESCOVY 200-25 MG tablet Generic drug:  emtricitabine-tenofovir AF TAKE 1 TABLET BY MOUTH EVERY DAY   furosemide 40 MG tablet Commonly known as:  LASIX Take 1 tablet (40 mg total) by mouth 2 (two) times a week. On Monday and Friday   hydrALAZINE 50 MG tablet Commonly known as:  APRESOLINE Take 2 tablets (100 mg total) by mouth 2 (two) times daily.   insulin NPH-regular Human (70-30) 100 UNIT/ML injection Commonly known as:  NOVOLIN 70/30 Inject 15 Units into the skin 2 (two) times daily with a meal.   isosorbide mononitrate 60 MG 24 hr tablet Commonly known as:  IMDUR Take 1 tablet (60 mg total) by mouth daily.   metFORMIN 500 MG tablet Commonly known as:   GLUCOPHAGE Take 2 tablets (1,000 mg total) by mouth 2 (two) times daily with a meal.   sacubitril-valsartan 49-51 MG Commonly known as:  ENTRESTO Take 1 tablet by mouth 2 (two) times daily.   TIVICAY 50 MG tablet Generic drug:  dolutegravir TAKE 1 TABLET BY MOUTH EVERY DAY   ULTICARE INSULIN SYRINGE 30G X 1/2" 1 ML Misc Generic drug:  Insulin Syringe-Needle U-100       Disposition and follow-up:   ShaunShaun Brennan was discharged from [facility] in Good condition.  At the hospital follow up visit please address:  1.  Diabetes - please reinforce that he should use his insulin  2.  Labs / imaging needed at time of follow-up: BMP  3.  Pending labs/ test needing follow-up: none   Follow-up Appointments: Follow-up Information    [facility]. Call on 10/09/2016.   Why:  Please call at 8:30am to schedule post hospital follow up appointment Contact information: [place] [address] [phone]          Hospital Course by problem list:    Pneumonia 41 yo man with PMH chronic combined CHF, uncontrolled type 2 DM, HIV, hypertension, HLD presented with productive cough associated with runny nose and SOB. In the ED labs showed normal CBC, hyperglycemia, and lactic acid 2.2. Chest xray  did not show an infiltrate. CT chest showed a small left basilar infiltrate. This was discussed with Dr. Baxter Flattery over the phone who recommended treatment for CAP. He was started on IV ceftriazone and azithromycin. On the second day of admission his cough had improved somewhat but he was still feeling weak and asked to stay for an additional day. On the third day of admission he was switched to PO cefdinir and azithromycin and instructed to complete an additional 3 days course.     DM2 (diabetes mellitus, type 2) ([place]) Uncontrolled  Reported non compliance with insulin but does use metformin. CBG was 200-300 during this  admission and did not respond much to 12 units of 70/30 with moderate sliding scale.      H/O CHF He had some signs of Overload on exam but BMP 94 and chest x-ray without pulmonary edema. He reported poor compliance with home Lasix and this was restarted during the admission.    HTN (hypertension) Blood pressure was relatively controlled on home medication hydralazine.     HIV (human immunodeficiency virus infection) ([place])  He reported good compliance with tivicay and descovy.   Discharge Vitals:   BP 102/72 (BP Location: Left Arm)   Pulse 76   Temp 97.6 F (36.4 C)   Resp 18   Ht '5\' 5"'$  (1.651 m)   Wt 192 lb 4.8 oz (87.2 kg)   SpO2 100%   BMI 32.00 kg/m   Pertinent Labs, Studies, and Procedures:   CT chest with contrast 6/26  IMPRESSION: Resolution of multiple previous patchy areas of infiltrate particularly in the left lung. This is consistent with postinflammatory change. A minimal amount of left basilar infiltrate is seen also likely related to pulmonary infectious process.  Changes of prior granulomatous disease.  Stable lymph nodes within the hila and mediastinum dating back to 2015 consistent with benign etiology.  Discharge Instructions: Discharge Instructions    Call MD for:  difficulty breathing, headache or visual disturbances    Complete by:  As directed    Call MD for:  temperature >100.4    Complete by:  As directed    Diet - low sodium heart healthy    Complete by:  As directed    Discharge instructions    Complete by:  As directed    [name],   It has been a pleasure working with you and we are glad you're feeling better.   For your pneumonia,  START taking Cefdinir '300mg'$  twice daily, stop taking this 6/30   START Azithromycin once daily, stop taking this 6/30   Your diabetes is not under control we would like to encourage you to start taking the insulin.  Follow up with your primary care provider in 1-2 weeks  If your symptoms  worsen or you develop new symptoms, please seek medical help whether it is your primary care provider or emergency department.  If you have any questions about this hospitalization please call [phone].   Increase activity slowly    Complete by:  As directed       Signed: Ledell Noss, MD 10/06/2016, 4:23 PM   Pager: [phone]

## 2016-10-05 NOTE — Progress Notes (Signed)
CSW received consult regarding transportation needs. Patient reported having no way to get home and no money for a bus. CSW provided bus pass.  CSW signing off.  Osborne Casco Cortasia Screws LCSWA [phone]

## 2016-10-05 NOTE — Progress Notes (Signed)
Transitions of Care Pharmacy Note  Plan:  Educated on azithromycin and cefdinir  --------------------------------------------- Shaun Brennan is an 41 y.o. male who presents with pneumonia. In anticipation of discharge, pharmacy has reviewed this patient's prior to admission medication history, as well as current inpatient medications listed per the [facility].  Medication indications, dosing, frequency, and notable side effects reviewed with patient. patient verbalized understanding of current inpatient medication regimen and is aware that the After Visit Summary when presented, will represent the most accurate medication list at discharge.   Shaun Brennan expressed concerns regarding payment for antibiotics. He does not have insurance and stated he was unable to afford the medications. Entered consult for case management to see if there are any resources for him available.    Assessment: Understanding of regimen: fair Understanding of indications: fair Potential of compliance: fair Barriers to Obtaining Medications: Yes  Patient instructed to contact inpatient pharmacy team with further questions or concerns if needed.    Time spent preparing for discharge counseling: 10 minutes Time spent counseling patient: 10 minutes    Thank you for allowing pharmacy to be a part of this patient's care.  Carylon Perches, PharmD Acute Care Pharmacy Resident  Pager: [phone] 10/05/2016

## 2016-10-05 NOTE — Care Management Note (Signed)
Case Management Note  Patient Details  Name: Armend Baumhardt MRN: [id number] Date of Birth: 1975/10/10  Subjective/Objective:       Presented with pneumonia.  Action/Plan: Plan is to d/c to home today. CSW to provide pt with bus pass for transportation to home. Pt will pick up filled Rx from the [facility].   Expected Discharge Date:  10/05/16               Expected Discharge Plan:  Home/Self Care  In-House Referral:  Clinical Social Work  Discharge planning Services  CM Consult, Medication Assistance, Indigent Health Clinic  Post Acute Care Choice:    Choice offered to:     DME Arranged:    DME Agency:     HH Arranged:    HH Agency:     Status of Service:  Completed, signed off  If discussed at [employer], dates discussed:    Additional Comments:  Epifanio Lesches, RN 10/05/2016, 2:38 PM

## 2016-10-08 LAB — CULTURE, BLOOD (ROUTINE X 2)
Culture: NO GROWTH
Culture: NO GROWTH
Special Requests: ADEQUATE
Special Requests: ADEQUATE

## 2016-10-09 ENCOUNTER — Ambulatory Visit: Payer: Self-pay

## 2016-12-04 MED FILL — hydrALAZINE HCL 50 MG TABS: 50 | 34 days supply | Qty: 136 | Fill #1

## 2016-12-04 MED FILL — ISOSORBIDE MN ER 60 MG TAB: 60 | 30 days supply | Qty: 34 | Fill #1

## 2016-12-04 MED FILL — ATORVASTATIN 20 MG TABLET: 20 | 34 days supply | Qty: 34 | Fill #1

## 2016-12-04 MED FILL — FUROSEMIDE 40 MG TABLET: 40 | 30 days supply | Qty: 10 | Fill #2

## 2016-12-04 MED FILL — CARVEDILOL 25 MG TABLET: 25 | 34 days supply | Qty: 68 | Fill #1

## 2017-01-06 ENCOUNTER — Emergency Department (HOSPITAL_COMMUNITY)
Admission: EM | Admit: 2017-01-06 | Discharge: 2017-01-08 | Disposition: A | Discharge status: Home / Self Care | Payer: Self-pay | Attending: Emergency Medicine | Admitting: Emergency Medicine

## 2017-01-06 ENCOUNTER — Emergency Department (HOSPITAL_COMMUNITY): Payer: Self-pay

## 2017-01-06 ENCOUNTER — Encounter (HOSPITAL_COMMUNITY): Payer: Self-pay | Admitting: Emergency Medicine

## 2017-01-06 DIAGNOSIS — Z79899 Other long term (current) drug therapy: Secondary | ICD-10-CM | POA: Insufficient documentation

## 2017-01-06 DIAGNOSIS — B20 Human immunodeficiency virus [HIV] disease: Secondary | ICD-10-CM | POA: Insufficient documentation

## 2017-01-06 DIAGNOSIS — R45851 Suicidal ideations: Secondary | ICD-10-CM | POA: Insufficient documentation

## 2017-01-06 DIAGNOSIS — F17210 Nicotine dependence, cigarettes, uncomplicated: Secondary | ICD-10-CM | POA: Insufficient documentation

## 2017-01-06 DIAGNOSIS — I110 Hypertensive heart disease with heart failure: Secondary | ICD-10-CM | POA: Insufficient documentation

## 2017-01-06 DIAGNOSIS — E1121 Type 2 diabetes mellitus with diabetic nephropathy: Secondary | ICD-10-CM | POA: Insufficient documentation

## 2017-01-06 DIAGNOSIS — Z7984 Long term (current) use of oral hypoglycemic drugs: Secondary | ICD-10-CM | POA: Insufficient documentation

## 2017-01-06 DIAGNOSIS — F1014 Alcohol abuse with alcohol-induced mood disorder: Secondary | ICD-10-CM | POA: Insufficient documentation

## 2017-01-06 DIAGNOSIS — I509 Heart failure, unspecified: Secondary | ICD-10-CM | POA: Insufficient documentation

## 2017-01-06 DIAGNOSIS — R05 Cough: Secondary | ICD-10-CM | POA: Insufficient documentation

## 2017-01-06 LAB — SALICYLATE LEVEL

## 2017-01-06 LAB — COMPREHENSIVE METABOLIC PANEL
ALBUMIN: 3.8 g/dL (ref 3.5–5.0)
ALK PHOS: 75 U/L (ref 38–126)
ALT: 42 U/L (ref 17–63)
ANION GAP: 13 (ref 5–15)
AST: 46 U/L — ABNORMAL HIGH (ref 15–41)
BILIRUBIN TOTAL: 0.4 mg/dL (ref 0.3–1.2)
BUN: 10 mg/dL (ref 6–20)
CALCIUM: 9.3 mg/dL (ref 8.9–10.3)
CO2: 25 mmol/L (ref 22–32)
Chloride: 97 mmol/L — ABNORMAL LOW (ref 101–111)
Creatinine, Ser: 0.93 mg/dL (ref 0.61–1.24)
GFR calc non Af Amer: >60 mL/min (ref >60)
Glucose, Bld: 280 mg/dL — ABNORMAL HIGH (ref 65–99)
POTASSIUM: 4 mmol/L (ref 3.5–5.1)
SODIUM: 135 mmol/L (ref 135–145)
TOTAL PROTEIN: 8.3 g/dL — AB (ref 6.5–8.1)

## 2017-01-06 LAB — CBC
HCT: 40.8 % (ref 39.0–52.0)
Hemoglobin: 14.8 g/dL (ref 13.0–17.0)
MCH: 34.7 pg — ABNORMAL HIGH (ref 26.0–34.0)
MCHC: 36.3 g/dL — AB (ref 30.0–36.0)
MCV: 95.8 fL (ref 78.0–100.0)
Platelets: 145 10*3/uL — ABNORMAL LOW (ref 150–400)
RBC: 4.26 MIL/uL (ref 4.22–5.81)
RDW: 12.5 % (ref 11.5–15.5)
WBC: 5.6 10*3/uL (ref 4.0–10.5)

## 2017-01-06 LAB — RAPID URINE DRUG SCREEN, HOSP PERFORMED
Amphetamines: NOT DETECTED
BENZODIAZEPINES: NOT DETECTED
Barbiturates: NOT DETECTED
COCAINE: NOT DETECTED
OPIATES: NOT DETECTED
Tetrahydrocannabinol: NOT DETECTED

## 2017-01-06 LAB — ETHANOL: Alcohol, Ethyl (B): 339 mg/dL (ref <10)

## 2017-01-06 LAB — ACETAMINOPHEN LEVEL

## 2017-01-06 MED ORDER — EMTRICITABINE-TENOFOVIR AF 200-25 MG PO TABS
1.0000 | ORAL_TABLET | Freq: Every day | ORAL | Status: DC
  Administered 2017-01-06 – 2017-01-08 (×3): 1 via ORAL
  Filled 2017-01-06 (×3): qty 1

## 2017-01-06 MED ORDER — HYDRALAZINE HCL 50 MG PO TABS
100.0000 mg | ORAL_TABLET | Freq: Two times a day (BID) | ORAL | Status: DC
  Administered 2017-01-06 – 2017-01-08 (×4): 100 mg via ORAL
  Filled 2017-01-06 (×4): qty 2

## 2017-01-06 MED ORDER — SACUBITRIL-VALSARTAN 49-51 MG PO TABS
1.0000 | ORAL_TABLET | Freq: Two times a day (BID) | ORAL | Status: DC
  Administered 2017-01-06 – 2017-01-08 (×5): 1 via ORAL
  Filled 2017-01-06 (×7): qty 1

## 2017-01-06 MED ORDER — ISOSORBIDE MONONITRATE ER 60 MG PO TB24
60.0000 mg | ORAL_TABLET | Freq: Every day | ORAL | Status: DC
  Administered 2017-01-06 – 2017-01-08 (×3): 60 mg via ORAL
  Filled 2017-01-06 (×3): qty 1

## 2017-01-06 MED ORDER — GABAPENTIN 300 MG PO CAPS
300.0000 mg | ORAL_CAPSULE | Freq: Two times a day (BID) | ORAL | Status: DC
  Administered 2017-01-06 – 2017-01-08 (×5): 300 mg via ORAL
  Filled 2017-01-06 (×5): qty 1

## 2017-01-06 MED ORDER — DOLUTEGRAVIR SODIUM 50 MG PO TABS
50.0000 mg | ORAL_TABLET | Freq: Every day | ORAL | Status: DC
  Administered 2017-01-06 – 2017-01-08 (×3): 50 mg via ORAL
  Filled 2017-01-06 (×3): qty 1

## 2017-01-06 MED ORDER — FUROSEMIDE 40 MG PO TABS
40.0000 mg | ORAL_TABLET | ORAL | Status: DC
  Administered 2017-01-08: 40 mg via ORAL
  Filled 2017-01-06: qty 1

## 2017-01-06 MED ORDER — CARVEDILOL 25 MG PO TABS
25.0000 mg | ORAL_TABLET | Freq: Two times a day (BID) | ORAL | Status: DC
  Administered 2017-01-06 – 2017-01-08 (×4): 25 mg via ORAL
  Filled 2017-01-06 (×4): qty 1

## 2017-01-06 MED ORDER — METFORMIN HCL 500 MG PO TABS
1000.0000 mg | ORAL_TABLET | Freq: Two times a day (BID) | ORAL | Status: DC
  Administered 2017-01-06 – 2017-01-08 (×4): 1000 mg via ORAL
  Filled 2017-01-06 (×4): qty 2

## 2017-01-06 MED ORDER — ASPIRIN EC 81 MG PO TBEC
81.0000 mg | DELAYED_RELEASE_TABLET | Freq: Every day | ORAL | Status: DC
  Administered 2017-01-06 – 2017-01-08 (×3): 81 mg via ORAL
  Filled 2017-01-06 (×3): qty 1

## 2017-01-06 MED ORDER — ATORVASTATIN CALCIUM 20 MG PO TABS
20.0000 mg | ORAL_TABLET | Freq: Every day | ORAL | Status: DC
  Administered 2017-01-06 – 2017-01-07 (×2): 20 mg via ORAL
  Filled 2017-01-06 (×4): qty 1

## 2017-01-06 NOTE — BH Assessment (Addendum)
Assessment Note  Shaun Brennan is an 41 y.o. male that presents this date with thoughts of self harm and intent but denies any plan. Patient is actively impaired at the time of admission BAL 339 and is tearful during assessment. Patient speaks in a low voice and renders limited history. Patient keeps repeating, "I just want to go to asleep forever."  Patient brought in by EMS for ETOH intoxication and S/I. Patient is actively intoxicated and cannot fully participate in the assessment. Patient is observed to be unstable as he attempts to ambulate. Information for purposes of assessment was gathered from history and admission notes. Per notes, patient states his birthday was yesterday and no one loves him and no one came to see him. Patient states people smile to his face and then don't like him. Patient states he "wants to die" but doesn't have a plan. Patient is sobbing uncontrollably and only able to answer a few assessment questions. Patient per note review, has one prior attempt at self harm by overdosing on medications. Patient will not elaborate on that incident this date and just shakes his head "no." Patient states he is currently homeless. Patient does deny H/I shaking his head "no" while sobbing.  No other information could be gathered on this patient at this time. Per note review patient did have a [facility] admission in 2015 for S/I and alcohol issues. Per note review patient has a prior diagnosis of depression. Case was staffed with Akintayo MD who recommended a inpatient admission as appropriate placement is investigated.   Diagnosis: MDD without psychotic features, severe Alcohol abuse  Past Medical History:  Past Medical History:  Diagnosis Date  . CHF (congestive heart failure) (HCC)   . Chronic systolic heart failure (HCC)    a. EF 15-20%, grade II DD, LA mild/mod dilated  . Diabetes mellitus without complication (HCC)   . DM2 (diabetes mellitus, type 2) (HCC)   . HIV (human  immunodeficiency virus infection) (HCC)   . Hypertension     Past Surgical History:  Procedure Laterality Date  . ANKLE SURGERY Right     Family History:  Family History  Problem Relation Age of Onset  . Other Father   . Diabetes Mother   . Stroke Paternal Grandfather     Social History:  reports that he has been smoking Cigarettes.  He has a 3.75 pack-year smoking history. He has never used smokeless tobacco. He reports that he drinks about 9.0 oz of alcohol per week . He reports that he uses drugs, including Marijuana.  Additional Social History:  Alcohol / Drug Use Pain Medications: see med list Prescriptions: see med list Over the Counter: see med list History of alcohol / drug use?: Yes Longest period of sobriety (when/how long): Unknown Negative Consequences of Use: Personal relationships, Financial Withdrawal Symptoms: Agitation, Weakness, Tremors Substance #1 Name of Substance 1: Alcohol 1 - Age of First Use: Unknown 1 - Amount (size/oz): Pt reports different amounts 1 - Frequency: Daily 1 - Duration: Pt states "years" 1 - Last Use / Amount: 01/06/17 Pt is vague in reference to use states "alot"  CIWA: CIWA-Ar BP: (!) 147/72 Pulse Rate: 87 COWS:    Allergies:  Allergies  Allergen Reactions  . Lisinopril Cough    Home Medications:  (Not in a hospital admission)  OB/GYN Status:  No LMP for male patient.  General Assessment Data Location of Assessment: [facility] ED TTS Assessment: In system Is this a Tele or Face-to-Face Assessment?: Face-to-Face Is  this an Initial Assessment or a Re-assessment for this encounter?: Initial Assessment Marital status: Single Maiden name: NA Is patient pregnant?: No Pregnancy Status: No Living Arrangements: Alone Can pt return to current living arrangement?: Yes Admission Status: Voluntary Is patient capable of signing voluntary admission?: Yes Referral Source: Self/Family/Friend Insurance type: Self Pay  Medical Screening  Exam [facility] ONLY) Medical Exam completed: Yes  Crisis Care Plan Living Arrangements: Alone Legal Guardian: Other: (NA) Name of Psychiatrist: None Name of Therapist: None  Education Status Is patient currently in school?: No Current Grade: NA Highest grade of school patient has completed:  (12) Name of school: NA Contact person: NA  Risk to self with the past 6 months Suicidal Ideation: Yes-Currently Present Has patient been a risk to self within the past 6 months prior to admission? : No Suicidal Intent: Yes-Currently Present Has patient had any suicidal intent within the past 6 months prior to admission? : No Is patient at risk for suicide?: Yes Suicidal Plan?: No Has patient had any suicidal plan within the past 6 months prior to admission? : No Access to Means: No What has been your use of drugs/alcohol within the last 12 months?: Current use Previous Attempts/Gestures: Yes How many times?: 1 Other Self Harm Risks: NA Triggers for Past Attempts: Unknown Intentional Self Injurious Behavior: None Family Suicide History: No Recent stressful life event(s): Other (Comment) (Homeless) Persecutory voices/beliefs?: No Depression: Yes Depression Symptoms: Feeling worthless/self pity Substance abuse history and/or treatment for substance abuse?: Yes Suicide prevention information given to non-admitted patients: Not applicable  Risk to Others within the past 6 months Homicidal Ideation: No Does patient have any lifetime risk of violence toward others beyond the six months prior to admission? : No Thoughts of Harm to Others: No Current Homicidal Intent: No Current Homicidal Plan: No Access to Homicidal Means: No Identified Victim: NA History of harm to others?: No Assessment of Violence: None Noted Violent Behavior Description: NA Does patient have access to weapons?: No Criminal Charges Pending?: No Does patient have a court date: No Is patient on probation?:  No  Psychosis Hallucinations: None noted Delusions: None noted  Mental Status Report Appearance/Hygiene: In scrubs Eye Contact: Poor Motor Activity: Unsteady Speech: Slurred, Slow Level of Consciousness: Drowsy Mood: Depressed Affect: Sad, Sullen Anxiety Level: Minimal Thought Processes: Irrelevant Judgement: Impaired Orientation: Place Obsessive Compulsive Thoughts/Behaviors: None  Cognitive Functioning Concentration: Decreased Memory: Recent Impaired IQ: Average Insight: Poor Impulse Control: Poor Appetite:  (UTA) Weight Loss:  (UTA) Weight Gain:  (UTA) Sleep:  (UTA) Total Hours of Sleep:  (UTA) Vegetative Symptoms: None  ADLScreening [facility] Services) Patient's cognitive ability adequate to safely complete daily activities?: Yes Patient able to express need for assistance with ADLs?: Yes Independently performs ADLs?: Yes (appropriate for developmental age)  Prior Inpatient Therapy Prior Inpatient Therapy: Yes Prior Therapy Dates: 2015 Prior Therapy Facilty/Provider(s): [facility] Reason for Treatment: S/I, MH issues  Prior Outpatient Therapy Prior Outpatient Therapy: No Prior Therapy Dates: NA Prior Therapy Facilty/Provider(s): NA Reason for Treatment: NA Does patient have an ACCT team?: No Does patient have Intensive In-House Services?  : No Does patient have [facility] services? : No Does patient have P4CC services?: No  ADL Screening (condition at time of admission) Patient's cognitive ability adequate to safely complete daily activities?: Yes Is the patient deaf or have difficulty hearing?: No Does the patient have difficulty seeing, even when wearing glasses/contacts?: No Does the patient have difficulty concentrating, remembering, or making decisions?: No Patient able  to express need for assistance with ADLs?: Yes Does the patient have difficulty dressing or bathing?: No Independently performs ADLs?: Yes (appropriate for developmental age) Does the  patient have difficulty walking or climbing stairs?: No Weakness of Legs: None Weakness of Arms/Hands: None  Home Assistive Devices/Equipment Home Assistive Devices/Equipment: None  Therapy Consults (therapy consults require a physician order) PT Evaluation Needed: No OT Evalulation Needed: No SLP Evaluation Needed: No Abuse/Neglect Assessment (Assessment to be complete while patient is alone) Physical Abuse: Denies Verbal Abuse: Denies Sexual Abuse: Denies Exploitation of patient/patient's resources: Denies Self-Neglect: Denies Values / Beliefs Cultural Requests During Hospitalization: None Spiritual Requests During Hospitalization: None Consults Spiritual Care Consult Needed: No Social Work Consult Needed: No Merchant navy officer (For Healthcare) Does Patient Have a Medical Advance Directive?: No Would patient like information on creating a medical advance directive?: No - Patient declined    Additional Information 1:1 In Past 12 Months?: No CIRT Risk: No Elopement Risk: No Does patient have medical clearance?: Yes     Disposition: Case was staffed with Akintayo MD who recommended a inpatient admission as appropriate placement is investigated.  Disposition Initial Assessment Completed for this Encounter: Yes Disposition of Patient: Inpatient treatment program Type of inpatient treatment program: Adult  On Site Evaluation by:   Reviewed with Physician:    Alfredia Ferguson 01/06/2017 1:34 PM

## 2017-01-06 NOTE — ED Notes (Signed)
MD at bedside. 

## 2017-01-06 NOTE — ED Provider Notes (Signed)
[facility] DEPT Provider Note   CSN: [id number] Arrival date & time: 01/06/17  0557     History   Chief Complaint Chief Complaint  Patient presents with  . Suicidal  . Cough  . Alcohol Intoxication    HPI Shaun Brennan is a 41 y.o. male.  The history is provided by the patient and medical records. No language interpreter was used.  Cough  This is a new problem. The current episode started more than 1 week ago. The problem occurs constantly. The cough is productive of sputum. There has been no fever. Associated symptoms include sweats. Pertinent negatives include no chest pain, no chills, no weight loss, no headaches, no rhinorrhea, no sore throat, no shortness of breath and no wheezing. He has tried nothing for the symptoms. The treatment provided no relief.    Past Medical History:  Diagnosis Date  . CHF (congestive heart failure) ([place])   . Chronic systolic heart failure (HCC)    a. EF 15-20%, grade II DD, LA mild/mod dilated  . Diabetes mellitus without complication ([place])   . DM2 (diabetes mellitus, type 2) ([place])   . HIV (human immunodeficiency virus infection) ([place])   . Hypertension     Patient Active Problem List   Diagnosis Date Noted  . Pneumonia 10/03/2016  . Onychomycosis 09/18/2016  . Diabetes mellitus due to underlying condition, uncontrolled, with hyperglycemia, without long-term current use of insulin ([place]) 06/16/2016  . Acute renal failure superimposed on chronic kidney disease, on chronic dialysis ([place])   . Sepsis ([place]) 06/11/2016  . Acute respiratory failure with hypoxia ([place]) 06/11/2016  . Controlled type 2 diabetes mellitus with hyperglycemia ([place]) 06/11/2016  . Cough   . SOB (shortness of breath)   . Shortness of breath 01/17/2016  . Accelerated hypertension   . Hyperlipidemia 11/10/2015  . Diabetic neuropathy ([place]) 11/10/2015  . History of ETOH abuse 09/08/2015  . Suicidal ideation 07/14/2014  . Major depression 07/14/2014  . Depression,  major, single episode, moderate ([place]) 07/11/2014  . Community acquired pneumonia 07/07/2014  . Community acquired pneumonia of right lower lobe of lung ([place]) 07/07/2014  . HTN (hypertension) 12/17/2013  . Chronic systolic heart failure ([place]) 12/17/2013  . Diabetes mellitus due to underlying condition without complications ([place]) 01/24/5101  . Smoking 12/12/2013  . Acute on chronic systolic heart failure ([place]) 12/09/2013  . Acute respiratory distress 12/06/2013  . 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 DM2 (diabetes mellitus, type 2) ([place]) 12/06/2013  . HIV (human immunodeficiency virus infection) ([place]) 12/06/2013  . H/O CHF 12/06/2013  . Cardiomegaly: per cxr 12/06/13 12/06/2013    Past Surgical History:  Procedure Laterality Date  . ANKLE SURGERY Right        Home Medications    Prior to Admission medications   Medication Sig Start Date End Date Taking? Authorizing Provider  aspirin 81 MG EC tablet Take 1 tablet (81 mg total) by mouth daily. 06/16/16   Dhungel, Nishant, MD  atorvastatin (LIPITOR) 20 MG tablet Take 1 tablet (20 mg total) by mouth daily. 06/16/16   Dhungel, Nishant, MD  azithromycin (ZITHROMAX) 250 MG tablet Take 1 tablet (250 mg total) by mouth daily. Stop taking 6/30 10/06/16   Ledell Noss, MD  benzonatate (TESSALON) 200 MG capsule Take 1 capsule (200 mg total) by mouth 3 (three) times daily as needed for cough. 10/05/16   Ledell Noss, MD  blood glucose meter kit and supplies KIT Dispense based on patient and insurance preference. Use up to four times daily  as directed. (FOR ICD-9 250.00, 250.01). 06/16/16   Dhungel, Nishant, MD  carvedilol (COREG) 25 MG tablet TAKE 1 TABLET BY MOUTH 2 TIMES DAILY WITH A MEAL. 06/16/16   Dhungel, Nishant, MD  cefdinir (OMNICEF) 300 MG capsule Take 1 capsule (300 mg total) by mouth 2 (two) times daily. Stop taking 6/30 10/05/16   Eulah Pont, MD  DESCOVY 200-25 MG tablet TAKE 1 TABLET BY MOUTH EVERY DAY 06/01/16   Ginnie Smart, MD  furosemide  (LASIX) 40 MG tablet Take 1 tablet (40 mg total) by mouth 2 (two) times a week. On Monday and Friday 04/27/16   Tonye Becket D, NP  hydrALAZINE (APRESOLINE) 50 MG tablet Take 2 tablets (100 mg total) by mouth 2 (two) times daily. 04/25/16   Clegg, [name] D, NP  insulin NPH-regular Human (NOVOLIN 70/30) (70-30) 100 UNIT/ML injection Inject 15 Units into the skin 2 (two) times daily with a meal. Patient not taking: Reported on 10/03/2016 06/16/16   Dhungel, Theda Belfast, MD  isosorbide mononitrate (IMDUR) 60 MG 24 hr tablet Take 1 tablet (60 mg total) by mouth daily. 04/25/16   Clegg, [name] D, NP  metFORMIN (GLUCOPHAGE) 500 MG tablet Take 2 tablets (1,000 mg total) by mouth 2 (two) times daily with a meal. 06/16/16   Dhungel, Nishant, MD  sacubitril-valsartan (ENTRESTO) 49-51 MG Take 1 tablet by mouth 2 (two) times daily. 04/28/16   Bensimhon, Bevelyn Buckles, MD  TIVICAY 50 MG tablet TAKE 1 TABLET BY MOUTH EVERY DAY 06/01/16   Ginnie Smart, MD  Stann Ore INSULIN SYRINGE 30G X 1/2" 1 ML MISC  06/16/16   [provider]    Family History Family History  Problem Relation Age of Onset  . Other Father   . Diabetes Mother   . Stroke Paternal Grandfather     Social History Social History  Substance Use Topics  . Smoking status: Current Every Day Smoker    Packs/day: 0.25    Years: 15.00    Types: Cigarettes  . Smokeless tobacco: Never Used     Comment: about 10 cigarettes a week  . Alcohol use 9.0 oz/week    12 Cans of beer, 3 Shots of liquor per week     Allergies   Lisinopril   Review of Systems Review of Systems  Constitutional: Negative for chills, diaphoresis, fatigue, fever and weight loss.  HENT: Negative for congestion, rhinorrhea and sore throat.   Eyes: Negative for visual disturbance.  Respiratory: Positive for cough. Negative for chest tightness, shortness of breath and wheezing.   Cardiovascular: Negative for chest pain.  Gastrointestinal: Negative for abdominal pain, diarrhea, nausea  and vomiting.  Genitourinary: Negative for dysuria, flank pain and frequency.  Musculoskeletal: Negative for back pain, neck pain and neck stiffness.  Skin: Negative for rash and wound.  Neurological: Negative for weakness, light-headedness, numbness and headaches.  Psychiatric/Behavioral: Positive for suicidal ideas.  All other systems reviewed and are negative.    Physical Exam Updated Vital Signs BP (!) 134/96   Pulse 85   Temp 98 F (36.7 C) (Oral)   Resp (!) 21   SpO2 95%   Physical Exam  Constitutional: He is oriented to person, place, and time. He appears well-developed and well-nourished. No distress.  HENT:  Head: Normocephalic and atraumatic.  Mouth/Throat: Oropharynx is clear and moist. No oropharyngeal exudate.  Eyes: Pupils are equal, round, and reactive to light. Conjunctivae and EOM are normal.  Neck: Normal range of motion. Neck supple.  Cardiovascular: Normal  rate and regular rhythm.   No murmur heard. Pulmonary/Chest: Effort normal and breath sounds normal. No stridor. No respiratory distress. He has no wheezes. He exhibits no tenderness.  Abdominal: Soft. There is no tenderness.  Musculoskeletal: He exhibits no edema or tenderness.  Neurological: He is alert and oriented to person, place, and time. No sensory deficit. He exhibits normal muscle tone.  Skin: Skin is warm and dry. Capillary refill takes less than 2 seconds. He is not diaphoretic. No erythema. No pallor.  Psychiatric: He is not actively hallucinating. He exhibits a depressed mood. He expresses suicidal ideation. He expresses no homicidal ideation. He expresses suicidal plans. He expresses no homicidal plans.  Nursing note and vitals reviewed.    ED Treatments / Results  Labs (all labs ordered are listed, but only abnormal results are displayed) Labs Reviewed  COMPREHENSIVE METABOLIC PANEL - Abnormal; Notable for the following:       Result Value   Chloride 97 (*)    Glucose, Bld 280 (*)     Total Protein 8.3 (*)    AST 46 (*)    All other components within normal limits  ETHANOL - Abnormal; Notable for the following:    Alcohol, Ethyl (B) 339 (*)    All other components within normal limits  ACETAMINOPHEN LEVEL - Abnormal; Notable for the following:    Acetaminophen (Tylenol), Serum <10 (*)    All other components within normal limits  CBC - Abnormal; Notable for the following:    MCH 34.7 (*)    MCHC 36.3 (*)    Platelets 145 (*)    All other components within normal limits  SALICYLATE LEVEL  RAPID URINE DRUG SCREEN, HOSP PERFORMED    EKG  EKG Interpretation None       Radiology Dg Chest 2 View  Result Date: 01/06/2017 CLINICAL DATA:  Chest pain and cough for 3 weeks. EXAM: CHEST  2 VIEW COMPARISON:  10/03/2016 chest radiograph and CT, and prior studies FINDINGS: Mild cardiomegaly noted. Mild peribronchial thickening and scattered calcified granulomas are unchanged. There is no evidence of focal airspace disease, pulmonary edema, suspicious pulmonary nodule/mass, pleural effusion, or pneumothorax. No acute bony abnormalities are identified. IMPRESSION: Mild cardiomegaly without evidence of acute cardiopulmonary disease. Electronically Signed   By: Margarette Canada M.D.   On: 01/06/2017 08:13    Procedures Procedures (including critical care time)  Medications Ordered in ED Medications  gabapentin (NEURONTIN) capsule 300 mg (300 mg Oral Given 01/06/17 1413)  atorvastatin (LIPITOR) tablet 20 mg (not administered)  aspirin EC tablet 81 mg (not administered)  carvedilol (COREG) tablet 25 mg (not administered)  emtricitabine-tenofovir AF (DESCOVY) 200-25 MG per tablet 1 tablet (not administered)  furosemide (LASIX) tablet 40 mg (not administered)  hydrALAZINE (APRESOLINE) tablet 100 mg (not administered)  isosorbide mononitrate (IMDUR) 24 hr tablet 60 mg (not administered)  metFORMIN (GLUCOPHAGE) tablet 1,000 mg (not administered)  sacubitril-valsartan (ENTRESTO)  49-51 mg per tablet (not administered)  dolutegravir (TIVICAY) tablet 50 mg (not administered)     Initial Impression / Assessment and Plan / ED Course  I have reviewed the triage vital signs and the nursing notes.  Pertinent labs & imaging results that were available during my care of the patient were reviewed by me and considered in my medical decision making (see chart for details).     Shaun Brennan is a 41 y.o. male with a past medical history significant for HIV, diabetes, hypertension, and CHF who presents with alcohol intoxication, suicidal  ideation, and cough. Patient reports that for the last 3 weeks he has had a productive cough of yellow sputum. He says that he's been taking all his medications and HIV medications as prescribed. He is primarily here because he is having severe depression and suicidal thoughts. He reports that he has had suicidal thoughts in the past and has had a prior overdose attempt years ago. He says he is getting that point and "feeling like that again". He says that "the man I love does not love me anymore". He ays that he would not cut himself, hanging himself, or jump out of the building but he said he would likely overdose again. He denies any AVH or homicidal ideation. He is very tearful during history gathering.   On exam, lungs are clear. No rhonchi were appreciated. Chest is nontender. Abdomen is nontender.  Patient will have workup including labs and x-ray to look for pneumonia. He will have screening behavior health laboratory testing performed.  Given the patient's suicidal ideation with the plan and history of attempts, patient will need psychiatric evaluation. Patient is voluntary at this time however, if he decides to leave, he would likely need to be placed under involuntary commitment due to SI with a plan and hx of prior attempt.   9:38 AM Patient's laboratory testing revealed intoxication with alcohol 339.  Other laboratory testing was grossly  reassuring aside from elevated glucose. No leukocytosis. No evidence of pneumonia on chest x-ray. EDS negative.  As there is no evidence of pneumonia, patient is medically cleared for psychiatric recommendations.  Home medications were ordered after review by the pharmacy team. Anticipate following psychiatric recommendations.    Final Clinical Impressions(s) / ED Diagnoses   Final diagnoses:  Suicidal ideation    Clinical Impression: 1. Suicidal ideation     Disposition: Awaiting psychiatric recommendations for suicidal ideation.   Tera Pellicane, Gwenyth Allegra, MD 01/06/17 [phone]

## 2017-01-06 NOTE — ED Notes (Signed)
Patient seen lying in bed with eyes closed. Opened his eyes in response to the greeting. Patient takes time to respond to any question and responds to few Yes or NO answers by nodding.  Appear anxious and sad. Patient is left alone at this time and will come back for CIWA assessment when patient is willing to participate/respond.  Will continue to monitor to monitor patient.

## 2017-01-06 NOTE — ED Triage Notes (Signed)
Pt brought in by EMS for c/o cough, suicidal, and ETOH intoxication  Pt states he has had a cough for the past 3 weeks  Productive at times, yellow sputum  Pt is intoxicated   Pt states his birthday was yesterday and no one loves him and no one came to see him  Pt states people smile to his face and then dont like him  Pt told EMS that he does not want to kill himself but wants someone to kill him  Pt asked this recorder to help him die   Pt crying in triage

## 2017-01-06 NOTE — ED Notes (Signed)
Patient more assertive at this time at one point became tearful. Continues to endorse SI, verbally contacts for safety. Denies pain, AH/VH at this time. Requested for food. Accepted snacks and drink offered. CIWA rated 5. No distress noted. Will continue to monitor patient.

## 2017-01-06 NOTE — ED Notes (Signed)
Bed: WA26 Expected date:  Expected time:  Means of arrival:  Comments: 

## 2017-01-06 NOTE — ED Notes (Signed)
Patient observed masturbating  multiple times in BR and patient room.

## 2017-01-07 DIAGNOSIS — F1290 Cannabis use, unspecified, uncomplicated: Secondary | ICD-10-CM

## 2017-01-07 DIAGNOSIS — F17210 Nicotine dependence, cigarettes, uncomplicated: Secondary | ICD-10-CM

## 2017-01-07 DIAGNOSIS — F1014 Alcohol abuse with alcohol-induced mood disorder: Secondary | ICD-10-CM

## 2017-01-07 DIAGNOSIS — R4587 Impulsiveness: Secondary | ICD-10-CM

## 2017-01-07 DIAGNOSIS — R45851 Suicidal ideations: Secondary | ICD-10-CM

## 2017-01-07 MED ORDER — MENTHOL 3 MG MT LOZG
1.0000 | LOZENGE | OROMUCOSAL | Status: DC | PRN
  Administered 2017-01-07: 3 mg via ORAL
  Filled 2017-01-07: qty 9

## 2017-01-07 NOTE — ED Notes (Signed)
"  I feeling better I guess I will leave you tomorrow". Pt calm and cooperative on approach. Pt thought process is organized and behavior is appropriate. Pt denies SI/HI/AVH and pain. Pt denies any withdrawal symptoms. Support and encouragement offered as needed.

## 2017-01-07 NOTE — ED Notes (Addendum)
Peer support at bedside 

## 2017-01-07 NOTE — Patient Outreach (Signed)
ED Peer Support Specialist Patient Intake (Complete at intake & 30-60 Day Follow-up)  Name: Shaun Brennan  MRN: [id number]  Age: 41 y.o.   Date of Admission: 01/07/2017  Intake:   Comments:      Primary Reason Admitted: Patient brought in by EMS for ETOH intoxication and S/I. Patient is actively intoxicated and cannot fully participate in the assessment. Patient is observed to be unstable as he attempts to ambulate. Information for purposes of assessment was gathered from history and admission notes. Per notes, patient states his birthday was yesterday and no one loves him and no one came to see him. Patient states people smile to his face and then don't like him. Patient states he "wants to die" but doesn't have a plan. Patient is sobbing uncontrollably and only able to answer a few assessment questions. Patient per note review, has one prior attempt at self harm by overdosing on medications. Patient will not elaborate on that incident this date and just shakes his head "no." Patient states he is currently homeless. Patient does deny H/I shaking his head "no" while sobbing.  No other information could be gathered on this patient at this time. Per note review patient did have a [facility] admission in 2015 for S/I and alcohol issues. Per note review patient has a prior diagnosis of depression. Case was staffed with Akintayo MD who recommended a inpatient admission as appropriate placement is investigated.   Lab values: Alcohol/ETOH:   Positive UDS?   Amphetamines:   Barbiturates:   Benzodiazepines:   Cocaine:   Opiates:   Cannabinoids:    Demographic information: Gender:   Ethnicity:   Marital Status:   Insurance Status:   Control and instrumentation engineer (Work Engineer, agricultural, Sales executive, etc.:   Lives with:   Living situation:    Reported Patient History: Patient reported health conditions:   Patient aware of HIV and hepatitis status:    In past year, has patient visited ED  for any reason?    Number of ED visits:    Reason(s) for visit:    In past year, has patient been hospitalized for any reason?    Number of hospitalizations:    Reason(s) for hospitalization:    In past year, has patient been arrested?    Number of arrests:    Reason(s) for arrest:    In past year, has patient been incarcerated?    Number of incarcerations:    Reason(s) for incarceration:    In past year, has patient received medication-assisted treatment?    In past year, patient received the following treatments:    In past year, has patient received any harm reduction services?    Did this include any of the following?    In past year, has patient received care from a mental health provider for diagnosis other than SUD?    In past year, is this first time patient has overdosed?    Number of past overdoses:    In past year, is this first time patient has been hospitalized for an overdose?    Number of hospitalizations for overdose(s):    Is patient currently receiving treatment for a mental health diagnosis?    Patient reports experiencing difficulty participating in SUD treatment:      Most important reason(s) for this difficulty?    Has patient received prior services for treatment?    In past, patient has received services from following agencies:    Plan of Care:  Suggested follow up at  these agencies/treatment centers:    Other information: Processed with Patient to about the Alcohol abuse and to see if Patient wanted to look into a treatment facility/    Merlinda Frederick, CPSS  01/07/2017 12:39 PM

## 2017-01-07 NOTE — Consult Note (Signed)
[facility] Consult   Reason for Consult:  Suicidal ideation without a plan Referring Physician:  EDP Patient Identification: Shaun Brennan MRN:  [id number] Principal Diagnosis: Alcohol abuse with alcohol-induced mood disorder ([place]) Diagnosis:   Patient Active Problem List   Diagnosis Date Noted  . Alcohol abuse with alcohol-induced mood disorder ([place]) [F10.14] 01/06/2017  . Pneumonia [J18.9] 10/03/2016  . Onychomycosis [B35.1] 09/18/2016  . Diabetes mellitus due to underlying condition, uncontrolled, with hyperglycemia, without long-term current use of insulin (HCC) [E08.65] 06/16/2016  . 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 Sepsis ([place]) [A41.9] 06/11/2016  . Acute respiratory failure with hypoxia ([place]) [J96.01] 06/11/2016  . Controlled type 2 diabetes mellitus with hyperglycemia ([place]) [E11.65] 06/11/2016  . Cough [R05]   . SOB (shortness of breath) [R06.02]   . Shortness of breath [R06.02] 01/17/2016  . Accelerated hypertension [I10]   . Hyperlipidemia [E78.5] 11/10/2015  . Diabetic neuropathy ([place]) [E11.40] 11/10/2015  . History of ETOH abuse [Z87.898] 09/08/2015  . Suicidal ideation [R45.851] 07/14/2014  . Major depression [F32.9] 07/14/2014  . Depression, major, single episode, moderate ([place]) [F32.1] 07/11/2014  . Community acquired pneumonia [J18.9] 07/07/2014  . Community acquired pneumonia of right lower lobe of lung ([place]) [J18.1] 07/07/2014  . HTN (hypertension) [I10] 12/17/2013  . Chronic systolic heart failure ([place]) [I50.22] 12/17/2013  . Diabetes mellitus due to underlying condition without complications ([place]) [R44.3] 12/12/2013  . Smoking [F17.200] 12/12/2013  . Acute on chronic systolic heart failure ([place]) [I50.23] 12/09/2013  . Acute respiratory distress [R06.03] 12/06/2013  . 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 DM2 (diabetes mellitus, type 2) ([place]) [E11.9] 12/06/2013   . HIV (human immunodeficiency virus infection) ([place]) [B20] 12/06/2013  . H/O CHF [Z86.79] 12/06/2013  . Cardiomegaly: per cxr 12/06/13 [I51.7] 12/06/2013    Total Time spent with patient: 45 minutes  Subjective:   Shaun Brennan is a 41 y.o. male patient admitted with suicidal ideation while "intoxicated".  HPI:  Pt was seen and chart reviewed with treatment team and Dr Darleene Cleaver. Pt's BAL 339 and UDS negative on admission. Pt stated he was thinking of self harm but had no plan. Today,  Pt denies suicidal/homicidal ideation, denies auditory/visual hallucinations and does not appear to be responding to internal stimuli. Pt stated he just wants to feel better about himself. Pt has services with [facility] for his HIV and sees [facility] for his medications.  Inpatient psychiatric hospitalization recommended when appropriate bed is available. Pt will be seen by Peer Support Specialist for additional help with community resources for substance abuse treatment.   Past Psychiatric History: As above  Risk to Self: none Risk to Others: None Prior Inpatient Therapy: Prior Inpatient Therapy: Yes Prior Therapy Dates: 2015 Prior Therapy Facilty/Provider(s): [facility] Reason for Treatment: S/I, MH issues Prior Outpatient Therapy: Prior Outpatient Therapy: No Prior Therapy Dates: NA Prior Therapy Facilty/Provider(s): NA Reason for Treatment: NA Does patient have an ACCT team?: No Does patient have Intensive In-House Services?  : No Does patient have [facility] services? : No Does patient have P4CC services?: No  Past Medical History:  Past Medical History:  Diagnosis Date  . CHF (congestive heart failure) ([place])   . Chronic systolic heart failure (HCC)    a. EF 15-20%, grade II DD, LA mild/mod dilated  . Diabetes mellitus without complication ([place])   . DM2 (diabetes mellitus, type 2) ([place])   . HIV (human immunodeficiency virus infection) (  [place])   . Hypertension     Past Surgical History:   Procedure Laterality Date  . ANKLE SURGERY Right    Family History:  Family History  Problem Relation Age of Onset  . Other Father   . Diabetes Mother   . Stroke Paternal Grandfather    Family Psychiatric  History: Unknown Social History:  History  Alcohol Use  . 9.0 oz/week  . 12 Cans of beer, 3 Shots of liquor per week     History  Drug Use  . Types: Marijuana    Social History   Social History  . Marital status: Single    Spouse name: N/A  . Number of children: N/A  . Years of education: N/A   Social History Main Topics  . Smoking status: Current Every Day Smoker    Packs/day: 0.25    Years: 15.00    Types: Cigarettes  . Smokeless tobacco: Never Used     Comment: about 10 cigarettes a week  . Alcohol use 9.0 oz/week    12 Cans of beer, 3 Shots of liquor per week  . Drug use: Yes    Types: Marijuana  . Sexual activity: Yes    Partners: Male    Birth control/ protection: Condom     Comment: pt. declined condoms   Other Topics Concern  . None   Social History Narrative   Currently homeless   Additional Social History:    Allergies:   Allergies  Allergen Reactions  . Lisinopril Cough    Labs:  Results for orders placed or performed during the hospital encounter of 01/06/17 (from the past 48 hour(s))  Comprehensive metabolic panel     Status: Abnormal   Collection Time: 01/06/17  6:14 AM  Result Value Ref Range   Sodium 135 135 - 145 mmol/L   Potassium 4.0 3.5 - 5.1 mmol/L   Chloride 97 (L) 101 - 111 mmol/L   CO2 25 22 - 32 mmol/L   Glucose, Bld 280 (H) 65 - 99 mg/dL   BUN 10 6 - 20 mg/dL   Creatinine, Ser 0.93 0.61 - 1.24 mg/dL   Calcium 9.3 8.9 - 10.3 mg/dL   Total Protein 8.3 (H) 6.5 - 8.1 g/dL   Albumin 3.8 3.5 - 5.0 g/dL   AST 46 (H) 15 - 41 U/L   ALT 42 17 - 63 U/L   Alkaline Phosphatase 75 38 - 126 U/L   Total Bilirubin 0.4 0.3 - 1.2 mg/dL   GFR calc non Af Amer >60 >60 mL/min   GFR calc Af Amer >60 >60 mL/min    Comment:  (NOTE) The eGFR has been calculated using the CKD EPI equation. This calculation has not been validated in all clinical situations. eGFR's persistently <60 mL/min signify possible Chronic Kidney Disease.    Anion gap 13 5 - 15  Ethanol     Status: Abnormal   Collection Time: 01/06/17  6:14 AM  Result Value Ref Range   Alcohol, Ethyl (B) 339 (HH) <10 mg/dL    Comment:        LOWEST DETECTABLE LIMIT FOR SERUM ALCOHOL IS 10 mg/dL FOR MEDICAL PURPOSES ONLY Please note change in reference range. CRITICAL RESULT CALLED TO, READ BACK BY AND VERIFIED WITH: P DOWD,RN '@[id number]'$  10/03/92 MKELLY   Salicylate level     Status: None   Collection Time: 01/06/17  6:14 AM  Result Value Ref Range   Salicylate Lvl <8.5 2.8 - 30.0 mg/dL  Acetaminophen level  Status: Abnormal   Collection Time: 01/06/17  6:14 AM  Result Value Ref Range   Acetaminophen (Tylenol), Serum <10 (L) 10 - 30 ug/mL    Comment:        THERAPEUTIC CONCENTRATIONS VARY SIGNIFICANTLY. A RANGE OF 10-30 ug/mL MAY BE AN EFFECTIVE CONCENTRATION FOR MANY PATIENTS. HOWEVER, SOME ARE BEST TREATED AT CONCENTRATIONS OUTSIDE THIS RANGE. ACETAMINOPHEN CONCENTRATIONS >150 ug/mL AT 4 HOURS AFTER INGESTION AND >50 ug/mL AT 12 HOURS AFTER INGESTION ARE OFTEN ASSOCIATED WITH TOXIC REACTIONS.   cbc     Status: Abnormal   Collection Time: 01/06/17  6:14 AM  Result Value Ref Range   WBC 5.6 4.0 - 10.5 K/uL   RBC 4.26 4.22 - 5.81 MIL/uL   Hemoglobin 14.8 13.0 - 17.0 g/dL   HCT 40.8 39.0 - 52.0 %   MCV 95.8 78.0 - 100.0 fL   MCH 34.7 (H) 26.0 - 34.0 pg   MCHC 36.3 (H) 30.0 - 36.0 g/dL   RDW 12.5 11.5 - 15.5 %   Platelets 145 (L) 150 - 400 K/uL  Rapid urine drug screen (hospital performed)     Status: None   Collection Time: 01/06/17  6:14 AM  Result Value Ref Range   Opiates NONE DETECTED NONE DETECTED   Cocaine NONE DETECTED NONE DETECTED   Benzodiazepines NONE DETECTED NONE DETECTED   Amphetamines NONE DETECTED NONE DETECTED    Tetrahydrocannabinol NONE DETECTED NONE DETECTED   Barbiturates NONE DETECTED NONE DETECTED    Comment:        DRUG SCREEN FOR MEDICAL PURPOSES ONLY.  IF CONFIRMATION IS NEEDED FOR ANY PURPOSE, NOTIFY LAB WITHIN 5 DAYS.        LOWEST DETECTABLE LIMITS FOR URINE DRUG SCREEN Drug Class       Cutoff (ng/mL) Amphetamine      1000 Barbiturate      200 Benzodiazepine   478 Tricyclics       295 Opiates          300 Cocaine          300 THC              50     Current Facility-Administered Medications  Medication Dose Route Frequency Provider Last Rate Last Dose  . aspirin EC tablet 81 mg  81 mg Oral Daily Tegeler, Gwenyth Allegra, MD   81 mg at 01/07/17 1018  . atorvastatin (LIPITOR) tablet 20 mg  20 mg Oral QHS Tegeler, Gwenyth Allegra, MD   20 mg at 01/06/17 2203  . carvedilol (COREG) tablet 25 mg  25 mg Oral BID WC Tegeler, Gwenyth Allegra, MD   25 mg at 01/07/17 6213  . dolutegravir (TIVICAY) tablet 50 mg  50 mg Oral Daily Tegeler, Gwenyth Allegra, MD   50 mg at 01/07/17 1017  . emtricitabine-tenofovir AF (DESCOVY) 200-25 MG per tablet 1 tablet  1 tablet Oral Daily Tegeler, Gwenyth Allegra, MD   1 tablet at 01/07/17 1018  . [START ON 01/08/2017] furosemide (LASIX) tablet 40 mg  40 mg Oral Once per day on Mon Fri Tegeler, Gwenyth Allegra, MD      . gabapentin (NEURONTIN) capsule 300 mg  300 mg Oral BID Mahki Spikes, MD   300 mg at 01/07/17 1017  . hydrALAZINE (APRESOLINE) tablet 100 mg  100 mg Oral BID Tegeler, Gwenyth Allegra, MD   100 mg at 01/07/17 0865  . isosorbide mononitrate (IMDUR) 24 hr tablet 60 mg  60 mg Oral Daily Tegeler, Gwenyth Allegra, MD  60 mg at 01/07/17 1018  . metFORMIN (GLUCOPHAGE) tablet 1,000 mg  1,000 mg Oral BID WC Tegeler, Gwenyth Allegra, MD   1,000 mg at 01/07/17 9323  . sacubitril-valsartan (ENTRESTO) 49-51 mg per tablet  1 tablet Oral BID Tegeler, Gwenyth Allegra, MD   1 tablet at 01/07/17 1018   Current Outpatient Prescriptions  Medication Sig Dispense Refill  .  aspirin 81 MG EC tablet Take 1 tablet (81 mg total) by mouth daily. 30 tablet 3  . atorvastatin (LIPITOR) 20 MG tablet Take 1 tablet (20 mg total) by mouth daily. 30 tablet 0  . blood glucose meter kit and supplies KIT Dispense based on patient and insurance preference. Use up to four times daily as directed. (FOR ICD-9 250.00, 250.01). 1 each 0  . carvedilol (COREG) 25 MG tablet TAKE 1 TABLET BY MOUTH 2 TIMES DAILY WITH A MEAL. 60 tablet 3  . DESCOVY 200-25 MG tablet TAKE 1 TABLET BY MOUTH EVERY DAY 30 tablet 5  . furosemide (LASIX) 40 MG tablet Take 1 tablet (40 mg total) by mouth 2 (two) times a week. On Monday and Friday 10 tablet 11  . hydrALAZINE (APRESOLINE) 50 MG tablet Take 2 tablets (100 mg total) by mouth 2 (two) times daily. 120 tablet 11  . isosorbide mononitrate (IMDUR) 60 MG 24 hr tablet Take 1 tablet (60 mg total) by mouth daily. 30 tablet 6  . metFORMIN (GLUCOPHAGE) 500 MG tablet Take 2 tablets (1,000 mg total) by mouth 2 (two) times daily with a meal. 60 tablet 0  . sacubitril-valsartan (ENTRESTO) 49-51 MG Take 1 tablet by mouth 2 (two) times daily. 180 tablet 3  . TIVICAY 50 MG tablet TAKE 1 TABLET BY MOUTH EVERY DAY 30 tablet 5  . azithromycin (ZITHROMAX) 250 MG tablet Take 1 tablet (250 mg total) by mouth daily. Stop taking 6/30 (Patient not taking: Reported on 01/06/2017) 2 tablet 0  . benzonatate (TESSALON) 200 MG capsule Take 1 capsule (200 mg total) by mouth 3 (three) times daily as needed for cough. (Patient not taking: Reported on 01/06/2017) 30 capsule 0  . cefdinir (OMNICEF) 300 MG capsule Take 1 capsule (300 mg total) by mouth 2 (two) times daily. Stop taking 6/30 (Patient not taking: Reported on 01/06/2017) 5 capsule 0  . insulin NPH-regular Human (NOVOLIN 70/30) (70-30) 100 UNIT/ML injection Inject 15 Units into the skin 2 (two) times daily with a meal. (Patient not taking: Reported on 10/03/2016) 10 mL 11    Musculoskeletal: Strength & Muscle Tone: within normal  limits Gait & Station: normal Patient leans: N/A  Psychiatric Specialty Exam: Physical Exam  Constitutional: He is oriented to person, place, and time. He appears well-developed and well-nourished.  HENT:  Head: Normocephalic.  Respiratory: Effort normal.  Musculoskeletal: Normal range of motion.  Neurological: He is alert and oriented to person, place, and time.  Psychiatric: His speech is normal and behavior is normal. Cognition and memory are normal. He expresses impulsivity. He exhibits a depressed mood. He expresses suicidal (passive without a plan) ideation.    Review of Systems  Psychiatric/Behavioral: Positive for depression, substance abuse and suicidal ideas (passive, when intoxicated, without plan). Negative for hallucinations and memory loss. The patient is not nervous/anxious and does not have insomnia.   All other systems reviewed and are negative.   Blood pressure (!) 145/103, pulse 84, temperature 98.7 F (37.1 C), temperature source Oral, resp. rate 16, SpO2 100 %.There is no height or weight on file to calculate BMI.  General Appearance: Casual  Eye Contact:  Fair  Speech:  Clear and Coherent and Normal Rate  Volume:  Decreased  Mood:  Anxious, Depressed and Hopeless  Affect:  Congruent and Depressed  Thought Process:  Coherent and Linear  Orientation:  Full (Time, Place, and Person)  Thought Content:  Logical  Suicidal Thoughts:  Yes.  without intent/plan  Homicidal Thoughts:  No  Memory:  Immediate;   Good Recent;   Fair Remote;   Fair  Judgement:  Fair  Insight:  Fair  Psychomotor Activity:  Normal  Concentration:  Concentration: Fair and Attention Span: Fair  Recall:  Good  Fund of Knowledge:  Good  Language:  Good  Akathisia:  No  Handed:  Right  AIMS (if indicated):     Assets:  Communication Skills Desire for Improvement Resilience  ADL's:  Intact  Cognition:  WNL  Sleep:      Treatment Plan Summary: Daily contact with patient to assess and  evaluate symptoms and progress in treatment and Medication management  Continue these medications: -Gabapentin 300 mg BID  Crisis stabilization  Disposition: Recommend psychiatric Inpatient admission when medically cleared.TTS to seek placement.  Ethelene Hal, NP 01/07/2017 1:26 PM  Patient seen face-to-face for psychiatric evaluation, chart reviewed and case discussed with the physician extender and developed treatment plan. Reviewed the information documented and agree with the treatment plan. Corena Pilgrim, MD

## 2017-01-07 NOTE — ED Notes (Signed)
Pt observed in bed majority of the shift. Pt irritable at times, uninterested. Pt denies SI/HI/AVH. Encouragement and support provided. Special checks q 15 mins in place for safety, Video monitoring in place. Will continue to monitor.

## 2017-01-08 MED ORDER — GABAPENTIN 300 MG PO CAPS: 300.0000 mg | ORAL_CAPSULE | Freq: Two times a day (BID) | ORAL | 0 refills | Status: DC
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
## 2017-01-08 NOTE — Progress Notes (Signed)
Inpatient Diabetes Program Recommendations  AACE/ADA: New Consensus Statement on Inpatient Glycemic Control (2015)  Target Ranges:  Prepandial:   less than 140 mg/dL      Peak postprandial:   less than 180 mg/dL (1-2 hours)      Critically ill patients:  140 - 180 mg/dL   Results for Shaun Brennan, Shaun Brennan (MRN [id number]) as of 01/08/2017 10:55  Ref. Range 01/06/2017 06:14  Glucose Latest Ref Range: 65 - 99 mg/dL 116 (H)    Home DM Meds: Metformin 1000 mg BID       70/30 Insulin- 15 units BID (not taking per patient report)  Current Insulin Orders: Metformin 1000 mg BID      MD- Please consider the following in-hospital insulin adjustments:  1. Please start Novolog Moderate Correction Scale/ SSI (0-15 units) TID AC + HS  2. Please change diet to Carbohydrate Modified diet      --Will follow patient during hospitalization--  Ambrose Finland RN, MSN, CDE Diabetes Coordinator Inpatient Glycemic Control Team Team Pager: [phone] (8a-5p)

## 2017-01-08 NOTE — BH Assessment (Signed)
[facility] Assessment Progress Note  Per Thedore Mins, MD, this pt does not require psychiatric hospitalization at this time.  Pt receives wrap around services from the infectious disease clinic, and does not need referrals.  Pt was to be seen by the Peer Support Specialist, but he has refused this service.  Pt's nurse, Diane, has been notified.  Doylene Canning, MA Triage Specialist [phone]

## 2017-01-08 NOTE — [facility] Suicide Risk Assessment (Signed)
Suicide Risk Assessment  Discharge Assessment   [facility] Discharge Suicide Risk Assessment   Principal Problem: Alcohol abuse with alcohol-induced mood disorder [facility]) Discharge Diagnoses:  Patient Active Problem List   Diagnosis Date Noted  . Alcohol abuse with alcohol-induced mood disorder (HCC) [F10.14] 01/06/2017  . Pneumonia [J18.9] 10/03/2016  . Onychomycosis [B35.1] 09/18/2016  . Diabetes mellitus due to underlying condition, uncontrolled, with hyperglycemia, without long-term current use of insulin (HCC) [E08.65] 06/16/2016  . Acute renal failure superimposed on chronic kidney disease, on chronic dialysis (HCC) [N17.9, N18.9, Z99.2]   . Sepsis (HCC) [A41.9] 06/11/2016  . Acute respiratory failure with hypoxia (HCC) [J96.01] 06/11/2016  . Controlled type 2 diabetes mellitus with hyperglycemia (HCC) [E11.65] 06/11/2016  . Cough [R05]   . SOB (shortness of breath) [R06.02]   . Shortness of breath [R06.02] 01/17/2016  . Accelerated hypertension [I10]   . Hyperlipidemia [E78.5] 11/10/2015  . Diabetic neuropathy (HCC) [E11.40] 11/10/2015  . History of ETOH abuse [Z87.898] 09/08/2015  . Suicidal ideation [R45.851] 07/14/2014  . Major depression [F32.9] 07/14/2014  . Depression, major, single episode, moderate (HCC) [F32.1] 07/11/2014  . Community acquired pneumonia [J18.9] 07/07/2014  . Community acquired pneumonia of right lower lobe of lung (HCC) [J18.1] 07/07/2014  . HTN (hypertension) [I10] 12/17/2013  . Chronic systolic heart failure (HCC) [I50.22] 12/17/2013  . Diabetes mellitus due to underlying condition without complications (HCC) [E08.9] 12/12/2013  . Smoking [F17.200] 12/12/2013  . Acute on chronic systolic heart failure (HCC) [I50.23] 12/09/2013  . Acute respiratory distress [R06.03] 12/06/2013  . 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 DM2 (diabetes mellitus, type 2) (HCC) [E11.9] 12/06/2013  . HIV (human immunodeficiency virus infection) (HCC) [B20] 12/06/2013  . H/O  CHF [Z86.79] 12/06/2013  . Cardiomegaly: per cxr 12/06/13 [I51.7] 12/06/2013    Total Time spent with patient: 45 minutes  Musculoskeletal: Strength & Muscle Tone: within normal limits Gait & Station: normal Patient leans: N/A  Psychiatric Specialty Exam: Physical Exam  Constitutional: He is oriented to person, place, and time. He appears well-developed and well-nourished.  HENT:  Head: Normocephalic.  Respiratory: Effort normal.  Musculoskeletal: Normal range of motion.  Neurological: He is alert and oriented to person, place, and time.  Psychiatric: He has a normal mood and affect. His speech is normal and behavior is normal. Thought content normal. Cognition and memory are normal. He expresses impulsivity. He does not exhibit a depressed mood. He expresses no suicidal (passive without a plan) ideation.   Review of Systems  Psychiatric/Behavioral: Positive for depression and substance abuse. Negative for hallucinations, memory loss and suicidal ideas (passive, when intoxicated, without plan). The patient is not nervous/anxious and does not have insomnia.   All other systems reviewed and are negative.  Blood pressure (!) 153/94, pulse 100, temperature 98.8 F (37.1 C), temperature source Oral, resp. rate 17, SpO2 100 %.There is no height or weight on file to calculate BMI. General Appearance: Casual Eye Contact:  Good Speech:  Clear and Coherent and Normal Rate Volume:  Normal Mood:  Euthymic Affect:  Congruent Thought Process:  Coherent and Linear Orientation:  Full (Time, Place, and Person) Thought Content:  Logical Suicidal Thoughts:  No Homicidal Thoughts:  No Memory:  Immediate;   Good Recent;   Good Remote;   Fair Judgement:  Fair Insight:  Fair Psychomotor Activity:  Normal Concentration:  Concentration: Good and Attention Span: Good Recall:  Good Fund of Knowledge:  Good Language:  Good Akathisia:  No Handed:  Right AIMS (if indicated):  Assets:   Communication Skills Desire for Improvement Financial Resources/Insurance Housing Resilience Social Support ADL's:  Intact Cognition:  WNL   Mental Status Per Nursing Assessment::   On Admission:   suicidal ideation without a plan  Demographic Factors:  Male, Gay, lesbian, or bisexual orientation and Low socioeconomic status  Loss Factors: Financial problems/change in socioeconomic status  Historical Factors: Impulsivity  Risk Reduction Factors:   Sense of responsibility to family, Living with another person, especially a relative and Positive social support  Continued Clinical Symptoms:  Depression:   Hopelessness Impulsivity Alcohol/Substance Abuse/Dependencies Previous Psychiatric Diagnoses and Treatments Medical Diagnoses and Treatments/Surgeries  Cognitive Features That Contribute To Risk:  Closed-mindedness    Suicide Risk:  Minimal: No identifiable suicidal ideation.  Patients presenting with no risk factors but with morbid ruminations; may be classified as minimal risk based on the severity of the depressive symptoms    Plan Of Care/Follow-up recommendations:  Activity:  as tolerated Diet:  Heart Healthy  Laveda Abbe, NP 01/08/2017, 10:57 AM

## 2017-01-08 NOTE — Consult Note (Signed)
[facility] Psychiatry Consult   Reason for Consult:  Suicidal ideation without a plan Referring Physician:  EDP Patient Identification: Shaun Brennan MRN:  [id number] Principal Diagnosis: Alcohol abuse with alcohol-induced mood disorder ([place]) Diagnosis:   Patient Active Problem List   Diagnosis Date Noted  . Alcohol abuse with alcohol-induced mood disorder ([place]) [F10.14] 01/06/2017  . Pneumonia [J18.9] 10/03/2016  . Onychomycosis [B35.1] 09/18/2016  . Diabetes mellitus due to underlying condition, uncontrolled, with hyperglycemia, without long-term current use of insulin (HCC) [E08.65] 06/16/2016  . Acute renal failure superimposed on chronic kidney disease, on chronic dialysis ([place]) [N17.9, N18.9, Z99.2]   . Sepsis ([place]) [A41.9] 06/11/2016  . Acute respiratory failure with hypoxia ([place]) [J96.01] 06/11/2016  . Controlled type 2 diabetes mellitus with hyperglycemia ([place]) [E11.65] 06/11/2016  . Cough [R05]   . SOB (shortness of breath) [R06.02]   . Shortness of breath [R06.02] 01/17/2016  . Accelerated hypertension [I10]   . Hyperlipidemia [E78.5] 11/10/2015  . Diabetic neuropathy ([place]) [E11.40] 11/10/2015  . History of ETOH abuse [Z87.898] 09/08/2015  . Suicidal ideation [R45.851] 07/14/2014  . Major depression [F32.9] 07/14/2014  . Depression, major, single episode, moderate ([place]) [F32.1] 07/11/2014  . Community acquired pneumonia [J18.9] 07/07/2014  . Community acquired pneumonia of right lower lobe of lung ([place]) [J18.1] 07/07/2014  . HTN (hypertension) [I10] 12/17/2013  . Chronic systolic heart failure ([place]) [I50.22] 12/17/2013  . Diabetes mellitus due to underlying condition without complications ([place]) [K24.0] 12/12/2013  . Smoking [F17.200] 12/12/2013  . Acute on chronic systolic heart failure ([place]) [I50.23] 12/09/2013  . Acute respiratory distress [R06.03] 12/06/2013  . 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 DM2 (diabetes mellitus, type 2) ([place]) [E11.9] 12/06/2013   . HIV (human immunodeficiency virus infection) ([place]) [B20] 12/06/2013  . H/O CHF [Z86.79] 12/06/2013  . Cardiomegaly: per cxr 12/06/13 [I51.7] 12/06/2013    Total Time spent with patient: 45 minutes  Subjective:   Shaun Brennan is a 41 y.o. male patient admitted with suicidal ideation while "intoxicated".  HPI:  Pt was seen and chart reviewed with treatment team and Dr Darleene Cleaver. Pt's BAL 339 and UDS negative on admission. Pt stated he was thinking of self harm but had no plan. Today,  Pt denies suicidal/homicidal ideation, denies auditory/visual hallucinations and does not appear to be responding to internal stimuli. Pt stated he feels better today and is ready to be discharged. Pt stated he will make an appointment with [facility] when he is discharged.  Pt has services with [facility] for his HIV and sees [facility] for his medications. Pt was seen by Peer Support Specialist for additional help with community resources for substance abuse treatment. Pt is psychiatrically cleared for discharge.   Past Psychiatric History: As above  Risk to Self: none Risk to Others: None Prior Inpatient Therapy: Prior Inpatient Therapy: Yes Prior Therapy Dates: 2015 Prior Therapy Facilty/Provider(s): [facility] Reason for Treatment: S/I, MH issues Prior Outpatient Therapy: Prior Outpatient Therapy: No Prior Therapy Dates: NA Prior Therapy Facilty/Provider(s): NA Reason for Treatment: NA Does patient have an ACCT team?: No Does patient have Intensive In-House Services?  : No Does patient have [facility] services? : No Does patient have P4CC services?: No  Past Medical History:  Past Medical History:  Diagnosis Date  . CHF (congestive heart failure) ([place])   . Chronic systolic heart failure (HCC)    a. EF 15-20%, grade II DD, LA mild/mod dilated  . Diabetes mellitus without complication ([place])   . DM2 (diabetes mellitus,  type 2) ([place])   . HIV (human immunodeficiency virus infection) ([place])   .  Hypertension     Past Surgical History:  Procedure Laterality Date  . ANKLE SURGERY Right    Family History:  Family History  Problem Relation Age of Onset  . Other Father   . Diabetes Mother   . Stroke Paternal Grandfather    Family Psychiatric  History: Unknown Social History:  History  Alcohol Use  . 9.0 oz/week  . 12 Cans of beer, 3 Shots of liquor per week     History  Drug Use  . Types: Marijuana    Social History   Social History  . Marital status: Single    Spouse name: N/A  . Number of children: N/A  . Years of education: N/A   Social History Main Topics  . Smoking status: Current Every Day Smoker    Packs/day: 0.25    Years: 15.00    Types: Cigarettes  . Smokeless tobacco: Never Used     Comment: about 10 cigarettes a week  . Alcohol use 9.0 oz/week    12 Cans of beer, 3 Shots of liquor per week  . Drug use: Yes    Types: Marijuana  . Sexual activity: Yes    Partners: Male    Birth control/ protection: Condom     Comment: pt. declined condoms   Other Topics Concern  . None   Social History Narrative   Currently homeless   Additional Social History:    Allergies:   Allergies  Allergen Reactions  . Lisinopril Cough    Labs:  No results found for this or any previous visit (from the past 48 hour(s)).  Current Facility-Administered Medications  Medication Dose Route Frequency Provider Last Rate Last Dose  . aspirin EC tablet 81 mg  81 mg Oral Daily Tegeler, Gwenyth Allegra, MD   81 mg at 01/08/17 1008  . atorvastatin (LIPITOR) tablet 20 mg  20 mg Oral QHS Tegeler, Gwenyth Allegra, MD   20 mg at 01/07/17 2121  . carvedilol (COREG) tablet 25 mg  25 mg Oral BID WC Tegeler, Gwenyth Allegra, MD   25 mg at 01/08/17 0756  . dolutegravir (TIVICAY) tablet 50 mg  50 mg Oral Daily Tegeler, Gwenyth Allegra, MD   50 mg at 01/08/17 1008  . emtricitabine-tenofovir AF (DESCOVY) 200-25 MG per tablet 1 tablet  1 tablet Oral Daily Tegeler, Gwenyth Allegra, MD   1  tablet at 01/08/17 1009  . furosemide (LASIX) tablet 40 mg  40 mg Oral Once per day on Mon Fri Tegeler, Gwenyth Allegra, MD   40 mg at 01/08/17 1008  . gabapentin (NEURONTIN) capsule 300 mg  300 mg Oral BID Darleene Cleaver, Takisha Pelle, MD   300 mg at 01/08/17 1008  . hydrALAZINE (APRESOLINE) tablet 100 mg  100 mg Oral BID Tegeler, Gwenyth Allegra, MD   100 mg at 01/08/17 0756  . isosorbide mononitrate (IMDUR) 24 hr tablet 60 mg  60 mg Oral Daily Tegeler, Gwenyth Allegra, MD   60 mg at 01/08/17 1008  . menthol-cetylpyridinium (CEPACOL) lozenge 3 mg  1 lozenge Oral PRN Ethelene Hal, NP   3 mg at 01/07/17 1656  . metFORMIN (GLUCOPHAGE) tablet 1,000 mg  1,000 mg Oral BID WC Tegeler, Gwenyth Allegra, MD   1,000 mg at 01/08/17 0756  . sacubitril-valsartan (ENTRESTO) 49-51 mg per tablet  1 tablet Oral BID Tegeler, Gwenyth Allegra, MD   1 tablet at 01/08/17 1008   Current Outpatient  Prescriptions  Medication Sig Dispense Refill  . aspirin 81 MG EC tablet Take 1 tablet (81 mg total) by mouth daily. 30 tablet 3  . atorvastatin (LIPITOR) 20 MG tablet Take 1 tablet (20 mg total) by mouth daily. 30 tablet 0  . blood glucose meter kit and supplies KIT Dispense based on patient and insurance preference. Use up to four times daily as directed. (FOR ICD-9 250.00, 250.01). 1 each 0  . carvedilol (COREG) 25 MG tablet TAKE 1 TABLET BY MOUTH 2 TIMES DAILY WITH A MEAL. 60 tablet 3  . DESCOVY 200-25 MG tablet TAKE 1 TABLET BY MOUTH EVERY DAY 30 tablet 5  . furosemide (LASIX) 40 MG tablet Take 1 tablet (40 mg total) by mouth 2 (two) times a week. On Monday and Friday 10 tablet 11  . hydrALAZINE (APRESOLINE) 50 MG tablet Take 2 tablets (100 mg total) by mouth 2 (two) times daily. 120 tablet 11  . isosorbide mononitrate (IMDUR) 60 MG 24 hr tablet Take 1 tablet (60 mg total) by mouth daily. 30 tablet 6  . metFORMIN (GLUCOPHAGE) 500 MG tablet Take 2 tablets (1,000 mg total) by mouth 2 (two) times daily with a meal. 60 tablet 0  .  sacubitril-valsartan (ENTRESTO) 49-51 MG Take 1 tablet by mouth 2 (two) times daily. 180 tablet 3  . TIVICAY 50 MG tablet TAKE 1 TABLET BY MOUTH EVERY DAY 30 tablet 5  . azithromycin (ZITHROMAX) 250 MG tablet Take 1 tablet (250 mg total) by mouth daily. Stop taking 6/30 (Patient not taking: Reported on 01/06/2017) 2 tablet 0  . benzonatate (TESSALON) 200 MG capsule Take 1 capsule (200 mg total) by mouth 3 (three) times daily as needed for cough. (Patient not taking: Reported on 01/06/2017) 30 capsule 0  . cefdinir (OMNICEF) 300 MG capsule Take 1 capsule (300 mg total) by mouth 2 (two) times daily. Stop taking 6/30 (Patient not taking: Reported on 01/06/2017) 5 capsule 0  . insulin NPH-regular Human (NOVOLIN 70/30) (70-30) 100 UNIT/ML injection Inject 15 Units into the skin 2 (two) times daily with a meal. (Patient not taking: Reported on 10/03/2016) 10 mL 11    Musculoskeletal: Strength & Muscle Tone: within normal limits Gait & Station: normal Patient leans: N/A  Psychiatric Specialty Exam: Physical Exam  Constitutional: He is oriented to person, place, and time. He appears well-developed and well-nourished.  HENT:  Head: Normocephalic.  Respiratory: Effort normal.  Musculoskeletal: Normal range of motion.  Neurological: He is alert and oriented to person, place, and time.  Psychiatric: He has a normal mood and affect. His speech is normal and behavior is normal. Thought content normal. Cognition and memory are normal. He expresses impulsivity. He does not exhibit a depressed mood. He expresses no suicidal (passive without a plan) ideation.    Review of Systems  Psychiatric/Behavioral: Positive for depression and substance abuse. Negative for hallucinations, memory loss and suicidal ideas (passive, when intoxicated, without plan). The patient is not nervous/anxious and does not have insomnia.   All other systems reviewed and are negative.   Blood pressure (!) 153/94, pulse 100, temperature  98.8 F (37.1 C), temperature source Oral, resp. rate 17, SpO2 100 %.There is no height or weight on file to calculate BMI.  General Appearance: Casual  Eye Contact:  Good  Speech:  Clear and Coherent and Normal Rate  Volume:  Normal  Mood:  Euthymic  Affect:  Congruent  Thought Process:  Coherent and Linear  Orientation:  Full (Time, Place, and  Person)  Thought Content:  Logical  Suicidal Thoughts:  No  Homicidal Thoughts:  No  Memory:  Immediate;   Good Recent;   Good Remote;   Fair  Judgement:  Fair  Insight:  Fair  Psychomotor Activity:  Normal  Concentration:  Concentration: Good and Attention Span: Good  Recall:  Good  Fund of Knowledge:  Good  Language:  Good  Akathisia:  No  Handed:  Right  AIMS (if indicated):     Assets:  Communication Skills Desire for Improvement Financial Resources/Insurance Housing Resilience Social Support  ADL's:  Intact  Cognition:  WNL  Sleep:      Treatment Plan Summary: Plan Alcohol abuse with alcohol induced mood disorder  Discharge Home Follow up with [facility] for HIV Follow up with PCP at  [facility]  Avoid the use of alcohol and illicit drugs Take all medications as prescribed    Disposition: No evidence of imminent risk to self or others at present.   Patient does not meet criteria for psychiatric inpatient admission. Supportive therapy provided about ongoing stressors. Discussed crisis plan, support from social network, calling 911, coming to the Emergency Department, and calling Suicide Hotline.  Ethelene Hal, NP 01/08/2017 10:46 AM  Patient seen face-to-face for psychiatric evaluation, chart reviewed and case discussed with the physician extender and developed treatment plan. Reviewed the information documented and agree with the treatment plan. Corena Pilgrim, MD

## 2017-01-24 ENCOUNTER — Other Ambulatory Visit (HOSPITAL_COMMUNITY): Payer: Self-pay | Admitting: Student

## 2017-01-24 DIAGNOSIS — E785 Hyperlipidemia, unspecified: Secondary | ICD-10-CM

## 2017-02-07 MED FILL — ISOSORBIDE MN ER 60 MG TAB: 60 | 34 days supply | Qty: 34 | Fill #0

## 2017-02-07 MED FILL — ATORVASTATIN 20 MG TABLET: 20 | 34 days supply | Qty: 34 | Fill #0

## 2017-02-07 MED FILL — hydrALAZINE HCL 50 MG TABS: 50 | 34 days supply | Qty: 68 | Fill #0

## 2017-02-07 MED FILL — CARVEDILOL 25 MG TABLET: 25 | 34 days supply | Qty: 68 | Fill #0

## 2017-03-03 ENCOUNTER — Emergency Department (HOSPITAL_COMMUNITY): Payer: Self-pay

## 2017-03-03 ENCOUNTER — Encounter (HOSPITAL_COMMUNITY): Payer: Self-pay | Admitting: Emergency Medicine

## 2017-03-03 ENCOUNTER — Ambulatory Visit (HOSPITAL_COMMUNITY): Payer: Self-pay

## 2017-03-03 ENCOUNTER — Emergency Department (HOSPITAL_COMMUNITY)
Admission: EM | Admit: 2017-03-03 | Discharge: 2017-03-03 | Discharge status: Against Medical Advice | Payer: Self-pay | Attending: Emergency Medicine | Admitting: Emergency Medicine

## 2017-03-03 DIAGNOSIS — Z794 Long term (current) use of insulin: Secondary | ICD-10-CM | POA: Insufficient documentation

## 2017-03-03 DIAGNOSIS — I110 Hypertensive heart disease with heart failure: Secondary | ICD-10-CM | POA: Insufficient documentation

## 2017-03-03 DIAGNOSIS — Z23 Encounter for immunization: Secondary | ICD-10-CM | POA: Insufficient documentation

## 2017-03-03 DIAGNOSIS — W19XXXA Unspecified fall, initial encounter: Secondary | ICD-10-CM

## 2017-03-03 DIAGNOSIS — S0990XA Unspecified injury of head, initial encounter: Secondary | ICD-10-CM | POA: Insufficient documentation

## 2017-03-03 DIAGNOSIS — F17210 Nicotine dependence, cigarettes, uncomplicated: Secondary | ICD-10-CM | POA: Insufficient documentation

## 2017-03-03 DIAGNOSIS — S0181XA Laceration without foreign body of other part of head, initial encounter: Secondary | ICD-10-CM | POA: Insufficient documentation

## 2017-03-03 DIAGNOSIS — Y9289 Other specified places as the place of occurrence of the external cause: Secondary | ICD-10-CM | POA: Insufficient documentation

## 2017-03-03 DIAGNOSIS — Y998 Other external cause status: Secondary | ICD-10-CM | POA: Insufficient documentation

## 2017-03-03 DIAGNOSIS — E1140 Type 2 diabetes mellitus with diabetic neuropathy, unspecified: Secondary | ICD-10-CM | POA: Insufficient documentation

## 2017-03-03 DIAGNOSIS — I5022 Chronic systolic (congestive) heart failure: Secondary | ICD-10-CM | POA: Insufficient documentation

## 2017-03-03 DIAGNOSIS — Z79899 Other long term (current) drug therapy: Secondary | ICD-10-CM | POA: Insufficient documentation

## 2017-03-03 DIAGNOSIS — Z7982 Long term (current) use of aspirin: Secondary | ICD-10-CM | POA: Insufficient documentation

## 2017-03-03 DIAGNOSIS — T148XXA Other injury of unspecified body region, initial encounter: Secondary | ICD-10-CM | POA: Insufficient documentation

## 2017-03-03 DIAGNOSIS — Y939 Activity, unspecified: Secondary | ICD-10-CM | POA: Insufficient documentation

## 2017-03-03 DIAGNOSIS — B20 Human immunodeficiency virus [HIV] disease: Secondary | ICD-10-CM | POA: Insufficient documentation

## 2017-03-03 DIAGNOSIS — E785 Hyperlipidemia, unspecified: Secondary | ICD-10-CM | POA: Insufficient documentation

## 2017-03-03 DIAGNOSIS — T07XXXA Unspecified multiple injuries, initial encounter: Secondary | ICD-10-CM

## 2017-03-03 MED ORDER — LORAZEPAM 2 MG/ML IJ SOLN: 1.0000 mg | INTRAMUSCULAR | Status: DC | PRN

## 2017-03-03 MED ORDER — LIDOCAINE-EPINEPHRINE (PF) 2 %-1:200000 IJ SOLN
20.0000 mL | Freq: Once | INTRAMUSCULAR | Status: AC
  Administered 2017-03-03: 20 mL
  Filled 2017-03-03: qty 20

## 2017-03-03 MED ORDER — TETANUS-DIPHTH-ACELL PERTUSSIS 5-2.5-18.5 LF-MCG/0.5 IM SUSP
0.5000 mL | Freq: Once | INTRAMUSCULAR | Status: AC
  Administered 2017-03-03: 0.5 mL via INTRAMUSCULAR
  Filled 2017-03-03: qty 0.5

## 2017-03-03 NOTE — ED Notes (Signed)
Pt pulled c-collar off, stated "I can't do this test, I'm leaving". Wants to leave AMA
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
## 2017-03-03 NOTE — ED Notes (Signed)
While getting pt blood pressure, pt starts yelling and cussing at partner. Pt states that he is leaving, continues to argue with partner, and walks out of triage.

## 2017-03-03 NOTE — ED Notes (Signed)
Pt becoming anxious and agitated after being told MRI ordered. Pt shouting, "I'm not going in that machine, I'm going to leave!". Pt told he can have dose of medicine to help him stay calm. Pt still adamant about leaving. Agreeable to c-collar being placed on. Dr. Oletta Cohn to go talk to patient.

## 2017-03-03 NOTE — ED Notes (Signed)
Pt and partner both discharged, being escorted out by security

## 2017-03-03 NOTE — ED Notes (Signed)
Pt up to [facility] now agrees to have MRI done, undo DC and placed back in waiting room. EDP aware
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
## 2017-03-03 NOTE — ED Notes (Signed)
Pt jumped off MRI and left, seen walking out ED door stable

## 2017-03-06 ENCOUNTER — Ambulatory Visit (HOSPITAL_COMMUNITY)
Admission: EM | Admit: 2017-03-06 | Discharge: 2017-03-06 | Disposition: A | Discharge status: Home / Self Care | Payer: Self-pay | Attending: Family Medicine | Admitting: Family Medicine

## 2017-03-06 ENCOUNTER — Encounter (HOSPITAL_COMMUNITY): Payer: Self-pay | Admitting: Family Medicine

## 2017-03-06 ENCOUNTER — Other Ambulatory Visit: Payer: Self-pay

## 2017-03-06 NOTE — ED Notes (Signed)
Pt called for triage x 1, no answer. 

## 2017-03-06 NOTE — ED Triage Notes (Signed)
Pt here for suture removal under chin. 1 suture removed.

## 2017-03-12 ENCOUNTER — Inpatient Hospital Stay (HOSPITAL_COMMUNITY)
Admission: EM | Admit: 2017-03-12 | Discharge: 2017-03-20 | DRG: 974 | Disposition: A | Discharge status: Home / Self Care | Payer: Self-pay | Attending: Internal Medicine | Admitting: Internal Medicine

## 2017-03-12 ENCOUNTER — Other Ambulatory Visit: Payer: Self-pay

## 2017-03-12 ENCOUNTER — Other Ambulatory Visit (HOSPITAL_COMMUNITY): Payer: Self-pay

## 2017-03-12 ENCOUNTER — Emergency Department (HOSPITAL_COMMUNITY): Payer: Self-pay

## 2017-03-12 ENCOUNTER — Encounter (HOSPITAL_COMMUNITY): Payer: Self-pay | Admitting: *Deleted

## 2017-03-12 DIAGNOSIS — F1014 Alcohol abuse with alcohol-induced mood disorder: Secondary | ICD-10-CM | POA: Diagnosis present

## 2017-03-12 DIAGNOSIS — G4700 Insomnia, unspecified: Secondary | ICD-10-CM

## 2017-03-12 DIAGNOSIS — B20 Human immunodeficiency virus [HIV] disease: Principal | ICD-10-CM

## 2017-03-12 DIAGNOSIS — E1165 Type 2 diabetes mellitus with hyperglycemia: Secondary | ICD-10-CM | POA: Diagnosis present

## 2017-03-12 DIAGNOSIS — J9601 Acute respiratory failure with hypoxia: Secondary | ICD-10-CM | POA: Diagnosis present

## 2017-03-12 DIAGNOSIS — T380X5A Adverse effect of glucocorticoids and synthetic analogues, initial encounter: Secondary | ICD-10-CM | POA: Diagnosis not present

## 2017-03-12 DIAGNOSIS — I10 Essential (primary) hypertension: Secondary | ICD-10-CM | POA: Diagnosis present

## 2017-03-12 DIAGNOSIS — R0602 Shortness of breath: Secondary | ICD-10-CM

## 2017-03-12 DIAGNOSIS — T501X6A Underdosing of loop [high-ceiling] diuretics, initial encounter: Secondary | ICD-10-CM | POA: Diagnosis present

## 2017-03-12 DIAGNOSIS — E871 Hypo-osmolality and hyponatremia: Secondary | ICD-10-CM | POA: Diagnosis present

## 2017-03-12 DIAGNOSIS — Z823 Family history of stroke: Secondary | ICD-10-CM

## 2017-03-12 DIAGNOSIS — Z79899 Other long term (current) drug therapy: Secondary | ICD-10-CM

## 2017-03-12 DIAGNOSIS — R0603 Acute respiratory distress: Secondary | ICD-10-CM | POA: Diagnosis present

## 2017-03-12 DIAGNOSIS — Z538 Procedure and treatment not carried out for other reasons: Secondary | ICD-10-CM | POA: Diagnosis not present

## 2017-03-12 DIAGNOSIS — F17210 Nicotine dependence, cigarettes, uncomplicated: Secondary | ICD-10-CM

## 2017-03-12 DIAGNOSIS — Z9114 Patient's other noncompliance with medication regimen: Secondary | ICD-10-CM

## 2017-03-12 DIAGNOSIS — B59 Pneumocystosis: Secondary | ICD-10-CM | POA: Diagnosis present

## 2017-03-12 DIAGNOSIS — Z888 Allergy status to other drugs, medicaments and biological substances status: Secondary | ICD-10-CM

## 2017-03-12 DIAGNOSIS — I110 Hypertensive heart disease with heart failure: Secondary | ICD-10-CM

## 2017-03-12 DIAGNOSIS — E119 Type 2 diabetes mellitus without complications: Secondary | ICD-10-CM

## 2017-03-12 DIAGNOSIS — J189 Pneumonia, unspecified organism: Secondary | ICD-10-CM | POA: Diagnosis present

## 2017-03-12 DIAGNOSIS — Z7982 Long term (current) use of aspirin: Secondary | ICD-10-CM

## 2017-03-12 DIAGNOSIS — Z794 Long term (current) use of insulin: Secondary | ICD-10-CM

## 2017-03-12 DIAGNOSIS — E1169 Type 2 diabetes mellitus with other specified complication: Secondary | ICD-10-CM

## 2017-03-12 DIAGNOSIS — R402413 Glasgow coma scale score 13-15, at hospital admission: Secondary | ICD-10-CM | POA: Diagnosis present

## 2017-03-12 DIAGNOSIS — I5022 Chronic systolic (congestive) heart failure: Secondary | ICD-10-CM

## 2017-03-12 DIAGNOSIS — Z833 Family history of diabetes mellitus: Secondary | ICD-10-CM

## 2017-03-12 LAB — HEPATIC FUNCTION PANEL
ALBUMIN: 3.5 g/dL (ref 3.5–5.0)
ALT: 42 U/L (ref 17–63)
AST: 36 U/L (ref 15–41)
Alkaline Phosphatase: 95 U/L (ref 38–126)
Bilirubin, Direct: <0.1 mg/dL — ABNORMAL LOW (ref 0.1–0.5)
TOTAL PROTEIN: 7.8 g/dL (ref 6.5–8.1)
Total Bilirubin: 0.5 mg/dL (ref 0.3–1.2)

## 2017-03-12 LAB — BASIC METABOLIC PANEL
ANION GAP: 13 (ref 5–15)
BUN: 11 mg/dL (ref 6–20)
CALCIUM: 8.8 mg/dL — AB (ref 8.9–10.3)
CO2: 25 mmol/L (ref 22–32)
Chloride: 91 mmol/L — ABNORMAL LOW (ref 101–111)
Creatinine, Ser: 1.04 mg/dL (ref 0.61–1.24)
GFR calc Af Amer: >60 mL/min (ref >60)
GLUCOSE: 490 mg/dL — AB (ref 65–99)
Potassium: 4.5 mmol/L (ref 3.5–5.1)
Sodium: 129 mmol/L — ABNORMAL LOW (ref 135–145)

## 2017-03-12 LAB — I-STAT ARTERIAL BLOOD GAS, ED
Acid-Base Excess: 7 mmol/L — ABNORMAL HIGH (ref 0.0–2.0)
Bicarbonate: 31.4 mmol/L — ABNORMAL HIGH (ref 20.0–28.0)
O2 SAT: 97 %
PCO2 ART: 42.5 mmHg (ref 32.0–48.0)
Patient temperature: 98.2
TCO2: 33 mmol/L — AB (ref 22–32)
pH, Arterial: 7.476 — ABNORMAL HIGH (ref 7.350–7.450)
pO2, Arterial: 82 mmHg — ABNORMAL LOW (ref 83.0–108.0)

## 2017-03-12 LAB — I-STAT TROPONIN, ED: TROPONIN I, POC: 0.09 ng/mL — AB (ref 0.00–0.08)

## 2017-03-12 LAB — I-STAT CG4 LACTIC ACID, ED
LACTIC ACID, VENOUS: 3.15 mmol/L — AB (ref 0.5–1.9)
LACTIC ACID, VENOUS: 1.61 mmol/L (ref 0.5–1.9)

## 2017-03-12 LAB — CBC
HCT: 40.9 % (ref 39.0–52.0)
HEMOGLOBIN: 14.1 g/dL (ref 13.0–17.0)
MCH: 33.7 pg (ref 26.0–34.0)
MCHC: 34.5 g/dL (ref 30.0–36.0)
MCV: 97.8 fL (ref 78.0–100.0)
PLATELETS: 147 10*3/uL — AB (ref 150–400)
RBC: 4.18 MIL/uL — ABNORMAL LOW (ref 4.22–5.81)
RDW: 13.2 % (ref 11.5–15.5)
WBC: 6.3 10*3/uL (ref 4.0–10.5)

## 2017-03-12 LAB — BRAIN NATRIURETIC PEPTIDE: B Natriuretic Peptide: 350.8 pg/mL — ABNORMAL HIGH (ref 0.0–100.0)

## 2017-03-12 LAB — TROPONIN I: Troponin I: 0.09 ng/mL (ref <0.03)

## 2017-03-12 MED ORDER — SODIUM CHLORIDE 0.9% FLUSH: 3.0000 mL | INTRAVENOUS | Status: DC | PRN

## 2017-03-12 MED ORDER — PREDNISONE 20 MG PO TABS
40.0000 mg | ORAL_TABLET | Freq: Once | ORAL | Status: AC
  Administered 2017-03-12: 40 mg via ORAL
  Filled 2017-03-12: qty 2

## 2017-03-12 MED ORDER — GUAIFENESIN ER 600 MG PO TB12
600.0000 mg | ORAL_TABLET | Freq: Two times a day (BID) | ORAL | Status: DC
  Administered 2017-03-12 – 2017-03-15 (×6): 600 mg via ORAL
  Filled 2017-03-12 (×6): qty 1

## 2017-03-12 MED ORDER — FUROSEMIDE 10 MG/ML IJ SOLN
60.0000 mg | Freq: Once | INTRAMUSCULAR | Status: AC
  Administered 2017-03-12: 60 mg via INTRAVENOUS
  Filled 2017-03-12: qty 6

## 2017-03-12 MED ORDER — DOLUTEGRAVIR SODIUM 50 MG PO TABS
50.0000 mg | ORAL_TABLET | Freq: Every day | ORAL | Status: DC
  Administered 2017-03-12 – 2017-03-20 (×9): 50 mg via ORAL
  Filled 2017-03-12 (×11): qty 1

## 2017-03-12 MED ORDER — ISOSORBIDE MONONITRATE ER 60 MG PO TB24
60.0000 mg | ORAL_TABLET | Freq: Every day | ORAL | Status: DC
  Administered 2017-03-12 – 2017-03-20 (×9): 60 mg via ORAL
  Filled 2017-03-12 (×2): qty 1
  Filled 2017-03-12: qty 2
  Filled 2017-03-12: qty 1
  Filled 2017-03-12: qty 2
  Filled 2017-03-12 (×4): qty 1

## 2017-03-12 MED ORDER — ASPIRIN EC 81 MG PO TBEC
81.0000 mg | DELAYED_RELEASE_TABLET | Freq: Every day | ORAL | Status: DC
  Administered 2017-03-13 – 2017-03-20 (×8): 81 mg via ORAL
  Filled 2017-03-12 (×8): qty 1

## 2017-03-12 MED ORDER — INSULIN ASPART 100 UNIT/ML ~~LOC~~ SOLN: 0.0000 [IU] | Freq: Three times a day (TID) | SUBCUTANEOUS | Status: DC

## 2017-03-12 MED ORDER — ASPIRIN 81 MG PO TBEC: 81.0000 mg | DELAYED_RELEASE_TABLET | Freq: Every day | ORAL | Status: DC

## 2017-03-12 MED ORDER — HEPARIN SODIUM (PORCINE) 5000 UNIT/ML IJ SOLN
5000.0000 [IU] | Freq: Three times a day (TID) | INTRAMUSCULAR | Status: DC
  Administered 2017-03-12 – 2017-03-18 (×13): 5000 [IU] via SUBCUTANEOUS
  Filled 2017-03-12 (×13): qty 1

## 2017-03-12 MED ORDER — LEVOFLOXACIN IN D5W 750 MG/150ML IV SOLN
750.0000 mg | INTRAVENOUS | Status: DC
  Administered 2017-03-12 – 2017-03-14 (×3): 750 mg via INTRAVENOUS
  Filled 2017-03-12 (×4): qty 150

## 2017-03-12 MED ORDER — FUROSEMIDE 20 MG PO TABS: 40.0000 mg | ORAL_TABLET | ORAL | Status: DC

## 2017-03-12 MED ORDER — PREDNISONE 20 MG PO TABS
40.0000 mg | ORAL_TABLET | Freq: Two times a day (BID) | ORAL | Status: DC
  Administered 2017-03-12 – 2017-03-17 (×11): 40 mg via ORAL
  Filled 2017-03-12 (×11): qty 2

## 2017-03-12 MED ORDER — NITROGLYCERIN 2 % TD OINT
1.0000 [in_us] | TOPICAL_OINTMENT | Freq: Once | TRANSDERMAL | Status: AC
  Administered 2017-03-12: 1 [in_us] via TOPICAL
  Filled 2017-03-12: qty 1

## 2017-03-12 MED ORDER — TRAZODONE HCL 50 MG PO TABS: 50.0000 mg | ORAL_TABLET | Freq: Every evening | ORAL | Status: DC | PRN

## 2017-03-12 MED ORDER — INSULIN ASPART PROT & ASPART (70-30 MIX) 100 UNIT/ML ~~LOC~~ SUSP: 3.0000 [IU] | Freq: Two times a day (BID) | SUBCUTANEOUS | Status: DC

## 2017-03-12 MED ORDER — HYDRALAZINE HCL 50 MG PO TABS
100.0000 mg | ORAL_TABLET | Freq: Three times a day (TID) | ORAL | Status: DC
  Administered 2017-03-12 – 2017-03-20 (×24): 100 mg via ORAL
  Filled 2017-03-12 (×14): qty 2
  Filled 2017-03-12: qty 4
  Filled 2017-03-12 (×6): qty 2
  Filled 2017-03-12 (×2): qty 4
  Filled 2017-03-12: qty 2

## 2017-03-12 MED ORDER — SULFAMETHOXAZOLE-TRIMETHOPRIM 400-80 MG/5ML IV SOLN
5.0000 mg/kg | Freq: Once | INTRAVENOUS | Status: AC
  Administered 2017-03-12: 452.8 mg via INTRAVENOUS
  Filled 2017-03-12: qty 28.3

## 2017-03-12 MED ORDER — EMTRICITABINE-TENOFOVIR AF 200-25 MG PO TABS
1.0000 | ORAL_TABLET | Freq: Every day | ORAL | Status: DC
  Administered 2017-03-12 – 2017-03-20 (×9): 1 via ORAL
  Filled 2017-03-12 (×10): qty 1

## 2017-03-12 MED ORDER — IPRATROPIUM-ALBUTEROL 0.5-2.5 (3) MG/3ML IN SOLN
3.0000 mL | Freq: Once | RESPIRATORY_TRACT | Status: AC
  Administered 2017-03-12: 3 mL via RESPIRATORY_TRACT
  Filled 2017-03-12: qty 3

## 2017-03-12 MED ORDER — ACETAMINOPHEN 325 MG PO TABS: 650.0000 mg | ORAL_TABLET | Freq: Four times a day (QID) | ORAL | Status: DC | PRN

## 2017-03-12 MED ORDER — HYDROCODONE-ACETAMINOPHEN 5-325 MG PO TABS
1.0000 | ORAL_TABLET | ORAL | Status: DC | PRN
  Administered 2017-03-16 – 2017-03-17 (×2): 1 via ORAL
  Administered 2017-03-17: 2 via ORAL
  Filled 2017-03-12: qty 2
  Filled 2017-03-12 (×2): qty 1

## 2017-03-12 MED ORDER — ALBUTEROL SULFATE (2.5 MG/3ML) 0.083% IN NEBU: 2.5000 mg | INHALATION_SOLUTION | RESPIRATORY_TRACT | Status: DC | PRN

## 2017-03-12 MED ORDER — ONDANSETRON HCL 4 MG/2ML IJ SOLN: 4.0000 mg | Freq: Four times a day (QID) | INTRAMUSCULAR | Status: DC | PRN

## 2017-03-12 MED ORDER — SODIUM CHLORIDE 0.9 % IV SOLN: 250.0000 mL | INTRAVENOUS | Status: DC | PRN

## 2017-03-12 MED ORDER — SENNA 8.6 MG PO TABS
1.0000 | ORAL_TABLET | Freq: Two times a day (BID) | ORAL | Status: DC
  Administered 2017-03-13 – 2017-03-20 (×14): 8.6 mg via ORAL
  Filled 2017-03-12 (×14): qty 1

## 2017-03-12 MED ORDER — ACETAMINOPHEN 650 MG RE SUPP: 650.0000 mg | Freq: Four times a day (QID) | RECTAL | Status: DC | PRN

## 2017-03-12 MED ORDER — MORPHINE SULFATE (PF) 4 MG/ML IV SOLN
4.0000 mg | Freq: Once | INTRAVENOUS | Status: AC
  Administered 2017-03-12: 4 mg via INTRAVENOUS
  Filled 2017-03-12: qty 1

## 2017-03-12 MED ORDER — IPRATROPIUM-ALBUTEROL 0.5-2.5 (3) MG/3ML IN SOLN
3.0000 mL | Freq: Four times a day (QID) | RESPIRATORY_TRACT | Status: DC
  Administered 2017-03-12: 3 mL via RESPIRATORY_TRACT
  Filled 2017-03-12: qty 3

## 2017-03-12 MED ORDER — SODIUM CHLORIDE 0.9% FLUSH
3.0000 mL | Freq: Two times a day (BID) | INTRAVENOUS | Status: DC
  Administered 2017-03-12 – 2017-03-19 (×12): 3 mL via INTRAVENOUS

## 2017-03-12 MED ORDER — CARVEDILOL 25 MG PO TABS
25.0000 mg | ORAL_TABLET | Freq: Two times a day (BID) | ORAL | Status: DC
  Administered 2017-03-12 – 2017-03-20 (×16): 25 mg via ORAL
  Filled 2017-03-12 (×8): qty 1
  Filled 2017-03-12: qty 2
  Filled 2017-03-12 (×3): qty 1
  Filled 2017-03-12: qty 2
  Filled 2017-03-12 (×3): qty 1

## 2017-03-12 MED ORDER — ONDANSETRON HCL 4 MG PO TABS: 4.0000 mg | ORAL_TABLET | Freq: Four times a day (QID) | ORAL | Status: DC | PRN

## 2017-03-12 MED ORDER — ATORVASTATIN CALCIUM 20 MG PO TABS
20.0000 mg | ORAL_TABLET | Freq: Every day | ORAL | Status: DC
  Administered 2017-03-12 – 2017-03-20 (×9): 20 mg via ORAL
  Filled 2017-03-12 (×3): qty 1
  Filled 2017-03-12: qty 2
  Filled 2017-03-12 (×6): qty 1

## 2017-03-12 MED ORDER — ASPIRIN 81 MG PO CHEW
324.0000 mg | CHEWABLE_TABLET | Freq: Once | ORAL | Status: AC
  Administered 2017-03-12: 324 mg via ORAL
  Filled 2017-03-12: qty 4

## 2017-03-12 MED ORDER — GABAPENTIN 300 MG PO CAPS: 300.0000 mg | ORAL_CAPSULE | Freq: Two times a day (BID) | ORAL | Status: DC

## 2017-03-12 MED ORDER — HYDRALAZINE HCL 20 MG/ML IJ SOLN
10.0000 mg | Freq: Once | INTRAMUSCULAR | Status: AC
  Administered 2017-03-12: 10 mg via INTRAVENOUS
  Filled 2017-03-12: qty 1

## 2017-03-12 MED ORDER — IOPAMIDOL (ISOVUE-370) INJECTION 76%
INTRAVENOUS | Status: AC
  Administered 2017-03-12: 100 mL
  Filled 2017-03-12: qty 100

## 2017-03-12 MED ORDER — SACUBITRIL-VALSARTAN 49-51 MG PO TABS: 1.0000 | ORAL_TABLET | Freq: Two times a day (BID) | ORAL | Status: DC

## 2017-03-12 MED ORDER — SODIUM CHLORIDE 0.9 % IV BOLUS (SEPSIS)
1000.0000 mL | Freq: Once | INTRAVENOUS | Status: AC
  Administered 2017-03-12: 1000 mL via INTRAVENOUS

## 2017-03-12 MED ORDER — SULFAMETHOXAZOLE-TRIMETHOPRIM 400-80 MG/5ML IV SOLN
15.0000 mg/kg/d | Freq: Three times a day (TID) | INTRAVENOUS | Status: DC
  Administered 2017-03-13 – 2017-03-15 (×7): 452.8 mg via INTRAVENOUS
  Filled 2017-03-12 (×9): qty 28.3

## 2017-03-12 MED ORDER — POLYETHYLENE GLYCOL 3350 17 G PO PACK
17.0000 g | PACK | Freq: Every day | ORAL | Status: DC | PRN
  Administered 2017-03-14: 17 g via ORAL
  Filled 2017-03-12: qty 1

## 2017-03-12 MED ORDER — LABETALOL HCL 5 MG/ML IV SOLN
10.0000 mg | Freq: Once | INTRAVENOUS | Status: AC
  Administered 2017-03-12: 10 mg via INTRAVENOUS
  Filled 2017-03-12: qty 4

## 2017-03-12 MED ORDER — MORPHINE SULFATE (PF) 4 MG/ML IV SOLN
3.0000 mg | INTRAVENOUS | Status: DC | PRN
  Administered 2017-03-12: 3 mg via INTRAVENOUS
  Filled 2017-03-12: qty 1

## 2017-03-12 NOTE — ED Notes (Signed)
Notified Dennie Maizes RN Psychologist, occupational) of elevated I stat troponin results

## 2017-03-12 NOTE — ED Provider Notes (Signed)
Care assumed from PA Pisciotta at shift change, please see her not for full details, but in brief Shaun Brennan is a 41 y.o. male with a history of CHF (EF 40% 2017), HIV (last CD4 410)and non-insulin dependent diabetes, who presents with worsening shortness of breath over the last 2 days. Associated pleuritic chest pain 8/10, nonexertional, no fevers, chills or hemoptysis. Non-compliance with lasix and low-sodium diet for the past week. New orthopnea, but no PND or increasing peripheral edema.   In ED pt noted to be diaphoretic, tachypneic and tachycardiac with mild hypoxia to high 80s. Increased respiratory effort, but lungs CTA bilat with good air movement. Pt placed on 2L [place] with improvement. ED workup significant for Troponin of 0.09, EKG with LBBB, no other significant changes, negative scarbossa criteria. CXR w/o significant signs of volume overload, BNP pending. Hyponatremia of 129, Lactic elevated to 3.15. Pt given 60 IV lasix, hydralazine, ASA and nitroglycerin in ED. CTPA ordered to r/o PE and get better look at lungs given granulomas present on CXR. Will follow up on study results and reevaluate pt's pain after medications have been administered.   Clinical Course as of Mar 12 1645  Mon Mar 12, 2017  1603 CT Angio Chest PE W and/or Wo Contrast [KF]  1616 CT shows no PE, multifocal pneumonia presents, given HIV status cannot rule out PCP pneumonia  [KF]  1626 Consulted ID for ABX recommendations, spoke with Dr. Enedina Finner  [KF]    Clinical Course User Index [KF] Dartha Lodge, PA-C    CT shows no PE, multifocal pneumonia concerning for PCP. Consulted ID for antibiotics recommendations, Spoke with Dr. Enedina Finner who recommends starting IV bactrim as well as prednisone. Recommends getting CD4, HIV RNA, and ABG. They will see patient in ED and follow along.  Positive troponin likely due to demand from hypertension and infection, additional labetalol given to reduce BP. Antibiotics and  prednisone started in ED. BNP is 350.8, so CHF could likely be contributing as well. Good urinary output since 60 IV Lasix  Pt will need to be admitted for Pneumonia with new oxygen requirement, 2L [place], consult placed to hospitalists. Spoke with Dr. Shon Hale who will see and admit the patient.  Patient discussed with Dr. Rubin Payor, who saw patient as well and agrees with plan.               Dartha Lodge, PA-C 03/12/17 1849    Benjiman Core, MD 03/12/17 2350

## 2017-03-12 NOTE — ED Notes (Signed)
Notified pharmacy again for [place]

## 2017-03-12 NOTE — ED Notes (Signed)
Dr. Patria Mane notified of I stat lactic acid results by B. Bing Plume, EMT

## 2017-03-12 NOTE — ED Notes (Signed)
RT notified of need for ABG.

## 2017-03-12 NOTE — H&P (Signed)
Patient Demographics:    Shaun Brennan, is a 41 y.o. male  MRN: [id number]   DOB - 1975/11/18  Admit Date - 03/12/2017  Outpatient Primary MD for the patient is Patient, No Pcp Per   Assessment & Plan:    Principal Problem:   Acute respiratory distress Active Problems:   DM2 (diabetes mellitus, type 2) (HCC)   HIV (human immunodeficiency virus infection) ([place])   HTN (hypertension)   Chronic systolic heart failure (HCC)   Pneumonia   Alcohol abuse with alcohol-induced mood disorder ([place])   1)Acute Hypoxic Resp Failure-due to pneumonia in a patient with HIV positive status, discussed with infectious disease team, treat empirically with IV Levaquin, IV Bactrim and prednisone to cover for possible PCP pneumonia pending cultures.  PCP sputum smear pending. No leukocytosis however patient has fevers and diaphoresis. Elevated lactic acid noted, most likely due to respiratory distress rather than Frank sepsis.  CTA chest suggested pneumonia possibly PCP versus atypical pneumonia, continue mucolytics and bronchodilators, continue oxygen, ABG pending.  There is some concerns about possible noncompliance with HIV meds recently.     2)HiV-viral load was previously undetectable and  CD4 count was 410 in March 2018,  repeat CD4 count and HIV RNA and genotype pending , infectious disease consult appreciated, continue Tivicay + Descovy. There is some concerns about possible noncompliance with HIV meds recently  3)HFrEF-last known EF around 40%, patient voided 1 L after IV Lasix in the ED, BNP  Is 350, which is below his usual baseline, monitor daily fluid input and output, daily weight, echocardiogram ordered and pending.  Continue Coreg, isosorbide/hydralazine combo aspirin  4)DM-anticipate worsening glycemic control due to steroids, continue mealtime insulin along with sliding scale coverage, A1c pending  With History of - Reviewed by me  Past Medical History:  Diagnosis Date  . CHF  (congestive heart failure) ([place])   . Chronic systolic heart failure (HCC)    a. EF 15-20%, grade II DD, LA mild/mod dilated  . Diabetes mellitus without complication ([place])   . DM2 (diabetes mellitus, type 2) ([place])   . HIV (human immunodeficiency virus infection) ([place])   . Hypertension       Past Surgical History:  Procedure Laterality Date  . ANKLE SURGERY Right     Chief Complaint  Patient presents with  . Shortness of Breath      HPI:    Shaun Brennan  is a 41 y.o. male with past medical history relevant for HIV positive status, diabetes, reduced EF CHF and hypertension who presents with productive cough, chills and diaphoresis, shortness of breath, and chest pain associated with cough for the last couple of days.  Symptoms worsened over the last 24 hours. There is some concerns about possible noncompliance with HIV meds recently.  Previously patient's HIV viral load has been undetectable and his CD4 count has been over 400.  No vomiting or diarrhea.  No sick contacts at home and no recent travel  Patient same sex partner/significant other is at bedside, questions answered  In ED.... Patient received IV Lasix from ED provider, voided about 1 L of urine,  Remained hypoxic, CTA chest without PE, however shows multifocal pneumonia with concerns of a possible PCP or atypical pneumonia   Patient was seen by me in the ED in conjunction with the ID team, prednisone added.  ABG pending     Review of systems:    In addition to the HPI above,   A full 12 point Review  of 10 Systems was done, except as stated above, all other Review of 10 Systems were negative.    Social History:  Reviewed by me    Social History   Tobacco Use  . Smoking status: Current Every Day Smoker    Packs/day: 0.25    Years: 15.00    Pack years: 3.75    Types: Cigarettes  . Smokeless tobacco: Never Used  . Tobacco comment: about 10 cigarettes a week  Substance Use Topics  . Alcohol use: Yes     Alcohol/week: 9.0 oz    Types: 12 Cans of beer, 3 Shots of liquor per week       Family History :  Reviewed by me    Family History  Problem Relation Age of Onset  . Other Father   . Diabetes Mother   . Stroke Paternal Grandfather      Home Medications:   Prior to Admission medications   Medication Sig Start Date End Date Taking? Authorizing Provider  aspirin 81 MG EC tablet Take 1 tablet (81 mg total) by mouth daily. Patient taking differently: Take 81 mg by mouth daily as needed for pain.  06/16/16  Yes Dhungel, Nishant, MD  atorvastatin (LIPITOR) 20 MG tablet TAKE 1 TABLET BY MOUTH ONCE DAILY Patient taking differently: TAKE '20MG'$  BY MOUTH ONCE DAILY 01/25/17  Yes Bensimhon, Shaune Pascal, MD  blood glucose meter kit and supplies KIT Dispense based on patient and insurance preference. Use up to four times daily as directed. (FOR ICD-9 250.00, 250.01). 06/16/16  Yes Dhungel, Nishant, MD  carvedilol (COREG) 25 MG tablet TAKE 1 TABLET BY MOUTH TWICE DAILY Patient taking differently: TAKE '25MG'$  BY MOUTH TWICE DAILY WITH MEALS 01/25/17  Yes Bensimhon, Shaune Pascal, MD  DESCOVY 200-25 MG tablet TAKE 1 TABLET BY MOUTH EVERY DAY 06/01/16  Yes Campbell Riches, MD  hydrALAZINE (APRESOLINE) 50 MG tablet TAKE 2 TABLETS BY MOUTH 2 TIMES A DAY Patient taking differently: TAKE '100MG'$  BY MOUTH TWICE DAILY 01/25/17  Yes Bensimhon, Shaune Pascal, MD  ibuprofen (ADVIL,MOTRIN) 200 MG tablet Take 200 mg by mouth every 6 (six) hours as needed for moderate pain.   Yes [provider]  isosorbide mononitrate (IMDUR) 60 MG 24 hr tablet TAKE 1 TABLET BY MOUTH ONCE DAILY Patient taking differently: TAKE '60MG'$  BY MOUTH ONCE DAILY 01/25/17  Yes Bensimhon, Shaune Pascal, MD  TIVICAY 50 MG tablet TAKE 1 TABLET BY MOUTH EVERY DAY Patient taking differently: TAKE '50MG'$  BY MOUTH ONCE DAILY 06/01/16  Yes Campbell Riches, MD  cefdinir (OMNICEF) 300 MG capsule Take 1 capsule (300 mg total) by mouth 2 (two) times daily. Stop  taking 6/30 Patient not taking: Reported on 01/06/2017 10/05/16   Ledell Noss, MD  furosemide (LASIX) 40 MG tablet Take 1 tablet (40 mg total) by mouth 2 (two) times a week. On Monday and Friday Patient not taking: Reported on 03/12/2017 04/27/16   Darrick Grinder D, NP  gabapentin (NEURONTIN) 300 MG capsule Take 1 capsule (300 mg total) by mouth 2 (two) times daily. Patient not taking: Reported on 03/12/2017 01/08/17   Ethelene Hal, NP  insulin NPH-regular Human (NOVOLIN 70/30) (70-30) 100 UNIT/ML injection Inject 15 Units into the skin 2 (two) times daily with a meal. Patient not taking: Reported on 10/03/2016 06/16/16   Dhungel, Flonnie Overman, MD  metFORMIN (GLUCOPHAGE) 500 MG tablet Take 2 tablets (1,000 mg total) by mouth 2 (two) times daily with a meal. Patient not taking: Reported on 03/12/2017 06/16/16  Dhungel, Nishant, MD  sacubitril-valsartan (ENTRESTO) 49-51 MG Take 1 tablet by mouth 2 (two) times daily. Patient not taking: Reported on 03/12/2017 04/28/16   Bensimhon, Shaune Pascal, MD     Allergies:     Allergies  Allergen Reactions  . Lisinopril Cough     Physical Exam:   Vitals  Blood pressure (!) 164/110, pulse 96, temperature 98.2 F (36.8 C), temperature source Oral, resp. rate (!) 24, height '5\' 5"'$  (1.651 m), weight 90.7 kg (200 lb), SpO2 93 %.  Physical Examination: General appearance - alert, respiratory distress, increased work of breathing and diaphoresis Mental status - alert, oriented to person, place, and time, answers, Eyes - sclera anicteric Neck - supple, no JVD elevation , Chest -diminished breath sounds bilaterally, scattered rales/crackles in the right lung base, wheezes and upper lung fields Heart - S1 and S2 normal,  Abdomen - soft, nontender, nondistended, no CVA tenderness  neurological - screening mental status exam normal, neck supple without rigidity, cranial nerves II through XII intact, DTR's normal and symmetric Extremities - no pedal edema noted, intact  peripheral pulses  Skin - warm, dry Pscyh-anxious affect   Data Review:    CBC Recent Labs  Lab 03/12/17 1327  WBC 6.3  HGB 14.1  HCT 40.9  PLT 147*  MCV 97.8  MCH 33.7  MCHC 34.5  RDW 13.2   ------------------------------------------------------------------------------------------------------------------  Chemistries  Recent Labs  Lab 03/12/17 1327  NA 129*  K 4.5  CL 91*  CO2 25  GLUCOSE 490*  BUN 11  CREATININE 1.04  CALCIUM 8.8*   ------------------------------------------------------------------------------------------------------------------ estimated creatinine clearance is 97.8 mL/min (by C-G formula based on SCr of 1.04 mg/dL). ------------------------------------------------------------------------------------------------------------------ No results for input(s): TSH, T4TOTAL, T3FREE, THYROIDAB in the last 72 hours.  Invalid input(s): FREET3   Coagulation profile No results for input(s): INR, PROTIME in the last 168 hours. ------------------------------------------------------------------------------------------------------------------- No results for input(s): DDIMER in the last 72 hours. -------------------------------------------------------------------------------------------------------------------  Cardiac Enzymes No results for input(s): CKMB, TROPONINI, MYOGLOBIN in the last 168 hours.  Invalid input(s): CK ------------------------------------------------------------------------------------------------------------------    Component Value Date/Time   BNP 350.8 (H) 03/12/2017 1327     ---------------------------------------------------------------------------------------------------------------  Urinalysis    Component Value Date/Time   COLORURINE YELLOW 10/03/2016 1234   APPEARANCEUR CLEAR 10/03/2016 1234   LABSPEC 1.030 10/03/2016 1234   PHURINE 5.0 10/03/2016 1234   GLUCOSEU >=500 (A) 10/03/2016 1234   HGBUR SMALL (A)  10/03/2016 1234   BILIRUBINUR NEGATIVE 10/03/2016 1234   KETONESUR NEGATIVE 10/03/2016 1234   PROTEINUR 100 (A) 10/03/2016 1234   UROBILINOGEN 0.2 12/20/2013 1321   NITRITE NEGATIVE 10/03/2016 1234   LEUKOCYTESUR NEGATIVE 10/03/2016 1234    ----------------------------------------------------------------------------------------------------------------   Imaging Results:    Dg Chest 2 View  Result Date: 03/12/2017 CLINICAL DATA:  Shortness of breath and cough EXAM: CHEST  2 VIEW COMPARISON:  January 06, 2017 FINDINGS: There are multiple calcified granulomas scattered throughout the lungs. There is no edema or consolidation. Heart is borderline prominent with pulmonary vascularity within normal limits. No adenopathy. No evident bone lesions. IMPRESSION: Multiple scattered calcified granulomas. No edema or consolidation. Heart remains borderline prominent. No adenopathy evident. Electronically Signed   By: Lowella Grip III M.D.   On: 03/12/2017 14:02   Ct Angio Chest Pe W And/or Wo Contrast  Result Date: 03/12/2017 CLINICAL DATA:  Chest pain and shortness of breath. History of HIV disease. EXAM: CT ANGIOGRAPHY CHEST WITH CONTRAST TECHNIQUE: Multidetector CT imaging of the chest was performed using the standard  protocol during bolus administration of intravenous contrast. Multiplanar CT image reconstructions and MIPs were obtained to evaluate the vascular anatomy. CONTRAST:  142m ISOVUE-370 IOPAMIDOL (ISOVUE-370) INJECTION 76% COMPARISON:  Chest CT June 11, 2016; Chest CT October 03, 2016; chest radiograph March 12, 2017 FINDINGS: Cardiovascular: There is no demonstrable pulmonary embolus. Visualized great vessels appear unremarkable. No thoracic aortic aneurysm or dissection. Pericardium is not appreciably thickened. There is mild left ventricular hypertrophy. The main pulmonary outflow tract measures 3.4 cm, an abnormal value concerning for underlying pulmonary arterial hypertension.  Mediastinum/Nodes: Thyroid appears unremarkable. There is a stable lymph node anterior to the left main bronchus measuring 1.7 x 1.1 cm. There is a stable sub- carinal lymph node measuring 2.1 x 1.4 cm. There is a stable lymph node in the right high the measuring 1.2 x 1.1 cm. No new lymph node prominence is evident. There is no appreciable esophageal lesion. Lungs/Pleura: There is widespread patchy airspace opacity, most notably throughout the left lower lobe and lingula but also present at several sites throughout the right lung. There are multiple scattered calcified granulomas. There is a small right pleural effusion. Upper Abdomen: There is reflux of contrast into the inferior vena cava and hepatic veins. Visualized upper abdominal structures otherwise appear unremarkable. Musculoskeletal: There are no blastic or lytic bone lesions. Review of the MIP images confirms the above findings. IMPRESSION: 1.  No demonstrable pulmonary embolus. 2. Multifocal airspace opacification, likely multifocal pneumonia. Given history of HIV disease, Pneumocystis must be of particular concern. Allergic type phenomenon could present in this manner. Both infectious etiology an allergic type phenomenon may present concurrently. 3.  Small right pleural effusion. 4. Evidence of prior granulomatous disease with multiple calcified granulomas noted. 5.  Stable areas of adenopathy.  No new adenopathy evident. 6. Enlargement of the main pulmonary outflow tract is felt to be indicative of a degree of underlying pulmonary arterial hypertension. 7. Reflux of contrast into the inferior vena cava and hepatic veins likely is indicative of a degree of increase in right heart pressure. 8.  There is a degree of left ventricular hypertrophy. Electronically Signed   By: WLowella GripIII M.D.   On: 03/12/2017 15:59    Radiological Exams on Admission: Dg Chest 2 View  Result Date: 03/12/2017 CLINICAL DATA:  Shortness of breath and cough EXAM:  CHEST  2 VIEW COMPARISON:  January 06, 2017 FINDINGS: There are multiple calcified granulomas scattered throughout the lungs. There is no edema or consolidation. Heart is borderline prominent with pulmonary vascularity within normal limits. No adenopathy. No evident bone lesions. IMPRESSION: Multiple scattered calcified granulomas. No edema or consolidation. Heart remains borderline prominent. No adenopathy evident. Electronically Signed   By: WLowella GripIII M.D.   On: 03/12/2017 14:02   Ct Angio Chest Pe W And/or Wo Contrast  Result Date: 03/12/2017 CLINICAL DATA:  Chest pain and shortness of breath. History of HIV disease. EXAM: CT ANGIOGRAPHY CHEST WITH CONTRAST TECHNIQUE: Multidetector CT imaging of the chest was performed using the standard protocol during bolus administration of intravenous contrast. Multiplanar CT image reconstructions and MIPs were obtained to evaluate the vascular anatomy. CONTRAST:  1058mISOVUE-370 IOPAMIDOL (ISOVUE-370) INJECTION 76% COMPARISON:  Chest CT June 11, 2016; Chest CT October 03, 2016; chest radiograph March 12, 2017 FINDINGS: Cardiovascular: There is no demonstrable pulmonary embolus. Visualized great vessels appear unremarkable. No thoracic aortic aneurysm or dissection. Pericardium is not appreciably thickened. There is mild left ventricular hypertrophy. The main pulmonary outflow tract measures 3.4  cm, an abnormal value concerning for underlying pulmonary arterial hypertension. Mediastinum/Nodes: Thyroid appears unremarkable. There is a stable lymph node anterior to the left main bronchus measuring 1.7 x 1.1 cm. There is a stable sub- carinal lymph node measuring 2.1 x 1.4 cm. There is a stable lymph node in the right high the measuring 1.2 x 1.1 cm. No new lymph node prominence is evident. There is no appreciable esophageal lesion. Lungs/Pleura: There is widespread patchy airspace opacity, most notably throughout the left lower lobe and lingula but also  present at several sites throughout the right lung. There are multiple scattered calcified granulomas. There is a small right pleural effusion. Upper Abdomen: There is reflux of contrast into the inferior vena cava and hepatic veins. Visualized upper abdominal structures otherwise appear unremarkable. Musculoskeletal: There are no blastic or lytic bone lesions. Review of the MIP images confirms the above findings. IMPRESSION: 1.  No demonstrable pulmonary embolus. 2. Multifocal airspace opacification, likely multifocal pneumonia. Given history of HIV disease, Pneumocystis must be of particular concern. Allergic type phenomenon could present in this manner. Both infectious etiology an allergic type phenomenon may present concurrently. 3.  Small right pleural effusion. 4. Evidence of prior granulomatous disease with multiple calcified granulomas noted. 5.  Stable areas of adenopathy.  No new adenopathy evident. 6. Enlargement of the main pulmonary outflow tract is felt to be indicative of a degree of underlying pulmonary arterial hypertension. 7. Reflux of contrast into the inferior vena cava and hepatic veins likely is indicative of a degree of increase in right heart pressure. 8.  There is a degree of left ventricular hypertrophy. Electronically Signed   By: Lowella Grip III M.D.   On: 03/12/2017 15:59    DVT Prophylaxis -SCD/Heparin AM Labs Ordered, also please review Full Orders  Family Communication: Admission, patients condition and plan of care including tests being ordered have been discussed with the patient and significant other who indicate understanding and agree with the plan   Code Status - Full Code  Likely DC to  Home   Condition   stable  Roxan Hockey M.D on 03/12/2017 at 5:39 PM   Between 7am to 7pm - Pager - [phone] After 7pm go to [email or web address] - password [email or web address]  [facility] - Office  [phone]  Voice Recognition Viviann Spare dictation system was used to create  this note, attempts have been made to correct errors. Please contact the author with questions and/or clarifications.

## 2017-03-12 NOTE — ED Notes (Signed)
Attempted to call primary RN for report, phone not working - informed Baird Lyons, Charity fundraiser

## 2017-03-12 NOTE — ED Notes (Signed)
Patient transported to CT 

## 2017-03-12 NOTE — ED Provider Notes (Signed)
[facility] EMERGENCY DEPARTMENT Provider Note   CSN: [id number] Arrival date & time: 03/12/17  1311     History   Chief Complaint Chief Complaint  Patient presents with  . Shortness of Breath    HPI   Blood pressure (!) 148/94, pulse (!) 115, temperature 98.2 F (36.8 C), temperature source Oral, resp. rate (!) 36, height '5\' 5"'$  (1.651 m), weight 90.7 kg (200 lb), SpO2 98 %.  Shaun Brennan is a 41 y.o. male with past medical history significant for CHF (EF 40% 01/2016) HIV (last CD4 count was 410 on 06/2016, non-insulin-dependent diabetes complaining of shortness of breath worsening over the course of the last 2 days.  He states it was so severe last night he could not sleep.  He has pleuritic pain with this with no fever, chills, hemoptysis.  He does have a productive cough he states that he has been noncompliant with his Lasix for 1 week and has not been weighing himself and has not been sticking to a low-sodium diet because of the holidays.  He denies any increasing peripheral edema and endorses orthopnea starting yesterday with no PND.  She has not been taking his Lasix for the last week but he states he has been compliant with all other medications including his HIV meds.  He does not think that he had a history of PCP pneumonia.  He is not on any chronic antibiotics for prophylaxis.  No history of DVT/PE, recent immobilizations, calf pain, leg swelling.  States he did not have any medications this morning but took his nighttime meds yesterday.  He reports an 8 out of stent 10 retrosternal chest pain that he describes as pleuritic, nonexertional. Does not take a daily ASA.   Past Medical History:  Diagnosis Date  . CHF (congestive heart failure) ([place])   . Chronic systolic heart failure (HCC)    a. EF 15-20%, grade II DD, LA mild/mod dilated  . Diabetes mellitus without complication ([place])   . DM2 (diabetes mellitus, type 2) ([place])   . HIV (human immunodeficiency  virus infection) ([place])   . Hypertension     Patient Active Problem List   Diagnosis Date Noted  . Alcohol abuse with alcohol-induced mood disorder ([place]) 01/06/2017  . Pneumonia 10/03/2016  . Onychomycosis 09/18/2016  . Diabetes mellitus due to underlying condition, uncontrolled, with hyperglycemia, without long-term current use of insulin ([place]) 06/16/2016  . Acute renal failure superimposed on chronic kidney disease, on chronic dialysis ([place])   . Sepsis ([place]) 06/11/2016  . Acute respiratory failure with hypoxia ([place]) 06/11/2016  . Controlled type 2 diabetes mellitus with hyperglycemia ([place]) 06/11/2016  . Cough   . SOB (shortness of breath)   . Shortness of breath 01/17/2016  . Accelerated hypertension   . Hyperlipidemia 11/10/2015  . Diabetic neuropathy ([place]) 11/10/2015  . History of ETOH abuse 09/08/2015  . Suicidal ideation 07/14/2014  . Major depression 07/14/2014  . Depression, major, single episode, moderate ([place]) 07/11/2014  . Community acquired pneumonia 07/07/2014  . Community acquired pneumonia of right lower lobe of lung ([place]) 07/07/2014  . HTN (hypertension) 12/17/2013  . Chronic systolic heart failure ([place]) 12/17/2013  . Diabetes mellitus due to underlying condition without complications ([place]) 93/26/7124  . Smoking 12/12/2013  . Acute on chronic systolic heart failure ([place]) 12/09/2013  . Acute respiratory distress 12/06/2013  . 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 DM2 (diabetes mellitus, type 2) ([place]) 12/06/2013  . HIV (human immunodeficiency virus infection) ([place]) 12/06/2013  .  H/O CHF 12/06/2013  . Cardiomegaly: per cxr 12/06/13 12/06/2013    Past Surgical History:  Procedure Laterality Date  . ANKLE SURGERY Right        Home Medications    Prior to Admission medications   Medication Sig Start Date End Date Taking? Authorizing Provider  aspirin 81 MG EC tablet Take 1 tablet (81 mg total) by mouth daily. 06/16/16   Dhungel, Nishant, MD  atorvastatin  (LIPITOR) 20 MG tablet TAKE 1 TABLET BY MOUTH ONCE DAILY 01/25/17   Bensimhon, Shaune Pascal, MD  blood glucose meter kit and supplies KIT Dispense based on patient and insurance preference. Use up to four times daily as directed. (FOR ICD-9 250.00, 250.01). 06/16/16   Dhungel, Nishant, MD  carvedilol (COREG) 25 MG tablet TAKE 1 TABLET BY MOUTH TWICE DAILY 01/25/17   Bensimhon, Shaune Pascal, MD  cefdinir (OMNICEF) 300 MG capsule Take 1 capsule (300 mg total) by mouth 2 (two) times daily. Stop taking 6/30 Patient not taking: Reported on 01/06/2017 10/05/16   Ledell Noss, MD  DESCOVY 200-25 MG tablet TAKE 1 TABLET BY MOUTH EVERY DAY 06/01/16   Campbell Riches, MD  furosemide (LASIX) 40 MG tablet Take 1 tablet (40 mg total) by mouth 2 (two) times a week. On Monday and Friday 04/27/16   Darrick Grinder D, NP  gabapentin (NEURONTIN) 300 MG capsule Take 1 capsule (300 mg total) by mouth 2 (two) times daily. 01/08/17   Ethelene Hal, NP  hydrALAZINE (APRESOLINE) 50 MG tablet TAKE 2 TABLETS BY MOUTH 2 TIMES A DAY 01/25/17   Bensimhon, Shaune Pascal, MD  insulin NPH-regular Human (NOVOLIN 70/30) (70-30) 100 UNIT/ML injection Inject 15 Units into the skin 2 (two) times daily with a meal. Patient not taking: Reported on 10/03/2016 06/16/16   Dhungel, Flonnie Overman, MD  isosorbide mononitrate (IMDUR) 60 MG 24 hr tablet TAKE 1 TABLET BY MOUTH ONCE DAILY 01/25/17   Bensimhon, Shaune Pascal, MD  metFORMIN (GLUCOPHAGE) 500 MG tablet Take 2 tablets (1,000 mg total) by mouth 2 (two) times daily with a meal. 06/16/16   Dhungel, Nishant, MD  sacubitril-valsartan (ENTRESTO) 49-51 MG Take 1 tablet by mouth 2 (two) times daily. 04/28/16   Bensimhon, Shaune Pascal, MD  TIVICAY 50 MG tablet TAKE 1 TABLET BY MOUTH EVERY DAY 06/01/16   Campbell Riches, MD    Family History Family History  Problem Relation Age of Onset  . Other Father   . Diabetes Mother   . Stroke Paternal Grandfather     Social History Social History   Tobacco Use  . Smoking  status: Current Every Day Smoker    Packs/day: 0.25    Years: 15.00    Pack years: 3.75    Types: Cigarettes  . Smokeless tobacco: Never Used  . Tobacco comment: about 10 cigarettes a week  Substance Use Topics  . Alcohol use: Yes    Alcohol/week: 9.0 oz    Types: 12 Cans of beer, 3 Shots of liquor per week  . Drug use: Yes    Types: Marijuana     Allergies   Lisinopril   Review of Systems Review of Systems  A complete review of systems was obtained and all systems are negative except as noted in the HPI and PMH.    Physical Exam Updated Vital Signs BP (!) 178/132   Pulse 96   Temp 98.2 F (36.8 C) (Oral)   Resp (!) 28   Ht '5\' 5"'$  (1.651 m)   Wt  90.7 kg (200 lb)   SpO2 93%   BMI 33.28 kg/m   Physical Exam  Constitutional: He is oriented to person, place, and time. He appears well-developed and well-nourished.  Diaphoretic, tachypneic, tachycardic  HENT:  Head: Normocephalic and atraumatic.  Mouth/Throat: Oropharynx is clear and moist.  Eyes: Conjunctivae and EOM are normal. Pupils are equal, round, and reactive to light.  Neck: Normal range of motion.  Cardiovascular: Normal rate and intact distal pulses.  Pulmonary/Chest: Breath sounds normal. No stridor. He has no wheezes. He has no rales.  Supraclavicular accessory muscle use, lung sounds clear, no wheezing excellent air movement, decreased at the bases, no crackles  Abdominal: Soft. There is no tenderness.  Musculoskeletal: Normal range of motion.  Neurological: He is alert and oriented to person, place, and time.  Skin: He is not diaphoretic.  Psychiatric: He has a normal mood and affect.  Nursing note and vitals reviewed.    ED Treatments / Results  Labs (all labs ordered are listed, but only abnormal results are displayed) Labs Reviewed  BASIC METABOLIC PANEL - Abnormal; Notable for the following components:      Result Value   Sodium 129 (*)    Chloride 91 (*)    Glucose, Bld 490 (*)     Calcium 8.8 (*)    All other components within normal limits  CBC - Abnormal; Notable for the following components:   RBC 4.18 (*)    Platelets 147 (*)    All other components within normal limits  I-STAT TROPONIN, ED - Abnormal; Notable for the following components:   Troponin i, poc 0.09 (*)    All other components within normal limits  I-STAT CG4 LACTIC ACID, ED - Abnormal; Notable for the following components:   Lactic Acid, Venous 3.15 (*)    All other components within normal limits  CULTURE, BLOOD (ROUTINE X 2)  CULTURE, BLOOD (ROUTINE X 2)  BRAIN NATRIURETIC PEPTIDE  HEPATIC FUNCTION PANEL  TROPONIN I    EKG  EKG Interpretation None       Radiology Dg Chest 2 View  Result Date: 03/12/2017 CLINICAL DATA:  Shortness of breath and cough EXAM: CHEST  2 VIEW COMPARISON:  January 06, 2017 FINDINGS: There are multiple calcified granulomas scattered throughout the lungs. There is no edema or consolidation. Heart is borderline prominent with pulmonary vascularity within normal limits. No adenopathy. No evident bone lesions. IMPRESSION: Multiple scattered calcified granulomas. No edema or consolidation. Heart remains borderline prominent. No adenopathy evident. Electronically Signed   By: Lowella Grip III M.D.   On: 03/12/2017 14:02    Procedures Procedures (including critical care time)  CRITICAL CARE Performed by: Monico Blitz   Total critical care time: 35 minutes  Critical care time was exclusive of separately billable procedures and treating other patients.  Critical care was necessary to treat or prevent imminent or life-threatening deterioration.  Critical care was time spent personally by me on the following activities: development of treatment plan with patient and/or surrogate as well as nursing, discussions with consultants, evaluation of patient's response to treatment, examination of patient, obtaining history from patient or surrogate, ordering and  performing treatments and interventions, ordering and review of laboratory studies, ordering and review of radiographic studies, pulse oximetry and re-evaluation of patient's condition.   Medications Ordered in ED Medications  nitroGLYCERIN (NITROGLYN) 2 % ointment 1 inch (not administered)  furosemide (LASIX) injection 60 mg (not administered)  iopamidol (ISOVUE-370) 76 % injection (not administered)  aspirin  chewable tablet 324 mg (324 mg Oral Given 03/12/17 1506)  hydrALAZINE (APRESOLINE) injection 10 mg (10 mg Intravenous Given 03/12/17 1508)     Initial Impression / Assessment and Plan / ED Course  I have reviewed the triage vital signs and the nursing notes.  Pertinent labs & imaging results that were available during my care of the patient were reviewed by me and considered in my medical decision making (see chart for details).     Vitals:   03/12/17 1320 03/12/17 1415 03/12/17 1430 03/12/17 1445  BP:  (!) 191/127 (!) 183/126 (!) 178/132  Pulse:  99 (!) 101 96  Resp:  18 (!) 22 (!) 28  Temp:      TempSrc:      SpO2:  96% 94% 93%  Weight: 90.7 kg (200 lb)     Height: '5\' 5"'$  (1.651 m)       Medications  nitroGLYCERIN (NITROGLYN) 2 % ointment 1 inch (not administered)  furosemide (LASIX) injection 60 mg (not administered)  iopamidol (ISOVUE-370) 76 % injection (not administered)  aspirin chewable tablet 324 mg (324 mg Oral Given 03/12/17 1506)  hydrALAZINE (APRESOLINE) injection 10 mg (10 mg Intravenous Given 03/12/17 1508)    Shaun Brennan is 41 y.o. male presenting with shortness of breath, pleuritic chest pain worsening over the course of the last several days.  Patient mildly hypoxic with O2 sats in the upper 80s low 90s without supplementation.  EKG with left bundle branch block.  No scar Bosa criteria.  Given patient's low troponin, likely demand.  Chest x-rays without signs of volume overload however will send BNP, since his been compliant with his Lasix will give him  60 IV.  CTA to rule out PE, atypical infection.  Patient given full dose aspirin, nitroglycerin and hydralazine for his elevated blood pressure.  Case signed out to [place] at shift change: Plan to follow-up CTA, recheck patient's pain after nitroglycerin is administered.  Final Clinical Impressions(s) / ED Diagnoses   Final diagnoses:  None    ED Discharge Orders    None       Karen Kays Charna Elizabeth 03/12/17 [place], MD 03/12/17 1553

## 2017-03-12 NOTE — Consult Note (Signed)
[facility]    Date of Admission:  03/12/2017      Total days of antibiotics 0               Reason for Consult: SOB in HIV (+)    Referring Provider: Ford/EDP Primary Care Provider: Mariea ClontsEmokpae  Assessment/Plan:  1. Shortness of Breath = acute presentation of symptoms quickly worsening over the last 2 days. Afebrile. Breath sounds with scattered wheezing and overall diminished with crackles at RLL.   With sx for only 2 days this seems less like PCP (which usually worsens over weeks).   Start bactrim, prednisone for now.  Somewhat better with diuresis in ED (I L out)  2. HIV = on Tivicay + Descovy. Previously with adequate CD4 count and undetectable with last check in Feb 2018.  Resume his prev art, recheck CD4 and HIV RNA and genotype  3. CHF = resume his lasix, watch his I/Os Could consider TTE  4. Hypertension = will defer to primary team.     HPI: Shaun Brennan is a 41 y.o. male with history of known HIV who presented to the ER for evaluation of his worsening SOB and pleuritic chest pain over the last 2 days. Denies fevers, chills or hemoptysis. States he does have a productive cough and had a difficult time sleeping last night. He also has a history of CHF requiring lasix 2x weekly and as needed - has not taken any over the last week. He is also on Entresto 49-51mg .    Hospital Course: BNP 350 today. WBC normal. Afebrile. Lactic acid 3.15. Chest CT with multifocal opacifications; small right pleural effusion, stable areas of mediastinal adenopathy, scattered calcified granulomas. DDX including PJP, other multifocal pneumonia, allergic, viral.   In discussion with him he describes the onset of this illness to have been sudden starting 2 days prior with significant worsening overnight. He tells me he was unable to get relief until he got up from his bed and slept in his recliner.   Active Problems:   * No active hospital problems. *   .  predniSONE  40 mg Oral Once    Review of Systems: Review of Systems  Constitutional: Positive for diaphoresis. Negative for chills and fever.  HENT: Negative for tinnitus.   Eyes: Negative for blurred vision and photophobia.  Respiratory: Positive for cough, sputum production, shortness of breath and wheezing.   Cardiovascular: Positive for chest pain.  Gastrointestinal: Negative for diarrhea, nausea and vomiting.  Genitourinary: Negative for dysuria.  Musculoskeletal: Negative for myalgias.  Skin: Negative for rash.  Neurological: Negative for dizziness and headaches.  Psychiatric/Behavioral: The patient has insomnia.   Please see HPI. All other systems reviewed and negative.   Past Medical History:  Diagnosis Date  . CHF (congestive heart failure) (HCC)   . Chronic systolic heart failure (HCC)    a. EF 15-20%, grade II DD, LA mild/mod dilated  . Diabetes mellitus without complication (HCC)   . DM2 (diabetes mellitus, type 2) (HCC)   . HIV (human immunodeficiency virus infection) (HCC)   . Hypertension     Social History   Tobacco Use  . Smoking status: Current Every Day Smoker    Packs/day: 0.25    Years: 15.00    Pack years: 3.75    Types: Cigarettes  . Smokeless tobacco: Never Used  . Tobacco comment: about 10 cigarettes a week  Substance Use Topics  . Alcohol  use: Yes    Alcohol/week: 9.0 oz    Types: 12 Cans of beer, 3 Shots of liquor per week  . Drug use: Yes    Types: Marijuana    Family History  Problem Relation Age of Onset  . Other Father   . Diabetes Mother   . Stroke Paternal Grandfather    Allergies  Allergen Reactions  . Lisinopril Cough    OBJECTIVE: Blood pressure (!) 178/132, pulse 96, temperature 98.2 F (36.8 C), temperature source Oral, resp. rate (!) 28, height 5\' 5"  (1.651 m), weight 200 lb (90.7 kg), SpO2 93 %.  Physical Exam  Constitutional: He is oriented to person, place, and time and well-developed, well-nourished, and in no  distress.  HENT:  Mouth/Throat: No oral lesions. Normal dentition. No dental caries.  Eyes: No scleral icterus.  Cardiovascular: Normal rate, regular rhythm and normal heart sounds.  Pulmonary/Chest: Tachypnea noted. He is in respiratory distress. He has decreased breath sounds. He has wheezes in the right upper field, the right middle field, the left upper field and the left middle field. He has rales in the right lower field.  Abdominal: Soft. He exhibits no distension. There is no tenderness.  Lymphadenopathy:    He has no cervical adenopathy.  Neurological: He is alert and oriented to person, place, and time.  Skin: Skin is warm and dry. No rash noted.  Psychiatric: Mood and affect normal.   Lab Results Lab Results  Component Value Date   WBC 6.3 03/12/2017   HGB 14.1 03/12/2017   HCT 40.9 03/12/2017   MCV 97.8 03/12/2017   PLT 147 (L) 03/12/2017    Lab Results  Component Value Date   CREATININE 1.04 03/12/2017   BUN 11 03/12/2017   NA 129 (L) 03/12/2017   K 4.5 03/12/2017   CL 91 (L) 03/12/2017   CO2 25 03/12/2017    Lab Results  Component Value Date   ALT 42 01/06/2017   AST 46 (H) 01/06/2017   ALKPHOS 75 01/06/2017   BILITOT 0.4 01/06/2017    HIV 1 RNA Quant (copies/mL)  Date Value  05/11/2016 <20 DETECTED (A)  01/17/2016 11,200  09/21/2015 <20   CD4 T Cell Abs (/uL)  Date Value  06/12/2016 410  05/11/2016 340 (L)  01/17/2016 340 (L)   Microbiology: No results found for this or any previous visit (from the past 240 hour(s)).  Shaun Alberts, MSN, NP-C [facility] Cell: [phone] Pager: [phone]  03/12/2017 4:30 PM

## 2017-03-12 NOTE — ED Triage Notes (Signed)
Pt states sob and chest tightness since last night.  Hx of chf. Breathing labored.

## 2017-03-12 NOTE — ED Notes (Signed)
Pt placed in hospital bed for comfort.  Pt and family updated on bed status and will be holding in ED 

## 2017-03-12 NOTE — Progress Notes (Signed)
Pharmacy Antibiotic Note Shaun Brennan is a 41 y.o. male admitted on 03/12/2017 with worsening SOB over the past two days. Pt has HIV, last RNA and CD4 were ok, earlier this year, planning to recheck today. Starting empiric abx for general pneumonia and possibly PCP pneumonia.   Plan: -Levaquin 750 mg IV q24h -Bactrim 5 mg/kg IV q8h -Monitor renal fx, cultures, duration of therapy   Height: 5\' 5"  (165.1 cm) Weight: 200 lb (90.7 kg) IBW/kg (Calculated) : 61.5  Temp (24hrs), Avg:98.2 F (36.8 C), Min:98.2 F (36.8 C), Max:98.2 F (36.8 C)  Recent Labs  Lab 03/12/17 1327 03/12/17 1506  WBC 6.3  --   CREATININE 1.04  --   LATICACIDVEN  --  3.15*    Estimated Creatinine Clearance: 97.8 mL/min (by C-G formula based on SCr of 1.04 mg/dL).    Allergies  Allergen Reactions  . Lisinopril Cough    Antimicrobials this admission: 12/3 levaquin > 12/3 bactrim >  Dose adjustments this admission: N/A  Microbiology results: 12/3 blood cx: 12/3 PCP:     Shaun Brennan 03/12/2017 5:30 PM

## 2017-03-13 ENCOUNTER — Other Ambulatory Visit: Payer: Self-pay

## 2017-03-13 ENCOUNTER — Encounter (HOSPITAL_COMMUNITY): Payer: Self-pay | Admitting: General Practice

## 2017-03-13 DIAGNOSIS — R0603 Acute respiratory distress: Secondary | ICD-10-CM

## 2017-03-13 LAB — GLUCOSE, CAPILLARY
GLUCOSE-CAPILLARY: 281 mg/dL — AB (ref 65–99)
Glucose-Capillary: 186 mg/dL — ABNORMAL HIGH (ref 65–99)
Glucose-Capillary: 369 mg/dL — ABNORMAL HIGH (ref 65–99)

## 2017-03-13 LAB — RESPIRATORY PANEL BY PCR

## 2017-03-13 LAB — BASIC METABOLIC PANEL
ANION GAP: 11 (ref 5–15)
BUN: 16 mg/dL (ref 6–20)
CHLORIDE: 89 mmol/L — AB (ref 101–111)
CO2: 28 mmol/L (ref 22–32)
Calcium: 8.6 mg/dL — ABNORMAL LOW (ref 8.9–10.3)
Creatinine, Ser: 1.29 mg/dL — ABNORMAL HIGH (ref 0.61–1.24)
Glucose, Bld: 452 mg/dL — ABNORMAL HIGH (ref 65–99)
POTASSIUM: 5.1 mmol/L (ref 3.5–5.1)
SODIUM: 128 mmol/L — AB (ref 135–145)

## 2017-03-13 LAB — CBC
HEMATOCRIT: 37.8 % — AB (ref 39.0–52.0)
Hemoglobin: 13.1 g/dL (ref 13.0–17.0)
MCH: 33.4 pg (ref 26.0–34.0)
MCHC: 34.7 g/dL (ref 30.0–36.0)
MCV: 96.4 fL (ref 78.0–100.0)
Platelets: 154 10*3/uL (ref 150–400)
RBC: 3.92 MIL/uL — AB (ref 4.22–5.81)
RDW: 12.9 % (ref 11.5–15.5)
WBC: 5.7 10*3/uL (ref 4.0–10.5)

## 2017-03-13 LAB — T-HELPER CELLS (CD4) COUNT (NOT AT ARMC)
CD4 T CELL ABS: 150 /uL — AB (ref 400–2700)
CD4 T CELL HELPER: 22 % — AB (ref 33–55)

## 2017-03-13 LAB — CBG MONITORING, ED
GLUCOSE-CAPILLARY: 520 mg/dL — AB (ref 65–99)
GLUCOSE-CAPILLARY: 523 mg/dL — AB (ref 65–99)
GLUCOSE-CAPILLARY: 435 mg/dL — AB (ref 65–99)

## 2017-03-13 LAB — HEMOGLOBIN A1C
Hgb A1c MFr Bld: 10.8 % — ABNORMAL HIGH (ref 4.8–5.6)
Mean Plasma Glucose: 263.26 mg/dL

## 2017-03-13 LAB — MRSA PCR SCREENING: MRSA by PCR: NEGATIVE

## 2017-03-13 MED ORDER — IPRATROPIUM-ALBUTEROL 0.5-2.5 (3) MG/3ML IN SOLN
3.0000 mL | Freq: Three times a day (TID) | RESPIRATORY_TRACT | Status: DC
  Administered 2017-03-13 – 2017-03-14 (×4): 3 mL via RESPIRATORY_TRACT
  Filled 2017-03-13 (×4): qty 3

## 2017-03-13 MED ORDER — INSULIN GLARGINE 100 UNIT/ML ~~LOC~~ SOLN
10.0000 [IU] | Freq: Every day | SUBCUTANEOUS | Status: DC
  Administered 2017-03-13 – 2017-03-15 (×3): 10 [IU] via SUBCUTANEOUS
  Filled 2017-03-13 (×3): qty 0.1

## 2017-03-13 MED ORDER — GUAIFENESIN-DM 100-10 MG/5ML PO SYRP
5.0000 mL | ORAL_SOLUTION | ORAL | Status: DC | PRN
  Administered 2017-03-13 – 2017-03-16 (×3): 5 mL via ORAL
  Filled 2017-03-13 (×4): qty 5

## 2017-03-13 MED ORDER — INSULIN ASPART 100 UNIT/ML ~~LOC~~ SOLN
0.0000 [IU] | Freq: Three times a day (TID) | SUBCUTANEOUS | Status: DC
  Administered 2017-03-13 (×2): 20 [IU] via SUBCUTANEOUS
  Administered 2017-03-13: 4 [IU] via SUBCUTANEOUS
  Administered 2017-03-14: 11 [IU] via SUBCUTANEOUS
  Administered 2017-03-14: 7 [IU] via SUBCUTANEOUS
  Administered 2017-03-14 – 2017-03-15 (×2): 20 [IU] via SUBCUTANEOUS
  Administered 2017-03-15 (×2): 11 [IU] via SUBCUTANEOUS
  Administered 2017-03-16: 7 [IU] via SUBCUTANEOUS
  Administered 2017-03-16: 15 [IU] via SUBCUTANEOUS
  Administered 2017-03-17: 20 [IU] via SUBCUTANEOUS
  Administered 2017-03-17: 11 [IU] via SUBCUTANEOUS
  Administered 2017-03-17: 7 [IU] via SUBCUTANEOUS
  Administered 2017-03-18: 11 [IU] via SUBCUTANEOUS
  Administered 2017-03-18: 15 [IU] via SUBCUTANEOUS
  Administered 2017-03-18: 11 [IU] via SUBCUTANEOUS
  Administered 2017-03-19: 4 [IU] via SUBCUTANEOUS
  Administered 2017-03-19: 20 [IU] via SUBCUTANEOUS
  Administered 2017-03-20: 4 [IU] via SUBCUTANEOUS
  Administered 2017-03-20: 7 [IU] via SUBCUTANEOUS
  Filled 2017-03-13 (×2): qty 1

## 2017-03-13 MED ORDER — INSULIN ASPART 100 UNIT/ML ~~LOC~~ SOLN
0.0000 [IU] | Freq: Every day | SUBCUTANEOUS | Status: DC
  Administered 2017-03-13: 5 [IU] via SUBCUTANEOUS
  Administered 2017-03-14: 3 [IU] via SUBCUTANEOUS
  Administered 2017-03-15 – 2017-03-16 (×2): 5 [IU] via SUBCUTANEOUS
  Administered 2017-03-17: 4 [IU] via SUBCUTANEOUS
  Administered 2017-03-18: 5 [IU] via SUBCUTANEOUS
  Administered 2017-03-19: 3 [IU] via SUBCUTANEOUS

## 2017-03-13 NOTE — Progress Notes (Signed)
INFECTIOUS DISEASE PROGRESS NOTE  ID: Shaun Brennan is a 41 y.o. male with  Principal Problem:   Acute respiratory distress Active Problems:   DM2 (diabetes mellitus, type 2) (HCC)   HIV (human immunodeficiency virus infection) (HCC)   HTN (hypertension)   Chronic systolic heart failure (HCC)   Pneumonia   Alcohol abuse with alcohol-induced mood disorder (HCC)  Subjective: No complaints, no cough.   Abtx:  Anti-infectives (From admission, onward)   Start     Dose/Rate Route Frequency Ordered Stop   03/13/17 0200  sulfamethoxazole-trimethoprim (BACTRIM) 452.8 mg in dextrose 5 % 500 mL IVPB     15 mg/kg/day  90.7 kg 352.2 mL/hr over 90 Minutes Intravenous Every 8 hours 03/12/17 1824     03/12/17 1800  levofloxacin (LEVAQUIN) IVPB 750 mg     750 mg 100 mL/hr over 90 Minutes Intravenous Every 24 hours 03/12/17 1730     03/12/17 1730  sulfamethoxazole-trimethoprim (BACTRIM) 452.8 mg in dextrose 5 % 500 mL IVPB     5 mg/kg  90.7 kg 352.2 mL/hr over 90 Minutes Intravenous  Once 03/12/17 1625 03/12/17 1859   03/12/17 1730  dolutegravir (TIVICAY) tablet 50 mg     50 mg Oral Daily 03/12/17 1719     03/12/17 1730  emtricitabine-tenofovir AF (DESCOVY) 200-25 MG per tablet 1 tablet     1 tablet Oral Daily 03/12/17 1719        Medications:  Scheduled: . aspirin EC  81 mg Oral Daily  . atorvastatin  20 mg Oral Daily  . carvedilol  25 mg Oral BID  . dolutegravir  50 mg Oral Daily  . emtricitabine-tenofovir AF  1 tablet Oral Daily  . guaiFENesin  600 mg Oral BID  . heparin  5,000 Units Subcutaneous Q8H  . hydrALAZINE  100 mg Oral TID  . insulin aspart  0-20 Units Subcutaneous TID WC  . insulin aspart  0-5 Units Subcutaneous QHS  . insulin glargine  10 Units Subcutaneous Daily  . ipratropium-albuterol  3 mL Nebulization TID  . isosorbide mononitrate  60 mg Oral Daily  . predniSONE  40 mg Oral BID WC  . senna  1 tablet Oral BID  . sodium chloride flush  3 mL Intravenous Q12H      Objective: Vital signs in last 24 hours: Temp:  [98 F (36.7 C)-98.2 F (36.8 C)] 98 F (36.7 C) (12/04 1332) Pulse Rate:  [80-108] 85 (12/04 1332) Resp:  [14-28] 18 (12/04 1332) BP: (129-178)/(73-132) 137/80 (12/04 1332) SpO2:  [93 %-100 %] 100 % (12/04 1332)   General appearance: alert, cooperative and no distress Resp: rhonchi anterior - bilateral Cardio: regular rate and rhythm GI: normal findings: bowel sounds normal and soft, non-tender Extremities: edema none  Lab Results Recent Labs    03/12/17 1327 03/13/17 0454  WBC 6.3 5.7  HGB 14.1 13.1  HCT 40.9 37.8*  NA 129* 128*  K 4.5 5.1  CL 91* 89*  CO2 25 28  BUN 11 16  CREATININE 1.04 1.29*   Liver Panel Recent Labs    03/12/17 1448  PROT 7.8  ALBUMIN 3.5  AST 36  ALT 42  ALKPHOS 95  BILITOT 0.5  BILIDIR <0.1*  IBILI NOT CALCULATED   Sedimentation Rate No results for input(s): ESRSEDRATE in the last 72 hours. C-Reactive Protein No results for input(s): CRP in the last 72 hours.  Microbiology: Recent Results (from the past 240 hour(s))  Blood culture (routine x 2)  Status: None (Preliminary result)   Collection Time: 03/12/17  2:49 PM  Result Value Ref Range Status   Specimen Description BLOOD RIGHT ANTECUBITAL  Final   Special Requests   Final    BOTTLES DRAWN AEROBIC AND ANAEROBIC Blood Culture adequate volume   Culture NO GROWTH < 24 HOURS  Final   Report Status PENDING  Incomplete  Blood culture (routine x 2)     Status: None (Preliminary result)   Collection Time: 03/12/17  2:50 PM  Result Value Ref Range Status   Specimen Description BLOOD LEFT ANTECUBITAL  Final   Special Requests   Final    BOTTLES DRAWN AEROBIC AND ANAEROBIC Blood Culture adequate volume   Culture NO GROWTH < 24 HOURS  Final   Report Status PENDING  Incomplete    Studies/Results: Dg Chest 2 View  Result Date: 03/12/2017 CLINICAL DATA:  Shortness of breath and cough EXAM: CHEST  2 VIEW COMPARISON:   January 06, 2017 FINDINGS: There are multiple calcified granulomas scattered throughout the lungs. There is no edema or consolidation. Heart is borderline prominent with pulmonary vascularity within normal limits. No adenopathy. No evident bone lesions. IMPRESSION: Multiple scattered calcified granulomas. No edema or consolidation. Heart remains borderline prominent. No adenopathy evident. Electronically Signed   By: Bretta Bang III M.D.   On: 03/12/2017 14:02   Ct Angio Chest Pe W And/or Wo Contrast  Result Date: 03/12/2017 CLINICAL DATA:  Chest pain and shortness of breath. History of HIV disease. EXAM: CT ANGIOGRAPHY CHEST WITH CONTRAST TECHNIQUE: Multidetector CT imaging of the chest was performed using the standard protocol during bolus administration of intravenous contrast. Multiplanar CT image reconstructions and MIPs were obtained to evaluate the vascular anatomy. CONTRAST:  ISOVUE-370 IOPAMIDOL (ISOVUE-370) INJECTION 76% COMPARISON:  Chest CT June 11, 2016; Chest CT October 03, 2016; chest radiograph March 12, 2017 FINDINGS: Cardiovascular: There is no demonstrable pulmonary embolus. Visualized great vessels appear unremarkable. No thoracic aortic aneurysm or dissection. Pericardium is not appreciably thickened. There is mild left ventricular hypertrophy. The main pulmonary outflow tract measures 3.4 cm, an abnormal value concerning for underlying pulmonary arterial hypertension. Mediastinum/Nodes: Thyroid appears unremarkable. There is a stable lymph node anterior to the left main bronchus measuring 1.7 x 1.1 cm. There is a stable sub- carinal lymph node measuring 2.1 x 1.4 cm. There is a stable lymph node in the right high the measuring 1.2 x 1.1 cm. No new lymph node prominence is evident. There is no appreciable esophageal lesion. Lungs/Pleura: There is widespread patchy airspace opacity, most notably throughout the left lower lobe and lingula but also present at several sites  throughout the right lung. There are multiple scattered calcified granulomas. There is a small right pleural effusion. Upper Abdomen: There is reflux of contrast into the inferior vena cava and hepatic veins. Visualized upper abdominal structures otherwise appear unremarkable. Musculoskeletal: There are no blastic or lytic bone lesions. Review of the MIP images confirms the above findings. IMPRESSION: 1.  No demonstrable pulmonary embolus. 2. Multifocal airspace opacification, likely multifocal pneumonia. Given history of HIV disease, Pneumocystis must be of particular concern. Allergic type phenomenon could present in this manner. Both infectious etiology an allergic type phenomenon may present concurrently. 3.  Small right pleural effusion. 4. Evidence of prior granulomatous disease with multiple calcified granulomas noted. 5.  Stable areas of adenopathy.  No new adenopathy evident. 6. Enlargement of the main pulmonary outflow tract is felt to be indicative of a degree of underlying pulmonary  arterial hypertension. 7. Reflux of contrast into the inferior vena cava and hepatic veins likely is indicative of a degree of increase in right heart pressure. 8.  There is a degree of left ventricular hypertrophy. Electronically Signed   By: Bretta Bang III M.D.   On: 03/12/2017 15:59     Assessment/Plan: AIDS Pneumonia Fluid overload (-4.6 L) Hyperglycemia  Total days of antibiotics: 1 bactrim, prednisone, levaquin  Continue his ART await HIV RNA Check flu panel (not epidemic levels yet) to see if he needs isolation Send sputum PCP stain.  No change in his anbx at this point (could change to po bactrim soon) Improved diabetic control.          [name] MD, FACP Infectious Diseases (pager) [phone] [email or web address] 03/13/2017, 2:31 PM  LOS: 1 day

## 2017-03-13 NOTE — Progress Notes (Signed)
PROGRESS NOTE  Shaun BenRaheem Polkowski  UUV:[id number] DOB: 1975-05-11 DOA: 03/12/2017 PCP: Patient, No Pcp Per  Brief Narrative: Shaun Brennan is a 41 y.o. male with a history of HIV disease, chronic systolic CHF, IDDM, and HTN who presented to the ED with productive cough associated with pleuritic chest pain, dyspnea, and chills for 24 hours. In the EDhe was found to be hypoxic with CTA chest ruling out PE, demonstrating multifocal pneumonia with concern for PCP or atypical pneumonia.     Assessment & Plan: Principal Problem:   Acute respiratory distress Active Problems:   DM2 (diabetes mellitus, type 2) (HCC)   HIV (human immunodeficiency virus infection) (HCC)   HTN (hypertension)   Chronic systolic heart failure (HCC)   Pneumonia   Alcohol abuse with alcohol-induced mood disorder (HCC)  Acute hypoxic respiratory failure: Due to multifocal pneumonia.  - Continue supplemental oxygen as needed to maintain SpO2 >90% - Continue mucolytics, bronchodilators  Multifocal pneumonia:  - Treating as PCP pneumonia with levaquin, bactrim and steroids. Appreciate ID consult.  - Sputum culture with PCP staining pending - Empiric droplet precautions, check flu.   HIV disease:  - CD4 and viral load pending (previously >400 and undetectable, respectively) - Continued HAART at admission. Per H&P, ?adherence.   Chronic HFrEF: Most recent EF 40-45%, G1DD, diffuse hypokinesis. BNP elevated at 350, though this is below historic baseline. No significant pulmonary edema noted on CTA chest, but did have reflux into IVC c/w elevated R heart pressures. Give lasix IV x1 in ED.  - Strict I/O, daily weights - Will hold lasix for now (normally takes this twice weekly) - continue coreg, bidil, ASA - Will order repeat echo.  IDDM with hyperglycemia: Pt reportedly ate fast food a lot overnight. HbA1c 10.8%. - Treating with high doses novolog today, plan to start lantus, augment to resistant SSI and add HS coverage.     DVT prophylaxis: Heparin Code Status: Full Family Communication: None at bedside Disposition Plan: Awaiting bed for admission from ED.  Consultants:   ID  Procedures:   Echo ordered  Antimicrobials:  Levaquin, bactrim 12/3 >>    Subjective: Still dyspneic with chest pain while coughing. Slight improvement since admission. Reports full adherence to HAART to me.   Objective: Vitals:   03/13/17 1000 03/13/17 1200 03/13/17 1332 03/13/17 1624  BP: 132/82 130/73 137/80 136/81  Pulse: 95 84 85 89  Resp: 15 14 18    Temp:   98 F (36.7 C) 98.4 F (36.9 C)  TempSrc:   Oral Oral  SpO2: 96% 98% 100% 97%  Weight:      Height:        Intake/Output Summary (Last 24 hours) at 03/13/2017 1727 Last data filed at 03/13/2017 1349 Gross per 24 hour  Intake 2799 ml  Output 5980 ml  Net -3181 ml   Filed Weights   03/12/17 1320  Weight: 90.7 kg (200 lb)    Gen: 41 y.o. male in no distress Pulm: Mildly labored tachypnea. Bilateral rhonchi.  CV: Regular rate and rhythm. No murmur, rub, or gallop. No JVD, no pedal edema. GI: Abdomen soft, non-tender, non-distended, with normoactive bowel sounds. No organomegaly or masses felt. Ext: Warm, no deformities Skin: No rashes, lesions no ulcers Neuro: Alert and oriented. No focal neurological deficits. Psych: Judgement and insight appear normal. Mood & affect appropriate.   CBC: Recent Labs  Lab 03/12/17 1327 03/13/17 0454  WBC 6.3 5.7  HGB 14.1 13.1  HCT 40.9 37.8*  MCV 97.8 96.4  PLT 147* 154   Basic Metabolic Panel: Recent Labs  Lab 03/12/17 1327 03/13/17 0454  NA 129* 128*  K 4.5 5.1  CL 91* 89*  CO2 25 28  GLUCOSE 490* 452*  BUN 11 16  CREATININE 1.04 1.29*  CALCIUM 8.8* 8.6*   GFR: Estimated Creatinine Clearance: 78.8 mL/min (A) (by C-G formula based on SCr of 1.29 mg/dL (H)).  Liver Function Tests: Recent Labs  Lab 03/12/17 1448  AST 36  ALT 42  ALKPHOS 95  BILITOT 0.5  PROT 7.8  ALBUMIN 3.5    Cardiac Enzymes: Recent Labs  Lab 03/12/17 1448  TROPONINI 0.09*   HbA1C: Recent Labs    03/13/17 0454  HGBA1C 10.8*   CBG: Recent Labs  Lab 03/13/17 0915 03/13/17 1043 03/13/17 1145 03/13/17 1342 03/13/17 1622  GLUCAP 520* 523* 435* 281* 186*   Urine analysis:    Component Value Date/Time   COLORURINE YELLOW 10/03/2016 1234   APPEARANCEUR CLEAR 10/03/2016 1234   LABSPEC 1.030 10/03/2016 1234   PHURINE 5.0 10/03/2016 1234   GLUCOSEU >=500 (A) 10/03/2016 1234   HGBUR SMALL (A) 10/03/2016 1234   BILIRUBINUR NEGATIVE 10/03/2016 1234   KETONESUR NEGATIVE 10/03/2016 1234   PROTEINUR 100 (A) 10/03/2016 1234   UROBILINOGEN 0.2 12/20/2013 1321   NITRITE NEGATIVE 10/03/2016 1234   LEUKOCYTESUR NEGATIVE 10/03/2016 1234   Recent Results (from the past 240 hour(s))  Blood culture (routine x 2)     Status: None (Preliminary result)   Collection Time: 03/12/17  2:49 PM  Result Value Ref Range Status   Specimen Description BLOOD RIGHT ANTECUBITAL  Final   Special Requests   Final    BOTTLES DRAWN AEROBIC AND ANAEROBIC Blood Culture adequate volume   Culture NO GROWTH < 24 HOURS  Final   Report Status PENDING  Incomplete  Blood culture (routine x 2)     Status: None (Preliminary result)   Collection Time: 03/12/17  2:50 PM  Result Value Ref Range Status   Specimen Description BLOOD LEFT ANTECUBITAL  Final   Special Requests   Final    BOTTLES DRAWN AEROBIC AND ANAEROBIC Blood Culture adequate volume   Culture NO GROWTH < 24 HOURS  Final   Report Status PENDING  Incomplete  MRSA PCR Screening     Status: None   Collection Time: 03/13/17  2:02 PM  Result Value Ref Range Status   MRSA by PCR NEGATIVE NEGATIVE Final    Comment:        The GeneXpert MRSA Assay (FDA approved for NASAL specimens only), is one component of a comprehensive MRSA colonization surveillance program. It is not intended to diagnose MRSA infection nor to guide or monitor treatment for MRSA  infections.       Radiology Studies: Dg Chest 2 View  Result Date: 03/12/2017 CLINICAL DATA:  Shortness of breath and cough EXAM: CHEST  2 VIEW COMPARISON:  January 06, 2017 FINDINGS: There are multiple calcified granulomas scattered throughout the lungs. There is no edema or consolidation. Heart is borderline prominent with pulmonary vascularity within normal limits. No adenopathy. No evident bone lesions. IMPRESSION: Multiple scattered calcified granulomas. No edema or consolidation. Heart remains borderline prominent. No adenopathy evident. Electronically Signed   By: Bretta Bang III M.D.   On: 03/12/2017 14:02   Ct Angio Chest Pe W And/or Wo Contrast  Result Date: 03/12/2017 CLINICAL DATA:  Chest pain and shortness of breath. History of HIV disease. EXAM: CT ANGIOGRAPHY CHEST WITH CONTRAST TECHNIQUE: Multidetector CT  imaging of the chest was performed using the standard protocol during bolus administration of intravenous contrast. Multiplanar CT image reconstructions and MIPs were obtained to evaluate the vascular anatomy. CONTRAST:  ISOVUE-370 IOPAMIDOL (ISOVUE-370) INJECTION 76% COMPARISON:  Chest CT June 11, 2016; Chest CT October 03, 2016; chest radiograph March 12, 2017 FINDINGS: Cardiovascular: There is no demonstrable pulmonary embolus. Visualized great vessels appear unremarkable. No thoracic aortic aneurysm or dissection. Pericardium is not appreciably thickened. There is mild left ventricular hypertrophy. The main pulmonary outflow tract measures 3.4 cm, an abnormal value concerning for underlying pulmonary arterial hypertension. Mediastinum/Nodes: Thyroid appears unremarkable. There is a stable lymph node anterior to the left main bronchus measuring 1.7 x 1.1 cm. There is a stable sub- carinal lymph node measuring 2.1 x 1.4 cm. There is a stable lymph node in the right high the measuring 1.2 x 1.1 cm. No new lymph node prominence is evident. There is no appreciable esophageal  lesion. Lungs/Pleura: There is widespread patchy airspace opacity, most notably throughout the left lower lobe and lingula but also present at several sites throughout the right lung. There are multiple scattered calcified granulomas. There is a small right pleural effusion. Upper Abdomen: There is reflux of contrast into the inferior vena cava and hepatic veins. Visualized upper abdominal structures otherwise appear unremarkable. Musculoskeletal: There are no blastic or lytic bone lesions. Review of the MIP images confirms the above findings. IMPRESSION: 1.  No demonstrable pulmonary embolus. 2. Multifocal airspace opacification, likely multifocal pneumonia. Given history of HIV disease, Pneumocystis must be of particular concern. Allergic type phenomenon could present in this manner. Both infectious etiology an allergic type phenomenon may present concurrently. 3.  Small right pleural effusion. 4. Evidence of prior granulomatous disease with multiple calcified granulomas noted. 5.  Stable areas of adenopathy.  No new adenopathy evident. 6. Enlargement of the main pulmonary outflow tract is felt to be indicative of a degree of underlying pulmonary arterial hypertension. 7. Reflux of contrast into the inferior vena cava and hepatic veins likely is indicative of a degree of increase in right heart pressure. 8.  There is a degree of left ventricular hypertrophy. Electronically Signed   By: Bretta Bang III M.D.   On: 03/12/2017 15:59    Scheduled Meds: . aspirin EC  81 mg Oral Daily  . atorvastatin  20 mg Oral Daily  . carvedilol  25 mg Oral BID  . dolutegravir  50 mg Oral Daily  . emtricitabine-tenofovir AF  1 tablet Oral Daily  . guaiFENesin  600 mg Oral BID  . heparin  5,000 Units Subcutaneous Q8H  . hydrALAZINE  100 mg Oral TID  . insulin aspart  0-20 Units Subcutaneous TID WC  . insulin aspart  0-5 Units Subcutaneous QHS  . insulin glargine  10 Units Subcutaneous Daily  . ipratropium-albuterol   3 mL Nebulization TID  . isosorbide mononitrate  60 mg Oral Daily  . predniSONE  40 mg Oral BID WC  . senna  1 tablet Oral BID  . sodium chloride flush  3 mL Intravenous Q12H   Continuous Infusions: . sodium chloride    . levofloxacin (LEVAQUIN) IV 750 mg (03/13/17 1655)  . sulfamethoxazole-trimethoprim Stopped (03/13/17 1223)     LOS: 1 day   Time spent: 25 minutes.  Hazeline Junker, MD [facility] Pager [phone]  If 7PM-7AM, please contact night-coverage [email or web address] Password [email or web address] 03/13/2017, 5:27 PM

## 2017-03-13 NOTE — ED Notes (Signed)
Spoke with admitting MD, Dr. Jarvis Newcomer, reports  To give 20 units SQ insulin now, has ordered lantus scheduled daily med. Recheck around noon, and let Dr. Jarvis Newcomer know CBG.

## 2017-03-14 ENCOUNTER — Inpatient Hospital Stay (HOSPITAL_COMMUNITY): Payer: Self-pay

## 2017-03-14 DIAGNOSIS — I509 Heart failure, unspecified: Secondary | ICD-10-CM

## 2017-03-14 DIAGNOSIS — B20 Human immunodeficiency virus [HIV] disease: Secondary | ICD-10-CM

## 2017-03-14 DIAGNOSIS — E1169 Type 2 diabetes mellitus with other specified complication: Secondary | ICD-10-CM

## 2017-03-14 LAB — CBC
HCT: 37.2 % — ABNORMAL LOW (ref 39.0–52.0)
HEMOGLOBIN: 12.6 g/dL — AB (ref 13.0–17.0)
MCH: 32.8 pg (ref 26.0–34.0)
MCHC: 33.9 g/dL (ref 30.0–36.0)
MCV: 96.9 fL (ref 78.0–100.0)
PLATELETS: 148 10*3/uL — AB (ref 150–400)
RBC: 3.84 MIL/uL — ABNORMAL LOW (ref 4.22–5.81)
RDW: 13 % (ref 11.5–15.5)
WBC: 8.9 10*3/uL (ref 4.0–10.5)

## 2017-03-14 LAB — BASIC METABOLIC PANEL
Anion gap: 11 (ref 5–15)
BUN: 24 mg/dL — AB (ref 6–20)
CALCIUM: 8.9 mg/dL (ref 8.9–10.3)
CO2: 23 mmol/L (ref 22–32)
CREATININE: 1.2 mg/dL (ref 0.61–1.24)
Chloride: 93 mmol/L — ABNORMAL LOW (ref 101–111)
GFR calc Af Amer: >60 mL/min (ref >60)
GFR calc non Af Amer: >60 mL/min (ref >60)
GLUCOSE: 344 mg/dL — AB (ref 65–99)
Potassium: 4.7 mmol/L (ref 3.5–5.1)
Sodium: 127 mmol/L — ABNORMAL LOW (ref 135–145)

## 2017-03-14 LAB — GLUCOSE, CAPILLARY
GLUCOSE-CAPILLARY: 301 mg/dL — AB (ref 65–99)
Glucose-Capillary: 294 mg/dL — ABNORMAL HIGH (ref 65–99)
Glucose-Capillary: 240 mg/dL — ABNORMAL HIGH (ref 65–99)
Glucose-Capillary: 400 mg/dL — ABNORMAL HIGH (ref 65–99)

## 2017-03-14 LAB — HIV-1 RNA QUANT-NO REFLEX-BLD: LOG10 HIV-1 RNA: UNDETERMINED log10copy/mL

## 2017-03-14 LAB — ECHOCARDIOGRAM COMPLETE
HEIGHTINCHES: 65 in
WEIGHTICAEL: 3200 [oz_av]

## 2017-03-14 LAB — HIV-1 RNA ULTRAQUANT REFLEX TO GENTYP+
HIV-1 RNA BY PCR: 40 {copies}/mL
HIV-1 RNA Quant, Log: 1.602 log10copy/mL

## 2017-03-14 MED ORDER — IPRATROPIUM-ALBUTEROL 0.5-2.5 (3) MG/3ML IN SOLN
3.0000 mL | Freq: Four times a day (QID) | RESPIRATORY_TRACT | Status: DC
  Administered 2017-03-14 – 2017-03-16 (×6): 3 mL via RESPIRATORY_TRACT
  Filled 2017-03-14 (×7): qty 3

## 2017-03-14 MED ORDER — SODIUM CHLORIDE 3 % IN NEBU
4.0000 mL | INHALATION_SOLUTION | Freq: Every day | RESPIRATORY_TRACT | Status: AC
  Administered 2017-03-14: 4 mL via RESPIRATORY_TRACT
  Filled 2017-03-14: qty 4

## 2017-03-14 MED ORDER — IPRATROPIUM-ALBUTEROL 0.5-2.5 (3) MG/3ML IN SOLN: 3.0000 mL | RESPIRATORY_TRACT | Status: DC | PRN

## 2017-03-14 NOTE — Progress Notes (Signed)
Attempt to obtain sputum with hypertonic saline.  Strong cough remains dry, nonproductive.  Collection container at bedside.  Pt instructed to notify RN if able to produce sputum.

## 2017-03-14 NOTE — Progress Notes (Signed)
INFECTIOUS DISEASE PROGRESS NOTE  ID: Shaun Brennan is a 41 y.o. male with  Principal Problem:   Acute respiratory distress Active Problems:   DM2 (diabetes mellitus, type 2) (HCC)   HIV (human immunodeficiency virus infection) (HCC)   HTN (hypertension)   Chronic systolic heart failure (HCC)   Pneumonia   Alcohol abuse with alcohol-induced mood disorder (HCC)  Subjective: Asleep   Abtx:  Anti-infectives (From admission, onward)   Start     Dose/Rate Route Frequency Ordered Stop   03/13/17 0200  sulfamethoxazole-trimethoprim (BACTRIM) 452.8 mg in dextrose 5 % 500 mL IVPB     15 mg/kg/day  90.7 kg 352.2 mL/hr over 90 Minutes Intravenous Every 8 hours 03/12/17 1824     03/12/17 1800  levofloxacin (LEVAQUIN) IVPB 750 mg     750 mg 100 mL/hr over 90 Minutes Intravenous Every 24 hours 03/12/17 1730     03/12/17 1730  sulfamethoxazole-trimethoprim (BACTRIM) 452.8 mg in dextrose 5 % 500 mL IVPB     5 mg/kg  90.7 kg 352.2 mL/hr over 90 Minutes Intravenous  Once 03/12/17 1625 03/12/17 1859   03/12/17 1730  dolutegravir (TIVICAY) tablet 50 mg     50 mg Oral Daily 03/12/17 1719     03/12/17 1730  emtricitabine-tenofovir AF (DESCOVY) 200-25 MG per tablet 1 tablet     1 tablet Oral Daily 03/12/17 1719        Medications:  Scheduled: . aspirin EC  81 mg Oral Daily  . atorvastatin  20 mg Oral Daily  . carvedilol  25 mg Oral BID  . dolutegravir  50 mg Oral Daily  . emtricitabine-tenofovir AF  1 tablet Oral Daily  . guaiFENesin  600 mg Oral BID  . heparin  5,000 Units Subcutaneous Q8H  . hydrALAZINE  100 mg Oral TID  . insulin aspart  0-20 Units Subcutaneous TID WC  . insulin aspart  0-5 Units Subcutaneous QHS  . insulin glargine  10 Units Subcutaneous Daily  . ipratropium-albuterol  3 mL Nebulization Q6H  . isosorbide mononitrate  60 mg Oral Daily  . predniSONE  40 mg Oral BID WC  . senna  1 tablet Oral BID  . sodium chloride flush  3 mL Intravenous Q12H     Objective: Vital signs in last 24 hours: Temp:  [97.7 F (36.5 C)-98.4 F (36.9 C)] 97.7 F (36.5 C) (12/05 0900) Pulse Rate:  [72-89] 81 (12/05 0900) Resp:  [14-20] 20 (12/05 0900) BP: (123-139)/(73-99) 139/99 (12/05 0900) SpO2:  [94 %-100 %] 95 % (12/05 0902)  General appearance: alert, cooperative and no distress Resp: rhonchi anterior - bilateral and posterior - bilateral and snoring  Cardio: regular rate and rhythm GI: normal findings: bowel sounds normal and soft, non-tender Extremities: edema none Neurologic: Mental status: asleep   Lab Results Recent Labs    03/13/17 0454 03/14/17 0406  WBC 5.7 8.9  HGB 13.1 12.6*  HCT 37.8* 37.2*  NA 128* 127*  K 5.1 4.7  CL 89* 93*  CO2 28 23  BUN 16 24*  CREATININE 1.29* 1.20   Liver Panel Recent Labs    03/12/17 1448  PROT 7.8  ALBUMIN 3.5  AST 36  ALT 42  ALKPHOS 95  BILITOT 0.5  BILIDIR <0.1*  IBILI NOT CALCULATED   Sedimentation Rate No results for input(s): ESRSEDRATE in the last 72 hours. C-Reactive Protein No results for input(s): CRP in the last 72 hours.  Microbiology: Recent Results (from the past 240 hour(s))  Blood  culture (routine x 2)     Status: None (Preliminary result)   Collection Time: 03/12/17  2:49 PM  Result Value Ref Range Status   Specimen Description BLOOD RIGHT ANTECUBITAL  Final   Special Requests   Final    BOTTLES DRAWN AEROBIC AND ANAEROBIC Blood Culture adequate volume   Culture NO GROWTH < 24 HOURS  Final   Report Status PENDING  Incomplete  Blood culture (routine x 2)     Status: None (Preliminary result)   Collection Time: 03/12/17  2:50 PM  Result Value Ref Range Status   Specimen Description BLOOD LEFT ANTECUBITAL  Final   Special Requests   Final    BOTTLES DRAWN AEROBIC AND ANAEROBIC Blood Culture adequate volume   Culture NO GROWTH < 24 HOURS  Final   Report Status PENDING  Incomplete  MRSA PCR Screening     Status: None   Collection Time: 03/13/17  2:02 PM   Result Value Ref Range Status   MRSA by PCR NEGATIVE NEGATIVE Final    Comment:        The GeneXpert MRSA Assay (FDA approved for NASAL specimens only), is one component of a comprehensive MRSA colonization surveillance program. It is not intended to diagnose MRSA infection nor to guide or monitor treatment for MRSA infections.   Respiratory Panel by PCR     Status: None   Collection Time: 03/13/17  3:15 PM  Result Value Ref Range Status   Adenovirus NOT DETECTED NOT DETECTED Final   Coronavirus 229E NOT DETECTED NOT DETECTED Final   Coronavirus HKU1 NOT DETECTED NOT DETECTED Final   Coronavirus NL63 NOT DETECTED NOT DETECTED Final   Coronavirus OC43 NOT DETECTED NOT DETECTED Final   Metapneumovirus NOT DETECTED NOT DETECTED Final   Rhinovirus / Enterovirus NOT DETECTED NOT DETECTED Final   Influenza A NOT DETECTED NOT DETECTED Final   Influenza B NOT DETECTED NOT DETECTED Final   Parainfluenza Virus 1 NOT DETECTED NOT DETECTED Final   Parainfluenza Virus 2 NOT DETECTED NOT DETECTED Final   Parainfluenza Virus 3 NOT DETECTED NOT DETECTED Final   Parainfluenza Virus 4 NOT DETECTED NOT DETECTED Final   Respiratory Syncytial Virus NOT DETECTED NOT DETECTED Final   Bordetella pertussis NOT DETECTED NOT DETECTED Final   Chlamydophila pneumoniae NOT DETECTED NOT DETECTED Final   Mycoplasma pneumoniae NOT DETECTED NOT DETECTED Final    Studies/Results: Dg Chest 2 View  Result Date: 03/12/2017 CLINICAL DATA:  Shortness of breath and cough EXAM: CHEST  2 VIEW COMPARISON:  January 06, 2017 FINDINGS: There are multiple calcified granulomas scattered throughout the lungs. There is no edema or consolidation. Heart is borderline prominent with pulmonary vascularity within normal limits. No adenopathy. No evident bone lesions. IMPRESSION: Multiple scattered calcified granulomas. No edema or consolidation. Heart remains borderline prominent. No adenopathy evident. Electronically Signed    By: Bretta BangWilliam  Woodruff III M.D.   On: 03/12/2017 14:02   Ct Angio Chest Pe W And/or Wo Contrast  Result Date: 03/12/2017 CLINICAL DATA:  Chest pain and shortness of breath. History of HIV disease. EXAM: CT ANGIOGRAPHY CHEST WITH CONTRAST TECHNIQUE: Multidetector CT imaging of the chest was performed using the standard protocol during bolus administration of intravenous contrast. Multiplanar CT image reconstructions and MIPs were obtained to evaluate the vascular anatomy. CONTRAST:  100mL ISOVUE-370 IOPAMIDOL (ISOVUE-370) INJECTION 76% COMPARISON:  Chest CT June 11, 2016; Chest CT October 03, 2016; chest radiograph March 12, 2017 FINDINGS: Cardiovascular: There is no demonstrable pulmonary embolus.  Visualized great vessels appear unremarkable. No thoracic aortic aneurysm or dissection. Pericardium is not appreciably thickened. There is mild left ventricular hypertrophy. The main pulmonary outflow tract measures 3.4 cm, an abnormal value concerning for underlying pulmonary arterial hypertension. Mediastinum/Nodes: Thyroid appears unremarkable. There is a stable lymph node anterior to the left main bronchus measuring 1.7 x 1.1 cm. There is a stable sub- carinal lymph node measuring 2.1 x 1.4 cm. There is a stable lymph node in the right high the measuring 1.2 x 1.1 cm. No new lymph node prominence is evident. There is no appreciable esophageal lesion. Lungs/Pleura: There is widespread patchy airspace opacity, most notably throughout the left lower lobe and lingula but also present at several sites throughout the right lung. There are multiple scattered calcified granulomas. There is a small right pleural effusion. Upper Abdomen: There is reflux of contrast into the inferior vena cava and hepatic veins. Visualized upper abdominal structures otherwise appear unremarkable. Musculoskeletal: There are no blastic or lytic bone lesions. Review of the MIP images confirms the above findings. IMPRESSION: 1.  No demonstrable  pulmonary embolus. 2. Multifocal airspace opacification, likely multifocal pneumonia. Given history of HIV disease, Pneumocystis must be of particular concern. Allergic type phenomenon could present in this manner. Both infectious etiology an allergic type phenomenon may present concurrently. 3.  Small right pleural effusion. 4. Evidence of prior granulomatous disease with multiple calcified granulomas noted. 5.  Stable areas of adenopathy.  No new adenopathy evident. 6. Enlargement of the main pulmonary outflow tract is felt to be indicative of a degree of underlying pulmonary arterial hypertension. 7. Reflux of contrast into the inferior vena cava and hepatic veins likely is indicative of a degree of increase in right heart pressure. 8.  There is a degree of left ventricular hypertrophy. Electronically Signed   By: Bretta Bang III M.D.   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  Pneumonia (PJP vs CAP?) - afebrile - requiring 2L oxygen via [place]  Fluid overload d/t CHF exacerbation (~5 L out) Hyperglycemia (2/2 poorly controlled baseline DM and now with prednisone added for hypoxia)   Plan: Total days of antibiotics: 2  Bactrim, Levaquin  - Respiratory panel negative - D/C Droplet  - Continue his ART --> HIV RNA pending  - Would ask RT to obtain provoked sputum sample to send for pneumocystis stain for definitive diagnosis  - Wean oxygen as tolerated  - Continue Bactrim --> consider changing to PO 2 DS tabs TID tomorrow as he is still requiring oxygen (day 2/21)  - Continue Levaquin for now --> can change to PO (day 2)  - Continue Prednisone with taper   40mg  bid x 5 days total (day 2/5)  40mg  daily x 5 days  20mg  x 11 days  - Appreciate glycemic control from primary team.        Rexene Alberts, MSN, NP-C [facility] Cell: [phone] Pager: [phone]  03/14/2017  11:21 AM

## 2017-03-14 NOTE — Progress Notes (Signed)
  Echocardiogram 2D Echocardiogram has been performed.  Shaun Brennan 03/14/2017, 10:23 AM

## 2017-03-14 NOTE — Progress Notes (Signed)
PROGRESS NOTE    Shaun Brennan  XTA:[id number] DOB: [birth year] DOA: 03/12/2017 PCP: Patient, No Pcp Per    Brief Narrative:  41 year old male who presented with dyspnea. Patient does have significant past medical history of HIV, type 2 diabetes mellitus, systolic heart failure, and hypertension. Complaint of 48 hours of her tic cough, chills, diaphoresis,chest pain and dyspnea. Worsening symptoms over last 24 hours prior to hospitalization. Questionable noncompliance with antiretroviral therapy. On his initial physical examination blood pressure 164/110, heart rate 96, temperature 98.2, respiratory 24, oxygen saturation 93% on supplemental oxygen. He was noted to be in respiratory distress, increased work of breathing, his lungs had decreased breath sounds bilaterally, scattered rails, wheezing, bilaterally. Heart S1-S2 present and rhythmic, the abdomen was soft nontender, no lower extremity edema. Chest x-ray with faint opacity of the right lower lobe and left upper lobe. Chest CT with bilateral groundglass opacities, more predominantly on the left upper lobe.   Patient was admitted to hospital working diagnosis acute hypoxic respiratory failure due to community-acquired pneumonia in the setting of immunosuppression, due to HIV   Assessment & Plan:   Principal Problem:   Acute respiratory distress Active Problems:   DM2 (diabetes mellitus, type 2) (HCC)   HIV (human immunodeficiency virus infection) (HCC)   HTN (hypertension)   Chronic systolic heart failure (HCC)   Pneumonia   Alcohol abuse with alcohol-induced mood disorder (HCC)   1. Acute hypoxic respiratory failure. Will continue oxymetry monitoring and supplemental 02 per [place]. Patient reports having persistent dyspnea, oxygen saturation is 98% on 2 LPM. Will continue bronchodilator therapy with douneb q 6 hours and as needed q 2 hours. Respiratory virus panel negative, will dc droplet precautions.   2. Multifocal pneumonia. Will  continue antibiotic therapy to cover Pneumocystis J, with IV bactrim and systemic steroids. Will order incentive sputum to obtain culture sample.   3. HIV. Continue antiretroviral therapy with dolutegravir, descovy. CD 4 at 150 and 22%.   4. Type 2 diabetes mellitus. Will continue insulin sliding scale for glucose cover and monitoring, capillary glucose 281, 186, 369, 294, 240, will continue to calculate requirements before using basal dosing.    DVT prophylaxis: heparin  Code Status: full Family Communication:  Disposition Plan:    Consultants:     Procedures:     Antimicrobials:       Subjective: Patient with persistent dyspnea, mild improved but not resolved, associated with cough and generalized malaise, no fever of chills. No nausea or vomiting.   Objective: Vitals:   03/13/17 1332 03/13/17 1624 03/13/17 2055 03/14/17 0048  BP: 137/80 136/81 123/79 133/75  Pulse: 85 89 80 72  Resp: 18  18 20   Temp: 98 F (36.7 C) 98.4 F (36.9 C) 98 F (36.7 C) 98.3 F (36.8 C)  TempSrc: Oral Oral Oral Oral  SpO2: 100% 97% 98% 94%  Weight:      Height:        Intake/Output Summary (Last 24 hours) at 03/14/2017 0841 Last data filed at 03/14/2017 6553 Gross per 24 hour  Intake 2351.1 ml  Output 3450 ml  Net -1098.9 ml   Filed Weights   03/12/17 1320  Weight: 90.7 kg (200 lb)    Examination:   General: Not in pain or dyspnea, deconditioned Neurology: Awake and alert, non focal  E ENT: mild pallor, no icterus, oral mucosa moist Cardiovascular: No JVD. S1-S2 present, rhythmic, no gallops, rubs, or murmurs. No lower extremity edema. Pulmonary: decreased breath sounds bilaterally, poor air movement,  scattered epiratpry wheezing and  Rhonchi, no frank rales. Gastrointestinal. Abdomen protuberant, no organomegaly, non tender, no rebound or guarding Skin. No rashes Musculoskeletal: no joint deformities     Data Reviewed: I have personally reviewed following labs and  imaging studies  CBC: Recent Labs  Lab 03/12/17 1327 03/13/17 0454 03/14/17 0406  WBC 6.3 5.7 8.9  HGB 14.1 13.1 12.6*  HCT 40.9 37.8* 37.2*  MCV 97.8 96.4 96.9  PLT 147* 154 148*   Basic Metabolic Panel: Recent Labs  Lab 03/12/17 1327 03/13/17 0454 03/14/17 0406  NA 129* 128* 127*  K 4.5 5.1 4.7  CL 91* 89* 93*  CO2 25 28 23   GLUCOSE 490* 452* 344*  BUN 11 16 24*  CREATININE 1.04 1.29* 1.20  CALCIUM 8.8* 8.6* 8.9   GFR: Estimated Creatinine Clearance: 84.7 mL/min (by C-G formula based on SCr of 1.2 mg/dL). Liver Function Tests: Recent Labs  Lab 03/12/17 1448  AST 36  ALT 42  ALKPHOS 95  BILITOT 0.5  PROT 7.8  ALBUMIN 3.5   No results for input(s): LIPASE, AMYLASE in the last 168 hours. No results for input(s): AMMONIA in the last 168 hours. Coagulation Profile: No results for input(s): INR, PROTIME in the last 168 hours. Cardiac Enzymes: Recent Labs  Lab 03/12/17 1448  TROPONINI 0.09*   BNP (last 3 results) No results for input(s): PROBNP in the last 8760 hours. HbA1C: Recent Labs    03/13/17 0454  HGBA1C 10.8*   CBG: Recent Labs  Lab 03/13/17 1145 03/13/17 1342 03/13/17 1622 03/13/17 2236 03/14/17 0809  GLUCAP 435* 281* 186* 369* 294*   Lipid Profile: No results for input(s): CHOL, HDL, LDLCALC, TRIG, CHOLHDL, LDLDIRECT in the last 72 hours. Thyroid Function Tests: No results for input(s): TSH, T4TOTAL, FREET4, T3FREE, THYROIDAB in the last 72 hours. Anemia Panel: No results for input(s): VITAMINB12, FOLATE, FERRITIN, TIBC, IRON, RETICCTPCT in the last 72 hours.    Radiology Studies: I have reviewed all of the imaging during this hospital visit personally     Scheduled Meds: . aspirin EC  81 mg Oral Daily  . atorvastatin  20 mg Oral Daily  . carvedilol  25 mg Oral BID  . dolutegravir  50 mg Oral Daily  . emtricitabine-tenofovir AF  1 tablet Oral Daily  . guaiFENesin  600 mg Oral BID  . heparin  5,000 Units Subcutaneous Q8H    . hydrALAZINE  100 mg Oral TID  . insulin aspart  0-20 Units Subcutaneous TID WC  . insulin aspart  0-5 Units Subcutaneous QHS  . insulin glargine  10 Units Subcutaneous Daily  . ipratropium-albuterol  3 mL Nebulization TID  . isosorbide mononitrate  60 mg Oral Daily  . predniSONE  40 mg Oral BID WC  . senna  1 tablet Oral BID  . sodium chloride flush  3 mL Intravenous Q12H   Continuous Infusions: . sodium chloride    . levofloxacin (LEVAQUIN) IV Stopped (03/13/17 1857)  . sulfamethoxazole-trimethoprim Stopped (03/14/17 0330)     LOS: 2 days        Mauricio Annett Gulaaniel Arrien, MD [facility] Pager [phone]

## 2017-03-15 DIAGNOSIS — F1014 Alcohol abuse with alcohol-induced mood disorder: Secondary | ICD-10-CM

## 2017-03-15 LAB — BASIC METABOLIC PANEL
ANION GAP: 10 (ref 5–15)
BUN: 29 mg/dL — ABNORMAL HIGH (ref 6–20)
CHLORIDE: 90 mmol/L — AB (ref 101–111)
CO2: 23 mmol/L (ref 22–32)
Calcium: 9.3 mg/dL (ref 8.9–10.3)
Creatinine, Ser: 1.51 mg/dL — ABNORMAL HIGH (ref 0.61–1.24)
GFR calc non Af Amer: 56 mL/min — ABNORMAL LOW (ref >60)
Glucose, Bld: 543 mg/dL (ref 65–99)
POTASSIUM: 5.2 mmol/L — AB (ref 3.5–5.1)
SODIUM: 123 mmol/L — AB (ref 135–145)

## 2017-03-15 LAB — CBC WITH DIFFERENTIAL/PLATELET
Basophils Absolute: 0 10*3/uL (ref 0.0–0.1)
Basophils Relative: 0 %
EOS ABS: 0 10*3/uL (ref 0.0–0.7)
Eosinophils Relative: 0 %
HCT: 37 % — ABNORMAL LOW (ref 39.0–52.0)
Hemoglobin: 12.6 g/dL — ABNORMAL LOW (ref 13.0–17.0)
LYMPHS ABS: 0.9 10*3/uL (ref 0.7–4.0)
LYMPHS PCT: 11 %
MCH: 32.8 pg (ref 26.0–34.0)
MCHC: 34.1 g/dL (ref 30.0–36.0)
MCV: 96.4 fL (ref 78.0–100.0)
Monocytes Absolute: 0.6 10*3/uL (ref 0.1–1.0)
Monocytes Relative: 7 %
NEUTROS PCT: 82 %
Neutro Abs: 6.8 10*3/uL (ref 1.7–7.7)
Platelets: 157 10*3/uL (ref 150–400)
RBC: 3.84 MIL/uL — AB (ref 4.22–5.81)
RDW: 12.9 % (ref 11.5–15.5)
WBC: 8.2 10*3/uL (ref 4.0–10.5)

## 2017-03-15 LAB — GLUCOSE, CAPILLARY
GLUCOSE-CAPILLARY: 490 mg/dL — AB (ref 65–99)
GLUCOSE-CAPILLARY: 219 mg/dL — AB (ref 65–99)
GLUCOSE-CAPILLARY: 390 mg/dL — AB (ref 65–99)
Glucose-Capillary: 354 mg/dL — ABNORMAL HIGH (ref 65–99)
Glucose-Capillary: 265 mg/dL — ABNORMAL HIGH (ref 65–99)
Glucose-Capillary: 297 mg/dL — ABNORMAL HIGH (ref 65–99)

## 2017-03-15 MED ORDER — INSULIN DETEMIR 100 UNIT/ML ~~LOC~~ SOLN
5.0000 [IU] | Freq: Once | SUBCUTANEOUS | Status: AC
  Administered 2017-03-15: 5 [IU] via SUBCUTANEOUS
  Filled 2017-03-15: qty 0.05

## 2017-03-15 MED ORDER — SULFAMETHOXAZOLE-TRIMETHOPRIM 800-160 MG PO TABS
2.0000 | ORAL_TABLET | Freq: Three times a day (TID) | ORAL | Status: DC
  Administered 2017-03-15 – 2017-03-20 (×16): 2 via ORAL
  Filled 2017-03-15 (×17): qty 2

## 2017-03-15 MED ORDER — LEVOFLOXACIN 750 MG PO TABS
750.0000 mg | ORAL_TABLET | ORAL | Status: AC
  Administered 2017-03-15 – 2017-03-18 (×4): 750 mg via ORAL
  Filled 2017-03-15 (×4): qty 1

## 2017-03-15 MED ORDER — INSULIN GLARGINE 100 UNIT/ML ~~LOC~~ SOLN
15.0000 [IU] | Freq: Two times a day (BID) | SUBCUTANEOUS | Status: DC
  Administered 2017-03-15 – 2017-03-16 (×2): 15 [IU] via SUBCUTANEOUS
  Filled 2017-03-15 (×3): qty 0.15

## 2017-03-15 MED ORDER — SODIUM CHLORIDE 3 % IN NEBU
4.0000 mL | INHALATION_SOLUTION | Freq: Two times a day (BID) | RESPIRATORY_TRACT | Status: AC
  Administered 2017-03-15 – 2017-03-17 (×4): 4 mL via RESPIRATORY_TRACT
  Filled 2017-03-15 (×6): qty 4

## 2017-03-15 NOTE — Progress Notes (Addendum)
PROGRESS NOTE    Shaun Brennan  RUE:[id number] DOB: 1975/06/14 DOA: 03/12/2017 PCP: Patient, No Pcp Per    Brief Narrative:  41 year old male who presented with dyspnea. Patient does have significant past medical history of HIV, type 2 diabetes mellitus, systolic heart failure, and hypertension. Complaint of 48 hours of her tic cough, chills, diaphoresis,chest pain and dyspnea. Worsening symptoms over last 24 hours prior to hospitalization. Questionable noncompliance with antiretroviral therapy. On his initial physical examination blood pressure 164/110, heart rate 96, temperature 98.2, respiratory 24, oxygen saturation 93% on supplemental oxygen. He was noted to be in respiratory distress, increased work of breathing, his lungs had decreased breath sounds bilaterally, scattered rails, wheezing, bilaterally. Heart S1-S2 present and rhythmic, the abdomen was soft nontender, no lower extremity edema. Chest x-ray with faint opacity of the right lower lobe and left upper lobe. Chest CT with bilateral groundglass opacities, more predominantly on the left upper lobe.   Patient was admitted to hospital working diagnosis acute hypoxic respiratory failure due to community-acquired pneumonia in the setting of immunosuppression, due to HIV     Assessment & Plan:   Principal Problem:   Acute respiratory distress Active Problems:   DM2 (diabetes mellitus, type 2) (HCC)   HIV (human immunodeficiency virus infection) (HCC)   HTN (hypertension)   Chronic systolic heart failure (HCC)   Pneumonia   Alcohol abuse with alcohol-induced mood disorder (HCC)   AIDS (HCC)   1. Acute hypoxic respiratory failure. Patient continue to be dyspneic, will continue oxymetry monitoring and supplemental 02 per [place]. On 2,5 LPM of nasal cannula with oxygen saturation 100%. Continue bronchodilator therapy with douneb q 6 hours and as needed q 2 hours.   2. Multifocal pneumonia. Antibiotic therapy with IV Bactrim plus  levofloxacin for suspected pneumocystis J. So far no sputum sample, I discuss the case with pulmonary over the phone and will order saline induced sputum, if no successful sampling patient may need diagnostic bronchoscopy. Noted rise in serum creatinine likely reflective of Bactrim decreasing cr secretion, and not true decreased on GFR.    3. HIV/ AIDS CD 4 at 150 and 22%. Antiretroviral therapy with dolutegravir, descovy. Per ID recommendations.   4. Type 2 diabetes mellitus. Uncontrolled hyperglycemia, likely exacerbated by systemic steroids, capillary glucose 490, 354, 219, 265, 297. Will continue 15 units of glargine bid, further titration in the next 24 hours.   5. HTN. Will continue carvedilol, hydralazine and isosorbide for blood pressure control, systolic blood pressure 120 to 140 systolic.   DVT prophylaxis: heparin  Code Status: full Family Communication:  Disposition Plan:    Consultants:     Procedures:     Antimicrobials:       Subjective: Patient with mild improvement in his symptoms, but continue to have dyspnea, has been not able to give sputum sample, no nausea or vomiting.   Objective: Vitals:   03/15/17 0210 03/15/17 0427 03/15/17 0814 03/15/17 0845  BP:  139/90 132/88 (!) 142/103  Pulse:  82 79   Resp:  17 18   Temp:  98.3 F (36.8 C) 98.1 F (36.7 C)   TempSrc:  Oral Oral   SpO2: 98% 99% 99%   Weight:  90.6 kg (199 lb 11.2 oz)    Height:        Intake/Output Summary (Last 24 hours) at 03/15/2017 0948 Last data filed at 03/15/2017 0428 Gross per 24 hour  Intake 2038.6 ml  Output 4380 ml  Net -2341.4 ml   Ceasar Mons  Weights   03/12/17 1320 03/15/17 0427  Weight: 90.7 kg (200 lb) 90.6 kg (199 lb 11.2 oz)    Examination:   General: Not in pain or dyspnea, deconditioned Neurology: Awake and alert, non focal  E ENT: mild pallor, no icterus, oral mucosa moist Cardiovascular: No JVD. S1-S2 present, rhythmic, no gallops, rubs, or murmurs.  No lower extremity edema. Pulmonary: vesicular breath sounds bilaterally, adequate air movement, no wheezing, scattered rhonchi and rales. Gastrointestinal. Abdomen protuberant, no organomegaly, non tender, no rebound or guarding Skin. No rashes Musculoskeletal: no joint deformities     Data Reviewed: I have personally reviewed following labs and imaging studies  CBC: Recent Labs  Lab 03/12/17 1327 03/13/17 0454 03/14/17 0406 03/15/17 0523  WBC 6.3 5.7 8.9 8.2  NEUTROABS  --   --   --  6.8  HGB 14.1 13.1 12.6* 12.6*  HCT 40.9 37.8* 37.2* 37.0*  MCV 97.8 96.4 96.9 96.4  PLT 147* 154 148* 157   Basic Metabolic Panel: Recent Labs  Lab 03/12/17 1327 03/13/17 0454 03/14/17 0406 03/15/17 0523  NA 129* 128* 127* 123*  K 4.5 5.1 4.7 5.2*  CL 91* 89* 93* 90*  CO2 25 28 23 23   GLUCOSE 490* 452* 344* 543*  BUN 11 16 24* 29*  CREATININE 1.04 1.29* 1.20 1.51*  CALCIUM 8.8* 8.6* 8.9 9.3   GFR: Estimated Creatinine Clearance: 67.2 mL/min (A) (by C-G formula based on SCr of 1.51 mg/dL (H)). Liver Function Tests: Recent Labs  Lab 03/12/17 1448  AST 36  ALT 42  ALKPHOS 95  BILITOT 0.5  PROT 7.8  ALBUMIN 3.5   No results for input(s): LIPASE, AMYLASE in the last 168 hours. No results for input(s): AMMONIA in the last 168 hours. Coagulation Profile: No results for input(s): INR, PROTIME in the last 168 hours. Cardiac Enzymes: Recent Labs  Lab 03/12/17 1448  TROPONINI 0.09*   BNP (last 3 results) No results for input(s): PROBNP in the last 8760 hours. HbA1C: Recent Labs    03/13/17 0454  HGBA1C 10.8*   CBG: Recent Labs  Lab 03/14/17 1635 03/14/17 2105 03/15/17 0644 03/15/17 0743 03/15/17 0943  GLUCAP 400* 301* 490* 354* 219*   Lipid Profile: No results for input(s): CHOL, HDL, LDLCALC, TRIG, CHOLHDL, LDLDIRECT in the last 72 hours. Thyroid Function Tests: No results for input(s): TSH, T4TOTAL, FREET4, T3FREE, THYROIDAB in the last 72 hours. Anemia  Panel: No results for input(s): VITAMINB12, FOLATE, FERRITIN, TIBC, IRON, RETICCTPCT in the last 72 hours.    Radiology Studies: I have reviewed all of the imaging during this hospital visit personally     Scheduled Meds: . aspirin EC  81 mg Oral Daily  . atorvastatin  20 mg Oral Daily  . carvedilol  25 mg Oral BID  . dolutegravir  50 mg Oral Daily  . emtricitabine-tenofovir AF  1 tablet Oral Daily  . guaiFENesin  600 mg Oral BID  . heparin  5,000 Units Subcutaneous Q8H  . hydrALAZINE  100 mg Oral TID  . insulin aspart  0-20 Units Subcutaneous TID WC  . insulin aspart  0-5 Units Subcutaneous QHS  . insulin glargine  10 Units Subcutaneous Daily  . ipratropium-albuterol  3 mL Nebulization Q6H  . isosorbide mononitrate  60 mg Oral Daily  . levofloxacin  750 mg Oral Q24H  . predniSONE  40 mg Oral BID WC  . senna  1 tablet Oral BID  . sodium chloride flush  3 mL Intravenous Q12H  .  sulfamethoxazole-trimethoprim  2 tablet Oral TID   Continuous Infusions: . sodium chloride       LOS: 3 days        Shaun Annett Gulaaniel Arrien, Shaun Brennan [facility] Pager [phone]

## 2017-03-15 NOTE — Progress Notes (Signed)
INFECTIOUS DISEASE PROGRESS NOTE  ID: Shaun Brennan is a 41 y.o. male with  Principal Problem:   Acute respiratory distress Active Problems:   DM2 (diabetes mellitus, type 2) (HCC)   HIV (human immunodeficiency virus infection) (HCC)   HTN (hypertension)   Chronic systolic heart failure (HCC)   Pneumonia   Alcohol abuse with alcohol-induced mood disorder (HCC)   AIDS (HCC)  Subjective: No complaints, occas cough.  Has not ambulated except to bathroom.  sats normal on RA  Abtx:  Anti-infectives (From admission, onward)   Start     Dose/Rate Route Frequency Ordered Stop   03/15/17 1800  levofloxacin (LEVAQUIN) tablet 750 mg     750 mg Oral Every 24 hours 03/15/17 0859     03/15/17 1000  sulfamethoxazole-trimethoprim (BACTRIM DS,SEPTRA DS) 800-160 MG per tablet 2 tablet     2 tablet Oral 3 times daily 03/15/17 0857     03/13/17 0200  sulfamethoxazole-trimethoprim (BACTRIM) 452.8 mg in dextrose 5 % 500 mL IVPB  Status:  Discontinued     15 mg/kg/day  90.7 kg 352.2 mL/hr over 90 Minutes Intravenous Every 8 hours 03/12/17 1824 03/15/17 0856   03/12/17 1800  levofloxacin (LEVAQUIN) IVPB 750 mg  Status:  Discontinued     750 mg 100 mL/hr over 90 Minutes Intravenous Every 24 hours 03/12/17 1730 03/15/17 0858   03/12/17 1730  sulfamethoxazole-trimethoprim (BACTRIM) 452.8 mg in dextrose 5 % 500 mL IVPB     5 mg/kg  90.7 kg 352.2 mL/hr over 90 Minutes Intravenous  Once 03/12/17 1625 03/12/17 1859   03/12/17 1730  dolutegravir (TIVICAY) tablet 50 mg     50 mg Oral Daily 03/12/17 1719     03/12/17 1730  emtricitabine-tenofovir AF (DESCOVY) 200-25 MG per tablet 1 tablet     1 tablet Oral Daily 03/12/17 1719        Medications:  Scheduled: . aspirin EC  81 mg Oral Daily  . atorvastatin  20 mg Oral Daily  . carvedilol  25 mg Oral BID  . dolutegravir  50 mg Oral Daily  . emtricitabine-tenofovir AF  1 tablet Oral Daily  . guaiFENesin  600 mg Oral BID  . heparin  5,000 Units  Subcutaneous Q8H  . hydrALAZINE  100 mg Oral TID  . insulin aspart  0-20 Units Subcutaneous TID WC  . insulin aspart  0-5 Units Subcutaneous QHS  . insulin glargine  15 Units Subcutaneous BID  . ipratropium-albuterol  3 mL Nebulization Q6H  . isosorbide mononitrate  60 mg Oral Daily  . levofloxacin  750 mg Oral Q24H  . predniSONE  40 mg Oral BID WC  . senna  1 tablet Oral BID  . sodium chloride flush  3 mL Intravenous Q12H  . sodium chloride HYPERTONIC  4 mL Nebulization BID  . sulfamethoxazole-trimethoprim  2 tablet Oral TID    Objective: Vital signs in last 24 hours: Temp:  [98.1 F (36.7 C)-98.3 F (36.8 C)] 98.3 F (36.8 C) (12/06 1100) Pulse Rate:  [79-87] 81 (12/06 1100) Resp:  [17-20] 18 (12/06 1100) BP: (118-142)/(82-103) 142/103 (12/06 0845) SpO2:  [97 %-100 %] 98 % (12/06 1100) Weight:  [90.6 kg (199 lb 11.2 oz)] 90.6 kg (199 lb 11.2 oz) (12/06 0427)   General appearance: alert, cooperative and no distress Resp: rhonchi RUL Cardio: regular rate and rhythm GI: normal findings: bowel sounds normal and soft, non-tender Extremities: edema none  Lab Results Recent Labs    03/14/17 0406 03/15/17 16100523  WBC 8.9 8.2  HGB 12.6* 12.6*  HCT 37.2* 37.0*  NA 127* 123*  K 4.7 5.2*  CL 93* 90*  CO2 23 23  BUN 24* 29*  CREATININE 1.20 1.51*   Liver Panel Recent Labs    03/12/17 1448  PROT 7.8  ALBUMIN 3.5  AST 36  ALT 42  ALKPHOS 95  BILITOT 0.5  BILIDIR <0.1*  IBILI NOT CALCULATED   Sedimentation Rate No results for input(s): ESRSEDRATE in the last 72 hours. C-Reactive Protein No results for input(s): CRP in the last 72 hours.  Microbiology: Recent Results (from the past 240 hour(s))  Blood culture (routine x 2)     Status: None (Preliminary result)   Collection Time: 03/12/17  2:49 PM  Result Value Ref Range Status   Specimen Description BLOOD RIGHT ANTECUBITAL  Final   Special Requests   Final    BOTTLES DRAWN AEROBIC AND ANAEROBIC Blood Culture  adequate volume   Culture NO GROWTH 3 DAYS  Final   Report Status PENDING  Incomplete  Blood culture (routine x 2)     Status: None (Preliminary result)   Collection Time: 03/12/17  2:50 PM  Result Value Ref Range Status   Specimen Description BLOOD LEFT ANTECUBITAL  Final   Special Requests   Final    BOTTLES DRAWN AEROBIC AND ANAEROBIC Blood Culture adequate volume   Culture NO GROWTH 3 DAYS  Final   Report Status PENDING  Incomplete  MRSA PCR Screening     Status: None   Collection Time: 03/13/17  2:02 PM  Result Value Ref Range Status   MRSA by PCR NEGATIVE NEGATIVE Final    Comment:        The GeneXpert MRSA Assay (FDA approved for NASAL specimens only), is one component of a comprehensive MRSA colonization surveillance program. It is not intended to diagnose MRSA infection nor to guide or monitor treatment for MRSA infections.   Respiratory Panel by PCR     Status: None   Collection Time: 03/13/17  3:15 PM  Result Value Ref Range Status   Adenovirus NOT DETECTED NOT DETECTED Final   Coronavirus 229E NOT DETECTED NOT DETECTED Final   Coronavirus HKU1 NOT DETECTED NOT DETECTED Final   Coronavirus NL63 NOT DETECTED NOT DETECTED Final   Coronavirus OC43 NOT DETECTED NOT DETECTED Final   Metapneumovirus NOT DETECTED NOT DETECTED Final   Rhinovirus / Enterovirus NOT DETECTED NOT DETECTED Final   Influenza A NOT DETECTED NOT DETECTED Final   Influenza B NOT DETECTED NOT DETECTED Final   Parainfluenza Virus 1 NOT DETECTED NOT DETECTED Final   Parainfluenza Virus 2 NOT DETECTED NOT DETECTED Final   Parainfluenza Virus 3 NOT DETECTED NOT DETECTED Final   Parainfluenza Virus 4 NOT DETECTED NOT DETECTED Final   Respiratory Syncytial Virus NOT DETECTED NOT DETECTED Final   Bordetella pertussis NOT DETECTED NOT DETECTED Final   Chlamydophila pneumoniae NOT DETECTED NOT DETECTED Final   Mycoplasma pneumoniae NOT DETECTED NOT DETECTED Final    Studies/Results: No results  found.   Assessment/Plan: AIDS PCP/PJP ? CHF  He has put out 7.3L since adm TTE EF 30-35% PCP stain not done (yet) Now on po anbx Glc still irregular.  Could stop levquin at day 7  Total days of antibiotics: 3 bactrim/levaquin/prednisone         Shaun Sax MD, FACP Infectious Diseases (pager) [phone] [email or web address] 03/15/2017, 2:43 PM  LOS: 3 days

## 2017-03-15 NOTE — Progress Notes (Signed)
Inpatient Diabetes Program Recommendations  AACE/ADA: New Consensus Statement on Inpatient Glycemic Control (2015)  Target Ranges:  Prepandial:   less than 140 mg/dL      Peak postprandial:   less than 180 mg/dL (1-2 hours)      Critically ill patients:  140 - 180 mg/dL   Lab Results  Component Value Date   GLUCAP 297 (H) 03/15/2017   HGBA1C 10.8 (H) 03/13/2017    Review of Glycemic Control  Diabetes history: DM2 Outpatient Diabetes medications: Not taking: 70/30 15 units bid, metformin 1000 mg bid Current orders for Inpatient glycemic control: Lantus 15 units bid, Novolog 0-20 units tidwc and hs   Inpatient Diabetes Program Recommendations:    Decrease Lantus to 12 units bid Add Novolog 5 units tidwc if pt eats > 50% meal.  To follow up at [facility].  Thank you. Ailene Ards, RD, LDN, CDE Inpatient Diabetes Coordinator [phone]

## 2017-03-15 NOTE — Progress Notes (Signed)
Pharmacy Antibiotic Note Shaun Brennan is a 41 y.o. male admitted on 03/12/2017 with concern for PJP vs CAP in setting of HIV. Currently on day 4 of IV Bactrim and Levaquin for treatment.   Per ID recommendations on 12/5 ok to change Levaquin and Bactrim to PO. IV Bactrim likely contributing to hyponatremia and hyperglycemia since it is administered in 500 ml D5W with each dose (1500 ml total per day). Hyperkalemia persists but stable. If continues to increase will likely require alternative treatment regimen for PJP.    Plan: 1. Change Bactrim to 2 DS tablets TID  2. Change Levaquin to 750 mg PO every 24 hours  3. Follow up SCr and K   Height: 5\' 5"  (165.1 cm) Weight: 199 lb 11.2 oz (90.6 kg) IBW/kg (Calculated) : 61.5  Temp (24hrs), Avg:98 F (36.7 C), Min:97.5 F (36.4 C), Max:98.3 F (36.8 C)  Recent Labs  Lab 03/12/17 1327 03/12/17 1506 03/12/17 2043 03/13/17 0454 03/14/17 0406 03/15/17 0523  WBC 6.3  --   --  5.7 8.9 8.2  CREATININE 1.04  --   --  1.29* 1.20 1.51*  LATICACIDVEN  --  3.15* 1.61  --   --   --     Estimated Creatinine Clearance: 67.2 mL/min (A) (by C-G formula based on SCr of 1.51 mg/dL (H)).    Allergies  Allergen Reactions  . Lisinopril Cough    Thank you for allowing pharmacy to be a part of this patient's care.  Pollyann Samples, PharmD, BCPS 03/15/2017, 9:04 AM

## 2017-03-15 NOTE — Clinical Social Work Note (Signed)
Clinical Social Work Assessment  Patient Details  Name: Shaun Brennan MRN: [id number] Date of Birth: 1975-08-21  Date of referral:  03/15/17               Reason for consult:  [employer], Housing Concerns/Homelessness                Permission sought to share information with:  Family Supports Permission granted to share information::  Yes, Verbal Permission Granted  Name::     [place]  Agency::     Relationship::  significant other  Contact Information:  [phone]  Housing/Transportation Living arrangements for the past 2 months:  Homeless Source of Information:  Patient, Partner Patient Interpreter Needed:  None Criminal Activity/Legal Involvement Pertinent to Current Situation/Hospitalization:  No - Comment as needed Significant Relationships:  Significant Other Lives with:  Significant Other Do you feel safe going back to the place where you live?    Need for family participation in patient care:  No (Coment)  Care giving concerns: Patient is homeless.  Social Worker assessment / plan: Patient's partner requested to speak to CSW regarding meal vouchers. CSW met with patient and partner at bedside. Patient and partner reported they are homeless and have been staying either with friends or at shelters. Patient familiar with [employer] and their emergency shelter. CSW provided list of shelters and free meals in the community. CSW provided meal voucher for partner, as they indicated they do not have any money right now for food. Patient and partner will also need transportation assistance at discharge. CSW to provide bus passes when patient ready for discharge.  Employment status:  Other (Comment)(unknown) Insurance information:  Self Pay (Medicaid Pending) PT Recommendations:  Not assessed at this time Information / Referral to community resources:  Shelter, Other (Comment Required)(IRC)  Patient/Family's Response to care: Patient and partner  appreciative of care and resources.  Patient/Family's Understanding of and Emotional Response to Diagnosis, Current Treatment, and Prognosis: Patient with understanding of his conditions.  Emotional Assessment Appearance:  Appears stated age Attitude/Demeanor/Rapport:  Other(appropriate) Affect (typically observed):  Calm Orientation:  Oriented to Self, Oriented to Place, Oriented to Situation, Oriented to  Time Alcohol / Substance use:  Not Applicable Psych involvement (Current and /or in the community):  No (Comment)  Discharge Needs  Concerns to be addressed:  Homelessness Readmission within the last 30 days:  No Current discharge risk:  Homeless Barriers to Discharge:  Continued Medical Work up   Estanislado Emms, LCSW 03/15/2017, 3:20 PM

## 2017-03-15 NOTE — Care Management Note (Addendum)
Case Management Note  Patient Details  Name: Shaun Brennan MRN: [id number] Date of Birth: [birth year]  Subjective/Objective:     Acute resp distress, PNA, DM, HTN             Action/Plan: Discharge Planning: NCM spoke to pt and significant other, Jeffrey at bedside. Pt goes to the [facility] for follow up and able to get his meds with no copay cost. Requested cane for home. Contacted [facility] for cane to be delivered to room. Scheduled appt at [facility] for 03/28/2017 at 1030 am. CSW following for homelessness needs.   [facility] delivered cane to room.    Expected Discharge Date:               Expected Discharge Plan:  Home/Self Care  In-House Referral:  Clinical Social Work  Discharge planning Services  CM Consult  Post Acute Care Choice:  NA Choice offered to:  NA  DME Arranged:  [place] DME Agency:  [facility]  HH Arranged:  NA HH Agency:  NA  Status of Service:  Completed, signed off  If discussed at Long Length of Stay Meetings, dates discussed:    Additional Comments:  Elliot Cousin, RN 03/15/2017, 3:20 PM

## 2017-03-15 NOTE — Progress Notes (Signed)
Lab glucose resulted 543 this AM. I obtained a CBG once notified by lab: 490. CO2 and anion gap on morning BMET WNL. [place] notified. Verbal order for 20 units (am meal coverage) now.

## 2017-03-16 DIAGNOSIS — J189 Pneumonia, unspecified organism: Secondary | ICD-10-CM

## 2017-03-16 LAB — BASIC METABOLIC PANEL
Anion gap: 10 (ref 5–15)
BUN: 33 mg/dL — AB (ref 6–20)
CO2: 20 mmol/L — ABNORMAL LOW (ref 22–32)
CREATININE: 1.52 mg/dL — AB (ref 0.61–1.24)
Calcium: 9.4 mg/dL (ref 8.9–10.3)
Chloride: 95 mmol/L — ABNORMAL LOW (ref 101–111)
GFR calc Af Amer: >60 mL/min (ref >60)
GFR, EST NON AFRICAN AMERICAN: 56 mL/min — AB (ref >60)
Glucose, Bld: 383 mg/dL — ABNORMAL HIGH (ref 65–99)
Potassium: 4.6 mmol/L (ref 3.5–5.1)
SODIUM: 125 mmol/L — AB (ref 135–145)

## 2017-03-16 LAB — CBC WITH DIFFERENTIAL/PLATELET
BASOS ABS: 0 10*3/uL (ref 0.0–0.1)
Basophils Relative: 0 %
EOS ABS: 0 10*3/uL (ref 0.0–0.7)
EOS PCT: 0 %
HCT: 39.5 % (ref 39.0–52.0)
Hemoglobin: 13.5 g/dL (ref 13.0–17.0)
Lymphocytes Relative: 10 %
Lymphs Abs: 0.8 10*3/uL (ref 0.7–4.0)
MCH: 33.3 pg (ref 26.0–34.0)
MCHC: 34.2 g/dL (ref 30.0–36.0)
MCV: 97.3 fL (ref 78.0–100.0)
Monocytes Absolute: 0.7 10*3/uL (ref 0.1–1.0)
Monocytes Relative: 9 %
Neutro Abs: 6.5 10*3/uL (ref 1.7–7.7)
Neutrophils Relative %: 81 %
PLATELETS: 158 10*3/uL (ref 150–400)
RBC: 4.06 MIL/uL — AB (ref 4.22–5.81)
RDW: 13.3 % (ref 11.5–15.5)
WBC: 8 10*3/uL (ref 4.0–10.5)

## 2017-03-16 LAB — GLUCOSE, CAPILLARY
GLUCOSE-CAPILLARY: 317 mg/dL — AB (ref 65–99)
GLUCOSE-CAPILLARY: 217 mg/dL — AB (ref 65–99)
Glucose-Capillary: 406 mg/dL — ABNORMAL HIGH (ref 65–99)
Glucose-Capillary: 353 mg/dL — ABNORMAL HIGH (ref 65–99)

## 2017-03-16 MED ORDER — INSULIN ASPART 100 UNIT/ML ~~LOC~~ SOLN
14.0000 [IU] | Freq: Once | SUBCUTANEOUS | Status: AC
  Administered 2017-03-16: 14 [IU] via SUBCUTANEOUS

## 2017-03-16 MED ORDER — IPRATROPIUM-ALBUTEROL 0.5-2.5 (3) MG/3ML IN SOLN
3.0000 mL | Freq: Two times a day (BID) | RESPIRATORY_TRACT | Status: DC
Start: 2017-03-16 — End: 2017-03-18
  Administered 2017-03-16 – 2017-03-17 (×4): 3 mL via RESPIRATORY_TRACT
  Filled 2017-03-16 (×5): qty 3

## 2017-03-16 MED ORDER — INSULIN GLARGINE 100 UNIT/ML ~~LOC~~ SOLN
20.0000 [IU] | Freq: Two times a day (BID) | SUBCUTANEOUS | Status: DC
  Administered 2017-03-16 – 2017-03-20 (×8): 20 [IU] via SUBCUTANEOUS
  Filled 2017-03-16 (×9): qty 0.2

## 2017-03-16 MED ORDER — GUAIFENESIN-DM 100-10 MG/5ML PO SYRP
10.0000 mL | ORAL_SOLUTION | Freq: Four times a day (QID) | ORAL | Status: DC
  Administered 2017-03-16 – 2017-03-19 (×12): 10 mL via ORAL
  Filled 2017-03-16 (×13): qty 10

## 2017-03-16 NOTE — Progress Notes (Signed)
Inpatient Diabetes Program Recommendations  AACE/ADA: New Consensus Statement on Inpatient Glycemic Control (2015)  Target Ranges:  Prepandial:   less than 140 mg/dL      Peak postprandial:   less than 180 mg/dL (1-2 hours)      Critically ill patients:  140 - 180 mg/dL   Lab Results  Component Value Date   GLUCAP 217 (H) 03/16/2017   HGBA1C 10.8 (H) 03/13/2017    Review of Glycemic Control  CBGs > 180 mg/dL Needs insulin adjustment.  Inpatient Diabetes Program Recommendations:     Increase Lantus 18 units bid Add Novolog 5 units tidwc  Continue to follow.  Thank you. Ailene Ards, RD, LDN, CDE Inpatient Diabetes Coordinator [phone]

## 2017-03-16 NOTE — Consult Note (Signed)
PULMONARY / CRITICAL CARE MEDICINE   Name: Shaun Brennan MRN: [id number] DOB: 12/26/1975    ADMISSION DATE:  03/12/2017 CONSULTATION DATE:  Mar 12 2017  REFERRING MD:  Arrien  CHIEF COMPLAINT:  Dyspnea  HISTORY OF PRESENT ILLNESS:   41 y/o male with a past medical history of HIV who states that he takes his medications for the same presented on 03/12/2017 with dyspnea, cough.  He stated that he had been having symptoms since Sunday 1 week ago.  He said that he had no fever but he did have some chills.  He had a cough which was not productive.  He had some chest pain but he denied any other symptoms.  He denied any body aches nausea vomiting or headache.  He denied any sick contacts.  He says he smokes cigarettes.  He says that he takes his HIV medicines regularly.  He was admitted here and attempts were made to obtain a sample of mucus from him but unfortunately none could be obtained.  He has been given hypertonic saline and this did not help.  Pulmonary and critical care medicine was consulted for consideration of a bronchoscopy.  PAST MEDICAL HISTORY :  He  has a past medical history of CHF (congestive heart failure) ([place]), Chronic systolic heart failure ([place]), Diabetes mellitus without complication ([place]), DM2 (diabetes mellitus, type 2) ([place]), HIV (human immunodeficiency virus infection) ([place]), and Hypertension.  PAST SURGICAL HISTORY: He  has a past surgical history that includes Ankle surgery ([place]).  Allergies  Allergen Reactions  . Lisinopril Cough    No current facility-administered medications on file prior to encounter.    Current Outpatient Medications on File Prior to Encounter  Medication Sig  . aspirin 81 MG EC tablet Take 1 tablet (81 mg total) by mouth daily. (Patient taking differently: Take 81 mg by mouth daily as needed for pain. )  . atorvastatin (LIPITOR) 20 MG tablet TAKE 1 TABLET BY MOUTH ONCE DAILY (Patient taking differently: TAKE '20MG'$  BY MOUTH ONCE DAILY)  .  blood glucose meter kit and supplies KIT Dispense based on patient and insurance preference. Use up to four times daily as directed. (FOR ICD-9 250.00, 250.01).  . carvedilol (COREG) 25 MG tablet TAKE 1 TABLET BY MOUTH TWICE DAILY (Patient taking differently: TAKE '25MG'$  BY MOUTH TWICE DAILY WITH MEALS)  . DESCOVY 200-25 MG tablet TAKE 1 TABLET BY MOUTH EVERY DAY  . hydrALAZINE (APRESOLINE) 50 MG tablet TAKE 2 TABLETS BY MOUTH 2 TIMES A DAY (Patient taking differently: TAKE '100MG'$  BY MOUTH TWICE DAILY)  . ibuprofen (ADVIL,MOTRIN) 200 MG tablet Take 200 mg by mouth every 6 (six) hours as needed for moderate pain.  . isosorbide mononitrate (IMDUR) 60 MG 24 hr tablet TAKE 1 TABLET BY MOUTH ONCE DAILY (Patient taking differently: TAKE '60MG'$  BY MOUTH ONCE DAILY)  . TIVICAY 50 MG tablet TAKE 1 TABLET BY MOUTH EVERY DAY (Patient taking differently: TAKE '50MG'$  BY MOUTH ONCE DAILY)  . cefdinir (OMNICEF) 300 MG capsule Take 1 capsule (300 mg total) by mouth 2 (two) times daily. Stop taking 6/30 (Patient not taking: Reported on 01/06/2017)  . furosemide (LASIX) 40 MG tablet Take 1 tablet (40 mg total) by mouth 2 (two) times a week. On Monday and Friday (Patient not taking: Reported on 03/12/2017)  . gabapentin (NEURONTIN) 300 MG capsule Take 1 capsule (300 mg total) by mouth 2 (two) times daily. (Patient not taking: Reported on 03/12/2017)  . insulin NPH-regular Human (NOVOLIN 70/30) (70-30) 100 UNIT/ML  injection Inject 15 Units into the skin 2 (two) times daily with a meal. (Patient not taking: Reported on 10/03/2016)  . metFORMIN (GLUCOPHAGE) 500 MG tablet Take 2 tablets (1,000 mg total) by mouth 2 (two) times daily with a meal. (Patient not taking: Reported on 03/12/2017)  . sacubitril-valsartan (ENTRESTO) 49-51 MG Take 1 tablet by mouth 2 (two) times daily. (Patient not taking: Reported on 03/12/2017)    FAMILY HISTORY:  His indicated that his mother is deceased. He reported the following about his father: never knew  father . He indicated that his sister is alive. He indicated that the status of his paternal grandfather is unknown.   SOCIAL HISTORY: He  reports that he has been smoking cigarettes.  He has a 3.75 pack-year smoking history. he has never used smokeless tobacco. He reports that he drinks about 9.0 oz of alcohol per week. He reports that he uses drugs. Drug: Marijuana.  REVIEW OF SYSTEMS:   Gen: Per HPI HEENT: Denies blurred vision, double vision, hearing loss, tinnitus, sinus congestion, rhinorrhea, sore throat, neck stiffness, dysphagia PULM: per HPI CV: Denies chest pain, edema, orthopnea, paroxysmal nocturnal dyspnea, palpitations GI: Denies abdominal pain, nausea, vomiting, diarrhea, hematochezia, melena, constipation, change in bowel habits GU: Denies dysuria, hematuria, polyuria, oliguria, urethral discharge Endocrine: Denies hot or cold intolerance, polyuria, polyphagia or appetite change Derm: Denies rash, dry skin, scaling or peeling skin change Heme: Denies easy bruising, bleeding, bleeding gums Neuro: Denies headache, numbness, weakness, slurred speech, loss of memory or consciousness   SUBJECTIVE:  As above  VITAL SIGNS: BP 110/81 (BP Location: Left Arm)   Pulse 74   Temp 98 F (36.7 C) (Oral)   Resp 18   Ht '5\' 5"'$  (1.651 m)   Wt 195 lb 12.8 oz (88.8 kg)   SpO2 99%   BMI 32.58 kg/m   HEMODYNAMICS:    VENTILATOR SETTINGS:    INTAKE / OUTPUT: I/O last 3 completed shifts: In: 1861.3 [P.O.:1320; I.V.:3; Other:10; IV Piggyback:528.3] Out: 5755 [Urine:5755]  PHYSICAL EXAMINATION:  General:  Resting comfortably in bed HENT: NCAT OP clear PULM: CTA B, normal effort CV: RRR, no mgr GI: BS+, soft, nontender MSK: normal bulk and tone Neuro: awake, alert, no distress, MAEW   LABS:  BMET Recent Labs  Lab 03/14/17 0406 03/15/17 0523 03/16/17 0334  NA 127* 123* 125*  K 4.7 5.2* 4.6  CL 93* 90* 95*  CO2 23 23 20*  BUN 24* 29* 33*  CREATININE 1.20 1.51*  1.52*  GLUCOSE 344* 543* 383*    Electrolytes Recent Labs  Lab 03/14/17 0406 03/15/17 0523 03/16/17 0334  CALCIUM 8.9 9.3 9.4    CBC Recent Labs  Lab 03/14/17 0406 03/15/17 0523 03/16/17 0334  WBC 8.9 8.2 8.0  HGB 12.6* 12.6* 13.5  HCT 37.2* 37.0* 39.5  PLT 148* 157 158    Coag's No results for input(s): APTT, INR in the last 168 hours.  Sepsis Markers Recent Labs  Lab 03/12/17 1506 03/12/17 2043  LATICACIDVEN 3.15* 1.61    ABG Recent Labs  Lab 03/12/17 1731  PHART 7.476*  PCO2ART 42.5  PO2ART 82.0*    Liver Enzymes Recent Labs  Lab 03/12/17 1448  AST 36  ALT 42  ALKPHOS 95  BILITOT 0.5  ALBUMIN 3.5    Cardiac Enzymes Recent Labs  Lab 03/12/17 1448  TROPONINI 0.09*    Glucose Recent Labs  Lab 03/15/17 0943 03/15/17 1142 03/15/17 1606 03/15/17 2005 03/16/17 0743 03/16/17 1121  GLUCAP 219* 265*  297* 390* 317* 217*    Imaging No results found.   STUDIES:  CT angiogram chest images independently reviewed showing groundglass opacities in an upper lobe distribution no pleural effusion no clear findings suggestive of pulmonary edema, no intralobular septal thickening.  CULTURES:   ANTIBIOTICS: Bactrim  SIGNIFICANT EVENTS:   LINES/TUBES:   DISCUSSION: 41 year old male with a past medical history significant for HIV who has a low CD4 count (150 absolute count last week) but an undetectable viral load who presents with community-acquired pneumonia causing mild hypoxemia.  That I think it would be unusual with his undetectable viral load he is at increased risk for PCP pneumonia.  I agree with the current management.  We will make plans for bronchoscopy.  ASSESSMENT / PLAN:  PULMONARY A: Acute respiratory failure with hypoxemia Community acquired pneumonia Bilateral pulmonary infiltrates P:   Plan bronchoscopy on Monday, March 20, 2017 keep n.p.o. the night before Continue management as per infectious disease and [facility]  hospitalist.    Roselie Awkward, MD [facility] Pager: [phone] Cell: [phone] After 3pm or if no response, call [phone]   03/16/2017, 11:37 AM

## 2017-03-16 NOTE — Progress Notes (Signed)
INFECTIOUS DISEASE PROGRESS NOTE  ID: Shaun Brennan is a 41 y.o. male with  Principal Problem:   Acute respiratory distress Active Problems:   DM2 (diabetes mellitus, type 2) (HCC)   HIV (human immunodeficiency virus infection) (HCC)   HTN (hypertension)   Chronic systolic heart failure (HCC)   Pneumonia   Alcohol abuse with alcohol-induced mood disorder (HCC)   AIDS (HCC)  Subjective: Still with hacking cough. Did not sleep well last night due to the coughing. Mild chest pain in center of chest with coughing. Still with dyspnea with attempts to get up to bathroom; otherwise not moving around much. No fevers but still with sweating. Good appetite, no nausea or vomiting.   Abtx:  Anti-infectives (From admission, onward)   Start     Dose/Rate Route Frequency Ordered Stop   03/15/17 1800  levofloxacin (LEVAQUIN) tablet 750 mg     750 mg Oral Every 24 hours 03/15/17 0859     03/15/17 1000  sulfamethoxazole-trimethoprim (BACTRIM DS,SEPTRA DS) 800-160 MG per tablet 2 tablet     2 tablet Oral 3 times daily 03/15/17 0857     03/13/17 0200  sulfamethoxazole-trimethoprim (BACTRIM) 452.8 mg in dextrose 5 % 500 mL IVPB  Status:  Discontinued     15 mg/kg/day  90.7 kg 352.2 mL/hr over 90 Minutes Intravenous Every 8 hours 03/12/17 1824 03/15/17 0856   03/12/17 1800  levofloxacin (LEVAQUIN) IVPB 750 mg  Status:  Discontinued     750 mg 100 mL/hr over 90 Minutes Intravenous Every 24 hours 03/12/17 1730 03/15/17 0858   03/12/17 1730  sulfamethoxazole-trimethoprim (BACTRIM) 452.8 mg in dextrose 5 % 500 mL IVPB     5 mg/kg  90.7 kg 352.2 mL/hr over 90 Minutes Intravenous  Once 03/12/17 1625 03/12/17 1859   03/12/17 1730  dolutegravir (TIVICAY) tablet 50 mg     50 mg Oral Daily 03/12/17 1719     03/12/17 1730  emtricitabine-tenofovir AF (DESCOVY) 200-25 MG per tablet 1 tablet     1 tablet Oral Daily 03/12/17 1719        Medications:  Scheduled: . aspirin EC  81 mg Oral Daily  .  atorvastatin  20 mg Oral Daily  . carvedilol  25 mg Oral BID  . dolutegravir  50 mg Oral Daily  . emtricitabine-tenofovir AF  1 tablet Oral Daily  . heparin  5,000 Units Subcutaneous Q8H  . hydrALAZINE  100 mg Oral TID  . insulin aspart  0-20 Units Subcutaneous TID WC  . insulin aspart  0-5 Units Subcutaneous QHS  . insulin glargine  15 Units Subcutaneous BID  . ipratropium-albuterol  3 mL Nebulization BID  . isosorbide mononitrate  60 mg Oral Daily  . levofloxacin  750 mg Oral Q24H  . predniSONE  40 mg Oral BID WC  . senna  1 tablet Oral BID  . sodium chloride flush  3 mL Intravenous Q12H  . sodium chloride HYPERTONIC  4 mL Nebulization BID  . sulfamethoxazole-trimethoprim  2 tablet Oral TID    Objective: Vital signs in last 24 hours: Temp:  [97.8 F (36.6 C)-98.9 F (37.2 C)] 98.7 F (37.1 C) (12/07 0700) Pulse Rate:  [80-86] 81 (12/07 0700) Resp:  [17-18] 18 (12/07 0217) BP: (120-150)/(79-96) 144/95 (12/07 0700) SpO2:  [97 %-100 %] 100 % (12/07 0700) Weight:  [195 lb 12.8 oz (88.8 kg)] 195 lb 12.8 oz (88.8 kg) (12/07 0657)   General appearance: alert, cooperative and no distress Resp: diminished breath sounds posterior -  bilateral and rhonchi bilaterally Cardio: regular rate and rhythm GI: normal findings: bowel sounds normal and soft, non-tender Extremities: edema none  Lab Results Recent Labs    03/15/17 0523 03/16/17 0334  WBC 8.2 8.0  HGB 12.6* 13.5  HCT 37.0* 39.5  NA 123* 125*  K 5.2* 4.6  CL 90* 95*  CO2 23 20*  BUN 29* 33*  CREATININE 1.51* 1.52*   Liver Panel No results for input(s): PROT, ALBUMIN, AST, ALT, ALKPHOS, BILITOT, BILIDIR, IBILI in the last 72 hours. Sedimentation Rate No results for input(s): ESRSEDRATE in the last 72 hours. C-Reactive Protein No results for input(s): CRP in the last 72 hours.  Microbiology: Recent Results (from the past 240 hour(s))  Blood culture (routine x 2)     Status: None (Preliminary result)   Collection  Time: 03/12/17  2:49 PM  Result Value Ref Range Status   Specimen Description BLOOD RIGHT ANTECUBITAL  Final   Special Requests   Final    BOTTLES DRAWN AEROBIC AND ANAEROBIC Blood Culture adequate volume   Culture NO GROWTH 3 DAYS  Final   Report Status PENDING  Incomplete  Blood culture (routine x 2)     Status: None (Preliminary result)   Collection Time: 03/12/17  2:50 PM  Result Value Ref Range Status   Specimen Description BLOOD LEFT ANTECUBITAL  Final   Special Requests   Final    BOTTLES DRAWN AEROBIC AND ANAEROBIC Blood Culture adequate volume   Culture NO GROWTH 3 DAYS  Final   Report Status PENDING  Incomplete  MRSA PCR Screening     Status: None   Collection Time: 03/13/17  2:02 PM  Result Value Ref Range Status   MRSA by PCR NEGATIVE NEGATIVE Final    Comment:        The GeneXpert MRSA Assay (FDA approved for NASAL specimens only), is one component of a comprehensive MRSA colonization surveillance program. It is not intended to diagnose MRSA infection nor to guide or monitor treatment for MRSA infections.   Respiratory Panel by PCR     Status: None   Collection Time: 03/13/17  3:15 PM  Result Value Ref Range Status   Adenovirus NOT DETECTED NOT DETECTED Final   Coronavirus 229E NOT DETECTED NOT DETECTED Final   Coronavirus HKU1 NOT DETECTED NOT DETECTED Final   Coronavirus NL63 NOT DETECTED NOT DETECTED Final   Coronavirus OC43 NOT DETECTED NOT DETECTED Final   Metapneumovirus NOT DETECTED NOT DETECTED Final   Rhinovirus / Enterovirus NOT DETECTED NOT DETECTED Final   Influenza A NOT DETECTED NOT DETECTED Final   Influenza B NOT DETECTED NOT DETECTED Final   Parainfluenza Virus 1 NOT DETECTED NOT DETECTED Final   Parainfluenza Virus 2 NOT DETECTED NOT DETECTED Final   Parainfluenza Virus 3 NOT DETECTED NOT DETECTED Final   Parainfluenza Virus 4 NOT DETECTED NOT DETECTED Final   Respiratory Syncytial Virus NOT DETECTED NOT DETECTED Final   Bordetella  pertussis NOT DETECTED NOT DETECTED Final   Chlamydophila pneumoniae NOT DETECTED NOT DETECTED Final   Mycoplasma pneumoniae NOT DETECTED NOT DETECTED Final    Studies/Results: No results found.   Assessment/Plan: AIDS (CD4 150 VL < 20 03/2017) =  - Continue Tivicay / Descovy  PCP/PJP =  - PCP stain not done. Attempted induced sputum however not successful. Discussed with Dr. Ella Jubilee who will discuss with pulmonology about bronchoscopy/BAL. - Continue levquin for now (day 4/7) - Will consider changing Bactrim to Clindamycin + Primaquine d/t acute  creatinine elevation (1.2 > 1.5 with previous baseline ~1). - Continue Prednisone with taper              40mg  bid x 5 days total (day 4/5)             40mg  daily x 5 days             20mg  x 11 days   - Still with significant dry hacking cough - encourage PRN robitussin   ?CHF =  - He has put out 10 L since adm - Hyponatremia (Na 125) - Creatinine acutely elevated - 1.52 up from previous baseline of ~1 likely r/t TMP and diuretic effect.  - TTE EF 30-35%  Diabetes, Uncontrolled =  - Glc still irregular  Total days of antibiotics: 4 bactrim/levaquin/prednisone         Rexene AlbertsStephanie Dixon, MSN, NP-C [facility] Cell: [phone] Pager: [phone]  03/16/2017  8:54 AM

## 2017-03-16 NOTE — Progress Notes (Signed)
PROGRESS NOTE    Shaun Brennan  IRJ:[id number] DOB: Jun 24, 1975 DOA: 03/12/2017 PCP: Patient, No Pcp Per    Brief Narrative:  41 year old male who presented with dyspnea. Patient does have significant past medical history of HIV, type 2 diabetes mellitus, systolic heart failure, and hypertension. Complaint of 48 hours of her tic cough, chills, diaphoresis,chest pain and dyspnea. Worsening symptoms over last 24 hours prior to hospitalization. Questionable noncompliance with antiretroviral therapy. On his initial physical examination blood pressure 164/110, heart rate 96, temperature 98.2, respiratory 24, oxygen saturation 93% on supplemental oxygen. He was noted to be in respiratory distress, increased work of breathing, his lungs had decreased breath sounds bilaterally, scattered rails, wheezing, bilaterally. Heart S1-S2 present and rhythmic, the abdomen was soft nontender, no lower extremity edema. Chest x-ray with faint opacity of the right lower lobe and left upper lobe. Chest CT with bilateral groundglass opacities, more predominantly on the left upper lobe.   Patient was admitted to hospital working diagnosis acute hypoxic respiratory failure due to community-acquired pneumonia in the setting of immunosuppression, due to HIV/ AIDS.   Assessment & Plan:   Principal Problem:   Acute respiratory distress Active Problems:   DM2 (diabetes mellitus, type 2) (HCC)   HIV (human immunodeficiency virus infection) (HCC)   HTN (hypertension)   Chronic systolic heart failure (HCC)   Pneumonia   Alcohol abuse with alcohol-induced mood disorder (HCC)   AIDS (HCC)   1. Acute hypoxic respiratory failure. Perssistent cough and dyspnea,  continue oxymetry monitoring and supplemental 02 per [place]. Continue bronchodilator therapy with douneb q 6 hours and as needed q 2 hours. Out of bed as tolerated. Oxygen saturation 98% on 1 LPM nasal canula 02.   2. Multifocal pneumonia. Bactrim plus levofloxacin for  suspected pneumocystis J, changed to oral therapy. Patient has been not able to provide adequate sputum sample with the use of hypertonic saline incentivation, consulted pulmonary to evaluate possibility for diagnostic bronchoscopy.      3. HIV/ AIDS CD 4 at 150 and 22%.Continue with antiretroviral therapy: dolutegravir, descovy.  4. Type 2 diabetes mellitus.Continue to have uncontrolled hyperglycemia, capillary glucose  297, 390, 317, 217, 406. Will continue sliding scale insulin resistant regimen, and will increase insulin glargine to 20 units bid.   5. HTN. On carvedilol, hydralazine and isosorbide with good blood pressure control.  DVT prophylaxis:heparin Code Status:full Family Communication: Disposition Plan:   Consultants:    Procedures:    Antimicrobials:     Subjective: Patient with persistent cough but unable to expectorate, dyspnea slowly continue to improve, no fever or chills, no nausea or vomiting.   Objective: Vitals:   03/16/17 0215 03/16/17 0217 03/16/17 0657 03/16/17 0700  BP:   (!) 150/96 (!) 144/95  Pulse:  80 81 81  Resp:  18    Temp:   97.8 F (36.6 C) 98.7 F (37.1 C)  TempSrc:   Oral Oral  SpO2: 98% 98% 99% 100%  Weight:   88.8 kg (195 lb 12.8 oz)   Height:        Intake/Output Summary (Last 24 hours) at 03/16/2017 0840 Last data filed at 03/16/2017 0500 Gross per 24 hour  Intake 1083 ml  Output 3000 ml  Net -1917 ml   Filed Weights   03/12/17 1320 03/15/17 0427 03/16/17 0657  Weight: 90.7 kg (200 lb) 90.6 kg (199 lb 11.2 oz) 88.8 kg (195 lb 12.8 oz)    Examination:   General: deconditioned Neurology: Awake and alert, non focal  E ENT: mild pallor, no icterus, oral mucosa moist Cardiovascular: No JVD. S1-S2 present, rhythmic, no gallops, rubs, or murmurs. No lower extremity edema. Pulmonary: decreased breath sounds bilaterally, decreased air movement, no wheezing, but scattered rhonchi and  rales. Gastrointestinal. Abdomen protuberant, no organomegaly, non tender, no rebound or guarding Skin. No rashes Musculoskeletal: no joint deformities     Data Reviewed: I have personally reviewed following labs and imaging studies  CBC: Recent Labs  Lab 03/12/17 1327 03/13/17 0454 03/14/17 0406 03/15/17 0523 03/16/17 0334  WBC 6.3 5.7 8.9 8.2 8.0  NEUTROABS  --   --   --  6.8 6.5  HGB 14.1 13.1 12.6* 12.6* 13.5  HCT 40.9 37.8* 37.2* 37.0* 39.5  MCV 97.8 96.4 96.9 96.4 97.3  PLT 147* 154 148* 157 158   Basic Metabolic Panel: Recent Labs  Lab 03/12/17 1327 03/13/17 0454 03/14/17 0406 03/15/17 0523 03/16/17 0334  NA 129* 128* 127* 123* 125*  K 4.5 5.1 4.7 5.2* 4.6  CL 91* 89* 93* 90* 95*  CO2 25 28 23 23  20*  GLUCOSE 490* 452* 344* 543* 383*  BUN 11 16 24* 29* 33*  CREATININE 1.04 1.29* 1.20 1.51* 1.52*  CALCIUM 8.8* 8.6* 8.9 9.3 9.4   GFR: Estimated Creatinine Clearance: 66.2 mL/min (A) (by C-G formula based on SCr of 1.52 mg/dL (H)). Liver Function Tests: Recent Labs  Lab 03/12/17 1448  AST 36  ALT 42  ALKPHOS 95  BILITOT 0.5  PROT 7.8  ALBUMIN 3.5   No results for input(s): LIPASE, AMYLASE in the last 168 hours. No results for input(s): AMMONIA in the last 168 hours. Coagulation Profile: No results for input(s): INR, PROTIME in the last 168 hours. Cardiac Enzymes: Recent Labs  Lab 03/12/17 1448  TROPONINI 0.09*   BNP (last 3 results) No results for input(s): PROBNP in the last 8760 hours. HbA1C: No results for input(s): HGBA1C in the last 72 hours. CBG: Recent Labs  Lab 03/15/17 0943 03/15/17 1142 03/15/17 1606 03/15/17 2005 03/16/17 0743  GLUCAP 219* 265* 297* 390* 317*   Lipid Profile: No results for input(s): CHOL, HDL, LDLCALC, TRIG, CHOLHDL, LDLDIRECT in the last 72 hours. Thyroid Function Tests: No results for input(s): TSH, T4TOTAL, FREET4, T3FREE, THYROIDAB in the last 72 hours. Anemia Panel: No results for input(s):  VITAMINB12, FOLATE, FERRITIN, TIBC, IRON, RETICCTPCT in the last 72 hours.    Radiology Studies: I have reviewed all of the imaging during this hospital visit personally     Scheduled Meds: . aspirin EC  81 mg Oral Daily  . atorvastatin  20 mg Oral Daily  . carvedilol  25 mg Oral BID  . dolutegravir  50 mg Oral Daily  . emtricitabine-tenofovir AF  1 tablet Oral Daily  . heparin  5,000 Units Subcutaneous Q8H  . hydrALAZINE  100 mg Oral TID  . insulin aspart  0-20 Units Subcutaneous TID WC  . insulin aspart  0-5 Units Subcutaneous QHS  . insulin glargine  15 Units Subcutaneous BID  . ipratropium-albuterol  3 mL Nebulization BID  . isosorbide mononitrate  60 mg Oral Daily  . levofloxacin  750 mg Oral Q24H  . predniSONE  40 mg Oral BID WC  . senna  1 tablet Oral BID  . sodium chloride flush  3 mL Intravenous Q12H  . sodium chloride HYPERTONIC  4 mL Nebulization BID  . sulfamethoxazole-trimethoprim  2 tablet Oral TID   Continuous Infusions: . sodium chloride       LOS:  4 days        Tawni Millers, MD [facility] Pager [phone]

## 2017-03-17 LAB — BASIC METABOLIC PANEL
ANION GAP: 9 (ref 5–15)
BUN: 26 mg/dL — ABNORMAL HIGH (ref 6–20)
CHLORIDE: 97 mmol/L — AB (ref 101–111)
CO2: 22 mmol/L (ref 22–32)
Calcium: 9.6 mg/dL (ref 8.9–10.3)
Creatinine, Ser: 1.37 mg/dL — ABNORMAL HIGH (ref 0.61–1.24)
GFR calc Af Amer: >60 mL/min (ref >60)
GFR calc non Af Amer: >60 mL/min (ref >60)
GLUCOSE: 328 mg/dL — AB (ref 65–99)
POTASSIUM: 5 mmol/L (ref 3.5–5.1)
Sodium: 128 mmol/L — ABNORMAL LOW (ref 135–145)

## 2017-03-17 LAB — CBC WITH DIFFERENTIAL/PLATELET
BASOS ABS: 0 10*3/uL (ref 0.0–0.1)
Basophils Relative: 0 %
Eosinophils Absolute: 0 10*3/uL (ref 0.0–0.7)
Eosinophils Relative: 0 %
HEMATOCRIT: 40.2 % (ref 39.0–52.0)
HEMOGLOBIN: 14.1 g/dL (ref 13.0–17.0)
LYMPHS PCT: 8 %
Lymphs Abs: 0.7 10*3/uL (ref 0.7–4.0)
MCH: 33.3 pg (ref 26.0–34.0)
MCHC: 35.1 g/dL (ref 30.0–36.0)
MCV: 95 fL (ref 78.0–100.0)
MONO ABS: 1.2 10*3/uL — AB (ref 0.1–1.0)
Monocytes Relative: 13 %
NEUTROS ABS: 7.1 10*3/uL (ref 1.7–7.7)
Neutrophils Relative %: 79 %
Platelets: 160 10*3/uL (ref 150–400)
RBC: 4.23 MIL/uL (ref 4.22–5.81)
RDW: 13 % (ref 11.5–15.5)
WBC: 8.9 10*3/uL (ref 4.0–10.5)

## 2017-03-17 LAB — CULTURE, BLOOD (ROUTINE X 2)
CULTURE: NO GROWTH
CULTURE: NO GROWTH
SPECIAL REQUESTS: ADEQUATE
Special Requests: ADEQUATE

## 2017-03-17 LAB — GLUCOSE, CAPILLARY
GLUCOSE-CAPILLARY: 473 mg/dL — AB (ref 65–99)
Glucose-Capillary: 207 mg/dL — ABNORMAL HIGH (ref 65–99)
Glucose-Capillary: 277 mg/dL — ABNORMAL HIGH (ref 65–99)
Glucose-Capillary: 412 mg/dL — ABNORMAL HIGH (ref 65–99)
Glucose-Capillary: 166 mg/dL — ABNORMAL HIGH (ref 65–99)

## 2017-03-17 MED ORDER — INSULIN ASPART 100 UNIT/ML ~~LOC~~ SOLN
20.0000 [IU] | Freq: Once | SUBCUTANEOUS | Status: AC
  Administered 2017-03-17: 20 [IU] via SUBCUTANEOUS

## 2017-03-17 MED ORDER — PREDNISONE 20 MG PO TABS
40.0000 mg | ORAL_TABLET | Freq: Every day | ORAL | Status: DC
  Administered 2017-03-18 – 2017-03-20 (×3): 40 mg via ORAL
  Filled 2017-03-17 (×3): qty 2

## 2017-03-17 NOTE — Progress Notes (Signed)
PROGRESS NOTE    Shaun Brennan  ZOX:[id number] DOB: April 24, 1975 DOA: 03/12/2017 PCP: Patient, No Pcp Per    Brief Narrative:  41 year old male who presented with dyspnea. Patient does have significant past medical history of HIV, type 2 diabetes mellitus, systolic heart failure, and hypertension. Complaint of 48 hours of her tic cough, chills, diaphoresis,chest pain and dyspnea. Worsening symptoms over last 24 hours prior to hospitalization. Questionable noncompliance with antiretroviral therapy. On his initial physical examination blood pressure 164/110, heart rate 96, temperature 98.2, respiratory 24, oxygen saturation 93% on supplemental oxygen. He was noted to be in respiratory distress, increased work of breathing, his lungs had decreased breath sounds bilaterally, scattered rails, wheezing, bilaterally. Heart S1-S2 present and rhythmic, the abdomen was soft nontender, no lower extremity edema. Chest x-ray with faint opacity of the right lower lobe and left upper lobe. Chest CT with bilateral groundglass opacities, more predominantly on the left upper lobe.   Patient was admitted to hospital working diagnosis acute hypoxic respiratory failure due to community-acquired pneumonia in the setting of immunosuppression, due to HIV/ AIDS.   Assessment & Plan:   Principal Problem:   Acute respiratory distress Active Problems:   DM2 (diabetes mellitus, type 2) (HCC)   HIV (human immunodeficiency virus infection) (HCC)   HTN (hypertension)   Chronic systolic heart failure (HCC)   Pneumonia   Alcohol abuse with alcohol-induced mood disorder (HCC)   AIDS (HCC)   1. Acute hypoxic respiratory failure.Dyspnea has improved, continue bronchodilator therapy with douneb q 6 hours and as needed q 2 hours. Ambulate in the hallway as tolerated. Continue oxymetry monitoring. Currently oxygen saturation 97% on room air.   2. Multifocal pneumonia. Patient on Bactrim plus levofloxacin for suspected  Pneumocystis J, Scheduled for diagnostic bronchoscopy, on 12/10. Continue systemic steroids for now until Pneumocystis needs to confirmed, in order to continue therapy.     3. HIV/ AIDSCD 4 at 150 and 22% with HIV quant < 20.Continue antiretroviral therapy: dolutegravir, descovy, per ID recommendations, will need close follow up as outpatient/  4. Type 2 diabetes mellitus.Continue uncontrolled glucose with capillary measurements, at 353, 207, 277, 412, 473. Patient on 20 units bid of glargine and insulin sliding scale. Taper steroids. Fasting glucose 320.   5. HTN. Continue carvedilol, hydralazine and isosorbide. Systolic blood pressure 135 to 140.   DVT prophylaxis:heparin Code Status:full Family Communication: Disposition Plan:   Consultants:    Procedures:    Antimicrobials    Subjective: Patient with persistent dyspnea, but has improved, decreased cough frequency, no nausea or vomiting, no chest pain.   Objective: Vitals:   03/16/17 2355 03/17/17 0424 03/17/17 0751 03/17/17 1123  BP: 120/74 (!) 138/92 (!) 140/94 110/74  Pulse: 77 77 75 84  Resp: 18 19 18 18   Temp: 98.9 F (37.2 C) 98.9 F (37.2 C) 98.1 F (36.7 C) 98.7 F (37.1 C)  TempSrc: Oral Oral Oral Oral  SpO2: 99% 97% 99% 96%  Weight:  87.5 kg (192 lb 12.8 oz)    Height:        Intake/Output Summary (Last 24 hours) at 03/17/2017 1209 Last data filed at 03/17/2017 1124 Gross per 24 hour  Intake 1940 ml  Output 4225 ml  Net -2285 ml   Filed Weights   03/15/17 0427 03/16/17 0657 03/17/17 0424  Weight: 90.6 kg (199 lb 11.2 oz) 88.8 kg (195 lb 12.8 oz) 87.5 kg (192 lb 12.8 oz)    Examination:   General: Not in pain or dyspnea, deconditioned  Neurology: Awake and alert, non focal  E ENT: mild pallor, no icterus, oral mucosa moist Cardiovascular: No JVD. S1-S2 present, rhythmic, no gallops, rubs, or murmurs. No lower extremity edema. Pulmonary: positive breath sounds bilaterally,  reduced air movement, no wheezing, no rhonchi but scattered rales. Gastrointestinal. Abdomen protuberant, no organomegaly, non tender, no rebound or guarding Skin. No rashes Musculoskeletal: no joint deformities     Data Reviewed: I have personally reviewed following labs and imaging studies  CBC: Recent Labs  Lab 03/13/17 0454 03/14/17 0406 03/15/17 0523 03/16/17 0334 03/17/17 0331  WBC 5.7 8.9 8.2 8.0 8.9  NEUTROABS  --   --  6.8 6.5 7.1  HGB 13.1 12.6* 12.6* 13.5 14.1  HCT 37.8* 37.2* 37.0* 39.5 40.2  MCV 96.4 96.9 96.4 97.3 95.0  PLT 154 148* 157 158 160   Basic Metabolic Panel: Recent Labs  Lab 03/13/17 0454 03/14/17 0406 03/15/17 0523 03/16/17 0334 03/17/17 0331  NA 128* 127* 123* 125* 128*  K 5.1 4.7 5.2* 4.6 5.0  CL 89* 93* 90* 95* 97*  CO2 28 23 23  20* 22  GLUCOSE 452* 344* 543* 383* 328*  BUN 16 24* 29* 33* 26*  CREATININE 1.29* 1.20 1.51* 1.52* 1.37*  CALCIUM 8.6* 8.9 9.3 9.4 9.6   GFR: Estimated Creatinine Clearance: 72.9 mL/min (A) (by C-G formula based on SCr of 1.37 mg/dL (H)). Liver Function Tests: Recent Labs  Lab 03/12/17 1448  AST 36  ALT 42  ALKPHOS 95  BILITOT 0.5  PROT 7.8  ALBUMIN 3.5   No results for input(s): LIPASE, AMYLASE in the last 168 hours. No results for input(s): AMMONIA in the last 168 hours. Coagulation Profile: No results for input(s): INR, PROTIME in the last 168 hours. Cardiac Enzymes: Recent Labs  Lab 03/12/17 1448  TROPONINI 0.09*   BNP (last 3 results) No results for input(s): PROBNP in the last 8760 hours. HbA1C: No results for input(s): HGBA1C in the last 72 hours. CBG: Recent Labs  Lab 03/16/17 1121 03/16/17 1624 03/16/17 2052 03/17/17 0749 03/17/17 1121  GLUCAP 217* 406* 353* 207* 277*   Lipid Profile: No results for input(s): CHOL, HDL, LDLCALC, TRIG, CHOLHDL, LDLDIRECT in the last 72 hours. Thyroid Function Tests: No results for input(s): TSH, T4TOTAL, FREET4, T3FREE, THYROIDAB in the  last 72 hours. Anemia Panel: No results for input(s): VITAMINB12, FOLATE, FERRITIN, TIBC, IRON, RETICCTPCT in the last 72 hours.    Radiology Studies: I have reviewed all of the imaging during this hospital visit personally     Scheduled Meds: . aspirin EC  81 mg Oral Daily  . atorvastatin  20 mg Oral Daily  . carvedilol  25 mg Oral BID  . dolutegravir  50 mg Oral Daily  . emtricitabine-tenofovir AF  1 tablet Oral Daily  . guaiFENesin-dextromethorphan  10 mL Oral Q6H  . heparin  5,000 Units Subcutaneous Q8H  . hydrALAZINE  100 mg Oral TID  . insulin aspart  0-20 Units Subcutaneous TID WC  . insulin aspart  0-5 Units Subcutaneous QHS  . insulin glargine  20 Units Subcutaneous BID  . ipratropium-albuterol  3 mL Nebulization BID  . isosorbide mononitrate  60 mg Oral Daily  . levofloxacin  750 mg Oral Q24H  . predniSONE  40 mg Oral BID WC  . senna  1 tablet Oral BID  . sodium chloride flush  3 mL Intravenous Q12H  . sodium chloride HYPERTONIC  4 mL Nebulization BID  . sulfamethoxazole-trimethoprim  2 tablet Oral TID  Continuous Infusions: . sodium chloride       LOS: 5 days        Mauricio Annett Gula, MD [facility] Pager [phone]

## 2017-03-17 NOTE — Progress Notes (Signed)
CSW met with pt's partner regarding meal vouchers. CSW provided meal vouchers until 03/18/17.  No further needs at this time.  CSW signing off.  Reed Breech, [place]

## 2017-03-18 LAB — CBC WITH DIFFERENTIAL/PLATELET
Basophils Absolute: 0 10*3/uL (ref 0.0–0.1)
Basophils Relative: 0 %
EOS PCT: 0 %
Eosinophils Absolute: 0 10*3/uL (ref 0.0–0.7)
HCT: 38.8 % — ABNORMAL LOW (ref 39.0–52.0)
HEMOGLOBIN: 13.7 g/dL (ref 13.0–17.0)
LYMPHS ABS: 1.3 10*3/uL (ref 0.7–4.0)
LYMPHS PCT: 12 %
MCH: 33.7 pg (ref 26.0–34.0)
MCHC: 35.3 g/dL (ref 30.0–36.0)
MCV: 95.6 fL (ref 78.0–100.0)
Monocytes Absolute: 1.6 10*3/uL — ABNORMAL HIGH (ref 0.1–1.0)
Monocytes Relative: 15 %
NEUTROS PCT: 73 %
Neutro Abs: 7.8 10*3/uL — ABNORMAL HIGH (ref 1.7–7.7)
Platelets: 156 10*3/uL (ref 150–400)
RBC: 4.06 MIL/uL — ABNORMAL LOW (ref 4.22–5.81)
RDW: 13 % (ref 11.5–15.5)
WBC: 10.7 10*3/uL — AB (ref 4.0–10.5)

## 2017-03-18 LAB — BASIC METABOLIC PANEL
Anion gap: 8 (ref 5–15)
BUN: 26 mg/dL — AB (ref 6–20)
CHLORIDE: 96 mmol/L — AB (ref 101–111)
CO2: 26 mmol/L (ref 22–32)
Calcium: 9.4 mg/dL (ref 8.9–10.3)
Creatinine, Ser: 1.34 mg/dL — ABNORMAL HIGH (ref 0.61–1.24)
GFR calc Af Amer: >60 mL/min (ref >60)
GFR calc non Af Amer: >60 mL/min (ref >60)
Glucose, Bld: 253 mg/dL — ABNORMAL HIGH (ref 65–99)
POTASSIUM: 4.8 mmol/L (ref 3.5–5.1)
SODIUM: 130 mmol/L — AB (ref 135–145)

## 2017-03-18 LAB — GLUCOSE, CAPILLARY
GLUCOSE-CAPILLARY: 393 mg/dL — AB (ref 65–99)
Glucose-Capillary: 298 mg/dL — ABNORMAL HIGH (ref 65–99)
Glucose-Capillary: 274 mg/dL — ABNORMAL HIGH (ref 65–99)
Glucose-Capillary: 333 mg/dL — ABNORMAL HIGH (ref 65–99)

## 2017-03-18 MED ORDER — HEPARIN SODIUM (PORCINE) 5000 UNIT/ML IJ SOLN
5000.0000 [IU] | Freq: Three times a day (TID) | INTRAMUSCULAR | Status: AC
  Administered 2017-03-18 (×2): 5000 [IU] via SUBCUTANEOUS
  Filled 2017-03-18 (×2): qty 1

## 2017-03-18 MED ORDER — INSULIN ASPART 100 UNIT/ML ~~LOC~~ SOLN
6.0000 [IU] | Freq: Three times a day (TID) | SUBCUTANEOUS | Status: DC
  Administered 2017-03-18 – 2017-03-20 (×6): 6 [IU] via SUBCUTANEOUS

## 2017-03-18 NOTE — Progress Notes (Signed)
Inpatient Diabetes Program Recommendations  AACE/ADA: New Consensus Statement on Inpatient Glycemic Control (2015)  Target Ranges:  Prepandial:   less than 140 mg/dL      Peak postprandial:   less than 180 mg/dL (1-2 hours)      Critically ill patients:  140 - 180 mg/dL   Lab Results  Component Value Date   GLUCAP 298 (H) 03/18/2017   HGBA1C 10.8 (H) 03/13/2017    Review of Glycemic ControlResults for KESON, CREW (MRN [id number]) as of 03/18/2017 10:20  Ref. Range 03/17/2017 07:49 03/17/2017 11:21 03/17/2017 16:05 03/17/2017 17:21 03/17/2017 20:31 03/18/2017 07:48  Glucose-Capillary Latest Ref Range: 65 - 99 mg/dL 865 (H) 784 (H) 696 (H) 473 (H) 166 (H) 298 (H)    Diabetes history: Type 2 DM Outpatient Diabetes medications: 70/30 15 units bid, Metformin 1000 mg bid Current orders for Inpatient glycemic control:  Novolog 6 units tid with meals (just added 12/9) Novolog resistant tid with meals and HS Lantus 20 units bid Prednisone 40 mg daily Inpatient Diabetes Program Recommendations:    Referral received.  Note that meal coverage added today.  Agree with current orders as Prednisone usually has substantial impact on post-prandial blood sugars.  Will follow.   Thanks, Beryl Meager, RN, BC-ADM Inpatient Diabetes Coordinator Pager [phone] (8a-5p)

## 2017-03-18 NOTE — Progress Notes (Signed)
PROGRESS NOTE    Shaun Brennan  ZOX:[id number] DOB: [birth year] DOA: 03/12/2017 PCP: Patient, No Pcp Per   Brief Narrative:  41 year old male who presented with dyspnea on 03/12/17. Patient has significant past medical history of HIV, type 2 diabetes mellitus, systolic heart failure, and hypertension. Complained of 48 hours of her tic cough, chills, diaphoresis,chest pain and dyspnea. Worsening symptoms over last 24 hours prior to hospitalization. Questionable noncompliance with antiretroviral therapy. On his initial physical examination blood pressure 164/110, heart rate 96, temperature 98.2, respiratory 24, oxygen saturation 93% on supplemental oxygen. He was noted to be in respiratory distress, increased work of breathing, his lungs had decreased breath sounds bilaterally, scattered rails, wheezing, bilaterally. Heart S1-S2 present and rhythmic, the abdomen was soft nontender, no lower extremity edema. Chest x-ray with faint opacity of the right lower lobe and left upper lobe. Chest CT with bilateral groundglass opacities, more predominantly on the left upper lobe.   Patient was admitted to hospital working diagnosis acute hypoxic respiratory failure due to community-acquired pneumonia in the setting of immunosuppression, due to HIV/ AIDS.   Assessment & Plan:   Principal Problem:   Acute respiratory distress Active Problems:   DM2 (diabetes mellitus, type 2) (HCC)   HIV (human immunodeficiency virus infection) (HCC)   HTN (hypertension)   Chronic systolic heart failure (HCC)   Pneumonia   Alcohol abuse with alcohol-induced mood disorder (HCC)   AIDS (HCC)  1. Acute hypoxic respiratory failure. Oxygen saturation level 99% on room air at rest. Dyspnea improving.  -Continue nebulizers -Continue steroids -Ambulate in hallway documenting respiratory effort and oxygen saturation level  #2. Multifocal pneumonia. He is afebrile nontoxic appearing. Does have a leukocytosis but suspect this  is related to steroids. -Continue antibiotics -Patient scheduled for bronchoscopy tomorrow -hold heparin after 10pm dose tonite in anticipation of bronch -continue steroids for now until pneumocystis confirmed  #3. HIV/AIDS. CD4 150 and 22% with HIV quant ,20. -Continue antiretroviral therapy:dolutegravir, descovy, per ID recommendations. - will need close follow up as outpatient  #4. Type 2 diabetes. Poor control. A1c 10.8. Steroids likely contributing. Current regimen Lantus 20 units twice a day with sliding scale at meals and at bedtime. -We will add meal coverage -Continue Lantus at current dose -Continue sliding scale -Diabetes coordinator consult for recommendations  #5. Hypertension. Only fair control. Systolic blood pressure range [id number]. Diastolic range 85-101. Currently taking imdur, coreg, apressoline. Home regimen includes lasix as well -continue carvedilol, hdralazine and issorbid -monitor -consider resuming lasix if no improvement  #6. Hyponatremia. Sodium level 127 on admission. Improving. Sodium 130 today -monitor    DVT prophylaxis: heparin Code Status: full  Disposition Plan:    Consultants:     Procedures:    Antimicrobials:     Subjective: Patient reports he is "breathing better" denies pain nausea. Complains he's not being given enough food  Objective: Vitals:   03/18/17 0001 03/18/17 0502 03/18/17 0808 03/18/17 0852  BP: (!) 150/90 129/82 (!) 147/101   Pulse: 81 75    Resp: 17 18    Temp: 98.6 F (37 C) 98.2 F (36.8 C)  98 F (36.7 C)  TempSrc: Oral Oral  Oral  SpO2: 98% 98%  99%  Weight:  88 kg (194 lb)    Height:        Intake/Output Summary (Last 24 hours) at 03/18/2017 0901 Last data filed at 03/18/2017 0751 Gross per 24 hour  Intake 363 ml  Output 2750 ml  Net -2387 ml  Filed Weights   03/16/17 0657 03/17/17 0424 03/18/17 0502  Weight: 88.8 kg (195 lb 12.8 oz) 87.5 kg (192 lb 12.8 oz) 88 kg (194 lb)     Examination:  General exam: lying in bed talking to friend no acute distress Respiratory system: Respiratory effort normal at rest. Breath sounds quite diminished in bilateral bases. Hear no crackles no wheezing Cardiovascular system: S1 & S2 heard, RRR. No JVD, murmurs, rubs, gallops or clicks. No pedal edema. Gastrointestinal system: Abdomen is nondistended, soft and nontender. No organomegaly or masses felt. Normal bowel sounds heard. Central nervous system: Alert and oriented. No focal neurological deficits. Extremities: moves all extremities spontaneously Skin: No rashes, lesions or ulcers Psychiatry: Judgement and insight appear normal. Mood & affect appropriate.     Data Reviewed:   CBC: Recent Labs  Lab 03/14/17 0406 03/15/17 0523 03/16/17 0334 03/17/17 0331 03/18/17 0427  WBC 8.9 8.2 8.0 8.9 10.7*  NEUTROABS  --  6.8 6.5 7.1 7.8*  HGB 12.6* 12.6* 13.5 14.1 13.7  HCT 37.2* 37.0* 39.5 40.2 38.8*  MCV 96.9 96.4 97.3 95.0 95.6  PLT 148* 157 158 160 156   Basic Metabolic Panel: Recent Labs  Lab 03/14/17 0406 03/15/17 0523 03/16/17 0334 03/17/17 0331 03/18/17 0427  NA 127* 123* 125* 128* 130*  K 4.7 5.2* 4.6 5.0 4.8  CL 93* 90* 95* 97* 96*  CO2 23 23 20* 22 26  GLUCOSE 344* 543* 383* 328* 253*  BUN 24* 29* 33* 26* 26*  CREATININE 1.20 1.51* 1.52* 1.37* 1.34*  CALCIUM 8.9 9.3 9.4 9.6 9.4   GFR: Estimated Creatinine Clearance: 74.7 mL/min (A) (by C-G formula based on SCr of 1.34 mg/dL (H)). Liver Function Tests: Recent Labs  Lab 03/12/17 1448  AST 36  ALT 42  ALKPHOS 95  BILITOT 0.5  PROT 7.8  ALBUMIN 3.5   No results for input(s): LIPASE, AMYLASE in the last 168 hours. No results for input(s): AMMONIA in the last 168 hours. Coagulation Profile: No results for input(s): INR, PROTIME in the last 168 hours. Cardiac Enzymes: Recent Labs  Lab 03/12/17 1448  TROPONINI 0.09*   BNP (last 3 results) No results for input(s): PROBNP in the last 8760  hours. HbA1C: No results for input(s): HGBA1C in the last 72 hours. CBG: Recent Labs  Lab 03/17/17 1121 03/17/17 1605 03/17/17 1721 03/17/17 2031 03/18/17 0748  GLUCAP 277* 412* 473* 166* 298*   Lipid Profile: No results for input(s): CHOL, HDL, LDLCALC, TRIG, CHOLHDL, LDLDIRECT in the last 72 hours. Thyroid Function Tests: No results for input(s): TSH, T4TOTAL, FREET4, T3FREE, THYROIDAB in the last 72 hours. Anemia Panel: No results for input(s): VITAMINB12, FOLATE, FERRITIN, TIBC, IRON, RETICCTPCT in the last 72 hours. Urine analysis:    Component Value Date/Time   COLORURINE YELLOW 10/03/2016 1234   APPEARANCEUR CLEAR 10/03/2016 1234   LABSPEC 1.030 10/03/2016 1234   PHURINE 5.0 10/03/2016 1234   GLUCOSEU >=500 (A) 10/03/2016 1234   HGBUR SMALL (A) 10/03/2016 1234   BILIRUBINUR NEGATIVE 10/03/2016 1234   KETONESUR NEGATIVE 10/03/2016 1234   PROTEINUR 100 (A) 10/03/2016 1234   UROBILINOGEN 0.2 12/20/2013 1321   NITRITE NEGATIVE 10/03/2016 1234   LEUKOCYTESUR NEGATIVE 10/03/2016 1234   Sepsis Labs: @LABRCNTIP (procalcitonin:4,lacticidven:4)  ) Recent Results (from the past 240 hour(s))  Blood culture (routine x 2)     Status: None   Collection Time: 03/12/17  2:49 PM  Result Value Ref Range Status   Specimen Description BLOOD RIGHT ANTECUBITAL  Final  Special Requests   Final    BOTTLES DRAWN AEROBIC AND ANAEROBIC Blood Culture adequate volume   Culture NO GROWTH 5 DAYS  Final   Report Status 03/17/2017 FINAL  Final  Blood culture (routine x 2)     Status: None   Collection Time: 03/12/17  2:50 PM  Result Value Ref Range Status   Specimen Description BLOOD LEFT ANTECUBITAL  Final   Special Requests   Final    BOTTLES DRAWN AEROBIC AND ANAEROBIC Blood Culture adequate volume   Culture NO GROWTH 5 DAYS  Final   Report Status 03/17/2017 FINAL  Final  MRSA PCR Screening     Status: None   Collection Time: 03/13/17  2:02 PM  Result Value Ref Range Status   MRSA  by PCR NEGATIVE NEGATIVE Final    Comment:        The GeneXpert MRSA Assay (FDA approved for NASAL specimens only), is one component of a comprehensive MRSA colonization surveillance program. It is not intended to diagnose MRSA infection nor to guide or monitor treatment for MRSA infections.   Respiratory Panel by PCR     Status: None   Collection Time: 03/13/17  3:15 PM  Result Value Ref Range Status   Adenovirus NOT DETECTED NOT DETECTED Final   Coronavirus 229E NOT DETECTED NOT DETECTED Final   Coronavirus HKU1 NOT DETECTED NOT DETECTED Final   Coronavirus NL63 NOT DETECTED NOT DETECTED Final   Coronavirus OC43 NOT DETECTED NOT DETECTED Final   Metapneumovirus NOT DETECTED NOT DETECTED Final   Rhinovirus / Enterovirus NOT DETECTED NOT DETECTED Final   Influenza A NOT DETECTED NOT DETECTED Final   Influenza B NOT DETECTED NOT DETECTED Final   Parainfluenza Virus 1 NOT DETECTED NOT DETECTED Final   Parainfluenza Virus 2 NOT DETECTED NOT DETECTED Final   Parainfluenza Virus 3 NOT DETECTED NOT DETECTED Final   Parainfluenza Virus 4 NOT DETECTED NOT DETECTED Final   Respiratory Syncytial Virus NOT DETECTED NOT DETECTED Final   Bordetella pertussis NOT DETECTED NOT DETECTED Final   Chlamydophila pneumoniae NOT DETECTED NOT DETECTED Final   Mycoplasma pneumoniae NOT DETECTED NOT DETECTED Final      Radiology Studies: No results found.      Scheduled Meds: . aspirin EC  81 mg Oral Daily  . atorvastatin  20 mg Oral Daily  . carvedilol  25 mg Oral BID  . dolutegravir  50 mg Oral Daily  . emtricitabine-tenofovir AF  1 tablet Oral Daily  . guaiFENesin-dextromethorphan  10 mL Oral Q6H  . heparin  5,000 Units Subcutaneous Q8H  . hydrALAZINE  100 mg Oral TID  . insulin aspart  0-20 Units Subcutaneous TID WC  . insulin aspart  0-5 Units Subcutaneous QHS  . insulin glargine  20 Units Subcutaneous BID  . ipratropium-albuterol  3 mL Nebulization BID  . isosorbide mononitrate   60 mg Oral Daily  . levofloxacin  750 mg Oral Q24H  . predniSONE  40 mg Oral Q breakfast  . senna  1 tablet Oral BID  . sodium chloride flush  3 mL Intravenous Q12H  . sodium chloride HYPERTONIC  4 mL Nebulization BID  . sulfamethoxazole-trimethoprim  2 tablet Oral TID   Continuous Infusions: . sodium chloride       LOS: 6 days    Time spent: 60 minutes    Gwenyth Bender, MD [facility]  If 7PM-7AM, please contact night-coverage [email or web address] Password [email or web address] 03/18/2017, 9:01 AM

## 2017-03-19 ENCOUNTER — Encounter (HOSPITAL_COMMUNITY)
Admission: EM | Disposition: A | Discharge status: Home / Self Care | Payer: Self-pay | Source: Home / Self Care | Attending: Internal Medicine

## 2017-03-19 ENCOUNTER — Encounter (HOSPITAL_COMMUNITY): Payer: Self-pay

## 2017-03-19 DIAGNOSIS — F1094 Alcohol use, unspecified with alcohol-induced mood disorder: Secondary | ICD-10-CM

## 2017-03-19 DIAGNOSIS — E119 Type 2 diabetes mellitus without complications: Secondary | ICD-10-CM

## 2017-03-19 LAB — CBC
HEMATOCRIT: 39.3 % (ref 39.0–52.0)
Hemoglobin: 13.4 g/dL (ref 13.0–17.0)
MCH: 32.7 pg (ref 26.0–34.0)
MCHC: 34.1 g/dL (ref 30.0–36.0)
MCV: 95.9 fL (ref 78.0–100.0)
Platelets: 158 10*3/uL (ref 150–400)
RBC: 4.1 MIL/uL — ABNORMAL LOW (ref 4.22–5.81)
RDW: 13.1 % (ref 11.5–15.5)
WBC: 8.3 10*3/uL (ref 4.0–10.5)

## 2017-03-19 LAB — BASIC METABOLIC PANEL
Anion gap: 8 (ref 5–15)
BUN: 26 mg/dL — AB (ref 6–20)
CALCIUM: 9 mg/dL (ref 8.9–10.3)
CO2: 25 mmol/L (ref 22–32)
CREATININE: 1.27 mg/dL — AB (ref 0.61–1.24)
Chloride: 96 mmol/L — ABNORMAL LOW (ref 101–111)
GFR calc non Af Amer: >60 mL/min (ref >60)
GLUCOSE: 238 mg/dL — AB (ref 65–99)
Potassium: 4.4 mmol/L (ref 3.5–5.1)
Sodium: 129 mmol/L — ABNORMAL LOW (ref 135–145)

## 2017-03-19 LAB — GLUCOSE, CAPILLARY
GLUCOSE-CAPILLARY: 365 mg/dL — AB (ref 65–99)
GLUCOSE-CAPILLARY: 270 mg/dL — AB (ref 65–99)
Glucose-Capillary: 176 mg/dL — ABNORMAL HIGH (ref 65–99)
Glucose-Capillary: 160 mg/dL — ABNORMAL HIGH (ref 65–99)

## 2017-03-19 SURGERY — VIDEO BRONCHOSCOPY WITHOUT FLUORO
Anesthesia: Moderate Sedation | Laterality: Bilateral

## 2017-03-19 NOTE — Progress Notes (Signed)
PROGRESS NOTE    Shaun Brennan  YYF:[id number] DOB: [birth year] DOA: 03/12/2017 PCP: Patient, No Pcp Per    Brief Narrative:  41 year old male who presented with dyspnea. Patient does have significant past medical history of HIV, type 2 diabetes mellitus, systolic heart failure, and hypertension. Complaint of 48 hours of her tic cough, chills, diaphoresis,chest pain and dyspnea. Worsening symptoms over last 24 hours prior to hospitalization. Questionable noncompliance with antiretroviral therapy. On his initial physical examination blood pressure 164/110, heart rate 96, temperature 98.2, respiratory 24, oxygen saturation 93% on supplemental oxygen. He was noted to be in respiratory distress, increased work of breathing, his lungs had decreased breath sounds bilaterally, scattered rails, wheezing, bilaterally. Heart S1-S2 present and rhythmic, the abdomen was soft nontender, no lower extremity edema. Chest x-ray with faint opacity of the right lower lobe and left upper lobe. Chest CT with bilateral groundglass opacities, more predominantly on the left upper lobe.   Patient was admitted to hospital working diagnosis acute hypoxic respiratory failure due to community-acquired pneumonia in the setting of immunosuppression, due to HIV/ AIDS.  Bronchoscopy has been cancelled, will continue empiric therapy with Bactrim for 21 days and taper steroids, plan for discharge in am if continue clinical improvement.   Assessment & Plan:   Principal Problem:   Acute respiratory distress Active Problems:   DM2 (diabetes mellitus, type 2) (HCC)   HIV (human immunodeficiency virus infection) (HCC)   HTN (hypertension)   Chronic systolic heart failure (HCC)   Pneumonia   Alcohol abuse with alcohol-induced mood disorder (HCC)   AIDS (HCC)   1. Acute hypoxic respiratory failure.Continue clinical improvement, patient oxygenating well on room air, out of bed as tolerated and ambulate in the hall.   2.  Multifocal pneumonia. Clinically improving, will follow pulmonary and ID recommendations, to treat empiric with bactrim and levofloxacin, taper steroids. Bronchoscopy has been canceled, will plan to discharge patient home in am if continue clinical improvement.    3. HIV/ AIDSCD 4 at 150 and 22% with HIV quant < 20.On antiretroviral therapy:dolutegravir, descovy, with good toleration.   4. Type 2 diabetes mellitus.Improvec capillary glucose this am 176 and 160, will continue insulin sliding scale for glucose cover and monitoring, continue basal insulin regimen, will plan to continue taper steroids.   5. HTN.On carvedilol, hydralazine and isosorbide. Systolic blood pressure 135 to 140.   DVT prophylaxis:heparin Code Status:full Family Communication: Disposition Plan:   Consultants:    Procedures:    Antimicrobials   Subjective: Patient feeling better, dyspnea has improved as well as cough, no nausea or vomiting, no chest pain. Has been NPO after midnight for bronchoscopy this am.   Objective: Vitals:   03/18/17 1246 03/18/17 1701 03/18/17 2000 03/19/17 0541  BP: 113/77 128/82 140/80 121/73  Pulse:    80  Resp:   16 18  Temp: 98.2 F (36.8 C) 97.9 F (36.6 C) 98.8 F (37.1 C) 98.2 F (36.8 C)  TempSrc: Oral Oral Oral Oral  SpO2: 98% 99% 98% 99%  Weight:    89.2 kg (196 lb 10.4 oz)  Height:        Intake/Output Summary (Last 24 hours) at 03/19/2017 0835 Last data filed at 03/18/2017 2000 Gross per 24 hour  Intake 240 ml  Output 1925 ml  Net -1685 ml   Filed Weights   03/17/17 0424 03/18/17 0502 03/19/17 0541  Weight: 87.5 kg (192 lb 12.8 oz) 88 kg (194 lb) 89.2 kg (196 lb 10.4 oz)  Examination:   General: Not in pain or dyspnea, deconditioned Neurology: Awake and alert, non focal  E ENT: mild pallor, no icterus, oral mucosa moist Cardiovascular: No JVD. S1-S2 present, rhythmic, no gallops, rubs, or murmurs. No lower extremity  edema. Pulmonary: vesicular breath sounds bilaterally, adequate air movement, no wheezing, rhonchi, scattered rales at bases.  Gastrointestinal. Abdomen flat, no organomegaly, non tender, no rebound or guarding Skin. No rashes Musculoskeletal: no joint deformities     Data Reviewed: I have personally reviewed following labs and imaging studies  CBC: Recent Labs  Lab 03/15/17 0523 03/16/17 0334 03/17/17 0331 03/18/17 0427 03/19/17 0339  WBC 8.2 8.0 8.9 10.7* 8.3  NEUTROABS 6.8 6.5 7.1 7.8*  --   HGB 12.6* 13.5 14.1 13.7 13.4  HCT 37.0* 39.5 40.2 38.8* 39.3  MCV 96.4 97.3 95.0 95.6 95.9  PLT 157 158 160 156 158   Basic Metabolic Panel: Recent Labs  Lab 03/15/17 0523 03/16/17 0334 03/17/17 0331 03/18/17 0427 03/19/17 0339  NA 123* 125* 128* 130* 129*  K 5.2* 4.6 5.0 4.8 4.4  CL 90* 95* 97* 96* 96*  CO2 23 20* 22 26 25   GLUCOSE 543* 383* 328* 253* 238*  BUN 29* 33* 26* 26* 26*  CREATININE 1.51* 1.52* 1.37* 1.34* 1.27*  CALCIUM 9.3 9.4 9.6 9.4 9.0   GFR: Estimated Creatinine Clearance: 79.4 mL/min (A) (by C-G formula based on SCr of 1.27 mg/dL (H)). Liver Function Tests: Recent Labs  Lab 03/12/17 1448  AST 36  ALT 42  ALKPHOS 95  BILITOT 0.5  PROT 7.8  ALBUMIN 3.5   No results for input(s): LIPASE, AMYLASE in the last 168 hours. No results for input(s): AMMONIA in the last 168 hours. Coagulation Profile: No results for input(s): INR, PROTIME in the last 168 hours. Cardiac Enzymes: Recent Labs  Lab 03/12/17 1448  TROPONINI 0.09*   BNP (last 3 results) No results for input(s): PROBNP in the last 8760 hours. HbA1C: No results for input(s): HGBA1C in the last 72 hours. CBG: Recent Labs  Lab 03/17/17 2031 03/18/17 0748 03/18/17 1134 03/18/17 1627 03/18/17 2120  GLUCAP 166* 298* 274* 333* 393*   Lipid Profile: No results for input(s): CHOL, HDL, LDLCALC, TRIG, CHOLHDL, LDLDIRECT in the last 72 hours. Thyroid Function Tests: No results for  input(s): TSH, T4TOTAL, FREET4, T3FREE, THYROIDAB in the last 72 hours. Anemia Panel: No results for input(s): VITAMINB12, FOLATE, FERRITIN, TIBC, IRON, RETICCTPCT in the last 72 hours.    Radiology Studies: I have reviewed all of the imaging during this hospital visit personally     Scheduled Meds: . aspirin EC  81 mg Oral Daily  . atorvastatin  20 mg Oral Daily  . carvedilol  25 mg Oral BID  . dolutegravir  50 mg Oral Daily  . emtricitabine-tenofovir AF  1 tablet Oral Daily  . guaiFENesin-dextromethorphan  10 mL Oral Q6H  . hydrALAZINE  100 mg Oral TID  . insulin aspart  0-20 Units Subcutaneous TID WC  . insulin aspart  0-5 Units Subcutaneous QHS  . insulin aspart  6 Units Subcutaneous TID WC  . insulin glargine  20 Units Subcutaneous BID  . isosorbide mononitrate  60 mg Oral Daily  . predniSONE  40 mg Oral Q breakfast  . senna  1 tablet Oral BID  . sodium chloride flush  3 mL Intravenous Q12H  . sulfamethoxazole-trimethoprim  2 tablet Oral TID   Continuous Infusions: . sodium chloride       LOS: 7 days  Lawayne Hartig Gerome Apley, MD [facility] Pager [phone]

## 2017-03-19 NOTE — Progress Notes (Signed)
INFECTIOUS DISEASE PROGRESS NOTE  ID: Shaun Brennan is a 41 y.o. male with  Principal Problem:   Acute respiratory distress Active Problems:   DM2 (diabetes mellitus, type 2) (HCC)   HIV (human immunodeficiency virus infection) (HCC)   HTN (hypertension)   Chronic systolic heart failure (HCC)   Pneumonia   Alcohol abuse with alcohol-induced mood disorder (HCC)   AIDS (HCC)  Subjective: No c/o Denies SOB with ambulating to bathroom.   Abtx:  Anti-infectives (From admission, onward)   Start     Dose/Rate Route Frequency Ordered Stop   03/15/17 1800  levofloxacin (LEVAQUIN) tablet 750 mg     750 mg Oral Every 24 hours 03/15/17 0859 03/18/17 1701   03/15/17 1000  sulfamethoxazole-trimethoprim (BACTRIM DS,SEPTRA DS) 800-160 MG per tablet 2 tablet     2 tablet Oral 3 times daily 03/15/17 0857     03/13/17 0200  sulfamethoxazole-trimethoprim (BACTRIM) 452.8 mg in dextrose 5 % 500 mL IVPB  Status:  Discontinued     15 mg/kg/day  90.7 kg 352.2 mL/hr over 90 Minutes Intravenous Every 8 hours 03/12/17 1824 03/15/17 0856   03/12/17 1800  levofloxacin (LEVAQUIN) IVPB 750 mg  Status:  Discontinued     750 mg 100 mL/hr over 90 Minutes Intravenous Every 24 hours 03/12/17 1730 03/15/17 0858   03/12/17 1730  sulfamethoxazole-trimethoprim (BACTRIM) 452.8 mg in dextrose 5 % 500 mL IVPB     5 mg/kg  90.7 kg 352.2 mL/hr over 90 Minutes Intravenous  Once 03/12/17 1625 03/12/17 1859   03/12/17 1730  dolutegravir (TIVICAY) tablet 50 mg     50 mg Oral Daily 03/12/17 1719     03/12/17 1730  emtricitabine-tenofovir AF (DESCOVY) 200-25 MG per tablet 1 tablet     1 tablet Oral Daily 03/12/17 1719        Medications:  Scheduled: . aspirin EC  81 mg Oral Daily  . atorvastatin  20 mg Oral Daily  . carvedilol  25 mg Oral BID  . dolutegravir  50 mg Oral Daily  . emtricitabine-tenofovir AF  1 tablet Oral Daily  . guaiFENesin-dextromethorphan  10 mL Oral Q6H  . hydrALAZINE  100 mg Oral TID  .  insulin aspart  0-20 Units Subcutaneous TID WC  . insulin aspart  0-5 Units Subcutaneous QHS  . insulin aspart  6 Units Subcutaneous TID WC  . insulin glargine  20 Units Subcutaneous BID  . isosorbide mononitrate  60 mg Oral Daily  . predniSONE  40 mg Oral Q breakfast  . senna  1 tablet Oral BID  . sodium chloride flush  3 mL Intravenous Q12H  . sulfamethoxazole-trimethoprim  2 tablet Oral TID    Objective: Vital signs in last 24 hours: Temp:  [97.9 F (36.6 C)-98.8 F (37.1 C)] 98.7 F (37.1 C) (12/10 1100) Pulse Rate:  [80-85] 85 (12/10 1100) Resp:  [16-18] 18 (12/10 1100) BP: (94-140)/(64-82) 94/64 (12/10 1100) SpO2:  [97 %-99 %] 97 % (12/10 1100) Weight:  [89.2 kg (196 lb 10.4 oz)] 89.2 kg (196 lb 10.4 oz) (12/10 0541)   General appearance: alert, cooperative and no distress Resp: clear to auscultation bilaterally Cardio: regular rate and rhythm GI: normal findings: bowel sounds normal and soft, non-tender Extremities: edema none  Lab Results Recent Labs    03/18/17 0427 03/19/17 0339  WBC 10.7* 8.3  HGB 13.7 13.4  HCT 38.8* 39.3  NA 130* 129*  K 4.8 4.4  CL 96* 96*  CO2 26 25  BUN  26* 26*  CREATININE 1.34* 1.27*   Liver Panel No results for input(s): PROT, ALBUMIN, AST, ALT, ALKPHOS, BILITOT, BILIDIR, IBILI in the last 72 hours. Sedimentation Rate No results for input(s): ESRSEDRATE in the last 72 hours. C-Reactive Protein No results for input(s): CRP in the last 72 hours.  Microbiology: Recent Results (from the past 240 hour(s))  Blood culture (routine x 2)     Status: None   Collection Time: 03/12/17  2:49 PM  Result Value Ref Range Status   Specimen Description BLOOD RIGHT ANTECUBITAL  Final   Special Requests   Final    BOTTLES DRAWN AEROBIC AND ANAEROBIC Blood Culture adequate volume   Culture NO GROWTH 5 DAYS  Final   Report Status 03/17/2017 FINAL  Final  Blood culture (routine x 2)     Status: None   Collection Time: 03/12/17  2:50 PM    Result Value Ref Range Status   Specimen Description BLOOD LEFT ANTECUBITAL  Final   Special Requests   Final    BOTTLES DRAWN AEROBIC AND ANAEROBIC Blood Culture adequate volume   Culture NO GROWTH 5 DAYS  Final   Report Status 03/17/2017 FINAL  Final  MRSA PCR Screening     Status: None   Collection Time: 03/13/17  2:02 PM  Result Value Ref Range Status   MRSA by PCR NEGATIVE NEGATIVE Final    Comment:        The GeneXpert MRSA Assay (FDA approved for NASAL specimens only), is one component of a comprehensive MRSA colonization surveillance program. It is not intended to diagnose MRSA infection nor to guide or monitor treatment for MRSA infections.   Respiratory Panel by PCR     Status: None   Collection Time: 03/13/17  3:15 PM  Result Value Ref Range Status   Adenovirus NOT DETECTED NOT DETECTED Final   Coronavirus 229E NOT DETECTED NOT DETECTED Final   Coronavirus HKU1 NOT DETECTED NOT DETECTED Final   Coronavirus NL63 NOT DETECTED NOT DETECTED Final   Coronavirus OC43 NOT DETECTED NOT DETECTED Final   Metapneumovirus NOT DETECTED NOT DETECTED Final   Rhinovirus / Enterovirus NOT DETECTED NOT DETECTED Final   Influenza A NOT DETECTED NOT DETECTED Final   Influenza B NOT DETECTED NOT DETECTED Final   Parainfluenza Virus 1 NOT DETECTED NOT DETECTED Final   Parainfluenza Virus 2 NOT DETECTED NOT DETECTED Final   Parainfluenza Virus 3 NOT DETECTED NOT DETECTED Final   Parainfluenza Virus 4 NOT DETECTED NOT DETECTED Final   Respiratory Syncytial Virus NOT DETECTED NOT DETECTED Final   Bordetella pertussis NOT DETECTED NOT DETECTED Final   Chlamydophila pneumoniae NOT DETECTED NOT DETECTED Final   Mycoplasma pneumoniae NOT DETECTED NOT DETECTED Final    Studies/Results: No results found.   Assessment/Plan: AIDS  Pneumonia ?CHF  Total days of antibiotics: 6 bactrim/prednisone  Would taper off prednisone Complete 21 days of po bactrim He completed levaquin  yesterday No PCP stain found out 15L Can be d/c home  Available as needed.            [name] MD, FACP Infectious Diseases (pager) [phone] [email or web address] 03/19/2017, 1:42 PM  LOS: 7 days

## 2017-03-19 NOTE — Social Work (Addendum)
CSW provided additional food vouchers for pt partner, as indicated by previous notes, pt and pt partner are homeless and currently do not have financial resources for food.   CSW signing off. Please consult if any additional needs arise.  Doy Hutching, LCSWA [facility] Clinical Social Work [phone]

## 2017-03-19 NOTE — Progress Notes (Signed)
Physical Therapy Evaluation Patient Details Name: Shaun Brennan MRN: [id number] DOB: [birth year] Today's Date: 03/19/2017   History of Present Illness  Shaun Brennan is a 41 y/o male with a PMHx significant for   HIV positive status, diabetes, reduced EF CHF and hypertension who presented with productive cough, chills and diaphoresis, shortness of breath, and chest pain associated with cough for the last couple of days.   Clinical Impression  Patient admitted with the above. Patient willing to participate with PT this afternoon with good motivation. 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 Patient performing all transfers and gait with Mod I to Sup for general safety. Patient ambulating within hallway with SPC with no LOB noted. No other PT needs identified at this time - PT to sign off.     Follow Up Recommendations No PT follow up    Equipment Recommendations  None recommended by PT    Recommendations for Other Services       Precautions / Restrictions Precautions Precautions: Fall Restrictions Weight Bearing Restrictions: No      Mobility  Bed Mobility Overal bed mobility: Modified Independent             General bed mobility comments: very quick to sit up to EOB  Transfers Overall transfer level: Modified independent Equipment used: None             General transfer comment: no noted LOB  Ambulation/Gait Ambulation/Gait assistance: Supervision Ambulation Distance (Feet): 200 Feet Assistive device: Straight cane Gait Pattern/deviations: Step-through pattern        Stairs            Wheelchair Mobility    Modified Rankin (Stroke Patients Only)       Balance Overall balance assessment: Modified Independent                                           Pertinent Vitals/Pain Pain Assessment: No/denies pain    Home Living Family/patient expects to  be discharged to:: Unsure                 Additional Comments: per medical chart, patient homeless    Prior Function Level of Independence: Independent with assistive device(s)         Comments: intermittent use of SPC     Hand Dominance        Extremity/Trunk Assessment   Upper Extremity Assessment Upper Extremity Assessment: Overall WFL for tasks assessed    Lower Extremity Assessment Lower Extremity Assessment: Overall WFL for tasks assessed       Communication   Communication: No difficulties  Cognition Arousal/Alertness: Awake/alert Behavior During Therapy: WFL for tasks assessed/performed Overall Cognitive Status: Within Functional Limits for tasks assessed                                        General Comments      Exercises     Assessment/Plan    PT Assessment Shaun Brennan does not need any further PT services  PT Problem List         PT Treatment Interventions      PT Goals (Current goals can be found in the Care Plan section)  Acute Rehab PT Goals Patient  Stated Goal: none stated by pt PT Goal Formulation: With patient Time For Goal Achievement: 04/02/17 Potential to Achieve Goals: Good    Frequency     Barriers to discharge        Co-evaluation               AM-PAC PT "6 Clicks" Daily Activity  Outcome Measure Difficulty turning over in bed (including adjusting bedclothes, sheets and blankets)?: None Difficulty moving from lying on back to sitting on the side of the bed? : None Difficulty sitting down on and standing up from a chair with arms (e.g., wheelchair, bedside commode, etc,.)?: None Help needed moving to and from a bed to chair (including a wheelchair)?: None Help needed walking in hospital room?: None Help needed climbing 3-5 steps with a railing? : None 6 Click Score: 24    End of Session Equipment Utilized During Treatment: Gait belt Activity Tolerance: Patient tolerated treatment  well Patient left: in bed;with call bell/phone within reach;with family/visitor present Nurse Communication: Mobility status PT Visit Diagnosis: Unsteadiness on feet (R26.81);Muscle weakness (generalized) (M62.81)    Time: 3546-5681 PT Time Calculation (min) (ACUTE ONLY): 19 min   Charges:   PT Evaluation $PT Eval Low Complexity: 1 Low     Kipp Laurence, PT, DPT 03/19/17 2:44 PM

## 2017-03-19 NOTE — Progress Notes (Signed)
Inpatient Diabetes Program Recommendations  AACE/ADA: New Consensus Statement on Inpatient Glycemic Control (2015)  Target Ranges:  Prepandial:   less than 140 mg/dL      Peak postprandial:   less than 180 mg/dL (1-2 hours)      Critically ill patients:  140 - 180 mg/dL   Results for Shaun Brennan, Shaun Brennan (MRN [id number]) as of 03/19/2017 09:17  Ref. Range 03/18/2017 07:48 03/18/2017 11:34 03/18/2017 16:27 03/18/2017 21:20 03/19/2017 09:06  Glucose-Capillary Latest Ref Range: 65 - 99 mg/dL 841 (H) 324 (H) 401 (H) 393 (H) 176 (H)   Review of Glycemic Control  Diabetes history: Type 2 DM Outpatient Diabetes medications: 70/30 15 units bid, Metformin 1000 mg bid Current orders for Inpatient glycemic control:  Novolog 6 units tid with meals (just added 12/9) Novolog resistant tid with meals and HS Lantus 20 units bid Prednisone 40 mg daily  Inpatient Diabetes Program Recommendations:    Am glucose 176 this am. Patient to get PO prednisone this am. Patient has meal coverage added. Expecting trends to lower.   Thanks, Christena Deem RN, MSN, [facility] Inpatient Diabetes Coordinator Team Pager [phone] (8a-5p)

## 2017-03-19 NOTE — Progress Notes (Signed)
[facility] PCCM  S: feels better, cough has improved  O:  Vitals:   03/18/17 1246 03/18/17 1701 03/18/17 2000 03/19/17 0541  BP: 113/77 128/82 140/80 121/73  Pulse:    80  Resp:   16 18  Temp: 98.2 F (36.8 C) 97.9 F (36.6 C) 98.8 F (37.1 C) 98.2 F (36.8 C)  TempSrc: Oral Oral Oral Oral  SpO2: 98% 99% 98% 99%  Weight:    196 lb 10.4 oz (89.2 kg)  Height:       RA  General:  Resting comfortably in bed HENT: NCAT OP clear PULM: CTA B, normal effort CV: RRR, no mgr GI: BS+, soft, nontender MSK: normal bulk and tone Neuro: awake, alert, no distress, MAEW  CBC    Component Value Date/Time   WBC 8.3 03/19/2017 0339   RBC 4.10 (L) 03/19/2017 0339   HGB 13.4 03/19/2017 0339   HCT 39.3 03/19/2017 0339   PLT 158 03/19/2017 0339   MCV 95.9 03/19/2017 0339   MCH 32.7 03/19/2017 0339   MCHC 34.1 03/19/2017 0339   RDW 13.1 03/19/2017 0339   LYMPHSABS 1.3 03/18/2017 0427   MONOABS 1.6 (H) 03/18/2017 0427   EOSABS 0.0 03/18/2017 0427   BASOSABS 0.0 03/18/2017 0427   BMET    Component Value Date/Time   NA 129 (L) 03/19/2017 0339   K 4.4 03/19/2017 0339   CL 96 (L) 03/19/2017 0339   CO2 25 03/19/2017 0339   GLUCOSE 238 (H) 03/19/2017 0339   BUN 26 (H) 03/19/2017 0339   CREATININE 1.27 (H) 03/19/2017 0339   CREATININE 1.49 (H) 05/11/2016 1659   CALCIUM 9.0 03/19/2017 0339   GFRNONAA >60 03/19/2017 0339   GFRNONAA 58 (L) 05/11/2016 1659   GFRAA >60 03/19/2017 0339   GFRAA 67 05/11/2016 1659   Impression/plan:  CAP in the setting of low CD4 count and undetectable HIV viral load:improving with Levaquin and bactrim.  Unfortunately we don't have a firm diagnosis or a negative respiratory PCP/PJP assessment.  However on treatment day #8 with significant improvement in his oxygenation and symptoms, a bronchoscopy will be of little diagnostic value.  Would not anticipate PCP lab testing to be positive at this point.  Would suggest completing 14-day course of antibiotics with  Bactrim, will defer to infectious disease.  Cancel bronchoscopy.  Explained to patient and his partner today.  Discussed with infectious diseases.  Pulmonary and critical care medicine will sign off  [place], MD [place] PCCM Pager: [phone] Cell: [phone] After 3pm or if no response, call [phone]

## 2017-03-20 DIAGNOSIS — B59 Pneumocystosis: Secondary | ICD-10-CM

## 2017-03-20 DIAGNOSIS — Z794 Long term (current) use of insulin: Secondary | ICD-10-CM

## 2017-03-20 DIAGNOSIS — I10 Essential (primary) hypertension: Secondary | ICD-10-CM

## 2017-03-20 LAB — CBC WITH DIFFERENTIAL/PLATELET
BASOS ABS: 0 10*3/uL (ref 0.0–0.1)
Basophils Relative: 0 %
EOS PCT: 1 %
Eosinophils Absolute: 0.1 10*3/uL (ref 0.0–0.7)
HEMATOCRIT: 37.3 % — AB (ref 39.0–52.0)
Hemoglobin: 12.8 g/dL — ABNORMAL LOW (ref 13.0–17.0)
LYMPHS ABS: 1.7 10*3/uL (ref 0.7–4.0)
LYMPHS PCT: 24 %
MCH: 33.2 pg (ref 26.0–34.0)
MCHC: 34.3 g/dL (ref 30.0–36.0)
MCV: 96.6 fL (ref 78.0–100.0)
MONO ABS: 1.3 10*3/uL — AB (ref 0.1–1.0)
MONOS PCT: 18 %
Neutro Abs: 4.1 10*3/uL (ref 1.7–7.7)
Neutrophils Relative %: 57 %
PLATELETS: 165 10*3/uL (ref 150–400)
RBC: 3.86 MIL/uL — ABNORMAL LOW (ref 4.22–5.81)
RDW: 13.3 % (ref 11.5–15.5)
WBC: 7.2 10*3/uL (ref 4.0–10.5)

## 2017-03-20 LAB — BASIC METABOLIC PANEL
Anion gap: 8 (ref 5–15)
BUN: 26 mg/dL — AB (ref 6–20)
CO2: 26 mmol/L (ref 22–32)
Calcium: 8.8 mg/dL — ABNORMAL LOW (ref 8.9–10.3)
Chloride: 95 mmol/L — ABNORMAL LOW (ref 101–111)
Creatinine, Ser: 1.37 mg/dL — ABNORMAL HIGH (ref 0.61–1.24)
GFR calc Af Amer: >60 mL/min (ref >60)
Glucose, Bld: 220 mg/dL — ABNORMAL HIGH (ref 65–99)
POTASSIUM: 4.2 mmol/L (ref 3.5–5.1)
Sodium: 129 mmol/L — ABNORMAL LOW (ref 135–145)

## 2017-03-20 LAB — GLUCOSE, CAPILLARY
Glucose-Capillary: 210 mg/dL — ABNORMAL HIGH (ref 65–99)
Glucose-Capillary: 154 mg/dL — ABNORMAL HIGH (ref 65–99)

## 2017-03-20 MED ORDER — INSULIN NPH ISOPHANE & REGULAR (70-30) 100 UNIT/ML ~~LOC~~ SUSP: 10.0000 [IU] | Freq: Two times a day (BID) | SUBCUTANEOUS | 0 refills | Status: DC

## 2017-03-20 MED ORDER — METFORMIN HCL 500 MG PO TABS: 1000.0000 mg | ORAL_TABLET | Freq: Two times a day (BID) | ORAL | 0 refills | Status: DC

## 2017-03-20 MED ORDER — BLOOD GLUCOSE METER KIT: PACK | 0 refills | Status: AC

## 2017-03-20 MED ORDER — FUROSEMIDE 40 MG PO TABS: 40.0000 mg | ORAL_TABLET | Freq: Every day | ORAL | 0 refills | Status: DC

## 2017-03-20 MED ORDER — PREDNISONE 20 MG PO TABS: ORAL_TABLET | ORAL | 0 refills | Status: DC

## 2017-03-20 MED ORDER — GUAIFENESIN-DM 100-10 MG/5ML PO SYRP: 5.0000 mL | ORAL_SOLUTION | Freq: Four times a day (QID) | ORAL | 0 refills | Status: DC | PRN

## 2017-03-20 MED ORDER — "NEEDLE (DISP) 28G X 1/2"" MISC": 1.0000 | Freq: Two times a day (BID) | 0 refills | Status: DC

## 2017-03-20 MED ORDER — SULFAMETHOXAZOLE-TRIMETHOPRIM 800-160 MG PO TABS: 2.0000 | ORAL_TABLET | Freq: Three times a day (TID) | ORAL | 0 refills | Status: AC

## 2017-03-20 MED FILL — NOVOLIN 70/30 100 UNITS/ML: (70-30) 100 | 30 days supply | Qty: 10 | Fill #0

## 2017-03-20 MED FILL — SULFAMETHOXAZOLE/TMP DS TAB: 800-160 | 17 days supply | Qty: 102 | Fill #0

## 2017-03-20 MED FILL — metFORMIN HCL 500 MG TABS: 500 | 30 days supply | Qty: 120 | Fill #0

## 2017-03-20 MED FILL — FREESTYLE LANCETS: 25 days supply | Qty: 100 | Fill #0

## 2017-03-20 MED FILL — FUROSEMIDE 40 MG TAB: 40 | 30 days supply | Qty: 30 | Fill #0

## 2017-03-20 MED FILL — ULTICARE SYR 0.3 ML 30GX5/1: 30G X 5/16" | 30 days supply | Qty: 60 | Fill #0

## 2017-03-20 MED FILL — predniSONE 20 MG TABS: 20 | 17 days supply | Qty: 19 | Fill #0

## 2017-03-20 MED FILL — FREESTYLE LITE METER: 1 days supply | Qty: 1 | Fill #0

## 2017-03-20 MED FILL — FREESTYLE LITE TEST STRIP: 25 days supply | Qty: 100 | Fill #0

## 2017-03-20 NOTE — Progress Notes (Signed)
CSW called [employer] and confirmed [facility] will remain open for emergency winter shelter tonight. CSW met with patient and significant other at bedside. CSW reminded them of winter shelter and offered address and contact information again. Significant other stated he still has IRC packet CSW provided last week. CSW provided bus passes for patient and significant other and left passes on chart for RN to give with discharge paperwork. CSW signing off.  Shaun Brennan, [place]

## 2017-03-20 NOTE — Progress Notes (Signed)
Patient is alert and oriented x4, able to make needs known.  RN asked patient if he wanted RN to routinely check on him through the night or if he wanted to call for RN as needed.  Patient stated he would just call for RN as needed.  RN explained to patient that he had scheduled Robitussin at 0000 & 0600.  Patient stated he did not feel like he needed it at scheduled times but would notify RN if that changed.Marland Kitchen

## 2017-03-20 NOTE — Progress Notes (Addendum)
  03/20/2017 1206 NCM provided pt with MATCH with a $3 copay to utilize once per year. Explained to pt that he will not be able to utilize until next year. Pt will need to pick up meds from the [facility]. Pt has heart meds, HIV and HIV meds. His heart meds go through the Heart Failure program at [facility].   Isidoro Donning RN CCM Case Mgmt phone [phone]   Pt usually goes to [facility] to get medications. Currently pharmacy is closed due to inclement weather. Pt is homeless and will need assistance with meds. Waiting approval for MATCH, with copay override. Pt used MATCH in 3/18. Isidoro Donning RN CCM Case Mgmt phone [phone]

## 2017-03-20 NOTE — Discharge Summary (Signed)
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 Recent Labs    06/11/16 1757 10/03/16 1003 03/12/17 1327  BNP 1,688.9* 93.9 710.6*   Basic Metabolic Panel: Recent Labs  Lab 03/16/17 0334 03/17/17 0331 03/18/17 0427 03/19/17 0339 03/20/17 0436  NA 125* 128* 130* 129* 129*  K 4.6 5.0 4.8 4.4 4.2  CL 95* 97* 96* 96* 95*  CO2 20* '22 26 25 26  '$ GLUCOSE 383* 328* 253* 238* 220*  BUN 33* 26* 26* 26* 26*  CREATININE 1.52* 1.37* 1.34* 1.27* 1.37*  CALCIUM 9.4 9.6 9.4 9.0 8.8*   Liver Function Tests: No results for input(s): AST, ALT, ALKPHOS, BILITOT, PROT, ALBUMIN in the last 168 hours. No results for input(s): LIPASE, AMYLASE in the last 168 hours. No results for input(s): AMMONIA in the last 168 hours. CBC: Recent Labs  Lab 03/15/17 0523 03/16/17 0334 03/17/17 0331 03/18/17 0427 03/19/17 0339 03/20/17 0436  WBC 8.2 8.0 8.9 10.7* 8.3 7.2  NEUTROABS 6.8 6.5 7.1 7.8*  --  4.1  HGB 12.6* 13.5 14.1 13.7 13.4 12.8*  HCT 37.0* 39.5 40.2 38.8* 39.3 37.3*  MCV 96.4 97.3 95.0 95.6 95.9 96.6  PLT 157  158 160 156 158 165   Cardiac Enzymes: No results for input(s): CKTOTAL, CKMB, CKMBINDEX, TROPONINI in the last 168 hours. BNP: Invalid input(s): POCBNP CBG: Recent Labs  Lab 03/19/17 0906 03/19/17 1142 03/19/17 1626 03/19/17 2210 03/20/17 0822  GLUCAP 176* 160* 365* 270* 210*   D-Dimer No results for input(s): DDIMER in the last 72 hours. Hgb A1c No results for input(s): HGBA1C in the last 72 hours. Lipid Profile No results for input(s): CHOL, HDL, LDLCALC, TRIG, CHOLHDL, LDLDIRECT in the last 72  hours. Thyroid function studies No results for input(s): TSH, T4TOTAL, T3FREE, THYROIDAB in the last 72 hours.  Invalid input(s): FREET3 Anemia work up No results for input(s): VITAMINB12, FOLATE, FERRITIN, TIBC, IRON, RETICCTPCT in the last 72 hours. Urinalysis    Component Value Date/Time   COLORURINE YELLOW 10/03/2016 1234   APPEARANCEUR CLEAR 10/03/2016 1234   LABSPEC 1.030 10/03/2016 1234   PHURINE 5.0 10/03/2016 1234   GLUCOSEU >=500 (A) 10/03/2016 1234   HGBUR SMALL (A) 10/03/2016 1234   BILIRUBINUR NEGATIVE 10/03/2016 1234   KETONESUR NEGATIVE 10/03/2016 1234   PROTEINUR 100 (A) 10/03/2016 1234   UROBILINOGEN 0.2 12/20/2013 1321   NITRITE NEGATIVE 10/03/2016 1234   LEUKOCYTESUR NEGATIVE 10/03/2016 1234   Sepsis Labs Invalid input(s): PROCALCITONIN,  WBC,  LACTICIDVEN Microbiology Recent Results (from the past 240 hour(s))  Blood culture (routine x 2)     Status: None   Collection Time: 03/12/17  2:49 PM  Result Value Ref Range Status   Specimen Description BLOOD RIGHT ANTECUBITAL  Final   Special Requests   Final    BOTTLES DRAWN AEROBIC AND ANAEROBIC Blood Culture adequate volume   Culture NO GROWTH 5 DAYS  Final   Report Status 03/17/2017 FINAL  Final  Blood culture (routine x 2)     Status: None   Collection Time: 03/12/17  2:50 PM  Result Value Ref Range Status   Specimen Description BLOOD LEFT ANTECUBITAL  Final   Special Requests   Final    BOTTLES  DRAWN AEROBIC AND ANAEROBIC Blood Culture adequate volume   Culture NO GROWTH 5 DAYS  Final   Report Status 03/17/2017 FINAL  Final  MRSA PCR Screening     Status: None   Collection Time: 03/13/17  2:02 PM  Result Value Ref Range Status   MRSA by PCR NEGATIVE NEGATIVE Final    Comment:        The GeneXpert MRSA Assay (FDA approved for NASAL specimens only), is one component of a comprehensive MRSA colonization surveillance program. It is not intended to diagnose MRSA infection nor to guide or monitor treatment for MRSA infections.   Respiratory Panel by PCR     Status: None   Collection Time: 03/13/17  3:15 PM  Result Value Ref Range Status   Adenovirus NOT DETECTED NOT DETECTED Final   Coronavirus 229E NOT DETECTED NOT DETECTED Final   Coronavirus HKU1 NOT DETECTED NOT DETECTED Final   Coronavirus NL63 NOT DETECTED NOT DETECTED Final   Coronavirus OC43 NOT DETECTED NOT DETECTED Final   Metapneumovirus NOT DETECTED NOT DETECTED Final   Rhinovirus / Enterovirus NOT DETECTED NOT DETECTED Final   Influenza A NOT DETECTED NOT DETECTED Final   Influenza B NOT DETECTED NOT DETECTED Final   Parainfluenza Virus 1 NOT DETECTED NOT DETECTED Final   Parainfluenza Virus 2 NOT DETECTED NOT DETECTED Final   Parainfluenza Virus 3 NOT DETECTED NOT DETECTED Final   Parainfluenza Virus 4 NOT DETECTED NOT DETECTED Final   Respiratory Syncytial Virus NOT DETECTED NOT DETECTED Final   Bordetella pertussis NOT DETECTED NOT DETECTED Final   Chlamydophila pneumoniae NOT DETECTED NOT DETECTED Final   Mycoplasma pneumoniae NOT DETECTED NOT DETECTED Final     Time coordinating discharge: 45 minutes  SIGNED:   Tawni Millers, MD  [facility] 03/20/2017, 8:34 AM Pager [phone]  If 7PM-7AM, please contact night-coverage [email or web address] Password [email or web address]

## 2017-03-20 NOTE — Progress Notes (Signed)
Right antecubital IV removed due to inability to flush.  Patient refused to have new IV placed due to probability of being discharged tomorrow.  RN educated patient on purpose of having an IV while in the hospital.  Patient stated understanding and continued refusal.

## 2017-03-26 ENCOUNTER — Ambulatory Visit: Payer: Self-pay | Admitting: Infectious Diseases

## 2017-03-28 ENCOUNTER — Inpatient Hospital Stay: Payer: Self-pay | Admitting: Critical Care Medicine

## 2017-05-08 MED FILL — hydrALAZINE HCL 50 MG TABS: 50 | 34 days supply | Qty: 68 | Fill #1

## 2017-05-08 MED FILL — CARVEDILOL 25 MG TABLET: 25 | 34 days supply | Qty: 68 | Fill #1

## 2017-05-08 MED FILL — ISOSORBIDE MN ER 60 MG TAB: 60 | 34 days supply | Qty: 34 | Fill #1

## 2017-05-08 MED FILL — ATORVASTATIN 20 MG TABLET: 20 | 34 days supply | Qty: 34 | Fill #1

## 2017-05-11 DIAGNOSIS — J111 Influenza due to unidentified influenza virus with other respiratory manifestations: Secondary | ICD-10-CM

## 2017-05-11 HISTORY — DX: Influenza due to unidentified influenza virus with other respiratory manifestations: J11.1

## 2017-05-15 ENCOUNTER — Inpatient Hospital Stay (HOSPITAL_COMMUNITY)
Admission: EM | Admit: 2017-05-15 | Discharge: 2017-05-18 | DRG: 194 | Disposition: A | Discharge status: Home / Self Care | Payer: Self-pay | Attending: Oncology | Admitting: Oncology

## 2017-05-15 ENCOUNTER — Other Ambulatory Visit: Payer: Self-pay

## 2017-05-15 ENCOUNTER — Emergency Department (HOSPITAL_COMMUNITY): Payer: Self-pay

## 2017-05-15 ENCOUNTER — Encounter (HOSPITAL_COMMUNITY): Payer: Self-pay | Admitting: Emergency Medicine

## 2017-05-15 DIAGNOSIS — Z7984 Long term (current) use of oral hypoglycemic drugs: Secondary | ICD-10-CM

## 2017-05-15 DIAGNOSIS — E119 Type 2 diabetes mellitus without complications: Secondary | ICD-10-CM

## 2017-05-15 DIAGNOSIS — Z79899 Other long term (current) drug therapy: Secondary | ICD-10-CM

## 2017-05-15 DIAGNOSIS — F17210 Nicotine dependence, cigarettes, uncomplicated: Secondary | ICD-10-CM | POA: Diagnosis present

## 2017-05-15 DIAGNOSIS — R0602 Shortness of breath: Secondary | ICD-10-CM

## 2017-05-15 DIAGNOSIS — Z8701 Personal history of pneumonia (recurrent): Secondary | ICD-10-CM

## 2017-05-15 DIAGNOSIS — F1010 Alcohol abuse, uncomplicated: Secondary | ICD-10-CM | POA: Diagnosis present

## 2017-05-15 DIAGNOSIS — G479 Sleep disorder, unspecified: Secondary | ICD-10-CM | POA: Diagnosis present

## 2017-05-15 DIAGNOSIS — Z21 Asymptomatic human immunodeficiency virus [HIV] infection status: Secondary | ICD-10-CM

## 2017-05-15 DIAGNOSIS — B370 Candidal stomatitis: Secondary | ICD-10-CM | POA: Diagnosis present

## 2017-05-15 DIAGNOSIS — I447 Left bundle-branch block, unspecified: Secondary | ICD-10-CM | POA: Diagnosis present

## 2017-05-15 DIAGNOSIS — F1099 Alcohol use, unspecified with unspecified alcohol-induced disorder: Secondary | ICD-10-CM

## 2017-05-15 DIAGNOSIS — R0902 Hypoxemia: Secondary | ICD-10-CM | POA: Diagnosis present

## 2017-05-15 DIAGNOSIS — E1165 Type 2 diabetes mellitus with hyperglycemia: Secondary | ICD-10-CM | POA: Diagnosis present

## 2017-05-15 DIAGNOSIS — R6889 Other general symptoms and signs: Secondary | ICD-10-CM | POA: Diagnosis present

## 2017-05-15 DIAGNOSIS — J101 Influenza due to other identified influenza virus with other respiratory manifestations: Principal | ICD-10-CM | POA: Diagnosis present

## 2017-05-15 DIAGNOSIS — I110 Hypertensive heart disease with heart failure: Secondary | ICD-10-CM | POA: Diagnosis present

## 2017-05-15 DIAGNOSIS — B20 Human immunodeficiency virus [HIV] disease: Secondary | ICD-10-CM | POA: Diagnosis present

## 2017-05-15 DIAGNOSIS — I5042 Chronic combined systolic (congestive) and diastolic (congestive) heart failure: Secondary | ICD-10-CM | POA: Diagnosis present

## 2017-05-15 DIAGNOSIS — I429 Cardiomyopathy, unspecified: Secondary | ICD-10-CM

## 2017-05-15 DIAGNOSIS — Z888 Allergy status to other drugs, medicaments and biological substances status: Secondary | ICD-10-CM

## 2017-05-15 HISTORY — DX: Influenza due to unidentified influenza virus with other respiratory manifestations: J11.1

## 2017-05-15 LAB — URINALYSIS, ROUTINE W REFLEX MICROSCOPIC
BACTERIA UA: NONE SEEN
Bilirubin Urine: NEGATIVE
Glucose, UA: >=500 mg/dL — AB
Hgb urine dipstick: NEGATIVE
KETONES UR: NEGATIVE mg/dL
LEUKOCYTES UA: NEGATIVE
Nitrite: NEGATIVE
PH: 7 (ref 5.0–8.0)
Protein, ur: 30 mg/dL — AB
RBC / HPF: NONE SEEN RBC/hpf (ref 0–5)
SPECIFIC GRAVITY, URINE: 1.024 (ref 1.005–1.030)

## 2017-05-15 LAB — I-STAT CG4 LACTIC ACID, ED
LACTIC ACID, VENOUS: 2.55 mmol/L — AB (ref 0.5–1.9)
Lactic Acid, Venous: 1.81 mmol/L (ref 0.5–1.9)

## 2017-05-15 LAB — BASIC METABOLIC PANEL
Anion gap: 16 — ABNORMAL HIGH (ref 5–15)
BUN: 9 mg/dL (ref 6–20)
CALCIUM: 9.3 mg/dL (ref 8.9–10.3)
CO2: 24 mmol/L (ref 22–32)
CREATININE: 1.17 mg/dL (ref 0.61–1.24)
Chloride: 89 mmol/L — ABNORMAL LOW (ref 101–111)
GFR calc Af Amer: >60 mL/min (ref >60)
Glucose, Bld: 357 mg/dL — ABNORMAL HIGH (ref 65–99)
Potassium: 4.8 mmol/L (ref 3.5–5.1)
SODIUM: 129 mmol/L — AB (ref 135–145)

## 2017-05-15 LAB — HEPATIC FUNCTION PANEL
ALBUMIN: 3.1 g/dL — AB (ref 3.5–5.0)
ALT: 44 U/L (ref 17–63)
AST: 56 U/L — AB (ref 15–41)
Alkaline Phosphatase: 101 U/L (ref 38–126)
BILIRUBIN TOTAL: 0.7 mg/dL (ref 0.3–1.2)
Bilirubin, Direct: 0.2 mg/dL (ref 0.1–0.5)
Indirect Bilirubin: 0.5 mg/dL (ref 0.3–0.9)
Total Protein: 7.5 g/dL (ref 6.5–8.1)

## 2017-05-15 LAB — BRAIN NATRIURETIC PEPTIDE: B Natriuretic Peptide: 1107.7 pg/mL — ABNORMAL HIGH (ref 0.0–100.0)

## 2017-05-15 LAB — CBC
HCT: 42.5 % (ref 39.0–52.0)
Hemoglobin: 15.1 g/dL (ref 13.0–17.0)
MCH: 34.5 pg — ABNORMAL HIGH (ref 26.0–34.0)
MCHC: 35.5 g/dL (ref 30.0–36.0)
MCV: 97 fL (ref 78.0–100.0)
PLATELETS: 118 10*3/uL — AB (ref 150–400)
RBC: 4.38 MIL/uL (ref 4.22–5.81)
RDW: 13.5 % (ref 11.5–15.5)
WBC: 6.1 10*3/uL (ref 4.0–10.5)

## 2017-05-15 LAB — I-STAT TROPONIN, ED: TROPONIN I, POC: 0.08 ng/mL (ref 0.00–0.08)

## 2017-05-15 LAB — I-STAT VENOUS BLOOD GAS, ED
ACID-BASE EXCESS: 8 mmol/L — AB (ref 0.0–2.0)
BICARBONATE: 34.8 mmol/L — AB (ref 20.0–28.0)
O2 Saturation: 40 %
PH VEN: 7.4 (ref 7.250–7.430)
PO2 VEN: 24 mmHg — AB (ref 32.0–45.0)
TCO2: 36 mmol/L — ABNORMAL HIGH (ref 22–32)
pCO2, Ven: 56.2 mmHg (ref 44.0–60.0)

## 2017-05-15 LAB — INFLUENZA PANEL BY PCR (TYPE A & B)
INFLBPCR: NEGATIVE
Influenza A By PCR: POSITIVE — AB

## 2017-05-15 LAB — CBG MONITORING, ED
Glucose-Capillary: 497 mg/dL — ABNORMAL HIGH (ref 65–99)
Glucose-Capillary: 568 mg/dL (ref 65–99)
Glucose-Capillary: 481 mg/dL — ABNORMAL HIGH (ref 65–99)

## 2017-05-15 LAB — GLUCOSE, CAPILLARY: Glucose-Capillary: 329 mg/dL — ABNORMAL HIGH (ref 65–99)

## 2017-05-15 MED ORDER — ACETAMINOPHEN 500 MG PO TABS
1000.0000 mg | ORAL_TABLET | Freq: Once | ORAL | Status: AC
  Administered 2017-05-15: 1000 mg via ORAL
  Filled 2017-05-15: qty 2

## 2017-05-15 MED ORDER — INSULIN ASPART 100 UNIT/ML ~~LOC~~ SOLN
0.0000 [IU] | Freq: Every day | SUBCUTANEOUS | Status: DC
  Administered 2017-05-15: 4 [IU] via SUBCUTANEOUS
  Administered 2017-05-17: 2 [IU] via SUBCUTANEOUS

## 2017-05-15 MED ORDER — INSULIN ASPART 100 UNIT/ML ~~LOC~~ SOLN
0.0000 [IU] | Freq: Three times a day (TID) | SUBCUTANEOUS | Status: DC
  Administered 2017-05-16: 5 [IU] via SUBCUTANEOUS
  Administered 2017-05-16: 3 [IU] via SUBCUTANEOUS
  Administered 2017-05-16: 8 [IU] via SUBCUTANEOUS
  Administered 2017-05-17: 5 [IU] via SUBCUTANEOUS
  Administered 2017-05-17: 11 [IU] via SUBCUTANEOUS
  Administered 2017-05-17: 3 [IU] via SUBCUTANEOUS
  Administered 2017-05-18: 5 [IU] via SUBCUTANEOUS
  Filled 2017-05-15: qty 1

## 2017-05-15 MED ORDER — EMTRICITABINE-TENOFOVIR AF 200-25 MG PO TABS
1.0000 | ORAL_TABLET | Freq: Every day | ORAL | Status: DC
  Administered 2017-05-15 – 2017-05-18 (×4): 1 via ORAL
  Filled 2017-05-15 (×4): qty 1

## 2017-05-15 MED ORDER — ACETAMINOPHEN 325 MG PO TABS
650.0000 mg | ORAL_TABLET | Freq: Four times a day (QID) | ORAL | Status: DC | PRN
  Administered 2017-05-16 (×2): 650 mg via ORAL
  Filled 2017-05-15 (×2): qty 2

## 2017-05-15 MED ORDER — ATORVASTATIN CALCIUM 20 MG PO TABS
20.0000 mg | ORAL_TABLET | Freq: Every day | ORAL | Status: DC
  Administered 2017-05-15: 20 mg via ORAL
  Filled 2017-05-15: qty 2

## 2017-05-15 MED ORDER — DOLUTEGRAVIR SODIUM 50 MG PO TABS
50.0000 mg | ORAL_TABLET | Freq: Every day | ORAL | Status: DC
  Administered 2017-05-15 – 2017-05-18 (×4): 50 mg via ORAL
  Filled 2017-05-15 (×4): qty 1

## 2017-05-15 MED ORDER — HYDRALAZINE HCL 50 MG PO TABS
100.0000 mg | ORAL_TABLET | Freq: Two times a day (BID) | ORAL | Status: DC
  Administered 2017-05-16 – 2017-05-18 (×5): 100 mg via ORAL
  Filled 2017-05-15 (×5): qty 2

## 2017-05-15 MED ORDER — KETOROLAC TROMETHAMINE 30 MG/ML IJ SOLN
30.0000 mg | Freq: Four times a day (QID) | INTRAMUSCULAR | Status: DC | PRN
  Administered 2017-05-16: 30 mg via INTRAVENOUS
  Filled 2017-05-15: qty 1

## 2017-05-15 MED ORDER — CARVEDILOL 25 MG PO TABS
25.0000 mg | ORAL_TABLET | Freq: Two times a day (BID) | ORAL | Status: DC
  Administered 2017-05-16 – 2017-05-18 (×5): 25 mg via ORAL
  Filled 2017-05-15 (×4): qty 1

## 2017-05-15 MED ORDER — OSELTAMIVIR PHOSPHATE 75 MG PO CAPS
75.0000 mg | ORAL_CAPSULE | Freq: Two times a day (BID) | ORAL | Status: DC
  Administered 2017-05-15 – 2017-05-18 (×7): 75 mg via ORAL
  Filled 2017-05-15 (×8): qty 1

## 2017-05-15 MED ORDER — ISOSORBIDE MONONITRATE ER 60 MG PO TB24
60.0000 mg | ORAL_TABLET | Freq: Every day | ORAL | Status: DC
  Administered 2017-05-15 – 2017-05-18 (×4): 60 mg via ORAL
  Filled 2017-05-15 (×2): qty 1
  Filled 2017-05-15: qty 2
  Filled 2017-05-15: qty 1

## 2017-05-15 MED ORDER — ACETAMINOPHEN 650 MG RE SUPP: 650.0000 mg | Freq: Four times a day (QID) | RECTAL | Status: DC | PRN

## 2017-05-15 MED ORDER — CLOTRIMAZOLE 10 MG MT TROC
10.0000 mg | Freq: Every day | OROMUCOSAL | Status: DC
  Administered 2017-05-15 – 2017-05-18 (×13): 10 mg via ORAL
  Filled 2017-05-15 (×16): qty 1

## 2017-05-15 MED ORDER — CARVEDILOL 25 MG PO TABS
25.0000 mg | ORAL_TABLET | Freq: Two times a day (BID) | ORAL | Status: DC
  Administered 2017-05-15: 25 mg via ORAL
  Filled 2017-05-15: qty 2

## 2017-05-15 MED ORDER — FOLIC ACID 1 MG PO TABS
1.0000 mg | ORAL_TABLET | Freq: Every day | ORAL | Status: DC
  Administered 2017-05-15 – 2017-05-18 (×4): 1 mg via ORAL
  Filled 2017-05-15 (×4): qty 1

## 2017-05-15 MED ORDER — THIAMINE HCL 100 MG/ML IJ SOLN: 100.0000 mg | Freq: Every day | INTRAMUSCULAR | Status: DC

## 2017-05-15 MED ORDER — IPRATROPIUM-ALBUTEROL 0.5-2.5 (3) MG/3ML IN SOLN
3.0000 mL | Freq: Once | RESPIRATORY_TRACT | Status: AC
  Administered 2017-05-15: 3 mL via RESPIRATORY_TRACT
  Filled 2017-05-15: qty 3

## 2017-05-15 MED ORDER — ONDANSETRON HCL 4 MG/2ML IJ SOLN: 4.0000 mg | Freq: Four times a day (QID) | INTRAMUSCULAR | Status: DC | PRN

## 2017-05-15 MED ORDER — ENOXAPARIN SODIUM 40 MG/0.4ML ~~LOC~~ SOLN
40.0000 mg | SUBCUTANEOUS | Status: DC
  Administered 2017-05-15 – 2017-05-17 (×3): 40 mg via SUBCUTANEOUS
  Filled 2017-05-15 (×3): qty 0.4

## 2017-05-15 MED ORDER — VITAMIN B-1 100 MG PO TABS
100.0000 mg | ORAL_TABLET | Freq: Every day | ORAL | Status: DC
  Administered 2017-05-15 – 2017-05-18 (×4): 100 mg via ORAL
  Filled 2017-05-15 (×4): qty 1

## 2017-05-15 MED ORDER — FUROSEMIDE 10 MG/ML IJ SOLN
60.0000 mg | Freq: Once | INTRAMUSCULAR | Status: AC
  Administered 2017-05-15: 60 mg via INTRAVENOUS
  Filled 2017-05-15: qty 6

## 2017-05-15 MED ORDER — ADULT MULTIVITAMIN W/MINERALS CH
1.0000 | ORAL_TABLET | Freq: Every day | ORAL | Status: DC
  Administered 2017-05-15 – 2017-05-18 (×4): 1 via ORAL
  Filled 2017-05-15 (×4): qty 1

## 2017-05-15 MED ORDER — GUAIFENESIN ER 600 MG PO TB12
600.0000 mg | ORAL_TABLET | Freq: Two times a day (BID) | ORAL | Status: DC
  Administered 2017-05-15 – 2017-05-16 (×3): 600 mg via ORAL
  Filled 2017-05-15 (×3): qty 1

## 2017-05-15 MED ORDER — HYDRALAZINE HCL 50 MG PO TABS
100.0000 mg | ORAL_TABLET | Freq: Two times a day (BID) | ORAL | Status: DC
  Administered 2017-05-15: 100 mg via ORAL
  Filled 2017-05-15: qty 4

## 2017-05-15 MED ORDER — ATORVASTATIN CALCIUM 20 MG PO TABS
20.0000 mg | ORAL_TABLET | Freq: Every day | ORAL | Status: DC
  Administered 2017-05-16 – 2017-05-17 (×2): 20 mg via ORAL
  Filled 2017-05-15 (×2): qty 1

## 2017-05-15 MED ORDER — ONDANSETRON HCL 4 MG PO TABS: 4.0000 mg | ORAL_TABLET | Freq: Four times a day (QID) | ORAL | Status: DC | PRN

## 2017-05-15 MED ORDER — INSULIN ASPART 100 UNIT/ML ~~LOC~~ SOLN
17.0000 [IU] | Freq: Once | SUBCUTANEOUS | Status: AC
  Administered 2017-05-15: 17 [IU] via SUBCUTANEOUS

## 2017-05-15 NOTE — ED Notes (Signed)
Per IM resident, pt dispo for med surg bed. Will d/c cardiac monitoring per verbal order for cardiac monitoring if med surg floor cannot take pt on cardiac monitoring

## 2017-05-15 NOTE — ED Provider Notes (Signed)
Emergency Department Provider Note   I have reviewed the triage vital signs and the nursing notes.   HISTORY  Chief Complaint Shortness of Breath and Cough   HPI Shaun Brennan is a 42 y.o. male with PMH of CHF (EF 15-20%), DM, HIV, and HTN presents to the emergency department for evaluation of SOB and cough. He has had difficulty sleeping.  Symptoms are worse with exertion.  Denies chest pain.  He has had a minimally productive cough without hemoptysis.  He was recently admitted to the hospital with concern for PCP pneumonia and is currently completing antibiotic course.  Patient denies any chest pain.  Denies swelling in the legs above normal.  He is been compliant with his medications.  No modifying factors.   Past Medical History:  Diagnosis Date  . CHF (congestive heart failure) (HCC)   . Chronic systolic heart failure (HCC)    a. EF 15-20%, grade II DD, LA mild/mod dilated  . Diabetes mellitus without complication (HCC)   . DM2 (diabetes mellitus, type 2) (HCC)   . HIV (human immunodeficiency virus infection) (HCC)   . Hypertension     Patient Active Problem List   Diagnosis Date Noted  . Flu-like symptoms 05/15/2017  . AIDS (HCC)   . Alcohol abuse with alcohol-induced mood disorder (HCC) 01/06/2017  . Pneumonia 10/03/2016  . Onychomycosis 09/18/2016  . Diabetes mellitus due to underlying condition, uncontrolled, with hyperglycemia, without long-term current use of insulin (HCC) 06/16/2016  . Acute renal failure superimposed on chronic kidney disease, on chronic dialysis (HCC)   . Sepsis (HCC) 06/11/2016  . Acute respiratory failure with hypoxia (HCC) 06/11/2016  . Controlled type 2 diabetes mellitus with hyperglycemia (HCC) 06/11/2016  . Cough   . SOB (shortness of breath)   . Shortness of breath 01/17/2016  . Accelerated hypertension   . Hyperlipidemia 11/10/2015  . Diabetic neuropathy (HCC) 11/10/2015  . History of ETOH abuse 09/08/2015  . Suicidal ideation  07/14/2014  . Major depression 07/14/2014  . Depression, major, single episode, moderate (HCC) 07/11/2014  . Community acquired pneumonia 07/07/2014  . Community acquired pneumonia of right lower lobe of lung (HCC) 07/07/2014  . HTN (hypertension) 12/17/2013  . Chronic systolic heart failure (HCC) 12/17/2013  . Diabetes mellitus due to underlying condition without complications (HCC) 12/12/2013  . Smoking 12/12/2013  . Acute on chronic systolic heart failure (HCC) 12/09/2013  . Acute respiratory distress 12/06/2013  . 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 DM2 (diabetes mellitus, type 2) (HCC) 12/06/2013  . HIV (human immunodeficiency virus infection) (HCC) 12/06/2013  . H/O CHF 12/06/2013  . Cardiomegaly: per cxr 12/06/13 12/06/2013    Past Surgical History:  Procedure Laterality Date  . ANKLE SURGERY Right     Current Outpatient Rx  . Order #: [id number] Class: Print  . Order #: [id number] Class: Normal  . Order #: [id number] Class: Normal  . Order #: [id number] Class: Normal  . Order #: [id number] Class: Normal  . Order #: [id number] Class: Normal  . Order #: [id number] Class: Normal  . Order #: [id number] Class: Normal  . Order #: [id number] Class: Normal  . Order #: [id number] Class: Normal  . Order #: [id number] Class: Print  . Order #: [id number] Class: Normal  . Order #: [id number] Class: Normal  . Order #: [id number] Class: Print    Allergies Lisinopril  Family History  Problem Relation Age of Onset  . Other Father   . Diabetes Mother   . Stroke Paternal Grandfather     Social History Social History   Tobacco Use  .  Smoking status: Current Every Day Smoker    Packs/day: 0.25    Years: 15.00    Pack years: 3.75    Types: Cigarettes  . Smokeless tobacco: Never Used  . Tobacco comment: about 10 cigarettes a week  Substance Use Topics  . Alcohol use: Yes    Alcohol/week: 9.0 oz    Types: 12 Cans of beer, 3 Shots of liquor per week  . Drug use: Yes    Types: Marijuana     Review of Systems  Constitutional: Positive subjective fever/chills. Difficulty laying flat. Congestion.  Eyes: No visual changes. ENT: No sore throat. Cardiovascular: Denies chest pain. Respiratory: Positive shortness of breath and cough.  Gastrointestinal: No abdominal pain.  No nausea, no vomiting.  No diarrhea.  No constipation. Genitourinary: Negative for dysuria. Musculoskeletal: Negative for back pain. Skin: Negative for rash. Neurological: Negative for headaches, focal weakness or numbness.  10-point ROS otherwise negative.  ____________________________________________   PHYSICAL EXAM:  VITAL SIGNS: ED Triage Vitals  Enc Vitals Group     BP 05/15/17 0729 (!) 169/115     Pulse Rate 05/15/17 0729 (!) 103     Resp 05/15/17 0729 16     Temp 05/15/17 0729 100.3 F (37.9 C)     Temp Source 05/15/17 0729 Oral     SpO2 05/15/17 0729 98 %     Weight 05/15/17 0730 200 lb (90.7 kg)     Height 05/15/17 0730 5\' 5"  (1.651 m)   Constitutional: Alert and oriented. Appears to be in moderate respiratory distress.  Eyes: Conjunctivae are normal. Head: Atraumatic. Nose: No congestion/rhinnorhea. Mouth/Throat: Mucous membranes are moist.  Neck: No stridor. Cardiovascular: Tachycardia. Good peripheral circulation. Grossly normal heart sounds.   Respiratory: Increased respiratory effort.  No retractions. Lungs diminished at the bases with crackles. End-expiratory wheezing at the apices.  Gastrointestinal: Soft and nontender. No distention.  Musculoskeletal: No lower extremity tenderness. No gross deformities of extremities. Neurologic:  Normal speech and language. No gross focal neurologic deficits are appreciated.  Skin:  Skin is warm, dry and intact. No rash noted.  ____________________________________________   LABS (all labs ordered are listed, but only abnormal results are displayed)  Labs Reviewed  BASIC METABOLIC PANEL - Abnormal; Notable for the following  components:      Result Value   Sodium 129 (*)    Chloride 89 (*)    Glucose, Bld 357 (*)    Anion gap 16 (*)    All other components within normal limits  CBC - Abnormal; Notable for the following components:   MCH 34.5 (*)    Platelets 118 (*)    All other components within normal limits  HEPATIC FUNCTION PANEL - Abnormal; Notable for the following components:   Albumin 3.1 (*)    AST 56 (*)    All other components within normal limits  INFLUENZA PANEL BY PCR (TYPE A & B) - Abnormal; Notable for the following components:   Influenza A By PCR POSITIVE (*)    All other components within normal limits  BRAIN NATRIURETIC PEPTIDE - Abnormal; Notable for the following components:   B Natriuretic Peptide 1,107.7 (*)    All other components within normal limits  I-STAT CG4 LACTIC ACID, ED - Abnormal; Notable for the following components:   Lactic Acid, Venous 2.55 (*)    All other components within normal limits  I-STAT VENOUS BLOOD GAS, ED - Abnormal; Notable for the following components:   pO2, Ven 24.0 (*)  Bicarbonate 34.8 (*)    TCO2 36 (*)    Acid-Base Excess 8.0 (*)    All other components within normal limits  CULTURE, BLOOD (ROUTINE X 2)  CULTURE, BLOOD (ROUTINE X 2)  URINALYSIS, ROUTINE W REFLEX MICROSCOPIC  I-STAT TROPONIN, ED  I-STAT CG4 LACTIC ACID, ED   ____________________________________________  EKG   EKG Interpretation  Date/Time:  Tuesday May 15 2017 07:21:42 EST Ventricular Rate:  102 PR Interval:  150 QRS Duration: 154 QT Interval:  410 QTC Calculation: 534 R Axis:   -73 Text Interpretation:  Sinus tachycardia Biatrial enlargement Left axis deviation Non-specific intra-ventricular conduction block Abnormal ECG No STEMI. Similar to prior.  Confirmed by Alona Bene [id number]) on 05/15/2017 9:13:25 AM       ____________________________________________  RADIOLOGY  Dg Chest 2 View  Result Date: 05/15/2017 CLINICAL DATA:  Chest pain, fever, and  shortness of breath. EXAM: CHEST  2 VIEW COMPARISON:  Chest x-ray and CT chest dated March 12, 2017. FINDINGS: The cardiac silhouette remains at the upper limits of normal in size. Normal mediastinal contours. Normal pulmonary vascularity. Scattered calcified granulomas throughout both lungs are again noted. No focal consolidation, pleural effusion, or pneumothorax. No acute osseous abnormality. IMPRESSION: No active cardiopulmonary disease. Electronically Signed   By: Obie Dredge M.D.   On: 05/15/2017 08:03    ____________________________________________   PROCEDURES  Procedure(s) performed:   .Critical Care Performed by: Maia Plan, MD Authorized by: Maia Plan, MD   Critical care provider statement:    Critical care time (minutes):  45   Critical care time was exclusive of:  Separately billable procedures and treating other patients and teaching time   Critical care was necessary to treat or prevent imminent or life-threatening deterioration of the following conditions:  Respiratory failure   Critical care was time spent personally by me on the following activities:  Blood draw for specimens, development of treatment plan with patient or surrogate, discussions with consultants, evaluation of patient's response to treatment, examination of patient, ordering and performing treatments and interventions, ordering and review of laboratory studies, ordering and review of radiographic studies, pulse oximetry, re-evaluation of patient's condition and review of old charts   I assumed direction of critical care for this patient from another provider in my specialty: no       ____________________________________________   INITIAL IMPRESSION / ASSESSMENT AND PLAN / ED COURSE  Pertinent labs & imaging results that were available during my care of the patient were reviewed by me and considered in my medical decision making (see chart for details).  Patient presents to the emergency  department for evaluation of difficulty breathing with cough.  He is febrile here with some tachypnea.  Symptoms seem most consistent with a flulike illness.  Chest x-ray shows no pneumonia or significant pulmonary edema.  Plan for nebulizer therapy with some end expiratory wheezing on exam.  Low suspicion for ACS or PE.  Troponin negative.  Adding blood cultures, lactic acid, and Tylenol.  11:51 AM Patient is with increased WOB. Will start BiPAP. Appears somnolent but awakens to loud voice.   12:30 PM He is much improved on BiPAP. Calling for admission. Laxis given.   Discussed patient's case with medicine team to request admission. Patient and family (if present) updated with plan. Care transferred to medicine service.  I reviewed all nursing notes, vitals, pertinent old records, EKGs, labs, imaging (as available).  ____________________________________________  FINAL CLINICAL IMPRESSION(S) / ED DIAGNOSES  Final diagnoses:  Shortness of breath  Hypoxia     MEDICATIONS GIVEN DURING THIS VISIT:  Medications  insulin aspart (novoLOG) injection 0-15 Units (not administered)  insulin aspart (novoLOG) injection 0-5 Units (not administered)  thiamine (VITAMIN B-1) tablet 100 mg (not administered)    Or  thiamine (B-1) injection 100 mg (not administered)  folic acid (FOLVITE) tablet 1 mg (not administered)  multivitamin with minerals tablet 1 tablet (not administered)  oseltamivir (TAMIFLU) capsule 75 mg (not administered)  ipratropium-albuterol (DUONEB) 0.5-2.5 (3) MG/3ML nebulizer solution 3 mL (3 mLs Nebulization Given 05/15/17 0956)  acetaminophen (TYLENOL) tablet 1,000 mg (1,000 mg Oral Given 05/15/17 0956)  ipratropium-albuterol (DUONEB) 0.5-2.5 (3) MG/3ML nebulizer solution 3 mL (3 mLs Nebulization Given 05/15/17 1122)  furosemide (LASIX) injection 60 mg (60 mg Intravenous Given 05/15/17 1212)    Note:  This document was prepared using Dragon voice recognition software and may include  unintentional dictation errors.  Alona Bene, MD Emergency Medicine    Long, Arlyss Repress, MD 05/15/17 [phone]

## 2017-05-15 NOTE — ED Notes (Signed)
Patient denies pain and is resting comfortably.  

## 2017-05-15 NOTE — ED Notes (Signed)
Heart Healthy Diet Ordered for Dinner. Patient wanted [employer].

## 2017-05-15 NOTE — Progress Notes (Signed)
Patient placed on Bipap per MD order.  RN at bedside.

## 2017-05-15 NOTE — ED Notes (Signed)
CBG obtained using blood from pt IV when drawing blood. CBG finger stick repeated to verify. Admitting paged

## 2017-05-15 NOTE — ED Notes (Signed)
Admitting team at bedside.

## 2017-05-15 NOTE — ED Triage Notes (Addendum)
Pt states for one week he has been having congestion with cough and body aches, Last night stated having sob and trouble laying flat. Pt has labored breathing. Hx copd, HIV

## 2017-05-15 NOTE — H&P (Signed)
Date: 05/15/2017               Patient Name:  Shaun Brennan MRN: [id number]  DOB: [birth year] Age / Sex: 42 y.o., male   PCP: Patient, No Pcp Per         Medical Service: Internal Medicine Teaching Service         Attending Physician: Dr. Levert Feinstein, MD    First Contact: Dr. Anthonette Legato Pager: [phone]  Second Contact: Dr. Antony Contras Pager: [phone]       After Hours (After 5p/  First Contact Pager: [phone]  weekends / holidays): Second Contact Pager: [phone]   Chief Complaint: Shortness of breath   History of Present Illness: Shaun Brennan is a 42 yo M with a past medical history of HIV, DM2, HTN, HFrEF who presented to the ED with complaints of SOB and cough.   He reports a 3-4 day history of cough and shortness of breath, as well as generalized myalgias, subjective fever and diaphoresis, diarrhea, decrease appetite, and rhinorrhea. His SOB worsened overnight and he was unable to lay flat (uses two pillows at baseline, required more upright positioning), had increased cough. His cough his non-productive. He denies vomiting, states his appetite has improved. He has not noticed any LE edema or abdominal edema, no abdominal pain. He was admitted in December 2018 for suspected PCP pneumonia and discharged on a steroid taper and Bactrim. Though these courses were scheduled to end 3-4 weeks after discharge he reports he is still taking them. He reports taking all medications as prescribed but may occasionally miss doses.   In the ED, T 100.3, HR 103, BP 169/115, 98% on RA. Labs showed Na 129 (corrected for hyperglycemia 133-135), WBC 6.1, BNP 1,107. VBG pH 7.4. He was placed on BiPAP for increased work of breathing without desaturation. Blood cultures and influenza panel collected. He received a dose of IV Lasix 60 mg and was admitted for further management.   Meds:  Current Meds  Medication Sig  . aspirin 81 MG EC tablet Take 1 tablet (81 mg total) by mouth daily. (Patient taking  differently: Take 81 mg by mouth daily as needed for pain. )  . atorvastatin (LIPITOR) 20 MG tablet TAKE 1 TABLET BY MOUTH ONCE DAILY (Patient taking differently: TAKE 20MG  BY MOUTH ONCE DAILY)  . carvedilol (COREG) 25 MG tablet TAKE 1 TABLET BY MOUTH TWICE DAILY (Patient taking differently: TAKE 25MG  BY MOUTH TWICE DAILY WITH MEALS)  . DESCOVY 200-25 MG tablet TAKE 1 TABLET BY MOUTH EVERY DAY  . guaiFENesin-dextromethorphan (ROBITUSSIN DM) 100-10 MG/5ML syrup Take 5 mLs by mouth every 6 (six) hours as needed for cough.  . hydrALAZINE (APRESOLINE) 50 MG tablet TAKE 2 TABLETS BY MOUTH 2 TIMES A DAY (Patient taking differently: TAKE 100MG  BY MOUTH TWICE DAILY)  . isosorbide mononitrate (IMDUR) 60 MG 24 hr tablet TAKE 1 TABLET BY MOUTH ONCE DAILY (Patient taking differently: TAKE 60MG  BY MOUTH ONCE DAILY)  . Needle, Disp, 28G X 1/2" MISC 1 Dose by Does not apply route 2 (two) times daily.  . predniSONE (DELTASONE) 20 MG tablet Take 2 tablets daily for two days, then 1 tablet daily for fifteen days.  Marland Kitchen TIVICAY 50 MG tablet TAKE 1 TABLET BY MOUTH EVERY DAY (Patient taking differently: TAKE 50MG  BY MOUTH ONCE DAILY)     Allergies: Allergies as of 05/15/2017 - Review Complete 05/15/2017  Allergen Reaction Noted  . Lisinopril Cough 03/25/2014   Past Medical History:  Diagnosis Date  . CHF (congestive heart failure) (HCC)   . Chronic systolic heart failure (HCC)    a. EF 15-20%, grade II DD, LA mild/mod dilated  . Diabetes mellitus without complication (HCC)   . DM2 (diabetes mellitus, type 2) (HCC)   . HIV (human immunodeficiency virus infection) (HCC)   . Hypertension     Family History:  Family History  Problem Relation Age of Onset  . Other Father   . Diabetes Mother   . Stroke Paternal Grandfather      Social History:  Social History   Tobacco Use  . Smoking status: Current Every Day Smoker    Packs/day: 0.25    Years: 15.00    Pack years: 3.75    Types: Cigarettes  .  Smokeless tobacco: Never Used  . Tobacco comment: about 10 cigarettes a week  Substance Use Topics  . Alcohol use: Yes    Alcohol/week: 9.0 oz    Types: 12 Cans of beer, 3 Shots of liquor per week  . Drug use: Yes    Types: Marijuana   Alcohol: Around 2 40 oz beers per day, last drink yesterday   Review of Systems: A complete ROS was negative except as per HPI.   Physical Exam: Blood pressure (!) 183/112, pulse 96, temperature 98.9 F (37.2 C), temperature source Oral, resp. rate 16, height 5\' 5"  (1.651 m), weight 200 lb (90.7 kg), SpO2 100 %. General: Ill appearing, diaphoretic but no acute distress Head: Normocephalic, atraumatic Eyes: Normal conjuctiva, PERRL, EOMI ENT: Moist mucus membranes, no posterior pharyngeal exudate, questionable thrush over tongue CV: Slightly tachycardic, regular rhythm, no murmur appreciated Resp: Clear breath sounds bilaterally, no crackles, normal work of breathing, no distress  Abd: Soft, +BS, non-tender to palpation Extr: No LE edema, good cap refill  Neuro: Alert and oriented x3 Skin: Warm, dry      EKG: personally reviewed my interpretation is sinus tachycardia, bundle branch block with wide QRS, P waves consistent with atrial enlargement. No ischemic changes appreciated.   CXR: personally reviewed my interpretation is no effusions, consolidation, or edema appreciated.   Assessment & Plan by Problem:  Influenza A Pt presenting with 3-4 days of flu-like sx with influenza panel testing positive for Influenza A. Given his need for admission and co-morbidities, will treat with oseltamivir and supportive care. During our exam, he was saturating well on room air and his work of breathing had improved so BiPAP was discontinued.   --Monitor vital signs, fever curve --Oseltamivir 75 mg BID x 5 days --Tylenol, Toradol q6h prn for myalgias    H/o HFrEF Last Echo in Dec showed EF 30-35% with grade 2 diastolic dysfunction, he is on lasix 40 mg daily,  imdur, coreg, statin, and hydralazine as an outpatient. He does not appear volume overloaded on exam or CXR, though his BNP is elevated to 1,107 (compared to 350 on last admission) and he notes worsening SOB with laying down. He's received IV Lasix 60 mg in the ED, will monitor response and volume status.  --Cont Coreg 25 mg BID, atorvastatin 20 mg, hydral 100 bid, Imdur 60 --Reassess volume status for further Lasix doses  --Daily weights  H/o HIV Last CD4 150 and viral load undetectable in Dec '18, with recent admission for suspected PCP pneumonia. Reports adherence with HAART regimen of descovy and tivicay.  --Repeat CD4, viral load --Continue HAART regimen as inpatient   Oral Candidiasis On exam, oral thrush present in a pt at risk for  candidiasis with HIV, uncontrolled diabetes, and recent (or ongoing) steroid use. Will treat with clotrimazole troches five times daily x 14 days.   H/o DM Though 70/30 insulin listed on home medication list, the pt reports he has never used insulin in the past and is only on metformin for his diabetes. Last A1c 03/2017 was 10.8, bg 357 on admission. Likely does need insulin for adequate control.  --SSI achs     H/o Alcohol Use He reports drinking two "forties" per day with last drink being day prior to admission. Will monitor with CIWA, give thiamine and folic acid   Dispo: Admit patient to Observation with expected length of stay less than 2 midnights.  Signed: Ginger Carne, MD 05/15/2017, 2:55 PM  Pager: [phone]

## 2017-05-15 NOTE — ED Notes (Signed)
Called RT for bi-pap placement. Shaun Brennan is aware

## 2017-05-15 NOTE — ED Notes (Signed)
Attempted report x1. 

## 2017-05-15 NOTE — ED Notes (Addendum)
Admitting aware of pt CBG, per admitting no BMP to verify necessary, admitting to order one dose of 17 unit insulin

## 2017-05-15 NOTE — ED Notes (Signed)
Skin w/d. resp e/u. Pt states he feels much better

## 2017-05-15 NOTE — ED Notes (Signed)
Bi-pap placed. Pt is alert and states he is breathing better. Continues to be diaphoretic. Room cooled and personal fan ordered.

## 2017-05-15 NOTE — ED Notes (Signed)
Verified with admitting that med surg bed order still appropriate for pt due to CBG. Admitting confirmed that pt to go to med surg floor.

## 2017-05-15 NOTE — ED Notes (Signed)
Admitting paged second time

## 2017-05-15 NOTE — ED Notes (Signed)
Bipap removed. Pt placed on 2L [place] per admitting MD request. Pt alert and speaking with MD and visitor

## 2017-05-16 ENCOUNTER — Other Ambulatory Visit: Payer: Self-pay

## 2017-05-16 ENCOUNTER — Encounter (HOSPITAL_COMMUNITY): Payer: Self-pay | Admitting: General Practice

## 2017-05-16 DIAGNOSIS — Z8639 Personal history of other endocrine, nutritional and metabolic disease: Secondary | ICD-10-CM

## 2017-05-16 DIAGNOSIS — I5020 Unspecified systolic (congestive) heart failure: Secondary | ICD-10-CM

## 2017-05-16 DIAGNOSIS — D72819 Decreased white blood cell count, unspecified: Secondary | ICD-10-CM

## 2017-05-16 LAB — HIV-1 RNA QUANT-NO REFLEX-BLD: LOG10 HIV-1 RNA: UNDETERMINED {Log_copies}/mL

## 2017-05-16 LAB — BASIC METABOLIC PANEL
ANION GAP: 16 — AB (ref 5–15)
BUN: 16 mg/dL (ref 6–20)
CALCIUM: 8.8 mg/dL — AB (ref 8.9–10.3)
CO2: 26 mmol/L (ref 22–32)
CREATININE: 1.32 mg/dL — AB (ref 0.61–1.24)
Chloride: 88 mmol/L — ABNORMAL LOW (ref 101–111)
GFR calc Af Amer: >60 mL/min (ref >60)
GLUCOSE: 240 mg/dL — AB (ref 65–99)
Potassium: 4.5 mmol/L (ref 3.5–5.1)
Sodium: 130 mmol/L — ABNORMAL LOW (ref 135–145)

## 2017-05-16 LAB — CBC
HCT: 39.7 % (ref 39.0–52.0)
Hemoglobin: 13.6 g/dL (ref 13.0–17.0)
MCH: 34.3 pg — ABNORMAL HIGH (ref 26.0–34.0)
MCHC: 34.3 g/dL (ref 30.0–36.0)
MCV: 100.3 fL — ABNORMAL HIGH (ref 78.0–100.0)
PLATELETS: 121 10*3/uL — AB (ref 150–400)
RBC: 3.96 MIL/uL — ABNORMAL LOW (ref 4.22–5.81)
RDW: 13.7 % (ref 11.5–15.5)
WBC: 3.8 10*3/uL — ABNORMAL LOW (ref 4.0–10.5)

## 2017-05-16 LAB — T-HELPER CELLS (CD4) COUNT (NOT AT ARMC)
CD4 % Helper T Cell: 29 % — ABNORMAL LOW (ref 33–55)
CD4 T CELL ABS: 220 /uL — AB (ref 400–2700)

## 2017-05-16 LAB — GLUCOSE, CAPILLARY
GLUCOSE-CAPILLARY: 248 mg/dL — AB (ref 65–99)
GLUCOSE-CAPILLARY: 260 mg/dL — AB (ref 65–99)
GLUCOSE-CAPILLARY: 120 mg/dL — AB (ref 65–99)
Glucose-Capillary: 193 mg/dL — ABNORMAL HIGH (ref 65–99)

## 2017-05-16 MED ORDER — ACETAMINOPHEN 650 MG RE SUPP: 650.0000 mg | Freq: Four times a day (QID) | RECTAL | Status: DC | PRN

## 2017-05-16 MED ORDER — INSULIN STARTER KIT- SYRINGES (ENGLISH)
1.0000 | Freq: Once | Status: AC
  Administered 2017-05-16: 1
  Filled 2017-05-16: qty 1

## 2017-05-16 MED ORDER — ACETAMINOPHEN 500 MG PO TABS
1000.0000 mg | ORAL_TABLET | Freq: Four times a day (QID) | ORAL | Status: DC | PRN
  Administered 2017-05-17: 1000 mg via ORAL
  Filled 2017-05-16: qty 2

## 2017-05-16 MED ORDER — LIVING WELL WITH DIABETES BOOK
Freq: Once | Status: AC
  Administered 2017-05-16: 18:00:00
  Filled 2017-05-16: qty 1

## 2017-05-16 MED ORDER — INSULIN GLARGINE 100 UNIT/ML ~~LOC~~ SOLN
18.0000 [IU] | Freq: Every day | SUBCUTANEOUS | Status: DC
  Administered 2017-05-17: 18 [IU] via SUBCUTANEOUS
  Filled 2017-05-16 (×2): qty 0.18

## 2017-05-16 MED ORDER — PHENOL 1.4 % MT LIQD
1.0000 | OROMUCOSAL | Status: DC | PRN
  Administered 2017-05-16: 1 via OROMUCOSAL
  Filled 2017-05-16: qty 177

## 2017-05-16 MED ORDER — INSULIN ASPART 100 UNIT/ML ~~LOC~~ SOLN
5.0000 [IU] | Freq: Three times a day (TID) | SUBCUTANEOUS | Status: DC
  Administered 2017-05-16 – 2017-05-17 (×4): 5 [IU] via SUBCUTANEOUS

## 2017-05-16 MED ORDER — RAMELTEON 8 MG PO TABS
8.0000 mg | ORAL_TABLET | Freq: Once | ORAL | Status: DC
  Filled 2017-05-16: qty 1

## 2017-05-16 MED ORDER — GUAIFENESIN-CODEINE 100-10 MG/5ML PO SOLN
5.0000 mL | Freq: Four times a day (QID) | ORAL | Status: DC | PRN
  Administered 2017-05-16 – 2017-05-18 (×6): 5 mL via ORAL
  Filled 2017-05-16 (×6): qty 5

## 2017-05-16 MED ORDER — RAMELTEON 8 MG PO TABS
8.0000 mg | ORAL_TABLET | Freq: Every day | ORAL | Status: DC
  Administered 2017-05-16 – 2017-05-17 (×2): 8 mg via ORAL
  Filled 2017-05-16 (×2): qty 1

## 2017-05-16 NOTE — Progress Notes (Signed)
Dr. Frances Furbish with teaching service paged, informed patient slid from chair without pain or injury.  No orders received.

## 2017-05-16 NOTE — Progress Notes (Signed)
RN educated patient on medications to give tonight, pt. Refused insulin.

## 2017-05-16 NOTE — Progress Notes (Addendum)
Inpatient Diabetes Program Recommendations  AACE/ADA: New Consensus Statement on Inpatient Glycemic Control (2015)  Target Ranges:  Prepandial:   less than 140 mg/dL      Peak postprandial:   less than 180 mg/dL (1-2 hours)      Critically ill patients:  140 - 180 mg/dL   Lab Results  Component Value Date   GLUCAP 260 (H) 05/16/2017   HGBA1C 10.8 (H) 03/13/2017    Review of Glycemic Control Results for [name], [name] (MRN [id number]) as of 05/16/2017 13:00  Ref. Range 05/15/2017 17:39 05/15/2017 19:56 05/15/2017 21:26 05/16/2017 08:38 05/16/2017 11:43  Glucose-Capillary Latest Ref Range: 65 - 99 mg/dL 473 (H) 403 (H) 709 (H) 248 (H) 260 (H)   Diabetes history: DM2 Outpatient Diabetes medications: Metformin 1 gm bid Current orders for Inpatient glycemic control: Novolog correction moderate tid + hs  Inpatient Diabetes Program Recommendations:   Spoke with patient regarding what he has been taking between last admission and current admission. Patient states he took insulin yeats ago but has not taken any insulin since last admission. Patient states he feels comfortable with giving himself injections and willing to take if he needs to.Has been to [facility] in the past but did not receive insulin from clinic.  -Lantus 18 units qd -Novolog 5 units tid meal coverage if eats 50%  Nurses, please review insulin injection teaching with patient and allow patient to give his own injections.  Thank you, Shaun Brennan. Drakkar Medeiros, RN, MSN, CDE  Diabetes Coordinator Inpatient Glycemic Control Team Team Pager [phone] (8am-5pm) 05/16/2017 1:02 PM

## 2017-05-16 NOTE — Progress Notes (Signed)
   Subjective: Admitted yesterday with no acute events following. Pt reports difficulty sleeping due to cough and increased shortness of breath with laying flat. Cough remains non-productive.   Objective:  Vital signs in last 24 hours: Vitals:   05/16/17 0355 05/16/17 0500 05/16/17 0800 05/16/17 0935  BP: (!) 142/91  (!) 170/109 (!) 170/109  Pulse: 92  (!) 112   Resp: (!) 25  (!) 28   Temp: 97.6 F (36.4 C)  (!) 102.5 F (39.2 C)   TempSrc: Oral  Oral   SpO2: 97%  92%   Weight:  195 lb 5.2 oz (88.6 kg)    Height:  5\' 5"  (1.651 m)     General: Sitting up in bed, discomfort somewhat out of proportion to clinical status likely due to frustration/fatigue HEENT: Moist mucus membranes, continued tongue discoloration CV: RRR, no murmur appreciated  Resp: Clear breath sounds bilaterally, normal work of breathing, no distress  Extr: No LE edema  Neuro: Alert and oriented x3  Skin: Warm, dry      Assessment/Plan:  Influenza A Presented with classic influenza symptoms and subsequent flu panel positive for influenza A despite flu vaccination. Oseltamivir tx was initiated given his need for admission. He continues to have sx causing discomfort but remains stable. --Monitor vital signs, fever curve --Cont Oseltamivir  --Tylenol, DC Toradol  --Codeine-guaifenesin cough syrup   H/o HFrEF Last Echo 30-35% with grade 2 DD (03/2017). He continues to appear euvolemic on exam despite his elevated BNP on admission (1,107), weight not significantly elevated. Received a dose of IV lasix initially though his Cr has bumped slightly this morning after good urine output. Will hold further Lasix doses and continue to reassess, also hold toradol.  --Daily weights, I/O --Cont Coreg, statin, hydral, imdur. Hold lasix for now --BMP   Leukopenia Slight decrease in white count (3.8) and platelets (121) likely myelosuppression due to acute viral illness, will continue to monitor.  --CBC    H/o HIV Last  CD4 150 and viral load undetectable in Dec '18, with recent admission for suspected PCP pneumonia. Reports adherence with HAART regimen of descovy and tivicay.  --Repeat CD4, viral load pending  --Cont tivicay and descovy   Oral Candidiasis --Cont Clotrimazole troche (14 days total)    H/o DM 70/30 insulin listed on home medication list, but pt reports he has never used insulin in the past and is only on metformin for his diabetes. Last A1c 03/2017 was 10.8. Bg elevated overnight due to not receiving insulin with meal.  --Cont SSI achs    Dispo: Anticipated discharge in approximately 1-2 day(s).   Ginger Carne, MD 05/16/2017, 11:06 AM Pager: [phone]

## 2017-05-17 ENCOUNTER — Telehealth: Payer: Self-pay | Admitting: General Practice

## 2017-05-17 DIAGNOSIS — R0602 Shortness of breath: Secondary | ICD-10-CM

## 2017-05-17 DIAGNOSIS — R0902 Hypoxemia: Secondary | ICD-10-CM

## 2017-05-17 LAB — GLUCOSE, CAPILLARY
GLUCOSE-CAPILLARY: 228 mg/dL — AB (ref 65–99)
Glucose-Capillary: 191 mg/dL — ABNORMAL HIGH (ref 65–99)
Glucose-Capillary: 226 mg/dL — ABNORMAL HIGH (ref 65–99)
Glucose-Capillary: 303 mg/dL — ABNORMAL HIGH (ref 65–99)

## 2017-05-17 LAB — BASIC METABOLIC PANEL
Anion gap: 15 (ref 5–15)
BUN: 12 mg/dL (ref 6–20)
CHLORIDE: 89 mmol/L — AB (ref 101–111)
CO2: 26 mmol/L (ref 22–32)
CREATININE: 1.15 mg/dL (ref 0.61–1.24)
Calcium: 8.8 mg/dL — ABNORMAL LOW (ref 8.9–10.3)
GFR calc Af Amer: >60 mL/min (ref >60)
GFR calc non Af Amer: >60 mL/min (ref >60)
GLUCOSE: 214 mg/dL — AB (ref 65–99)
Potassium: 4.5 mmol/L (ref 3.5–5.1)
Sodium: 130 mmol/L — ABNORMAL LOW (ref 135–145)

## 2017-05-17 LAB — CBC
HCT: 40.8 % (ref 39.0–52.0)
Hemoglobin: 13.9 g/dL (ref 13.0–17.0)
MCH: 33.7 pg (ref 26.0–34.0)
MCHC: 34.1 g/dL (ref 30.0–36.0)
MCV: 99 fL (ref 78.0–100.0)
PLATELETS: 132 10*3/uL — AB (ref 150–400)
RBC: 4.12 MIL/uL — ABNORMAL LOW (ref 4.22–5.81)
RDW: 13.5 % (ref 11.5–15.5)
WBC: 6.2 10*3/uL (ref 4.0–10.5)

## 2017-05-17 MED ORDER — QUETIAPINE FUMARATE 50 MG PO TABS
25.0000 mg | ORAL_TABLET | Freq: Once | ORAL | Status: AC
  Administered 2017-05-17: 25 mg via ORAL
  Filled 2017-05-17: qty 1

## 2017-05-17 MED ORDER — BENZONATATE 100 MG PO CAPS
100.0000 mg | ORAL_CAPSULE | Freq: Three times a day (TID) | ORAL | Status: DC
  Administered 2017-05-17 – 2017-05-18 (×3): 100 mg via ORAL
  Filled 2017-05-17 (×3): qty 1

## 2017-05-17 MED ORDER — TRAZODONE HCL 50 MG PO TABS
50.0000 mg | ORAL_TABLET | Freq: Every day | ORAL | Status: DC
  Administered 2017-05-17: 50 mg via ORAL
  Filled 2017-05-17: qty 1

## 2017-05-17 NOTE — Social Work (Signed)
Pt partner Tinnie Gens requested food voucher for CSW. Pt partner did not arouse when CSW came into room, pt partner also had a guest tray come up from dietary while CSW was in room. CSW left voucher on chart for pt partner for dinner if he does not get a guest tray.   Doy Hutching, LCSWA [facility] Clinical Social Work [phone]

## 2017-05-17 NOTE — Progress Notes (Signed)
Family came out because pt. Very irritable, yelling and angry d/t lack of sleep. Family requesting call to MD because pt. Needs to sleep. RN spoke with MD, awaiting orders. MD stated would look at med interactions and add something possibly.

## 2017-05-17 NOTE — Progress Notes (Signed)
MD return call to RN , pt. Stated that he takes Seroquel at night for sleep unsure of the dosage. When asked the dosage pt. Stated he takes the "big pill" Pt. MD stated will verify and return call to RN. PT. having increased heart rate, and irritability. RN Will continue to monitor.

## 2017-05-17 NOTE — Progress Notes (Signed)
Subjective: Overnight, pt was agitated due to his difficulty sleeping. It was hoped cough syrup containing codeine, as well as ramelteon would aid his sleep, however this was ineffective. He reported taking seroquel at home for sleep and he was given a 25 mg dose. During am rounds, pt found asleep and was left to rest.   On re-evaluation this afternoon, pt reports feeling slightly better but persistent cough with slight relief with cough syrup, very concerned with cough and it's interference with his sleep. He reports he has been prescribed seroquel in the past by [facility], was last taking regularly about a month prior to admission. He also notes he was told he no longer needed insulin in the past (previously said he had never used).   Objective:  Vital signs in last 24 hours: Vitals:   05/17/17 0400 05/17/17 0431 05/17/17 0438 05/17/17 0500  BP:  (!) 180/104    Pulse: 93 96  96  Resp: (!) 26 (!) 25  (!) 24  Temp:  98.6 F (37 C)    TempSrc:  Oral    SpO2: 92% 93%  93%  Weight:   196 lb 6.9 oz (89.1 kg)   Height:       General: Sitting up in bed, no acute distress  HEENT: Moist mucus membranes CV: RRR, no murmur appreciated  Resp: Clear breath sounds bilaterally, normal work of breathing, no distress, intermittent cough   Extr: No LE edema  Neuro: Alert and oriented x3  Skin: Warm, dry     Assessment/Plan:  Influenza A Presented with classic influenza symptoms and subsequent flu panel positive for influenza A despite flu vaccination. Oseltamivir tx was initiated given his need for admission. He has improved objectively but remains concerned due to cough. Is stable for discharge and continued supportive care at home with follow up with PCP.  --Monitor vital signs, fever curve --Cont Oseltamivir  --Tylenol  --Codeine-guaifenesin cough syrup, add tessalon perles for better relief   Sleep Difficulty Pt having difficulty sleeping in the hospital leading to agitation. This is  likely a result of his cough and will hopefully improve with symptomatic relief of cough as his recovers from the flu. He reports being prescribed seroquel in the past by [facility], but records unavailable. Will attempt improved control of cough and an agent more commonly used as a sleep aid such as trazadone, instruct pt to follow up with [facility] as outpatient.   H/o HFrEF Last Echo 30-35% with grade 2 DD (03/2017). He continues to appear euvolemic on exam despite his elevated BNP on admission (1,107), weight not significantly elevated. Received a dose of IV lasix initially though his Cr bumped slightly before returning to baseline with cessation of further diuresis.  --Daily weights, I/O --Cont Coreg, statin, hydral, imdur. Hold lasix for now --BMP   Leukopenia, resolved  Slight decrease in white count (3.8) and platelets (121) likely myelosuppression due to acute viral illness, has improved to wnl at 6.2.  H/o HIV Last CD4 150 and viral load undetectable in Dec '18, with recent admission for suspected PCP pneumonia. Reports adherence with HAART regimen of descovy and tivicay. Repeat CD4 220, viral load undetectable. --Cont tivicay and descovy   Oral Candidiasis --Cont Clotrimazole troche (14 days total)    H/o DM 70/30 insulin listed on home medication list, but pt reports he has never used insulin in the past and is only on metformin for his diabetes. Last A1c 03/2017 was 10.8. Bg has improved with insulin administration  over course of admission, will discharge on prior regimen with slight increase.   Dispo: Anticipated discharge in approximately 0-1 day(s).   Ginger Carne, MD 05/17/2017, 7:21 AM Pager: [phone]

## 2017-05-17 NOTE — Telephone Encounter (Signed)
F/U hospital, possible est. Care per Dr.Harden; pt appt is 02/13 1015am/ [place]

## 2017-05-18 ENCOUNTER — Telehealth: Payer: Self-pay

## 2017-05-18 DIAGNOSIS — B20 Human immunodeficiency virus [HIV] disease: Secondary | ICD-10-CM

## 2017-05-18 DIAGNOSIS — Z79899 Other long term (current) drug therapy: Secondary | ICD-10-CM

## 2017-05-18 DIAGNOSIS — I5040 Unspecified combined systolic (congestive) and diastolic (congestive) heart failure: Secondary | ICD-10-CM

## 2017-05-18 DIAGNOSIS — G479 Sleep disorder, unspecified: Secondary | ICD-10-CM

## 2017-05-18 LAB — GLUCOSE, CAPILLARY: GLUCOSE-CAPILLARY: 231 mg/dL — AB (ref 65–99)

## 2017-05-18 MED ORDER — BENZONATATE 100 MG PO CAPS: 100.0000 mg | ORAL_CAPSULE | Freq: Three times a day (TID) | ORAL | 0 refills | Status: AC

## 2017-05-18 MED ORDER — CLOTRIMAZOLE 10 MG MT TROC: 10.0000 mg | Freq: Every day | OROMUCOSAL | 0 refills | Status: AC

## 2017-05-18 MED ORDER — "NEEDLE (DISP) 28G X 1/2"" MISC": 1.0000 | Freq: Two times a day (BID) | 0 refills | Status: AC

## 2017-05-18 MED ORDER — INSULIN NPH ISOPHANE & REGULAR (70-30) 100 UNIT/ML ~~LOC~~ SUSP: 15.0000 [IU] | Freq: Two times a day (BID) | SUBCUTANEOUS | 0 refills | Status: AC

## 2017-05-18 MED ORDER — TRAZODONE HCL 50 MG PO TABS: 50.0000 mg | ORAL_TABLET | Freq: Every day | ORAL | 0 refills | Status: AC

## 2017-05-18 MED ORDER — GUAIFENESIN-CODEINE 100-10 MG/5ML PO SOLN: 5.0000 mL | Freq: Four times a day (QID) | ORAL | 0 refills | Status: AC | PRN

## 2017-05-18 MED ORDER — OSELTAMIVIR PHOSPHATE 75 MG PO CAPS: 75.0000 mg | ORAL_CAPSULE | Freq: Two times a day (BID) | ORAL | 0 refills | Status: AC

## 2017-05-18 MED ORDER — PHENOL 1.4 % MT LIQD: 1.0000 | OROMUCOSAL | 0 refills | Status: AC | PRN

## 2017-05-18 MED FILL — CHERATUSSIN AC SYRUP: 100-10 | 6 days supply | Qty: 120 | Fill #0

## 2017-05-18 NOTE — Progress Notes (Signed)
Subjective: No acute events overnight, reports improved sleep. This morning pt transiently noted to be in junctional rhythm on tele, spontaneously resolved.   He was stable for discharge yesterday but due to financial barriers to picking up his medications he stayed overnight. He was advised that cost of tamiflu may outweigh the benefit and he could prioritize cough medications and insulin. Care management consult was placed to identify potential resources. He was adamant that he be discharged by 2 o'clock today as he has a prior commitment that he will not miss. He has a follow up appointment with the [facility] clinic for follow up.   Objective:  Vital signs in last 24 hours: Vitals:   05/17/17 2334 05/18/17 0447 05/18/17 0500 05/18/17 0600  BP: 112/76 (!) 158/95  (!) 157/85  Pulse: 80 77    Resp: 19 (!) 21    Temp: 97.8 F (36.6 C) 98.3 F (36.8 C)    TempSrc: Oral Oral    SpO2: 97% 94%    Weight:   193 lb 2 oz (87.6 kg)   Height:       General: Resting in bed comfortably, no acute distress  HEENT: Moist mucus membranes CV: RRR, no murmur appreciated  Resp: Clear breath sounds bilaterally, normal work of breathing, no distress, intermittent cough   Extr: No LE edema  Neuro: Alert and oriented x3  Skin: Warm, dry     Assessment/Plan:  Influenza A Presented with classic influenza symptoms and subsequent flu panel positive for influenza A despite flu vaccination. Oseltamivir tx was initiated given his need for admission. He has improved over the course of admission, still having intermittent cough and was counseled that his cough may persist even following resolution of his other sx. Attempting to establish resources for medications prior to discharge but will discharge prior to 2 o'clock per pt preference.  --Monitor vital signs, fever curve --Cont Oseltamivir  --Tylenol  --Codeine-guaifenesin cough syrup, tesssalon perles  Sleep Difficulty Pt having difficulty sleeping in the  hospital leading to agitation. This is likely a result of his cough. He reports being prescribed seroquel in the past by [facility], but records unavailable, instructed pt to follow up with [facility] as outpatient. Improved sleep with better cough control and trazodone.   H/o HFrEF Last Echo 30-35% with grade 2 DD (03/2017). He continues to appear euvolemic on exam despite his elevated BNP on admission (1,107), weight not significantly elevated. Received a dose of IV lasix initially though his Cr bumped slightly before returning to baseline with cessation of further diuresis. On chart review, he was previously on entresto and would benefit from re-initiation or Losartan alone. In the past, he was on an assistance program through the HF clinic.  --Daily weights, I/O --Cont Coreg, statin, hydral, imdur. Hold lasix for now --BMP   H/o HIV Last CD4 150 and viral load undetectable in Dec '18, with recent admission for suspected PCP pneumonia. Reports adherence with HAART regimen of descovy and tivicay. Repeat CD4 220, viral load undetectable. --Cont tivicay and descovy   Oral Candidiasis --Cont Clotrimazole troche (14 days total)    H/o DM 70/30 insulin listed on home medication list, but pt reports he has never used insulin in the past and is only on metformin for his diabetes. Last A1c 03/2017 was 10.8. Bg has improved with insulin administration over course of admission, will discharge on prior regimen with slight increase.   Dispo: Anticipated discharge in approximately 0-1 day(s).   Ginger Carne, MD 05/18/2017,  10:57 AM Pager: [phone]

## 2017-05-18 NOTE — Progress Notes (Signed)
0500- RN was notified pt went into junctional rhythm. Pt out of junctional rhythm within 5 minutes. RN notified on call MD. No new orders received. Will continue to monitor.

## 2017-05-18 NOTE — Telephone Encounter (Signed)
Lupita Leash with outpatient pharmacy want to know if pt Rx is in the IM program. Please call back.

## 2017-05-18 NOTE — Discharge Summary (Signed)
Name: Shaun Brennan MRN: [id number] DOB: [birth year] 42 y.o. PCP: Patient, No Pcp Per  Date of Admission: 05/15/2017  7:55 AM Date of Discharge: 05/18/2017  Attending Physician: Dr. Murriel Hopper  Discharge Diagnosis: Influenza A   Discharge Medications: Allergies as of 05/18/2017      Reactions   Lisinopril Cough      Medication List    STOP taking these medications   guaiFENesin-dextromethorphan 100-10 MG/5ML syrup Commonly known as:  ROBITUSSIN DM   predniSONE 20 MG tablet Commonly known as:  DELTASONE     TAKE these medications   aspirin 81 MG EC tablet Take 1 tablet (81 mg total) by mouth daily. What changed:    when to take this  reasons to take this   atorvastatin 20 MG tablet Commonly known as:  LIPITOR TAKE 1 TABLET BY MOUTH ONCE DAILY What changed:    how much to take  how to take this  when to take this   benzonatate 100 MG capsule Commonly known as:  TESSALON Take 1 capsule (100 mg total) by mouth 3 (three) times daily.   blood glucose meter kit and supplies Dispense based on patient and insurance preference. Use up to four times daily as directed. (FOR ICD-9 250.00, 250.01).   carvedilol 25 MG tablet Commonly known as:  COREG TAKE 1 TABLET BY MOUTH TWICE DAILY What changed:    how much to take  how to take this  when to take this   clotrimazole 10 MG troche Commonly known as:  MYCELEX Take 1 tablet (10 mg total) by mouth 5 (five) times daily for 13 days.   DESCOVY 200-25 MG tablet Generic drug:  emtricitabine-tenofovir AF TAKE 1 TABLET BY MOUTH EVERY DAY   furosemide 40 MG tablet Commonly known as:  LASIX Take 1 tablet (40 mg total) by mouth daily.   guaiFENesin-codeine 100-10 MG/5ML syrup Take 5 mLs by mouth every 6 (six) hours as needed for cough.   hydrALAZINE 50 MG tablet Commonly known as:  APRESOLINE TAKE 2 TABLETS BY MOUTH 2 TIMES A DAY What changed:  See the new instructions.   insulin NPH-regular Human (70-30)  100 UNIT/ML injection Commonly known as:  NOVOLIN 70/30 Inject 15 Units into the skin 2 (two) times daily with a meal. What changed:  how much to take   isosorbide mononitrate 60 MG 24 hr tablet Commonly known as:  IMDUR TAKE 1 TABLET BY MOUTH ONCE DAILY What changed:    how much to take  how to take this  when to take this   metFORMIN 500 MG tablet Commonly known as:  GLUCOPHAGE Take 2 tablets (1,000 mg total) by mouth 2 (two) times daily with a meal.   Needle (Disp) 28G X 1/2" Misc 1 Dose by Does not apply route 2 (two) times daily.   oseltamivir 75 MG capsule Commonly known as:  TAMIFLU Take 1 capsule (75 mg total) by mouth 2 (two) times daily for 3 days.   phenol 1.4 % Liqd Commonly known as:  CHLORASEPTIC Use as directed 1 spray in the mouth or throat as needed for throat irritation / pain.   TIVICAY 50 MG tablet Generic drug:  dolutegravir TAKE 1 TABLET BY MOUTH EVERY DAY What changed:    how much to take  how to take this  when to take this   traZODone 50 MG tablet Commonly known as:  DESYREL Take 1 tablet (50 mg total) by mouth at bedtime.  Disposition and follow-up:   [name] was discharged from [facility] in Stable condition.  At the hospital follow up visit please address:  1.  --Assess flu symptoms and cough  --Check for adherence to and ability to obtain insulin regimen. Was previously not on insulin despite medication list and high A1c --Consider adding Losartan or entresto back to regimen with HF. May have previously obtained entresto through HF clinic --Assess fluid status  --Encourage alcohol cessation  --Encourage medication adherence and follow up with [facility]   2.  Labs / imaging needed at time of follow-up: None  3.  Pending labs/ test needing follow-up: None  Follow-up Appointments:   Hospital Course by problem list:  Influenza A Presented with classic influenza sx including myalgias, cough,  nausea and was found to be positive for Influenza A despite flu vaccination in this season. Oseltamivir treatment was initiated given his need for admission. He improved over the course of the admission with a transient leukopenia that spontaneously resolved noted on labs due to myelosuppression with acute viral illness. His discharge was delayed due to difficulties obtaining medications on discharge. Case management provided him with a match letter for assistance. Follow up was attempted to be made with his prior PCP, however the pt had not been to an appointment in greater than one year and was no longer considered to be established. As an alternative, a follow up appointment was made in the internal medicine center clinic. On the day of actual discharge, pt was insistent he leave prior to 2 pm due to a prior commitment.   Sleep Difficulty While admitted, he experienced difficulty sleeping as a result of his persistent cough. He reported taking seroquel in the past which was prescribed by [facility], but records were unavailable and indication for this medicine is not clear from available documentation reviewed. His cough was controlled and trazodone administered with improvement. He was instructed to follow up with his prior psychiatry provider for further refills.    H/o combined systolic and diastolic HF Last echo showed EF 30-35% with grade 2 diastolic dysfunction (16/1096). He appeared euvolemic, weight was stable, and he did not have pulmonary edema or effusion on x-ray, however his BNP was elevated to 1,107 which was elevated compared to prior admission. He received a dose of IV lasix in the ED which led to a bump in his creatinine. Further diuretic doses were held while inpatient, and he was instructed to resume his oral daily lasix on discharge. His coreg, statin, hydralazine, and imdur were continued during hospitalization. On chart review, he was previously on entresto, however this has fallen off  his medication. He was previously on an assistance program for medications through the HF clinic and should follow up with them. Losartan alone can be considered for re-initiation on follow up until seeing HF.   H/o DM Last A1c 10.8 in Dec '18, bg elevated on admission. 70/30 insulin was on his medication list but he was not taking this. Reported that he was told it was no longer necessary at some point in the past. Based on his A1c and bg, this is likely incorrect and insulin was re-started as an inpatient and on discharge, a refill of 70/30 was prescribed with a slight increase in prior regimen to 15 U BID. Hopefully, his DM regimen can be titrated and adjusted with f/u.   Oral Candidiasis Pt with some thrush like plaque over tongue on initial exam with risk factors including prior steroid use (  from previous admission in Dec) and uncontrolled DM. Treatment started with clotrimazole troche five times daily for a total course of 14 days.    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 CIWA monitoring performed, no ativan required. Encourage cessation on follow up.   Discharge Vitals:   BP 136/89   Pulse 77   Temp 98.3 F (36.8 C) (Oral)   Resp (!) 21   Ht '5\' 5"'$  (1.651 m)   Wt 193 lb 2 oz (87.6 kg)   SpO2 94%   BMI 32.14 kg/m   Pertinent Labs, Studies, and Procedures:  CBC Latest Ref Rng & Units 05/17/2017 05/16/2017 05/15/2017  WBC 4.0 - 10.5 K/uL 6.2 3.8(L) 6.1  Hemoglobin 13.0 - 17.0 g/dL 13.9 13.6 15.1  Hematocrit 39.0 - 52.0 % 40.8 39.7 42.5  Platelets 150 - 400 K/uL 132(L) 121(L) 118(L)    Discharge Instructions: Discharge Instructions    Diet - low sodium heart healthy   Complete by:  As directed    Discharge instructions   Complete by:  As directed    Nice to meet you Mr. Rosana Berger -For your cough, we have prescribed both the cough syrup and the pills called tessalon to help control your cough. It will take some more time for the cough to fully  resolve. These were sent to the [place] and you have the letter from the case manager  -Continue taking the tamiflu medicine through 2/10.  -We also re-started the insulin you were previously on--take this twice a day with meals, 15 U each time.  -With Seroquel, you will need to follow up with [facility] to get more refills but we prescribed a small supply of a similar medicine called trazodone to help with sleep while you continue to get better -We made a follow up appointment in the [facility] (on the ground floor of the east wing of the hospital) for next Wednesday 2/13 at 10:15 am to see how you are doing and try to get you on the right long term medications to keep you as healthy as possible   Increase activity slowly   Complete by:  As directed       Signed: Tawny Asal, MD 05/18/2017, 1:58 PM   Pager: [phone]

## 2017-05-18 NOTE — Social Work (Addendum)
CSW has given pt partner Sullivan Lone a food voucher. CSW aware pt discharging today.  1:00pm- CSW also consulted to provide bus passes. CSW able to provide bus passes for pt and pt partner for discharge today.   CSW signing off. Please consult if any additional needs arise.  Doy Hutching, LCSWA [facility] Clinical Social Work [phone]

## 2017-05-20 LAB — CULTURE, BLOOD (ROUTINE X 2)
Culture: NO GROWTH
Culture: NO GROWTH
SPECIAL REQUESTS: ADEQUATE
Special Requests: ADEQUATE

## 2017-05-21 MED FILL — CLOTRIMAZOLE 10 MG TROCHE: 10 | 13 days supply | Qty: 65 | Fill #0

## 2017-05-21 MED FILL — OSELTAMIVIR PHOSPHATE 75 MG: 75 | 3 days supply | Qty: 6 | Fill #0

## 2017-05-21 MED FILL — traZODone HCL 50 MG TABS: 50 | 5 days supply | Qty: 5 | Fill #0

## 2017-05-21 MED FILL — BENZONATATE 100 MG CAPS: 100 | 7 days supply | Qty: 20 | Fill #0

## 2017-05-21 NOTE — Care Management Note (Signed)
Case Management Note  Patient Details  Name: Shaun Brennan MRN: [id number] Date of Birth: 10/21/1975  Subjective/Objective:   Pt admitted on 05/15/17 with Influenza A; pt with hx HIV. PTA, pt independent of ADLS; significant other at bedside.                   Action/Plan: Pt medically stable for discharge home today.  Pt is uninsured, but is eligible for medication assistance through [facility] program. [facility] letter given with explanation of program benefits.  Pt states he has no money for $3 copays; will override copays in system.  CSW consulted for transportation need.  Pt has follow up appointment in Internal Medicine Clinic for follow up.  He states he gets HIV meds at the [facility] ID clinic.     Expected Discharge Date: 05/18/2017           Expected Discharge Plan:  Home/Self Care  In-House Referral:  Clinical Social Work  Discharge planning Services  Medication Assistance, CM Consult, Indigent Health Clinic, [facility] Program  Post Acute Care Choice:    Choice offered to:     DME Arranged:    DME Agency:     HH Arranged:    HH Agency:     Status of Service:  Completed, signed off  If discussed at [employer], dates discussed:    Additional Comments:  Quintella Baton, RN, BSN  Trauma/Neuro ICU Case Manager [phone]

## 2017-05-23 ENCOUNTER — Encounter: Payer: Self-pay | Admitting: Internal Medicine

## 2017-05-23 ENCOUNTER — Ambulatory Visit (INDEPENDENT_AMBULATORY_CARE_PROVIDER_SITE_OTHER): Payer: Self-pay | Admitting: Internal Medicine

## 2017-05-23 ENCOUNTER — Other Ambulatory Visit: Payer: Self-pay

## 2017-05-23 DIAGNOSIS — E1169 Type 2 diabetes mellitus with other specified complication: Secondary | ICD-10-CM

## 2017-05-23 DIAGNOSIS — R6889 Other general symptoms and signs: Secondary | ICD-10-CM

## 2017-05-23 DIAGNOSIS — R05 Cough: Secondary | ICD-10-CM

## 2017-05-23 DIAGNOSIS — I5042 Chronic combined systolic (congestive) and diastolic (congestive) heart failure: Secondary | ICD-10-CM

## 2017-05-23 DIAGNOSIS — Z79899 Other long term (current) drug therapy: Secondary | ICD-10-CM

## 2017-05-23 DIAGNOSIS — Z8709 Personal history of other diseases of the respiratory system: Secondary | ICD-10-CM

## 2017-05-23 DIAGNOSIS — Z21 Asymptomatic human immunodeficiency virus [HIV] infection status: Secondary | ICD-10-CM

## 2017-05-23 DIAGNOSIS — I5022 Chronic systolic (congestive) heart failure: Secondary | ICD-10-CM

## 2017-05-23 DIAGNOSIS — E119 Type 2 diabetes mellitus without complications: Secondary | ICD-10-CM

## 2017-05-23 DIAGNOSIS — B20 Human immunodeficiency virus [HIV] disease: Secondary | ICD-10-CM

## 2017-05-23 NOTE — Progress Notes (Signed)
   CC: follow up after hospitalization for influenza   HPI:  [name] is a 42 y.o. with PMH as listed below who presents for follow up after hospitalization for . Please see the assessment and plans for the status of the patient chronic medical problems.    Past Medical History:  Diagnosis Date  . CHF (congestive heart failure) (HCC)   . Chronic systolic heart failure (HCC)    a. EF 15-20%, grade II DD, LA mild/mod dilated  . Diabetes mellitus without complication (HCC)   . DM2 (diabetes mellitus, type 2) (HCC)   . Flu 05/2017  . HIV (human immunodeficiency virus infection) (HCC)   . Hypertension    Review of Systems:  Refer to history of present illness and assessment and plans for pertinent review of systems, all others reviewed and negative  Physical Exam:  Vitals:   05/23/17 1115  BP: 117/76  Pulse: 97  Temp: (!) 97.5 F (36.4 C)  TempSrc: Oral  SpO2: 93%  Weight: 196 lb 8 oz (89.1 kg)  Height: 5\' 5"  (1.651 m)   General: well appearing, no acute distress  Cardiac: RRR, no murmur appreciated, no peripheral edema   Pulm: no respiratory distress, coughing during interview, he does not cough anything up, good inspiratory effort, no wheezing, bibasilar rhonchi improve with coughing  Abdomen: soft, non tender   Assessment & Plan:   Follow up after hospitalization for influenza  Hospitalized 2/5 - 2/8 for influenza A, he received a course of oseltamivir and improved gradually throughout the admission. Today he is her for follow up. He feels much better than the day of admission, his weakness has improved and he is left with a nagging cough. He was prescribed tessalon and guaifenesin- codeine during the hospital admission and a match letter. He request something stronger for the cough, insisting on a sleep medication because the cough is keeping him up at night. We discussed symptomatic mgmt.  - continue tessalon and guifenesin  - trial of sinus irrigation, warm tea  with honey   DM  At the time of admission he reported that he wasn't taking home insulin.  Glucose was elevated during the admission, his home dose of insulin was increased and he was provided a match letter at discharge.  He says that he has the insulin now but he has not decided to start using it yet.  -  Encouraged to use insulin 70/30 15 U BID   Chronic combined heart failure Despite appearing euvolemic on admission his BNP was elevated above 1000 he was given a dose of IV Lasix in the ED but this caused a increase in his creatinine so further diuresis was held.  Today he again appears euvolemic.  He was previously on an Insurance account manager for [facility] through the heart failure clinic and is due for follow-up.  -Continue Coreg, statin, hydralazine, and imdur -Encouraged to follow-up with heart failure  HIV During this admission viral load was undetectable, CD4 count 220.  He is being managed with Descovy and Tivicay.  - Follow up scheduled with ID 05/29/2017  - continue descovy and tivicay    See Encounters Tab for problem based charting.  Patient discussed with Dr. Heide Spark

## 2017-05-23 NOTE — Patient Instructions (Signed)
It was a pleasure to meet you today Mr. Shaun Brennan,   I am glad your feeling a little bit better, the cough and symptoms of the flu should continue to improve gradually. Try taking over the counter mucinex for your cough and using cough drops and warm drinks. To clean out your sinuses please follow these instructions.   You have a viral infection of your sinuses and upper respiratory tract. Unfortunately, we do not have any good antibiotics to treat this infection. However, it should resolve on its own in about 10 days.  In the meantime, there are several good remedies that will treat your symptoms and hopefully help to make you more comfortable. Please look for one of the over-the-counter medications below that will help with your congestion, cough, and post-nasal drip. Additionally, it will be very important to use nasal irrigation and nasal steroid spray to clear you sinus congestion. I have provided a recipe and instructions below.     BUFFERED ISOTONIC SALINE NASAL IRRIGATION  The Benefits:  1. When you irrigate, the isotonic saline (salt water) acts as a solvent and washes the mucus crusts and other debris from your nose.  2. This decongests and improves the airflow into your nose. The sinus passages begin to open.   Over the counter:  I recommend a Squirt Bottle form of saline to provide an adequate volume of irrigation to clear your sinuses.   Home Recipe:  1. Choose a 1-quart glass jar that is thoroughly cleansed.  2. Fill with sterile or distilled water, or you can boil water from the tap.  3. Add 1 to 2 heaping teaspoons of "pickling/canning/sea" salt (NOT table salt as it contains a large number of additives). This salt is available at the grocery store in the food canning section.  4. Mix ingredients together and store at room temperature. Discard after one week. If you find this solution too strong, you may decrease the amount of salt added to 1 to 1  teaspoons.  With children it is often best to start with a milder solution and advance slowly. Irrigate with 240 ml (8 oz) twice daily.  The Instructions:  You should plan to irrigate your nose with buffered isotonic saline 2 times per day. Many people prefer to warm the solution slightly in the microwave - but be sure that the solution is NOT HOT. Stand over the sink (some do this in the shower) and squirt the solution into each side of your nose, keeping your mouth open. This allows you to spit the saltwater out of your mouth. It will not harm you if you swallow a little.  If you have been told to use a nasal steroid such as Flonase, Nasonex, or Nasacort, you should always use isortonic saline solution first, then use your nasal steroid product. The nasal steroid is much more effective when sprayed onto clean nasal membranes and the steroid medicine will reach deeper into the nose.  Most people experience a little burning sensation the first few times they use a isotonic saline solution, but this usually goes away within a few days.  - please dont forget to schedule your appointments with heart failure and infectious disease   - please continue to take your insulin   - please schedule an appointment at [facility] wellness. Call [phone] to schedule.

## 2017-05-25 ENCOUNTER — Encounter: Payer: Self-pay | Admitting: Internal Medicine

## 2017-05-25 NOTE — Progress Notes (Signed)
Internal Medicine Clinic Attending  Case discussed with Dr. Blum at the time of the visit.  We reviewed the resident's history and exam and pertinent patient test results.  I agree with the assessment, diagnosis, and plan of care documented in the resident's note. 

## 2017-05-25 NOTE — Assessment & Plan Note (Signed)
Despite appearing euvolemic on admission his BNP was elevated above 1000 he was given a dose of IV Lasix in the ED but this caused a increase in his creatinine so further diuresis was held.  Today he again appears euvolemic.  He was previously on an Insurance account manager for [facility] through the heart failure clinic and is due for follow-up.  -Continue Coreg, statin, hydralazine, and imdur -Encouraged to follow-up with heart failure

## 2017-05-25 NOTE — Assessment & Plan Note (Signed)
Hospitalized 2/5 - 2/8 for influenza A, he received a course of oseltamivir and improved gradually throughout the admission. Today he is her for follow up. He feels much better than the day of admission, his weakness has improved and he is left with a nagging cough. He was prescribed tessalon and guaifenesin- codeine during the hospital admission and a match letter. He request something stronger for the cough, insisting on a sleep medication because the cough is keeping him up at night. We discussed symptomatic mgmt.  - continue tessalon and guifenesin  - trial of sinus irrigation, warm tea with honey

## 2017-05-25 NOTE — Assessment & Plan Note (Signed)
During this admission viral load was undetectable, CD4 count 220.  He is being managed with Descovy and Tivicay.  - Follow up scheduled with ID 05/29/2017  - continue descovy and tivicay

## 2017-05-25 NOTE — Assessment & Plan Note (Signed)
At the time of admission he reported that he wasn't taking home insulin.  Glucose was elevated during the admission, his home dose of insulin was increased and he was provided a match letter at discharge.  He says that he has the insulin now but he has not decided to start using it yet.  -  Encouraged to use insulin 70/30 15 U BID

## 2017-06-03 ENCOUNTER — Other Ambulatory Visit: Payer: Self-pay | Admitting: Infectious Diseases

## 2017-06-03 DIAGNOSIS — Z21 Asymptomatic human immunodeficiency virus [HIV] infection status: Secondary | ICD-10-CM

## 2017-06-03 DIAGNOSIS — B20 Human immunodeficiency virus [HIV] disease: Secondary | ICD-10-CM

## 2017-06-06 ENCOUNTER — Ambulatory Visit (INDEPENDENT_AMBULATORY_CARE_PROVIDER_SITE_OTHER): Payer: Self-pay | Admitting: Infectious Diseases

## 2017-06-06 ENCOUNTER — Encounter: Payer: Self-pay | Admitting: Infectious Diseases

## 2017-06-06 ENCOUNTER — Ambulatory Visit: Payer: Self-pay

## 2017-06-06 ENCOUNTER — Other Ambulatory Visit: Payer: Self-pay | Admitting: *Deleted

## 2017-06-06 VITALS — BP 138/87 | HR 90 | Temp 98.4°F | Wt 201.0 lb

## 2017-06-06 DIAGNOSIS — F17200 Nicotine dependence, unspecified, uncomplicated: Secondary | ICD-10-CM

## 2017-06-06 DIAGNOSIS — IMO0001 Reserved for inherently not codable concepts without codable children: Secondary | ICD-10-CM

## 2017-06-06 DIAGNOSIS — Z79899 Other long term (current) drug therapy: Secondary | ICD-10-CM

## 2017-06-06 DIAGNOSIS — Z113 Encounter for screening for infections with a predominantly sexual mode of transmission: Secondary | ICD-10-CM

## 2017-06-06 DIAGNOSIS — I5022 Chronic systolic (congestive) heart failure: Secondary | ICD-10-CM

## 2017-06-06 DIAGNOSIS — E0865 Diabetes mellitus due to underlying condition with hyperglycemia: Secondary | ICD-10-CM

## 2017-06-06 DIAGNOSIS — I5023 Acute on chronic systolic (congestive) heart failure: Secondary | ICD-10-CM

## 2017-06-06 DIAGNOSIS — B20 Human immunodeficiency virus [HIV] disease: Secondary | ICD-10-CM

## 2017-06-06 DIAGNOSIS — Z23 Encounter for immunization: Secondary | ICD-10-CM

## 2017-06-06 DIAGNOSIS — Z21 Asymptomatic human immunodeficiency virus [HIV] infection status: Secondary | ICD-10-CM

## 2017-06-06 MED ORDER — BICTEGRAVIR-EMTRICITAB-TENOFOV 50-200-25 MG PO TABS: 1.0000 | ORAL_TABLET | Freq: Every day | ORAL | 3 refills | Status: DC

## 2017-06-06 NOTE — Assessment & Plan Note (Signed)
Appears to be doing well, no sob or cough. No LE edema. Wt up 5# but has been variable over last several months.

## 2017-06-06 NOTE — Assessment & Plan Note (Signed)
Appears to be doing well, no sob or cough. No LE edema. Wt up 5# but has been variable over last several months. Appreciate IM f/u.

## 2017-06-06 NOTE — Addendum Note (Signed)
Addended by: Lorenso Courier on: 06/06/2017 11:15 AM   Modules accepted: Orders

## 2017-06-06 NOTE — Assessment & Plan Note (Signed)
Encouraged to quit. 

## 2017-06-06 NOTE — Assessment & Plan Note (Addendum)
Doing well Will change him to biktarvy for convenience Offered/refused condoms.  Has had the flu vax and the flu Needs hep A and B, will start today.  Will see him back in 3 months with med change.

## 2017-06-06 NOTE — Assessment & Plan Note (Addendum)
Does not currently have a meter. Doesn't know home Glc levels.  Will f/u with IM clinic.  Has not seen ophtho, will refer him.

## 2017-06-06 NOTE — Progress Notes (Signed)
   Subjective:    Patient ID: Shaun Brennan, male    DOB: 1976/01/28, 42 y.o.   MRN: [id number]  HPI 42 y.o. male with history of HIV diagnosed approximately 2007, diabetes mellitus, hypertension, history of CHF (30-35% NYHA II). He has had f/u at [facility] for anoscopy study.  DTGV/Desc is his only rx.  He was hospitalized 2-5 to 2-8 for influenza. He states he is now back to his baseline.  Renewing his adap today.   HIV 1 RNA Quant (copies/mL)  Date Value  05/15/2017 <20  03/12/2017 <20  05/11/2016 <20 DETECTED (A)   CD4 T Cell Abs (/uL)  Date Value  05/15/2017 220 (L)  03/12/2017 150 (L)  06/12/2016 410    Review of Systems  Constitutional: Negative for appetite change, chills, fever and unexpected weight change.  Respiratory: Negative for cough and shortness of breath.   Cardiovascular: Negative for leg swelling.  Gastrointestinal: Negative for constipation and diarrhea.  Genitourinary: Negative for difficulty urinating.  Psychiatric/Behavioral: Negative for sleep disturbance.  Please see HPI. All other systems reviewed and negative.     Objective:   Physical Exam  Constitutional: He appears well-developed and well-nourished.  HENT:  Mouth/Throat: No oropharyngeal exudate.  Eyes: EOM are normal. Pupils are equal, round, and reactive to light.  Neck: Neck supple.  Cardiovascular: Normal rate, regular rhythm and normal heart sounds.  Pulmonary/Chest: Effort normal and breath sounds normal.  Abdominal: Soft. Bowel sounds are normal. There is no tenderness. There is no rebound.  Musculoskeletal: He exhibits no edema.  Lymphadenopathy:    He has no cervical adenopathy.  Psychiatric: He has a normal mood and affect.       Assessment & Plan:

## 2017-06-11 NOTE — Telephone Encounter (Signed)
Pt seen as scheduled on 02/13 for HFU.  Will f/u with [facility] and [facility] for future visits.Shaun Spittle Cassady3/4/20194:31 PM

## 2017-07-17 ENCOUNTER — Other Ambulatory Visit: Payer: Self-pay | Admitting: *Deleted

## 2017-07-17 DIAGNOSIS — E785 Hyperlipidemia, unspecified: Secondary | ICD-10-CM

## 2017-07-19 ENCOUNTER — Other Ambulatory Visit (HOSPITAL_COMMUNITY): Payer: Self-pay | Admitting: *Deleted

## 2017-07-19 MED ORDER — HYDRALAZINE HCL 100 MG PO TABS: 100.0000 mg | ORAL_TABLET | Freq: Three times a day (TID) | ORAL | 0 refills | Status: DC

## 2017-07-19 MED ORDER — CARVEDILOL 25 MG PO TABS: 25.0000 mg | ORAL_TABLET | Freq: Two times a day (BID) | ORAL | 0 refills | Status: DC

## 2017-07-19 MED ORDER — ISOSORBIDE MONONITRATE ER 60 MG PO TB24: 60.0000 mg | ORAL_TABLET | Freq: Every day | ORAL | 0 refills | Status: DC

## 2017-07-19 MED ORDER — ATORVASTATIN CALCIUM 20 MG PO TABS: 20.0000 mg | ORAL_TABLET | Freq: Every day | ORAL | 0 refills | Status: DC

## 2017-07-24 MED FILL — hydrALAZINE HCL 100 MG TABS: 100 | 30 days supply | Qty: 90 | Fill #0

## 2017-07-24 MED FILL — CARVEDILOL 25 MG TABLET: 25 | 34 days supply | Qty: 68 | Fill #0

## 2017-07-24 MED FILL — ATORVASTATIN 20 MG TABLET: 20 | 34 days supply | Qty: 34 | Fill #0

## 2017-07-24 MED FILL — ISOSORBIDE MN ER 60 MG TAB: 60 | 34 days supply | Qty: 34 | Fill #0

## 2017-07-25 ENCOUNTER — Emergency Department (HOSPITAL_COMMUNITY)
Admission: EM | Admit: 2017-07-25 | Discharge: 2017-07-25 | Disposition: A | Discharge status: Home / Self Care | Payer: Self-pay | Attending: Emergency Medicine | Admitting: Emergency Medicine

## 2017-07-25 ENCOUNTER — Encounter (HOSPITAL_COMMUNITY): Payer: Self-pay | Admitting: Emergency Medicine

## 2017-07-25 DIAGNOSIS — E1140 Type 2 diabetes mellitus with diabetic neuropathy, unspecified: Secondary | ICD-10-CM | POA: Insufficient documentation

## 2017-07-25 DIAGNOSIS — Y999 Unspecified external cause status: Secondary | ICD-10-CM | POA: Insufficient documentation

## 2017-07-25 DIAGNOSIS — Z7982 Long term (current) use of aspirin: Secondary | ICD-10-CM | POA: Insufficient documentation

## 2017-07-25 DIAGNOSIS — Z23 Encounter for immunization: Secondary | ICD-10-CM | POA: Insufficient documentation

## 2017-07-25 DIAGNOSIS — Z79899 Other long term (current) drug therapy: Secondary | ICD-10-CM | POA: Insufficient documentation

## 2017-07-25 DIAGNOSIS — S01511A Laceration without foreign body of lip, initial encounter: Secondary | ICD-10-CM | POA: Insufficient documentation

## 2017-07-25 DIAGNOSIS — I110 Hypertensive heart disease with heart failure: Secondary | ICD-10-CM | POA: Insufficient documentation

## 2017-07-25 DIAGNOSIS — Y939 Activity, unspecified: Secondary | ICD-10-CM | POA: Insufficient documentation

## 2017-07-25 DIAGNOSIS — I5022 Chronic systolic (congestive) heart failure: Secondary | ICD-10-CM | POA: Insufficient documentation

## 2017-07-25 DIAGNOSIS — F17210 Nicotine dependence, cigarettes, uncomplicated: Secondary | ICD-10-CM | POA: Insufficient documentation

## 2017-07-25 DIAGNOSIS — Y929 Unspecified place or not applicable: Secondary | ICD-10-CM | POA: Insufficient documentation

## 2017-07-25 DIAGNOSIS — Z794 Long term (current) use of insulin: Secondary | ICD-10-CM | POA: Insufficient documentation

## 2017-07-25 MED ORDER — TETANUS-DIPHTH-ACELL PERTUSSIS 5-2.5-18.5 LF-MCG/0.5 IM SUSP
0.5000 mL | Freq: Once | INTRAMUSCULAR | Status: AC
  Administered 2017-07-25: 0.5 mL via INTRAMUSCULAR
  Filled 2017-07-25: qty 0.5

## 2017-07-25 MED ORDER — LIDOCAINE HCL (PF) 1 % IJ SOLN
5.0000 mL | Freq: Once | INTRAMUSCULAR | Status: AC
  Administered 2017-07-25: 5 mL
  Filled 2017-07-25: qty 5
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
## 2017-07-25 NOTE — Discharge Instructions (Signed)
As discussed, your sutures will need to come out in 5 days.  Monitor for any signs of infection and return if swelling, increased pain, purulence, redness fever chills or other new concerning symptoms.  Follow-up with your primary care provider and for suture removal.

## 2017-07-30 ENCOUNTER — Ambulatory Visit (HOSPITAL_COMMUNITY)
Admission: EM | Admit: 2017-07-30 | Discharge: 2017-07-30 | Disposition: A | Discharge status: Home / Self Care | Payer: Self-pay | Attending: Family Medicine | Admitting: Family Medicine

## 2017-07-30 DIAGNOSIS — S01511D Laceration without foreign body of lip, subsequent encounter: Secondary | ICD-10-CM

## 2017-07-30 NOTE — ED Provider Notes (Signed)
[facility]    CSN: [id number] Arrival date & time: 07/30/17  1030     History   Chief Complaint No chief complaint on file.   HPI Shaun Brennan is a 42 y.o. male.   Patient here for suture removal.  Had lip laceration repaired on 417.  HPI  Past Medical History:  Diagnosis Date  . CHF (congestive heart failure) ([place])   . Chronic systolic heart failure (HCC)    a. EF 15-20%, grade II DD, LA mild/mod dilated  . Diabetes mellitus without complication ([place])   . DM2 (diabetes mellitus, type 2) ([place])   . Flu 05/2017  . HIV (human immunodeficiency virus infection) ([place])   . Hypertension     Patient Active Problem List   Diagnosis Date Noted  . Flu-like symptoms 05/15/2017  . AIDS ([place])   . Alcohol abuse with alcohol-induced mood disorder ([place]) 01/06/2017  . Pneumonia 10/03/2016  . Onychomycosis 09/18/2016  . Diabetes mellitus due to underlying condition, uncontrolled, with hyperglycemia, without long-term current use of insulin ([place]) 06/16/2016  . Acute renal failure superimposed on chronic kidney disease, on chronic dialysis ([place])   . Sepsis ([place]) 06/11/2016  . Acute respiratory failure with hypoxia ([place]) 06/11/2016  . Controlled type 2 diabetes mellitus with hyperglycemia ([place]) 06/11/2016  . Cough   . SOB (shortness of breath)   . Shortness of breath 01/17/2016  . Accelerated hypertension   . Hyperlipidemia 11/10/2015  . Diabetic neuropathy ([place]) 11/10/2015  . History of ETOH abuse 09/08/2015  . Suicidal ideation 07/14/2014  . Major depression 07/14/2014  . Depression, major, single episode, moderate ([place]) 07/11/2014  . Community acquired pneumonia of right lower lobe of lung ([place]) 07/07/2014  . HTN (hypertension) 12/17/2013  . Chronic systolic heart failure ([place]) 12/17/2013  . Diabetes mellitus due to underlying condition without complications ([place]) 23/55/7322  . Smoking 12/12/2013  . Acute respiratory distress 12/06/2013  . Malignant hypertension  12/06/2013  . DM2 (diabetes mellitus, type 2) ([place]) 12/06/2013  . HIV (human immunodeficiency virus infection) ([place]) 12/06/2013  . H/O CHF 12/06/2013  . Cardiomegaly: per cxr 12/06/13 12/06/2013    Past Surgical History:  Procedure Laterality Date  . ANKLE SURGERY Right        Home Medications    Prior to Admission medications   Medication Sig Start Date End Date Taking? Authorizing Provider  aspirin 81 MG EC tablet Take 1 tablet (81 mg total) by mouth daily. Patient taking differently: Take 81 mg by mouth daily as needed for pain.  06/16/16   Dhungel, Nishant, MD  atorvastatin (LIPITOR) 20 MG tablet Take 1 tablet (20 mg total) by mouth daily. Please call for office visit [phone] 07/19/17   Bensimhon, Shaune Pascal, MD  benzonatate (TESSALON) 100 MG capsule Take 1 capsule (100 mg total) by mouth 3 (three) times daily. Patient not taking: Reported on 06/06/2017 05/18/17   Tawny Asal, MD  bictegravir-emtricitabine-tenofovir AF (BIKTARVY) 50-200-25 MG TABS tablet Take 1 tablet by mouth daily. 06/06/17   Campbell Riches, MD  blood glucose meter kit and supplies Dispense based on patient and insurance preference. Use up to four times daily as directed. (FOR ICD-9 250.00, 250.01). Patient not taking: Reported on 06/06/2017 03/20/17   Arrien, Jimmy Picket, MD  carvedilol (COREG) 25 MG tablet Take 1 tablet (25 mg total) by mouth 2 (two) times daily. Please call for office visit [phone] 07/19/17   Bensimhon, Shaune Pascal, MD  furosemide (LASIX) 40 MG tablet Take 1 tablet (40  mg total) by mouth daily. 03/20/17 04/19/17  Arrien, Jimmy Picket, MD  guaiFENesin-codeine 100-10 MG/5ML syrup Take 5 mLs by mouth every 6 (six) hours as needed for cough. 05/18/17   Tawny Asal, MD  hydrALAZINE (APRESOLINE) 100 MG tablet Take 1 tablet (100 mg total) by mouth 3 (three) times daily. Please call for office visit [phone] 07/19/17   Bensimhon, Shaune Pascal, MD  insulin NPH-regular Human (NOVOLIN 70/30)  (70-30) 100 UNIT/ML injection Inject 15 Units into the skin 2 (two) times daily with a meal. 05/18/17 06/17/17  Tawny Asal, MD  isosorbide mononitrate (IMDUR) 60 MG 24 hr tablet Take 1 tablet (60 mg total) by mouth daily. Please call for office visit [phone] 07/19/17   Bensimhon, Shaune Pascal, MD  metFORMIN (GLUCOPHAGE) 500 MG tablet Take 2 tablets (1,000 mg total) by mouth 2 (two) times daily with a meal. 03/20/17 04/19/17  Arrien, Jimmy Picket, MD  Needle, Disp, 28G X 1/2" MISC 1 Dose by Does not apply route 2 (two) times daily. Patient not taking: Reported on 06/06/2017 05/18/17   Tawny Asal, MD  phenol (CHLORASEPTIC) 1.4 % LIQD Use as directed 1 spray in the mouth or throat as needed for throat irritation / pain. Patient not taking: Reported on 06/06/2017 05/18/17   Tawny Asal, MD  traZODone (DESYREL) 50 MG tablet Take 1 tablet (50 mg total) by mouth at bedtime. 05/18/17   Tawny Asal, MD    Family History Family History  Problem Relation Age of Onset  . Other Father   . Diabetes Mother   . Stroke Paternal Grandfather     Social History Social History   Tobacco Use  . Smoking status: Current Every Day Smoker    Packs/day: 0.25    Years: 15.00    Pack years: 3.75    Types: Cigarettes  . Smokeless tobacco: Never Used  . Tobacco comment: about 4-5 a day   Substance Use Topics  . Alcohol use: Yes    Alcohol/week: 9.0 oz    Types: 12 Cans of beer, 3 Shots of liquor per week    Comment: evey other day  . Drug use: Yes    Types: Marijuana    Comment: every 2-3 days      Allergies   Lisinopril   Review of Systems Review of Systems   Physical Exam Triage Vital Signs ED Triage Vitals  Enc Vitals Group     BP      Pulse      Resp      Temp      Temp src      SpO2      Weight      Height      Head Circumference      Peak Flow      Pain Score      Pain Loc      Pain Edu?      Excl. in [place]?    No data found.  Updated Vital Signs There were no vitals taken  for this visit.  Visual Acuity Right Eye Distance:   Left Eye Distance:   Bilateral Distance:    Right Eye Near:   Left Eye Near:    Bilateral Near:     Physical Exam     UC Treatments / Results  Labs (all labs ordered are listed, but only abnormal results are displayed) Labs Reviewed - No data to display  EKG None Radiology No results found.  Procedures Procedures (including critical care time)  Medications Ordered in UC Medications - No data to display   Initial Impression / Assessment and Plan / UC Course  I have reviewed the triage vital signs and the nursing notes.  Pertinent labs & imaging results that were available during my care of the patient were reviewed by me and considered in my medical decision making (see chart for details).     Lip laceration.  Repaired 4-17 returns today for suture removal.  Edges are not well opposed.  I would give it 2 more days before removing sutures. See photo  Final Clinical Impressions(s) / UC Diagnoses   Final diagnoses:  None    ED Discharge Orders    None       Controlled Substance Prescriptions [place] Controlled Substance Registry consulted? No   Wardell Honour, MD 07/30/17 1143

## 2017-07-30 NOTE — ED Notes (Signed)
Per Dr. Hyacinth Meeker pt needs more time for suture. Wound not yet healed. He is coming back Wednesday,

## 2017-08-01 ENCOUNTER — Ambulatory Visit (HOSPITAL_COMMUNITY)
Admission: EM | Admit: 2017-08-01 | Discharge: 2017-08-01 | Disposition: A | Discharge status: Home / Self Care | Payer: Self-pay | Attending: Family Medicine | Admitting: Family Medicine

## 2017-08-01 ENCOUNTER — Encounter (HOSPITAL_COMMUNITY): Payer: Self-pay | Admitting: Emergency Medicine

## 2017-08-01 DIAGNOSIS — Z4802 Encounter for removal of sutures: Secondary | ICD-10-CM

## 2017-08-01 NOTE — ED Triage Notes (Signed)
Pt here for suture removal; 6 suture removed from lip; large scab noted instructed pt to come back if more sutures noted under scab once healed

## 2017-08-20 ENCOUNTER — Other Ambulatory Visit: Payer: Self-pay

## 2017-09-04 ENCOUNTER — Encounter: Payer: Self-pay | Admitting: Infectious Diseases

## 2017-09-25 ENCOUNTER — Other Ambulatory Visit: Payer: Self-pay

## 2017-09-25 DIAGNOSIS — Z113 Encounter for screening for infections with a predominantly sexual mode of transmission: Secondary | ICD-10-CM

## 2017-09-25 DIAGNOSIS — Z79899 Other long term (current) drug therapy: Secondary | ICD-10-CM

## 2017-09-25 DIAGNOSIS — B20 Human immunodeficiency virus [HIV] disease: Secondary | ICD-10-CM

## 2017-09-26 ENCOUNTER — Other Ambulatory Visit: Payer: Self-pay | Admitting: Internal Medicine

## 2017-09-26 DIAGNOSIS — E785 Hyperlipidemia, unspecified: Secondary | ICD-10-CM

## 2017-09-26 LAB — T-HELPER CELL (CD4) - (RCID CLINIC ONLY)
CD4 T CELL ABS: 410 /uL (ref 400–2700)
CD4 T CELL HELPER: 43 % (ref 33–55)

## 2017-09-27 LAB — HIV-1 RNA QUANT-NO REFLEX-BLD
HIV 1 RNA Quant: 79 copies/mL — ABNORMAL HIGH
HIV-1 RNA Quant, Log: 1.9 Log copies/mL — ABNORMAL HIGH

## 2017-09-27 MED FILL — ISOSORBIDE MN ER 60 MG TAB: 60 | 34 days supply | Qty: 34 | Fill #0

## 2017-09-27 MED FILL — hydrALAZINE HCL 100 MG TABS: 100 | 30 days supply | Qty: 90 | Fill #0

## 2017-09-27 MED FILL — ATORVASTATIN 20 MG TABLET: 20 | 34 days supply | Qty: 34 | Fill #0

## 2017-10-01 ENCOUNTER — Other Ambulatory Visit: Payer: Self-pay

## 2017-10-10 ENCOUNTER — Encounter: Payer: Self-pay | Admitting: Infectious Diseases

## 2017-10-29 ENCOUNTER — Ambulatory Visit: Payer: Self-pay | Admitting: Infectious Diseases

## 2017-11-01 ENCOUNTER — Telehealth (HOSPITAL_COMMUNITY): Payer: Self-pay | Admitting: Cardiology

## 2017-11-01 DIAGNOSIS — E785 Hyperlipidemia, unspecified: Secondary | ICD-10-CM

## 2017-11-01 MED ORDER — ISOSORBIDE MONONITRATE ER 60 MG PO TB24: 60.0000 mg | ORAL_TABLET | Freq: Every day | ORAL | 0 refills | Status: DC

## 2017-11-01 MED ORDER — CARVEDILOL 25 MG PO TABS: 25.0000 mg | ORAL_TABLET | Freq: Two times a day (BID) | ORAL | 0 refills | Status: DC

## 2017-11-01 MED ORDER — FUROSEMIDE 40 MG PO TABS: 40.0000 mg | ORAL_TABLET | Freq: Every day | ORAL | 0 refills | Status: DC

## 2017-11-01 MED ORDER — ASPIRIN 81 MG PO TBEC: 81.0000 mg | DELAYED_RELEASE_TABLET | Freq: Every day | ORAL | 3 refills | Status: AC

## 2017-11-01 MED ORDER — HYDRALAZINE HCL 100 MG PO TABS: 100.0000 mg | ORAL_TABLET | Freq: Three times a day (TID) | ORAL | 0 refills | Status: DC

## 2017-11-01 MED ORDER — ATORVASTATIN CALCIUM 20 MG PO TABS: ORAL_TABLET | ORAL | 0 refills | Status: DC

## 2017-11-01 MED FILL — ATORVASTATIN CALCIUM 20 MG: 20 | 34 days supply | Qty: 34 | Fill #0

## 2017-11-01 MED FILL — ASPIRIN ADULT LOW STRENGTH: 81 | 30 days supply | Qty: 30 | Fill #0

## 2017-11-01 MED FILL — FUROSEMIDE 40 MG TAB: 40 | 30 days supply | Qty: 30 | Fill #0

## 2017-11-01 MED FILL — ISOSORBIDE MN ER 60 MG TAB: 60 | 34 days supply | Qty: 34 | Fill #0

## 2017-11-01 MED FILL — CARVEDILOL 25 MG TABLET: 25 | 34 days supply | Qty: 68 | Fill #0
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
## 2017-11-16 ENCOUNTER — Other Ambulatory Visit: Payer: Self-pay | Admitting: Infectious Diseases

## 2017-11-16 DIAGNOSIS — B20 Human immunodeficiency virus [HIV] disease: Secondary | ICD-10-CM

## 2017-11-19 ENCOUNTER — Telehealth: Payer: Self-pay | Admitting: *Deleted

## 2017-11-19 NOTE — Telephone Encounter (Signed)
Attempted to call patient. Received refill request for Biktarvy. Patient is overdue for visit, needs to renew ADAP as well. His phone rang busy. RN authorized 30 day supply with note to pharmacy asking him to come in for visit/renewal. Andree Coss, RN

## 2017-12-06 ENCOUNTER — Ambulatory Visit: Payer: Self-pay

## 2017-12-07 ENCOUNTER — Encounter: Payer: Self-pay | Admitting: Infectious Diseases

## 2017-12-14 ENCOUNTER — Ambulatory Visit: Payer: Self-pay | Admitting: Infectious Diseases

## 2017-12-17 ENCOUNTER — Other Ambulatory Visit (HOSPITAL_COMMUNITY): Payer: Self-pay | Admitting: Internal Medicine

## 2017-12-17 ENCOUNTER — Telehealth (HOSPITAL_COMMUNITY): Payer: Self-pay

## 2017-12-17 DIAGNOSIS — E785 Hyperlipidemia, unspecified: Secondary | ICD-10-CM

## 2017-12-17 MED FILL — ASPIRIN ADULT LOW STRENGTH: 81 | 30 days supply | Qty: 30 | Fill #1

## 2017-12-17 MED FILL — hydrALAZINE HCL 100 MG TABS: 100 | 30 days supply | Qty: 90 | Fill #0

## 2017-12-17 NOTE — Telephone Encounter (Signed)
Pt needs to make an appt in the APP clinic before future med refills.

## 2017-12-18 ENCOUNTER — Encounter: Payer: Self-pay | Admitting: Infectious Diseases

## 2017-12-18 ENCOUNTER — Ambulatory Visit (HOSPITAL_COMMUNITY)
Admission: EM | Admit: 2017-12-18 | Discharge: 2017-12-18 | Disposition: A | Discharge status: Home / Self Care | Payer: Self-pay | Source: Ambulatory Visit | Attending: Family Medicine | Admitting: Family Medicine

## 2017-12-18 ENCOUNTER — Encounter (HOSPITAL_COMMUNITY): Payer: Self-pay | Admitting: Emergency Medicine

## 2017-12-18 ENCOUNTER — Ambulatory Visit (INDEPENDENT_AMBULATORY_CARE_PROVIDER_SITE_OTHER): Payer: Self-pay | Admitting: Infectious Diseases

## 2017-12-18 ENCOUNTER — Encounter (HOSPITAL_COMMUNITY): Payer: Self-pay

## 2017-12-18 ENCOUNTER — Other Ambulatory Visit (HOSPITAL_COMMUNITY): Payer: Self-pay | Admitting: *Deleted

## 2017-12-18 ENCOUNTER — Other Ambulatory Visit (HOSPITAL_COMMUNITY): Payer: Self-pay | Admitting: Internal Medicine

## 2017-12-18 ENCOUNTER — Other Ambulatory Visit: Payer: Self-pay

## 2017-12-18 ENCOUNTER — Ambulatory Visit (HOSPITAL_BASED_OUTPATIENT_CLINIC_OR_DEPARTMENT_OTHER)
Admission: RE | Admit: 2017-12-18 | Discharge: 2017-12-18 | Disposition: A | Discharge status: Home / Self Care | Payer: Self-pay | Source: Ambulatory Visit | Attending: Cardiology | Admitting: Cardiology

## 2017-12-18 ENCOUNTER — Other Ambulatory Visit (INDEPENDENT_AMBULATORY_CARE_PROVIDER_SITE_OTHER): Payer: Self-pay

## 2017-12-18 ENCOUNTER — Telehealth: Payer: Self-pay

## 2017-12-18 VITALS — BP 160/118 | HR 89 | Temp 97.9°F | Wt 191.0 lb

## 2017-12-18 VITALS — BP 156/118 | HR 97 | Wt 189.8 lb

## 2017-12-18 DIAGNOSIS — R0602 Shortness of breath: Secondary | ICD-10-CM

## 2017-12-18 DIAGNOSIS — E785 Hyperlipidemia, unspecified: Secondary | ICD-10-CM | POA: Insufficient documentation

## 2017-12-18 DIAGNOSIS — Z21 Asymptomatic human immunodeficiency virus [HIV] infection status: Secondary | ICD-10-CM

## 2017-12-18 DIAGNOSIS — F1011 Alcohol abuse, in remission: Secondary | ICD-10-CM

## 2017-12-18 DIAGNOSIS — Z794 Long term (current) use of insulin: Secondary | ICD-10-CM | POA: Insufficient documentation

## 2017-12-18 DIAGNOSIS — F17200 Nicotine dependence, unspecified, uncomplicated: Secondary | ICD-10-CM

## 2017-12-18 DIAGNOSIS — Z87898 Personal history of other specified conditions: Secondary | ICD-10-CM

## 2017-12-18 DIAGNOSIS — Z79899 Other long term (current) drug therapy: Secondary | ICD-10-CM

## 2017-12-18 DIAGNOSIS — N179 Acute kidney failure, unspecified: Secondary | ICD-10-CM | POA: Insufficient documentation

## 2017-12-18 DIAGNOSIS — L0291 Cutaneous abscess, unspecified: Secondary | ICD-10-CM

## 2017-12-18 DIAGNOSIS — F1014 Alcohol abuse with alcohol-induced mood disorder: Secondary | ICD-10-CM | POA: Insufficient documentation

## 2017-12-18 DIAGNOSIS — R05 Cough: Secondary | ICD-10-CM | POA: Insufficient documentation

## 2017-12-18 DIAGNOSIS — I5022 Chronic systolic (congestive) heart failure: Secondary | ICD-10-CM

## 2017-12-18 DIAGNOSIS — F329 Major depressive disorder, single episode, unspecified: Secondary | ICD-10-CM | POA: Insufficient documentation

## 2017-12-18 DIAGNOSIS — R51 Headache: Secondary | ICD-10-CM | POA: Insufficient documentation

## 2017-12-18 DIAGNOSIS — B20 Human immunodeficiency virus [HIV] disease: Secondary | ICD-10-CM | POA: Insufficient documentation

## 2017-12-18 DIAGNOSIS — E1122 Type 2 diabetes mellitus with diabetic chronic kidney disease: Secondary | ICD-10-CM | POA: Insufficient documentation

## 2017-12-18 DIAGNOSIS — L0202 Furuncle of face: Secondary | ICD-10-CM | POA: Insufficient documentation

## 2017-12-18 DIAGNOSIS — Z992 Dependence on renal dialysis: Secondary | ICD-10-CM | POA: Insufficient documentation

## 2017-12-18 DIAGNOSIS — I10 Essential (primary) hypertension: Secondary | ICD-10-CM

## 2017-12-18 DIAGNOSIS — Z7982 Long term (current) use of aspirin: Secondary | ICD-10-CM | POA: Insufficient documentation

## 2017-12-18 DIAGNOSIS — E1169 Type 2 diabetes mellitus with other specified complication: Secondary | ICD-10-CM

## 2017-12-18 DIAGNOSIS — E1159 Type 2 diabetes mellitus with other circulatory complications: Secondary | ICD-10-CM

## 2017-12-18 DIAGNOSIS — R0603 Acute respiratory distress: Secondary | ICD-10-CM | POA: Insufficient documentation

## 2017-12-18 DIAGNOSIS — F17210 Nicotine dependence, cigarettes, uncomplicated: Secondary | ICD-10-CM | POA: Insufficient documentation

## 2017-12-18 DIAGNOSIS — Z23 Encounter for immunization: Secondary | ICD-10-CM

## 2017-12-18 DIAGNOSIS — Z113 Encounter for screening for infections with a predominantly sexual mode of transmission: Secondary | ICD-10-CM

## 2017-12-18 DIAGNOSIS — R45851 Suicidal ideations: Secondary | ICD-10-CM | POA: Insufficient documentation

## 2017-12-18 DIAGNOSIS — L0201 Cutaneous abscess of face: Secondary | ICD-10-CM | POA: Insufficient documentation

## 2017-12-18 DIAGNOSIS — I130 Hypertensive heart and chronic kidney disease with heart failure and stage 1 through stage 4 chronic kidney disease, or unspecified chronic kidney disease: Secondary | ICD-10-CM | POA: Insufficient documentation

## 2017-12-18 MED ORDER — HYDRALAZINE HCL 100 MG PO TABS: 100.0000 mg | ORAL_TABLET | Freq: Three times a day (TID) | ORAL | 11 refills | Status: AC

## 2017-12-18 MED ORDER — CARVEDILOL 25 MG PO TABS: ORAL_TABLET | ORAL | 11 refills | Status: AC

## 2017-12-18 MED ORDER — METFORMIN HCL 500 MG PO TABS: 1000.0000 mg | ORAL_TABLET | Freq: Two times a day (BID) | ORAL | 0 refills | Status: AC

## 2017-12-18 MED ORDER — MUPIROCIN 2 % EX OINT: 1.0000 "application " | TOPICAL_OINTMENT | Freq: Two times a day (BID) | CUTANEOUS | 0 refills | Status: AC

## 2017-12-18 MED ORDER — ALBUTEROL SULFATE HFA 108 (90 BASE) MCG/ACT IN AERS: 2.0000 | INHALATION_SPRAY | Freq: Four times a day (QID) | RESPIRATORY_TRACT | 6 refills | Status: AC | PRN

## 2017-12-18 MED ORDER — DOXYCYCLINE HYCLATE 100 MG PO TABS: 100.0000 mg | ORAL_TABLET | Freq: Two times a day (BID) | ORAL | 0 refills | Status: AC

## 2017-12-18 MED ORDER — METFORMIN HCL 500 MG PO TABS: 1000.0000 mg | ORAL_TABLET | Freq: Two times a day (BID) | ORAL | 0 refills | Status: DC

## 2017-12-18 MED FILL — CARVEDILOL 25 MG TABLET: 25 | 30 days supply | Qty: 60 | Fill #0
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
## 2017-12-18 NOTE — Assessment & Plan Note (Signed)
He is off rx He is going to f/u with PCP, CV regarding this.

## 2017-12-18 NOTE — Patient Instructions (Signed)
RESTART Carvedilol (Coreg) 25 mg tablet twice daily.  RESTART Hydralazine 100 mg tablet three times daily (one tablet once every 8 hrs).  Will refer you to our Heart Failure Paramedicine Program. This program is designed to help support you at home with medication management, vital sign and weight measurements, education about heart failure, and any other needs to support you. A certified EMT will call you to set up your initial appointment in the home, and will be available to you at a weekly basis, free of charge.  Follow up 4 weeks.  _______________________________________________________________________ [place] Code: 1700  Take all medication as prescribed the day of your appointment. Bring all medications with you to your appointment.  Do the following things EVERYDAY: 1) Weigh yourself in the morning before breakfast. Write it down and keep it in a log. 2) Take your medicines as prescribed 3) Eat low salt foods-Limit salt (sodium) to 2000 mg per day.  4) Stay as active as you can everyday 5) Limit all fluids for the day to less than 2 liters

## 2017-12-18 NOTE — Progress Notes (Signed)
Patient ID: Shaun Brennan, male   DOB: 10-07-1975, 42 y.o.   MRN: [id number]   PCP: Dr. Annitta Needs [facility]) Primary Cardiologist: Dr. Haroldine Laws ID; Dr Johnnye Sima.   HPI: Mr. Shaun Brennan is a 42 yo male with a history of HIV, DM2, HTN, tobacco abuse and chronic systolic heart failure (due to presumed NICM EF 25-30% - no cath).   Today he returns for HF follow up. He has not been seen in the HF clinic in the last year. Complaining of right cheek pain. Says Overall feeling fine. Denies PND/Orthopnea. SOB with exertion.  Appetite ok. No fever or chills. He has not been weighing at home.  He has not had HF meds in the last 3  days. Continues to smoke 3-4 cigarettes per day. Lives with a friend.   ECHO (12/08/2013): EF 15-20%, grade II DD, mild/mod LA, RV normal ECHO 04/27/2014 EF 25-30% RV mildy HK. Not seen well.  ECHO 11/30/14 preliminary results 40-45% ECHO 01/19/2016: EF 40-45%. Grade IDD ECHO 03/2017: EF 30-35% Grade II DD  ROS: All systems negative except as listed in HPI, PMH and Problem List.  SH:  Social History   Socioeconomic History  . Marital status: Single    Spouse name: Not on file  . Number of children: Not on file  . Years of education: Not on file  . Highest education level: Not on file  Occupational History  . Not on file  Social Needs  . Financial resource strain: Not on file  . Food insecurity:    Worry: Not on file    Inability: Not on file  . Transportation needs:    Medical: Not on file    Non-medical: Not on file  Tobacco Use  . Smoking status: Current Every Day Smoker    Packs/day: 0.25    Years: 15.00    Pack years: 3.75    Types: Cigarettes  . Smokeless tobacco: Never Used  . Tobacco comment: about 4-5 a day   Substance and Sexual Activity  . Alcohol use: Yes    Alcohol/week: 15.0 standard drinks    Types: 12 Cans of beer, 3 Shots of liquor per week    Comment: evey other day  . Drug use: Yes    Types: Marijuana    Comment: every  2-3 days   . Sexual activity: Yes    Partners: Male    Birth control/protection: Condom    Comment: pt. declined condoms  Lifestyle  . Physical activity:    Days per week: Not on file    Minutes per session: Not on file  . Stress: Not on file  Relationships  . Social connections:    Talks on phone: Not on file    Gets together: Not on file    Attends religious service: Not on file    Active member of club or organization: Not on file    Attends meetings of clubs or organizations: Not on file    Relationship status: Not on file  . Intimate partner violence:    Fear of current or ex partner: Not on file    Emotionally abused: Not on file    Physically abused: Not on file    Forced sexual activity: Not on file  Other Topics Concern  . Not on file  Social History Narrative   Currently homeless    FH:  Family History  Problem Relation Age of Onset  . Other Father   . Diabetes Mother   .  Stroke Paternal Grandfather     Past Medical History:  Diagnosis Date  . CHF (congestive heart failure) ([place])   . Chronic systolic heart failure (HCC)    a. EF 15-20%, grade II DD, LA mild/mod dilated  . Diabetes mellitus without complication ([place])   . DM2 (diabetes mellitus, type 2) ([place])   . Flu 05/2017  . HIV (human immunodeficiency virus infection) ([place])   . Hypertension     Current Outpatient Medications  Medication Sig Dispense Refill  . atorvastatin (LIPITOR) 20 MG tablet TAKE 1 TABLET (20 MG TOTAL) BY MOUTH DAILY 30 tablet 1  . BIKTARVY 50-200-25 MG TABS tablet TAKE 1 TABLET BY MOUTH DAILY 30 tablet 0  . carvedilol (COREG) 25 MG tablet TAKE 1 TABLET (25 MG TOTAL) BY MOUTH 2 (TWO) TIMES DAILY 60 tablet 1  . furosemide (LASIX) 40 MG tablet TAKE 1 TABLET (40 MG TOTAL) BY MOUTH DAILY. 30 tablet 1  . hydrALAZINE (APRESOLINE) 100 MG tablet Take 1 tablet (100 mg total) by mouth 3 (three) times daily. 90 tablet 0  . insulin NPH-regular Human (NOVOLIN 70/30) (70-30) 100 UNIT/ML  injection Inject 15 Units into the skin 2 (two) times daily with a meal. 20 mL 0  . isosorbide mononitrate (IMDUR) 60 MG 24 hr tablet TAKE 1 TABLET (60 MG TOTAL) BY MOUTH DAILY. 30 tablet 1  . traZODone (DESYREL) 50 MG tablet Take 1 tablet (50 mg total) by mouth at bedtime. 5 tablet 0  . aspirin 81 MG EC tablet Take 1 tablet (81 mg total) by mouth daily. (Patient not taking: Reported on 12/18/2017) 30 tablet 3  . benzonatate (TESSALON) 100 MG capsule Take 1 capsule (100 mg total) by mouth 3 (three) times daily. (Patient not taking: Reported on 06/06/2017) 20 capsule 0  . blood glucose meter kit and supplies Dispense based on patient and insurance preference. Use up to four times daily as directed. (FOR ICD-9 250.00, 250.01). (Patient not taking: Reported on 06/06/2017) 1 each 0  . guaiFENesin-codeine 100-10 MG/5ML syrup Take 5 mLs by mouth every 6 (six) hours as needed for cough. (Patient not taking: Reported on 12/18/2017) 120 mL 0  . metFORMIN (GLUCOPHAGE) 500 MG tablet Take 2 tablets (1,000 mg total) by mouth 2 (two) times daily with a meal. (Patient not taking: Reported on 12/18/2017) 120 tablet 0  . Needle, Disp, 28G X 1/2" MISC 1 Dose by Does not apply route 2 (two) times daily. (Patient not taking: Reported on 06/06/2017) 60 each 0  . phenol (CHLORASEPTIC) 1.4 % LIQD Use as directed 1 spray in the mouth or throat as needed for throat irritation / pain. (Patient not taking: Reported on 06/06/2017) 1 Bottle 0   No current facility-administered medications for this encounter.     Vitals:   12/18/17 1056  BP: (!) 156/118  Pulse: 97  SpO2: 95%  Weight: 86.1 kg (189 lb 12.8 oz)   Wt Readings from Last 3 Encounters:  12/18/17 86.1 kg (189 lb 12.8 oz)  06/06/17 91.2 kg (201 lb)  05/23/17 89.1 kg (196 lb 8 oz)     PHYSICAL EXAM: General:  Well appearing. No resp difficulty HEENT: R cheek with indurated area. Erythema.  Neck: supple. JVP ~10 . Carotids 2+ bilat; no bruits. No lymphadenopathy or  thryomegaly appreciated. Cor: PMI nondisplaced. Regular rate & rhythm. No rubs, gallops or murmurs. Lungs: clear Abdomen: soft, nontender, nondistended. No hepatosplenomegaly. No bruits or masses. Good bowel sounds. Extremities: no cyanosis, clubbing, rash, edema Neuro: alert &  orientedx3, cranial nerves grossly intact. moves all 4 extremities w/o difficulty. Affect pleasant   ASSESSMENT & PLAN: 1) Chronic systolic HF: NICM?, ECHO 57/8469 EF 30-35%  NYHA IIIb. Volume status mildly elevated. Restart lasix 40 mg daily.  -Restart carvedilol, imdur, hydralazine.  No sure why he ran out of medications.  Would not use spironolactone due to compliance issues.  --2) HTN -Elevated but he has not had his HF medications.  Today I am going to refill HF meds.   3) HIV- Per ID.  4) Tobacco abuse:-  Discussed tobacco cessation.  5) Alcohol abuse: discussed alcohol cessation.   6) R Cheek - Induration. Has follow up with ID today. May need I&D   Today I am referring him back to HF Paramedicine.  Follow up in 4 weeks.    Saagar Tortorella, NP-C  11:12 AM

## 2017-12-18 NOTE — Progress Notes (Signed)
   Subjective:    Patient ID: Shaun Brennan, male    DOB: [birth year], 42 y.o.   MRN: [id number]  HPI 41y.o. male with history of HIV diagnosed approximately 2007, diabetes mellitus, hypertension, history of CHF (30-35% NYHA II). He has had f/u at [facility] for anoscopy study.  DTGV/Desc is his only rx.  Was seen for worsening SOB, esp when laying down at night.  FSG have been ? He has not been checking them as he lost his machine.  Has been off his rx's- DM, htn.    HIV 1 RNA Quant (copies/mL)  Date Value  09/25/2017 79 (H)  05/15/2017 <20  03/12/2017 <20   CD4 T Cell Abs (/uL)  Date Value  09/25/2017 410  05/15/2017 220 (L)  03/12/2017 150 (L)    Review of Systems  Constitutional: Negative for appetite change and unexpected weight change.  Respiratory: Positive for shortness of breath. Negative for cough.   Cardiovascular: Positive for chest pain.  Gastrointestinal: Negative for constipation and diarrhea.  Genitourinary: Negative for difficulty urinating.       Objective:   Physical Exam  Constitutional: He is oriented to person, place, and time. He appears well-developed and well-nourished.  HENT:  Mouth/Throat: No oropharyngeal exudate.    Eyes: Pupils are equal, round, and reactive to light. EOM are normal.  Neck: Normal range of motion. Neck supple.  Cardiovascular: Normal rate, regular rhythm and normal heart sounds.  Pulmonary/Chest: Effort normal and breath sounds normal.  Abdominal: Soft. Bowel sounds are normal. He exhibits no distension. There is no tenderness.  Musculoskeletal: He exhibits no edema.  Neurological: He is alert and oriented to person, place, and time.  Psychiatric: He has a normal mood and affect.          Assessment & Plan:

## 2017-12-18 NOTE — Assessment & Plan Note (Signed)
He is going to urgent care to have this lanced.  Will give him doxy rx as well (avoid bactrim with his prev renal issues, CHF, DM)

## 2017-12-18 NOTE — Assessment & Plan Note (Signed)
Will give him prn albuterol for pm episodes.  May need to try PPI Will defer further eval to his PCP.

## 2017-12-18 NOTE — Discharge Instructions (Addendum)
Start Doxycycline as directed by Dr Ninetta Lights. You can remove current dressing in 24 hours. Keep wound clean and dry. You can dress wound with bactroban. You can clean gently with soap and water. Do not soak area in water. Monitor for spreading redness, increased warmth, increased swelling, fever, follow up for reevaluation needed.

## 2017-12-18 NOTE — Assessment & Plan Note (Signed)
Appreciate IM f/u.  He is off rx but will get refilled today.

## 2017-12-18 NOTE — Assessment & Plan Note (Signed)
Will get him in with dentist.  Give him flu and PCV13 vax today.  Will continue his currect ART (biktarvy) Offered/refused condoms.  rtc in 6 months

## 2017-12-18 NOTE — Telephone Encounter (Signed)
Per Dr. Ninetta Lights gave patient one time refill on Metformin. Refill was sent to [facility] outpatient pharmacy with antiviral medication. Patient is aware of refill and understands PCP must refill medication.  Lorenso Courier, [place]

## 2017-12-18 NOTE — ED Notes (Signed)
Bed: UC01 Expected date:  Expected time:  Means of arrival:  Comments: 

## 2017-12-18 NOTE — Assessment & Plan Note (Signed)
Greatly appreciate CV team's expert f/u of this very complicated pt.

## 2017-12-19 ENCOUNTER — Other Ambulatory Visit (HOSPITAL_COMMUNITY): Payer: Self-pay

## 2017-12-19 ENCOUNTER — Telehealth (HOSPITAL_COMMUNITY): Payer: Self-pay

## 2017-12-19 DIAGNOSIS — E785 Hyperlipidemia, unspecified: Secondary | ICD-10-CM

## 2017-12-19 MED ORDER — FUROSEMIDE 40 MG PO TABS: 40.0000 mg | ORAL_TABLET | Freq: Every day | ORAL | 2 refills | Status: AC

## 2017-12-19 MED ORDER — ATORVASTATIN CALCIUM 20 MG PO TABS: ORAL_TABLET | ORAL | 2 refills | Status: AC

## 2017-12-19 MED FILL — FUROSEMIDE 40 MG TAB: 40 | 30 days supply | Qty: 30 | Fill #0

## 2017-12-19 MED FILL — ATORVASTATIN CALCIUM 20 MG: 20 | 30 days supply | Qty: 30 | Fill #0

## 2017-12-19 MED FILL — ISOSORBIDE MN ER 60 MG TAB: 60 | 30 days supply | Qty: 30 | Fill #0

## 2017-12-20 ENCOUNTER — Telehealth (HOSPITAL_COMMUNITY): Payer: Self-pay

## 2017-12-20 LAB — AEROBIC CULTURE  (SUPERFICIAL SPECIMEN)

## 2017-12-20 LAB — AEROBIC CULTURE W GRAM STAIN (SUPERFICIAL SPECIMEN)

## 2017-12-20 MED ORDER — AMOXICILLIN-POT CLAVULANATE 500-125 MG PO TABS: 1.0000 | ORAL_TABLET | Freq: Two times a day (BID) | ORAL | 0 refills | Status: DC

## 2017-12-20 NOTE — Telephone Encounter (Signed)
Culture positive for Group A Strep.  Rx for Augmentin BID x 7 days sent to pharmacy of record per [facility] PA. Attempted to reach patient. No answer at this time.

## 2017-12-20 NOTE — Telephone Encounter (Signed)
Culture positive for Group A Strep.  Rx for Augmentin BID x 7 days sent to pharmacy of record per Hallie PA. Attempted to reach patient. No answer at this time. 

## 2017-12-24 ENCOUNTER — Telehealth (HOSPITAL_COMMUNITY): Payer: Self-pay

## 2017-12-24 ENCOUNTER — Encounter: Payer: Self-pay | Admitting: Infectious Diseases

## 2017-12-24 NOTE — Telephone Encounter (Signed)
No answer x2 

## 2017-12-25 ENCOUNTER — Telehealth (HOSPITAL_COMMUNITY): Payer: Self-pay

## 2017-12-25 MED ORDER — AMOXICILLIN-POT CLAVULANATE 500-125 MG PO TABS: 1.0000 | ORAL_TABLET | Freq: Two times a day (BID) | ORAL | 0 refills | Status: AC

## 2017-12-25 NOTE — Telephone Encounter (Signed)
Attempted to reach patient x 3. No answer letter sent.

## 2017-12-25 NOTE — Telephone Encounter (Signed)
Pt called and aware of test results and new rx.

## 2017-12-31 NOTE — Telephone Encounter (Signed)
error 

## 2018-01-08 LAB — COMPLETE METABOLIC PANEL WITH GFR
AG Ratio: 1.1 (calc) (ref 1.0–2.5)
ALKALINE PHOSPHATASE (APISO): 100 U/L (ref 40–115)
ALT: 42 U/L (ref 9–46)
AST: 34 U/L (ref 10–40)
Albumin: 3.4 g/dL — ABNORMAL LOW (ref 3.6–5.1)
BILIRUBIN TOTAL: 0.5 mg/dL (ref 0.2–1.2)
BUN: 20 mg/dL (ref 7–25)
CHLORIDE: 98 mmol/L (ref 98–110)
CO2: 26 mmol/L (ref 20–32)
CREATININE: 1.14 mg/dL (ref 0.60–1.35)
Calcium: 8.8 mg/dL (ref 8.6–10.3)
GFR, Est African American: 93 mL/min/{1.73_m2} (ref >=60)
GFR, Est Non African American: 80 mL/min/{1.73_m2} (ref >=60)
GLUCOSE: 283 mg/dL — AB (ref 65–99)
Globulin: 3.1 g/dL (calc) (ref 1.9–3.7)
Potassium: 4.5 mmol/L (ref 3.5–5.3)
Sodium: 134 mmol/L — ABNORMAL LOW (ref 135–146)
TOTAL PROTEIN: 6.5 g/dL (ref 6.1–8.1)

## 2018-01-08 LAB — LIPID PANEL
Cholesterol: 206 mg/dL — ABNORMAL HIGH (ref <200)
HDL: 62 mg/dL (ref >40)
LDL CHOLESTEROL (CALC): 125 mg/dL — AB
NON-HDL CHOLESTEROL (CALC): 144 mg/dL — AB (ref <130)
Total CHOL/HDL Ratio: 3.3 (calc) (ref <5.0)
Triglycerides: 93 mg/dL (ref <150)

## 2018-01-08 LAB — CBC
HEMATOCRIT: 37.8 % — AB (ref 38.5–50.0)
Hemoglobin: 13.1 g/dL — ABNORMAL LOW (ref 13.2–17.1)
MCH: 33.2 pg — ABNORMAL HIGH (ref 27.0–33.0)
MCHC: 34.7 g/dL (ref 32.0–36.0)
MCV: 95.7 fL (ref 80.0–100.0)
MPV: 11.3 fL (ref 7.5–12.5)
Platelets: 143 10*3/uL (ref 140–400)
RBC: 3.95 10*6/uL — AB (ref 4.20–5.80)
RDW: 13.5 % (ref 11.0–15.0)
WBC: 3.8 10*3/uL (ref 3.8–10.8)

## 2018-01-08 LAB — RPR: RPR: NONREACTIVE

## 2018-01-13 ENCOUNTER — Other Ambulatory Visit: Payer: Self-pay | Admitting: Infectious Diseases

## 2018-01-13 DIAGNOSIS — B20 Human immunodeficiency virus [HIV] disease: Secondary | ICD-10-CM

## 2018-01-15 ENCOUNTER — Encounter (HOSPITAL_COMMUNITY): Payer: Self-pay

## 2018-02-08 DEATH — deceased

## 2018-06-02 IMAGING — DX DG CHEST 1V PORT
1 series · 1 of 1 positions shown · non-contrast
Comparison: June 14, 2015

CLINICAL DATA: Shortness of breath and cough for 10 days

EXAM:
PORTABLE CHEST 1 VIEW

[chest ap]
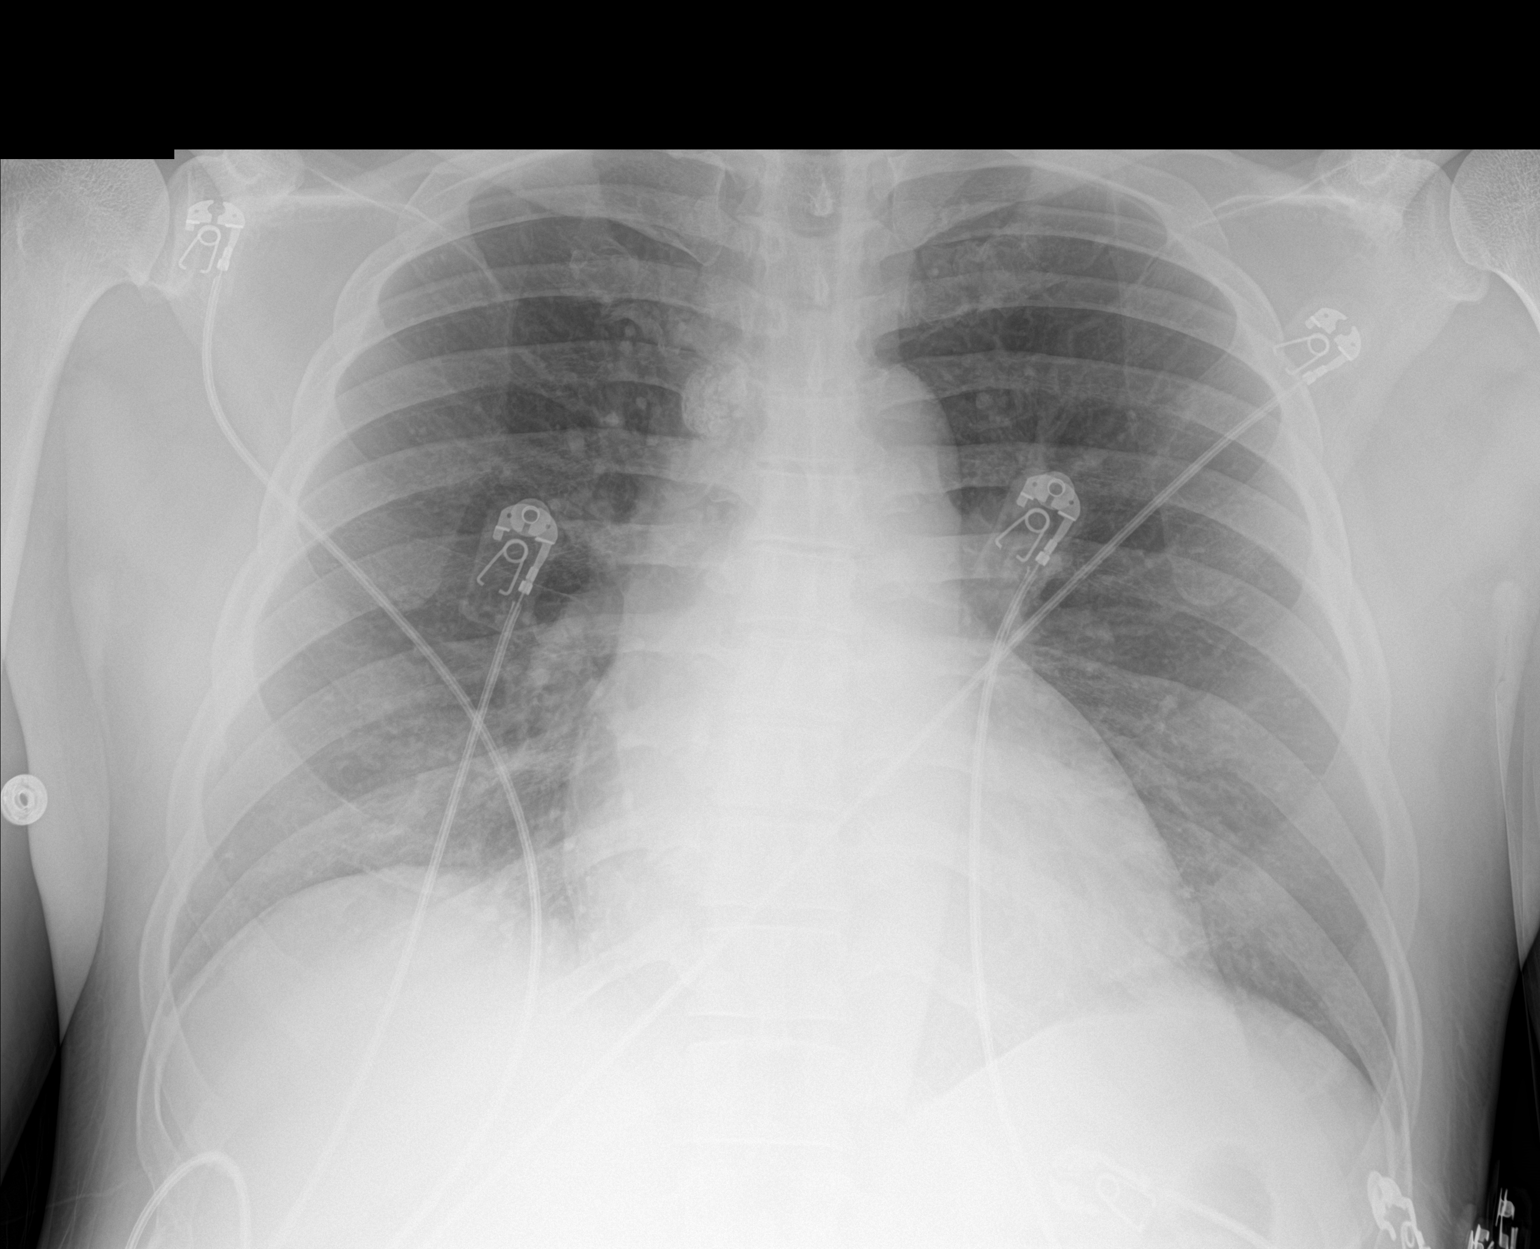

[1 of 1 positions shown; findings below may reference images not displayed]

FINDINGS: There is no edema or consolidation. There are scattered tiny
granulomas throughout the lungs bilaterally. The heart size and
pulmonary vascularity are normal. No adenopathy. There is a
calcified azygos lymph node. No bone lesions.
IMPRESSION: Evidence prior granulomatous disease. No edema or consolidation.
Stable cardiac silhouette.

## 2018-06-02 IMAGING — DX DG CHEST 2V
2 series · 2 of 2 positions shown · non-contrast
Comparison: Portable chest x-ray January 17, 2016 and PA and
lateral chest x-ray June 14, 2015.

CLINICAL DATA: Ten days of productive cough and shortness of
breath. Coughing worse when lying down. Patient also reports
fatigue. Current smoker, history of diabetes and hypertension. Also
history of HIV, CHF

EXAM:
CHEST  2 VIEW

[chest pa]
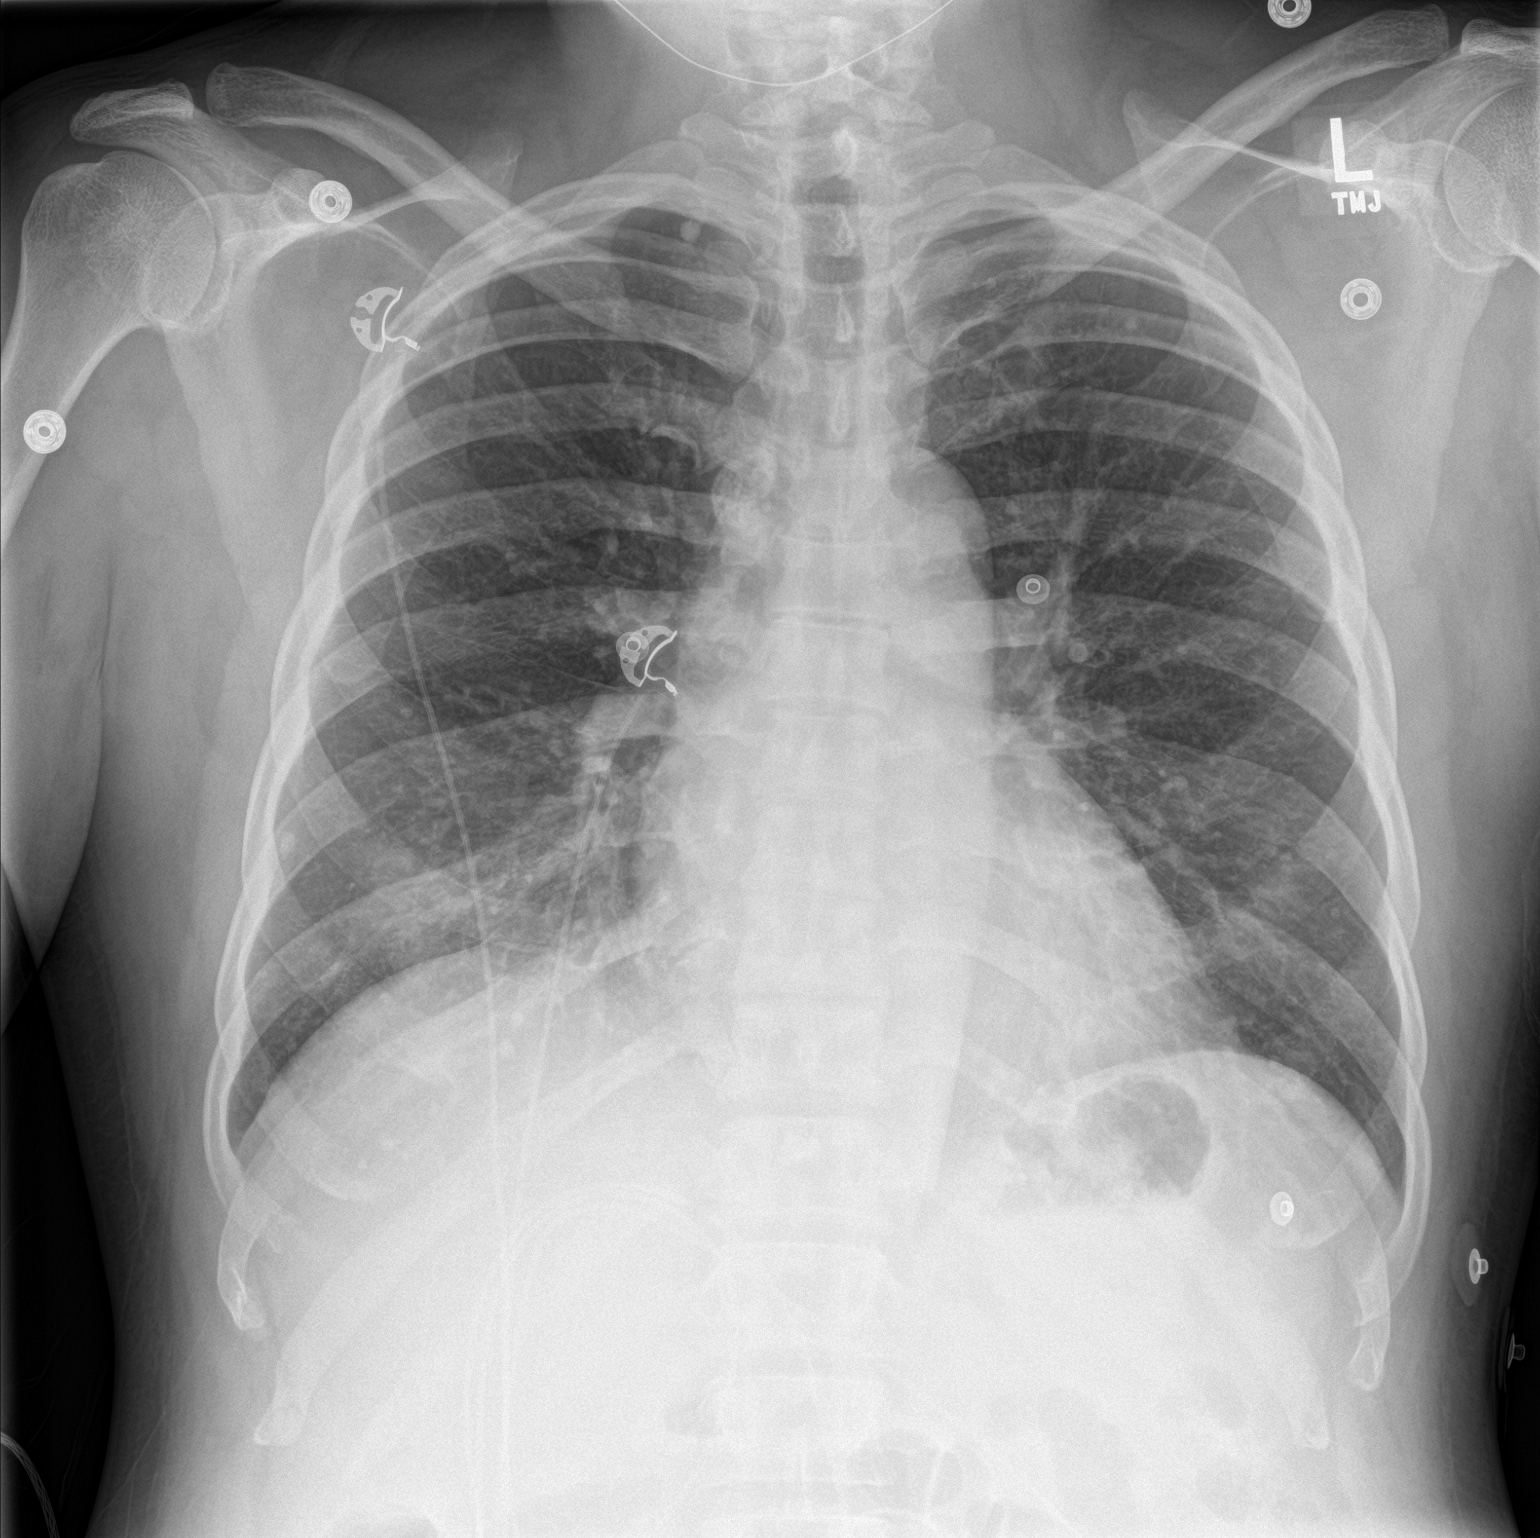

[chest lat]
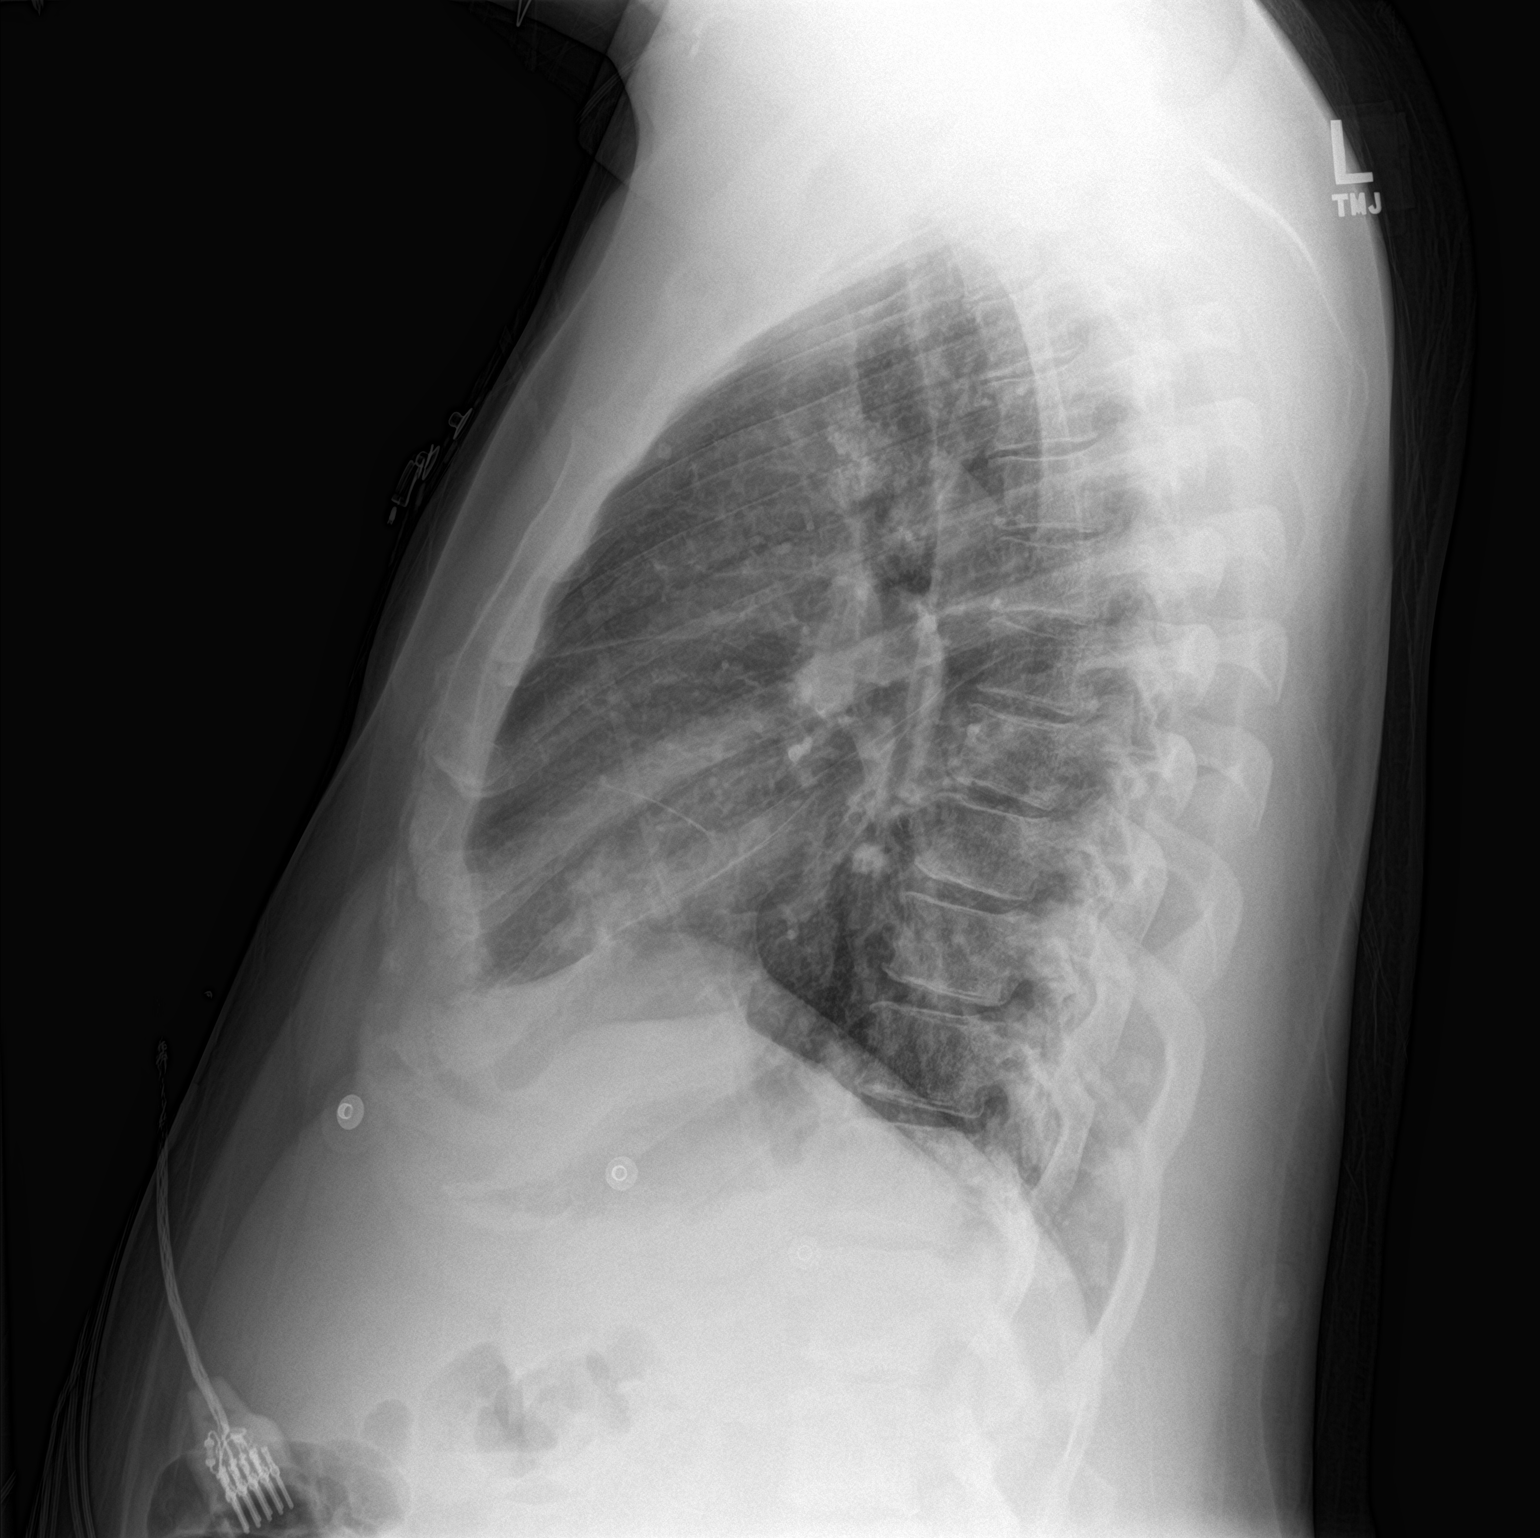

[2 of 2 positions shown; findings below may reference images not displayed]

FINDINGS: The heart size and mediastinal contours are within normal limits.
Both lungs are clear. The visualized skeletal structures are
unremarkable.
IMPRESSION: Patchy parenchymal density bilaterally worrisome for early
pneumonia. Followup PA and lateral chest X-ray is recommended in 3-4
weeks following trial of antibiotic therapy to ensure resolution and
exclude underlying malignancy.

Previous granulomatous infection.  No evidence of CHF.

## 2018-06-04 IMAGING — CR DG CHEST 2V
2 series · 2 of 2 positions shown · non-contrast
Comparison: Two days ago

CLINICAL DATA: Productive cough and shortness of breath

EXAM:
CHEST  2 VIEW

[chest pa]
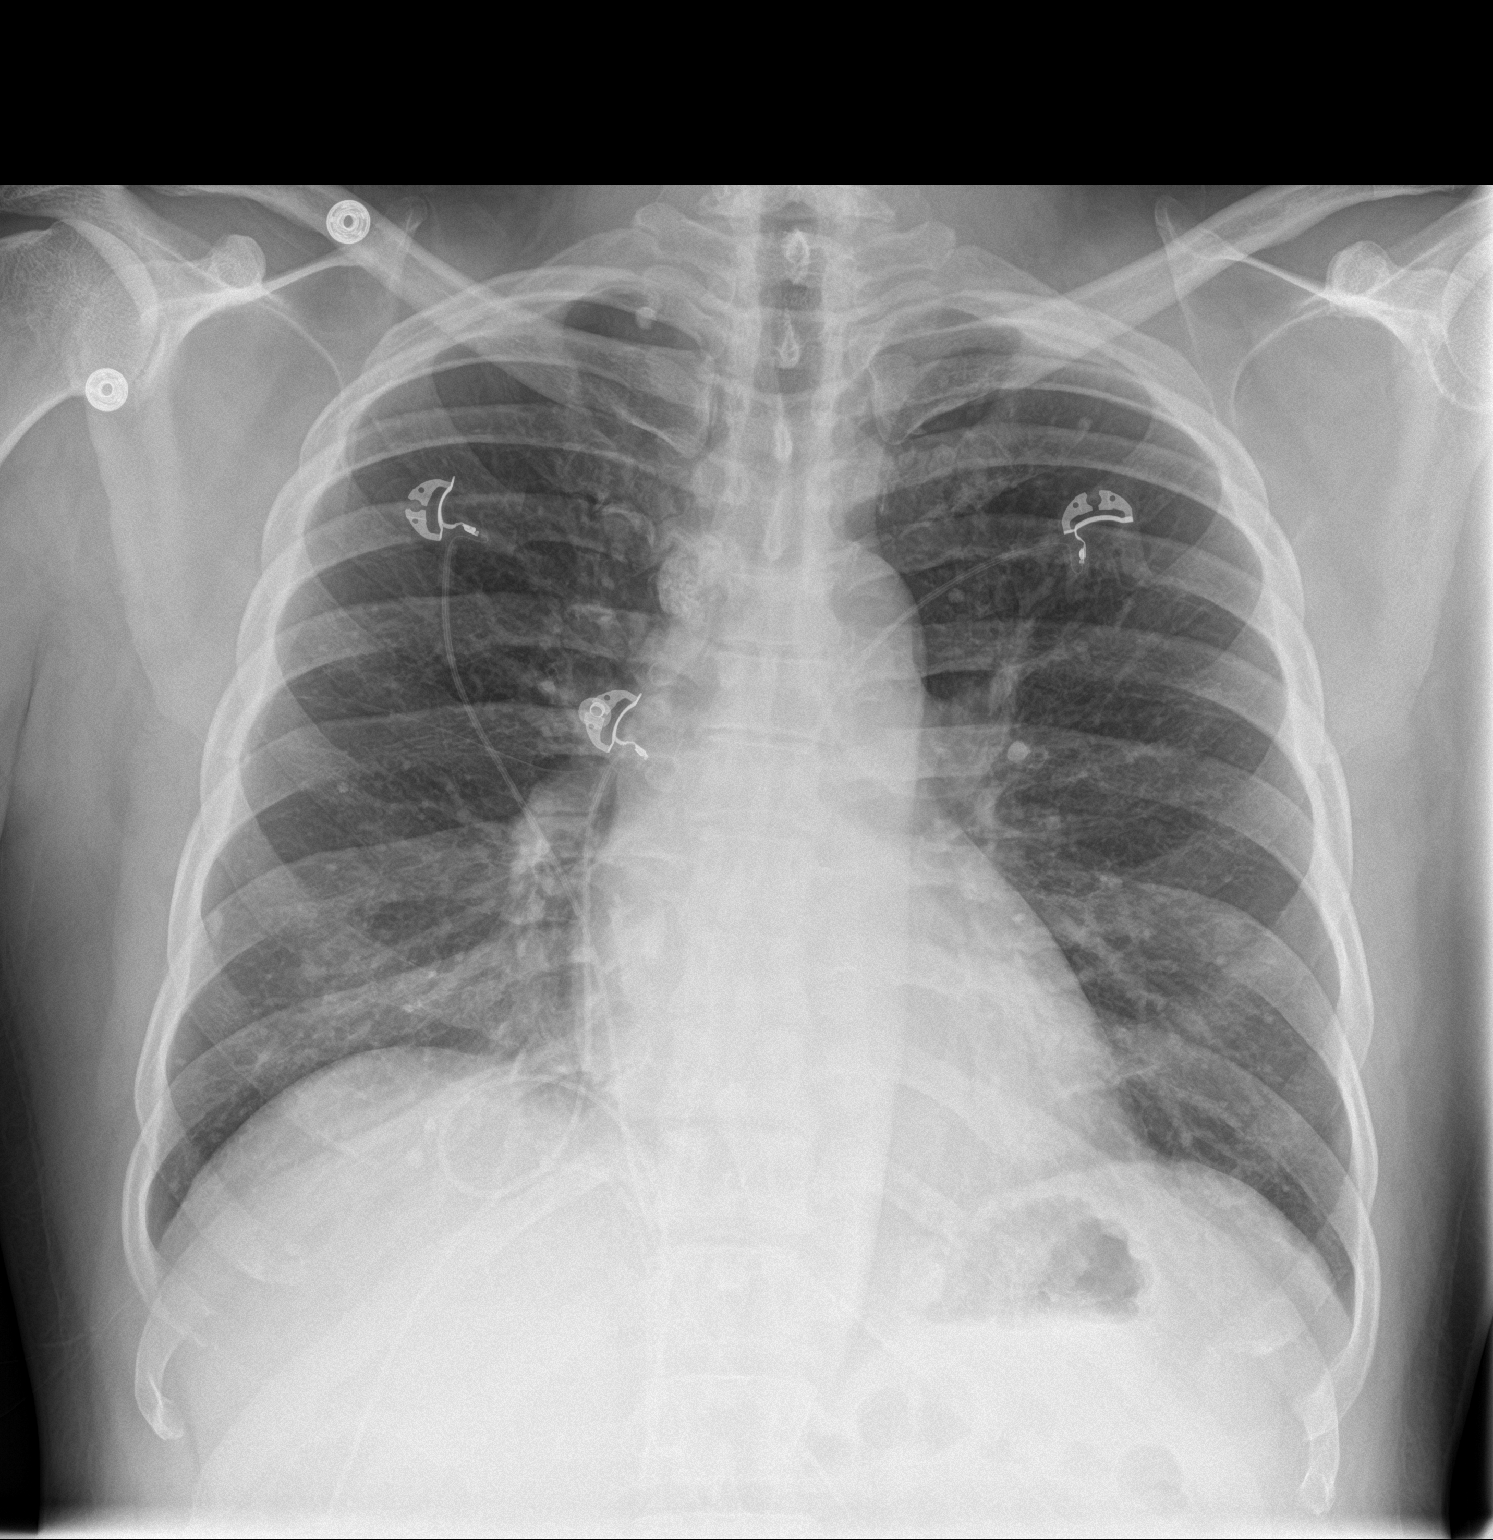

[chest lat]
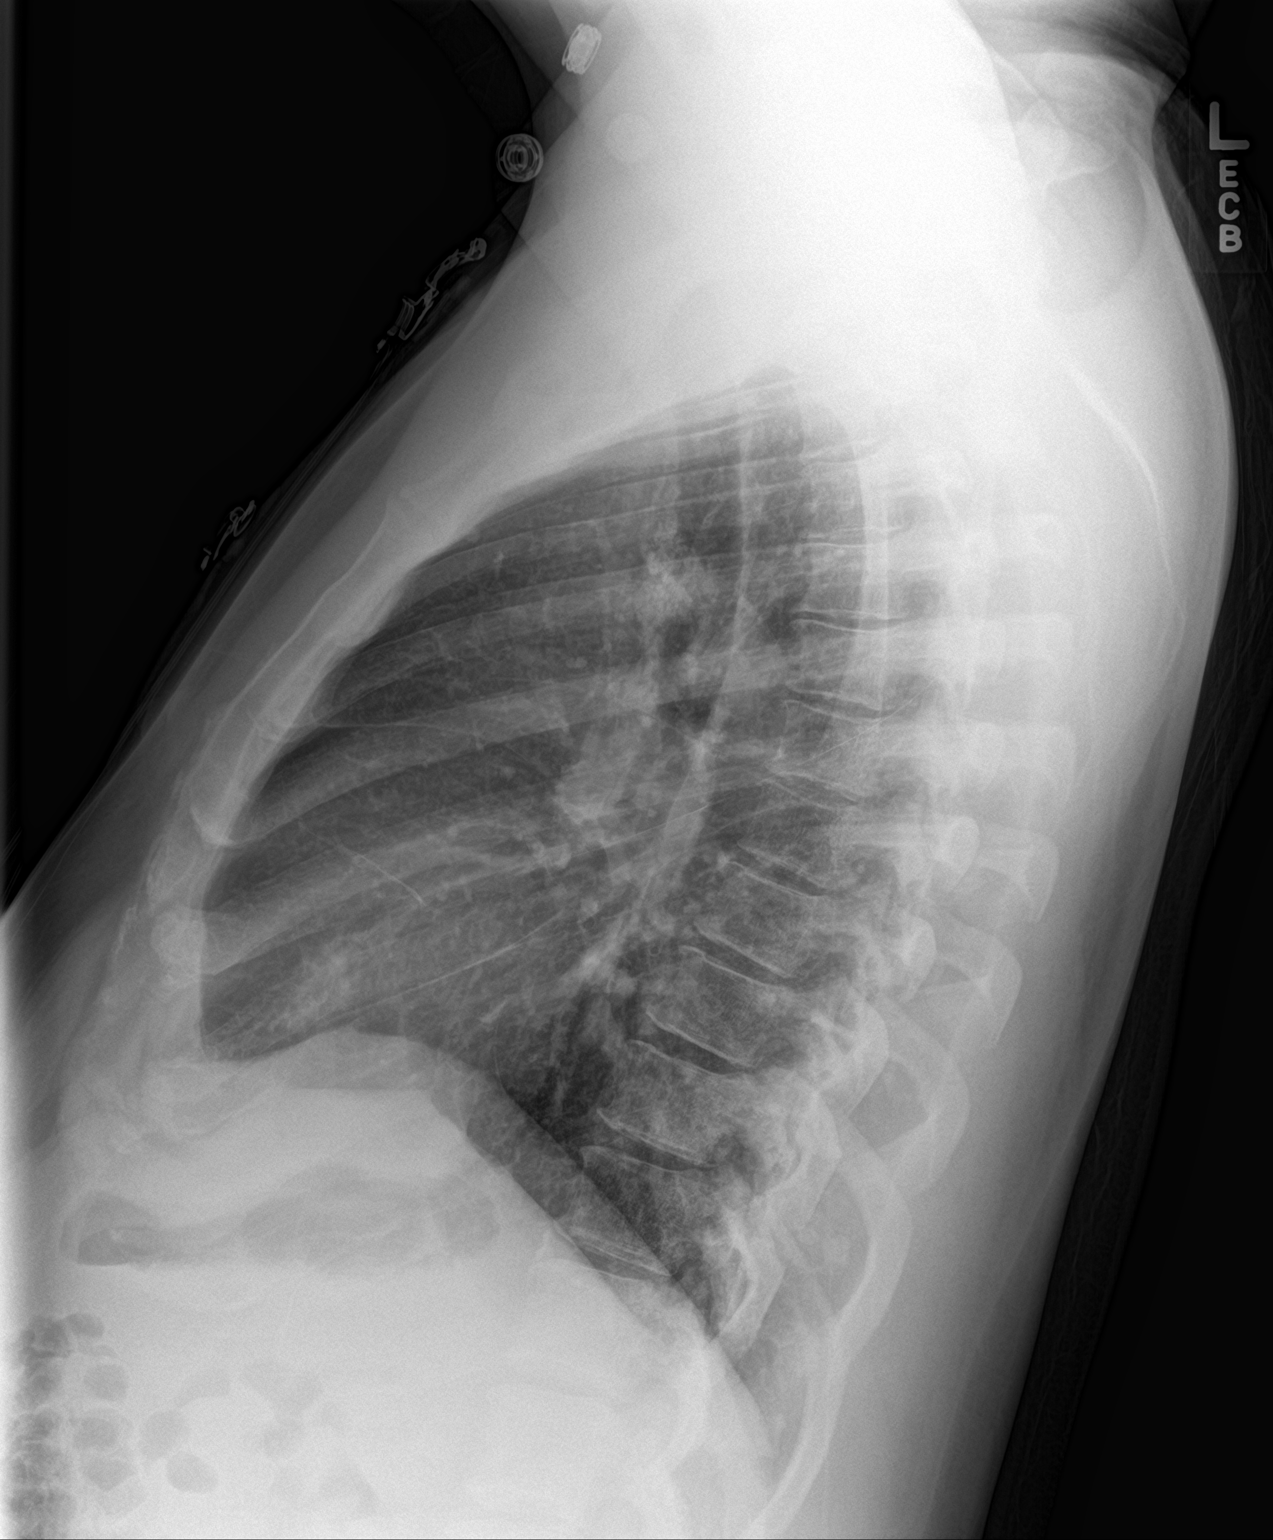

[2 of 2 positions shown; findings below may reference images not displayed]

FINDINGS: Improving basilar pneumonia, likely right middle lobe predominant.
Background of granulomatous pulmonary and nodal calcifications.
Normal heart size and mediastinal contours.
IMPRESSION: 1. Improving pneumonia.
2. Granulomatous calcifications.
